# Patient Record
Sex: Female | Born: 1950 | Race: White | Hispanic: No | Marital: Married | Smoking: Former smoker
Health system: Southern US, Community
[De-identification: ages and names within clinical notes are randomized; demographics above are authoritative.]

## PROBLEM LIST (undated history)

## (undated) DIAGNOSIS — F410 Panic disorder [episodic paroxysmal anxiety] without agoraphobia: Secondary | ICD-10-CM

## (undated) DIAGNOSIS — Z87442 Personal history of urinary calculi: Secondary | ICD-10-CM

## (undated) DIAGNOSIS — R609 Edema, unspecified: Secondary | ICD-10-CM

## (undated) DIAGNOSIS — F0390 Unspecified dementia without behavioral disturbance: Secondary | ICD-10-CM

## (undated) DIAGNOSIS — C50911 Malignant neoplasm of unspecified site of right female breast: Secondary | ICD-10-CM

## (undated) DIAGNOSIS — D649 Anemia, unspecified: Secondary | ICD-10-CM

## (undated) DIAGNOSIS — E785 Hyperlipidemia, unspecified: Secondary | ICD-10-CM

## (undated) DIAGNOSIS — F32A Depression, unspecified: Secondary | ICD-10-CM

## (undated) DIAGNOSIS — R413 Other amnesia: Secondary | ICD-10-CM

## (undated) DIAGNOSIS — B009 Herpesviral infection, unspecified: Secondary | ICD-10-CM

## (undated) DIAGNOSIS — E876 Hypokalemia: Secondary | ICD-10-CM

## (undated) DIAGNOSIS — M797 Fibromyalgia: Secondary | ICD-10-CM

## (undated) DIAGNOSIS — F419 Anxiety disorder, unspecified: Secondary | ICD-10-CM

## (undated) DIAGNOSIS — K439 Ventral hernia without obstruction or gangrene: Secondary | ICD-10-CM

## (undated) DIAGNOSIS — R739 Hyperglycemia, unspecified: Secondary | ICD-10-CM

## (undated) DIAGNOSIS — R0600 Dyspnea, unspecified: Secondary | ICD-10-CM

## (undated) DIAGNOSIS — F329 Major depressive disorder, single episode, unspecified: Secondary | ICD-10-CM

## (undated) DIAGNOSIS — F319 Bipolar disorder, unspecified: Secondary | ICD-10-CM

## (undated) DIAGNOSIS — M8580 Other specified disorders of bone density and structure, unspecified site: Secondary | ICD-10-CM

## (undated) DIAGNOSIS — K7689 Other specified diseases of liver: Secondary | ICD-10-CM

## (undated) DIAGNOSIS — F209 Schizophrenia, unspecified: Secondary | ICD-10-CM

## (undated) DIAGNOSIS — E039 Hypothyroidism, unspecified: Secondary | ICD-10-CM

## (undated) DIAGNOSIS — M1990 Unspecified osteoarthritis, unspecified site: Secondary | ICD-10-CM

## (undated) HISTORY — DX: Edema, unspecified: R60.9

## (undated) HISTORY — PX: ABDOMINAL HYSTERECTOMY: SHX81

## (undated) HISTORY — DX: Herpesviral infection, unspecified: B00.9

## (undated) HISTORY — DX: Hypothyroidism, unspecified: E03.9

## (undated) HISTORY — DX: Other specified disorders of bone density and structure, unspecified site: M85.80

## (undated) HISTORY — DX: Personal history of urinary calculi: Z87.442

## (undated) HISTORY — DX: Bipolar disorder, unspecified: F31.9

## (undated) HISTORY — DX: Anemia, unspecified: D64.9

## (undated) HISTORY — DX: Fibromyalgia: M79.7

## (undated) HISTORY — DX: Anxiety disorder, unspecified: F41.9

## (undated) HISTORY — DX: Ventral hernia without obstruction or gangrene: K43.9

## (undated) HISTORY — DX: Other specified diseases of liver: K76.89

## (undated) HISTORY — DX: Dyspnea, unspecified: R06.00

## (undated) HISTORY — DX: Major depressive disorder, single episode, unspecified: F32.9

## (undated) HISTORY — DX: Panic disorder (episodic paroxysmal anxiety): F41.0

## (undated) HISTORY — DX: Hyperlipidemia, unspecified: E78.5

## (undated) HISTORY — DX: Malignant neoplasm of unspecified site of right female breast: C50.911

## (undated) HISTORY — DX: Hyperglycemia, unspecified: R73.9

## (undated) HISTORY — PX: APPENDECTOMY: SHX54

## (undated) HISTORY — DX: Other amnesia: R41.3

## (undated) HISTORY — DX: Schizophrenia, unspecified: F20.9

## (undated) HISTORY — DX: Hypokalemia: E87.6

## (undated) HISTORY — PX: STOMACH SURGERY: SHX791

## (undated) HISTORY — DX: Depression, unspecified: F32.A

---

## 2006-07-01 ENCOUNTER — Emergency Department: Payer: Self-pay | Admitting: Emergency Medicine

## 2006-08-05 ENCOUNTER — Ambulatory Visit: Payer: Self-pay | Admitting: Gastroenterology

## 2007-05-16 ENCOUNTER — Other Ambulatory Visit: Payer: Self-pay

## 2007-05-17 ENCOUNTER — Inpatient Hospital Stay: Payer: Self-pay | Admitting: Internal Medicine

## 2007-07-30 ENCOUNTER — Emergency Department: Payer: Self-pay | Admitting: Emergency Medicine

## 2009-01-21 DIAGNOSIS — Z87442 Personal history of urinary calculi: Secondary | ICD-10-CM

## 2009-01-21 HISTORY — DX: Personal history of urinary calculi: Z87.442

## 2009-06-22 ENCOUNTER — Ambulatory Visit: Payer: Self-pay | Admitting: Specialist

## 2009-06-29 ENCOUNTER — Ambulatory Visit: Payer: Self-pay | Admitting: Surgery

## 2009-07-03 ENCOUNTER — Ambulatory Visit: Payer: Self-pay | Admitting: Surgery

## 2009-07-10 ENCOUNTER — Ambulatory Visit: Payer: Self-pay | Admitting: Surgery

## 2009-11-13 ENCOUNTER — Emergency Department: Payer: Self-pay | Admitting: Emergency Medicine

## 2011-10-29 ENCOUNTER — Emergency Department: Payer: Self-pay | Admitting: Emergency Medicine

## 2011-10-29 LAB — CBC
HGB: 13.9 g/dL (ref 12.0–16.0)
MCH: 31.5 pg (ref 26.0–34.0)
Platelet: 392 10*3/uL (ref 150–440)
RBC: 4.4 10*6/uL (ref 3.80–5.20)
WBC: 8.6 10*3/uL (ref 3.6–11.0)

## 2011-10-29 LAB — COMPREHENSIVE METABOLIC PANEL
Alkaline Phosphatase: 211 U/L — ABNORMAL HIGH (ref 50–136)
Anion Gap: 13 (ref 7–16)
BUN: 15 mg/dL (ref 7–18)
Bilirubin,Total: 0.5 mg/dL (ref 0.2–1.0)
Calcium, Total: 9.2 mg/dL (ref 8.5–10.1)
Chloride: 108 mmol/L — ABNORMAL HIGH (ref 98–107)
Co2: 20 mmol/L — ABNORMAL LOW (ref 21–32)
EGFR (African American): >60
EGFR (Non-African Amer.): 53 — ABNORMAL LOW
Osmolality: 282 (ref 275–301)
Potassium: 3.2 mmol/L — ABNORMAL LOW (ref 3.5–5.1)
SGOT(AST): 22 U/L (ref 15–37)
SGPT (ALT): 25 U/L (ref 12–78)
Total Protein: 8.5 g/dL — ABNORMAL HIGH (ref 6.4–8.2)

## 2011-10-29 LAB — PROTIME-INR
INR: 1
Prothrombin Time: 13.5 secs (ref 11.5–14.7)

## 2011-10-29 LAB — CK TOTAL AND CKMB (NOT AT ARMC): CK-MB: <0.5 ng/mL — ABNORMAL LOW (ref 0.5–3.6)

## 2012-03-14 ENCOUNTER — Emergency Department: Payer: Self-pay | Admitting: Internal Medicine

## 2013-01-21 DIAGNOSIS — K838 Other specified diseases of biliary tract: Secondary | ICD-10-CM

## 2013-01-21 HISTORY — DX: Other specified diseases of biliary tract: K83.8

## 2013-04-19 ENCOUNTER — Ambulatory Visit: Payer: Self-pay | Admitting: Internal Medicine

## 2013-04-19 LAB — CBC CANCER CENTER
Basophil #: 0.1 x10 3/mm (ref 0.0–0.1)
Basophil %: 1.1 %
EOS PCT: 2.9 %
Eosinophil #: 0.2 x10 3/mm (ref 0.0–0.7)
HCT: 37.4 % (ref 35.0–47.0)
HGB: 12.3 g/dL (ref 12.0–16.0)
LYMPHS ABS: 2.3 x10 3/mm (ref 1.0–3.6)
LYMPHS PCT: 29.7 %
MCH: 29.7 pg (ref 26.0–34.0)
MCHC: 32.9 g/dL (ref 32.0–36.0)
MCV: 90 fL (ref 80–100)
Monocyte #: 0.4 x10 3/mm (ref 0.2–0.9)
Monocyte %: 5.5 %
Neutrophil #: 4.7 x10 3/mm (ref 1.4–6.5)
Neutrophil %: 60.8 %
Platelet: 305 x10 3/mm (ref 150–440)
RBC: 4.15 10*6/uL (ref 3.80–5.20)
RDW: 13.1 % (ref 11.5–14.5)
WBC: 7.7 x10 3/mm (ref 3.6–11.0)

## 2013-04-19 LAB — IRON AND TIBC
IRON SATURATION: 21 %
Iron Bind.Cap.(Total): 201 ug/dL — ABNORMAL LOW (ref 250–450)
Iron: 42 ug/dL — ABNORMAL LOW (ref 50–170)
UNBOUND IRON-BIND. CAP.: 159 ug/dL

## 2013-04-19 LAB — HEPATIC FUNCTION PANEL A (ARMC)
ALBUMIN: 3.3 g/dL — AB (ref 3.4–5.0)
Alkaline Phosphatase: 140 U/L — ABNORMAL HIGH
BILIRUBIN DIRECT: 0.1 mg/dL (ref 0.00–0.20)
Bilirubin,Total: 0.3 mg/dL (ref 0.2–1.0)
SGOT(AST): 26 U/L (ref 15–37)
SGPT (ALT): 46 U/L (ref 12–78)
TOTAL PROTEIN: 7.4 g/dL (ref 6.4–8.2)

## 2013-04-19 LAB — FERRITIN: Ferritin (ARMC): 216 ng/mL (ref 8–388)

## 2013-04-21 ENCOUNTER — Ambulatory Visit: Payer: Self-pay | Admitting: Internal Medicine

## 2013-06-02 ENCOUNTER — Ambulatory Visit: Payer: Self-pay | Admitting: Internal Medicine

## 2013-06-02 LAB — HEPATIC FUNCTION PANEL A (ARMC)
ALBUMIN: 3.4 g/dL (ref 3.4–5.0)
ALK PHOS: 157 U/L — AB
Bilirubin, Direct: 0.1 mg/dL (ref 0.00–0.20)
Bilirubin,Total: 0.3 mg/dL (ref 0.2–1.0)
SGOT(AST): 18 U/L (ref 15–37)
SGPT (ALT): 23 U/L (ref 12–78)
Total Protein: 7.7 g/dL (ref 6.4–8.2)

## 2013-06-02 LAB — CBC CANCER CENTER
BASOS PCT: 1.3 %
Basophil #: 0.1 x10 3/mm (ref 0.0–0.1)
Eosinophil #: 0.4 x10 3/mm (ref 0.0–0.7)
Eosinophil %: 4.6 %
HCT: 37.9 % (ref 35.0–47.0)
HGB: 12.8 g/dL (ref 12.0–16.0)
LYMPHS ABS: 1.6 x10 3/mm (ref 1.0–3.6)
Lymphocyte %: 21.1 %
MCH: 30.7 pg (ref 26.0–34.0)
MCHC: 33.9 g/dL (ref 32.0–36.0)
MCV: 91 fL (ref 80–100)
MONO ABS: 0.3 x10 3/mm (ref 0.2–0.9)
Monocyte %: 3.7 %
Neutrophil #: 5.3 x10 3/mm (ref 1.4–6.5)
Neutrophil %: 69.3 %
PLATELETS: 344 x10 3/mm (ref 150–440)
RBC: 4.18 10*6/uL (ref 3.80–5.20)
RDW: 13.2 % (ref 11.5–14.5)
WBC: 7.7 x10 3/mm (ref 3.6–11.0)

## 2013-06-21 ENCOUNTER — Ambulatory Visit: Payer: Self-pay | Admitting: Internal Medicine

## 2013-09-28 ENCOUNTER — Ambulatory Visit: Payer: Self-pay | Admitting: Internal Medicine

## 2013-11-03 ENCOUNTER — Encounter: Payer: Self-pay | Admitting: Pulmonary Disease

## 2013-11-03 ENCOUNTER — Ambulatory Visit (INDEPENDENT_AMBULATORY_CARE_PROVIDER_SITE_OTHER): Payer: Medicare Other | Admitting: Pulmonary Disease

## 2013-11-03 VITALS — BP 142/74 | HR 74 | Ht 62.0 in | Wt 143.0 lb

## 2013-11-03 DIAGNOSIS — R0602 Shortness of breath: Secondary | ICD-10-CM

## 2013-11-03 DIAGNOSIS — R05 Cough: Secondary | ICD-10-CM

## 2013-11-03 DIAGNOSIS — R059 Cough, unspecified: Secondary | ICD-10-CM

## 2013-11-03 DIAGNOSIS — R1084 Generalized abdominal pain: Secondary | ICD-10-CM

## 2013-11-03 DIAGNOSIS — R1901 Right upper quadrant abdominal swelling, mass and lump: Secondary | ICD-10-CM

## 2013-11-03 DIAGNOSIS — R1900 Intra-abdominal and pelvic swelling, mass and lump, unspecified site: Secondary | ICD-10-CM | POA: Insufficient documentation

## 2013-11-03 DIAGNOSIS — R0600 Dyspnea, unspecified: Secondary | ICD-10-CM | POA: Insufficient documentation

## 2013-11-03 DIAGNOSIS — J984 Other disorders of lung: Secondary | ICD-10-CM | POA: Insufficient documentation

## 2013-11-03 LAB — BASIC METABOLIC PANEL
BUN: 17 mg/dL (ref 6–23)
CALCIUM: 9.1 mg/dL (ref 8.4–10.5)
CO2: 21 mEq/L (ref 19–32)
Chloride: 107 mEq/L (ref 96–112)
Creatinine, Ser: 1 mg/dL (ref 0.4–1.2)
GFR: 61.67 mL/min (ref >60.00)
GLUCOSE: 81 mg/dL (ref 70–99)
Potassium: 4.5 mEq/L (ref 3.5–5.1)
SODIUM: 137 meq/L (ref 135–145)

## 2013-11-03 MED ORDER — ALBUTEROL SULFATE HFA 108 (90 BASE) MCG/ACT IN AERS: 2.0000 | INHALATION_SPRAY | Freq: Four times a day (QID) | RESPIRATORY_TRACT | Status: DC | PRN

## 2013-11-03 NOTE — Assessment & Plan Note (Signed)
She had some mild tenderness in the right upper quadrant and there appeared to be a palpable mass. I reviewed previous images of her abdomen and there has been no prior evidence of clear hepatobiliary disease with the exception of some biliary ductal dilatation on a 2011 CT scan. Because of this I would like to evaluate further with a CT scan of her abdomen.

## 2013-11-03 NOTE — Assessment & Plan Note (Signed)
She describes 4-5 months of shortness of breath with minimal activity and the inability to "get air out". Objectively, she has normal lungs on physical exam, her ambulatory oximetry is normal and recently a chest x-ray was normal. However, she does have restrictive lung disease as seen on recent pulmonary function testing. I explained to her today that the differential diagnosis of restrictive lung diseases lengthy and to further evaluate this she needs a CT scan of her chest first.  However, because of her severe anxiety and feeling that she "cannot get air out" I have a high index of suspicion of vocal cord dysfunction. Less likely is the possibility of asthma.  Plan: -CT chest to evaluate for interstitial lung disease -When necessary albuterol.

## 2013-11-03 NOTE — Assessment & Plan Note (Signed)
Presumably due to GERD. Would like to evaluate for restrictive lung disease/interstitial lung disease as detailed above.  Plan: -Continue Prilosec for now, may need a higher dose

## 2013-11-03 NOTE — Progress Notes (Signed)
Subjective:    Patient ID: Elizabeth [name], female    DOB: 1950-07-10, 63 y.o.   MRN: [id number]  HPI  Elizabeth [name] is here to see me for shortness of breath.  She says taht she can't "breathe out" when she is walking.  She has brathing tests at her PCP's office and her heart checked out but she is still having trouble.    She smoked briefly at age 74, but none afterwards.    She says that this problem has been going on for 5 months now. It came on gradually.  She states that she has trouble going walking and going up and down steps without dyspnea. She feels like she has too much air in her chest and she can't breathe out.  She says that going outside makes the dyspnea worse.  She thinks when her dog is around she feels more short of breath.  She only is around the dog rarely and she can't really tell a direct correlation to exposure to the dog with the dyspnea.  She had endoscopy about two years ago and afterwards she had throat and breathing trouble.  She isn't clear about the details.  She lives in a house with asbestos material which she says is "covered up".  She cleans it frequently but it has some dust.  It does not have a basement or any water damage or mold.    She has had a lot of cough during this time too.  She sometimes produces phlegm which is white in color.  She never has any color in it.    She has never been told that she had a lung problem in the past.  She had a normal childhood without difficulty.  She has no family history of lung disease.    She has acid reflux which is not well controlled by her prilosec daily.  Past Medical History  Diagnosis Date  . Hypertension   . Memory loss   . Dyspnea   . Hypothyroidism   . Edema   . Hyperglycemia   . Osteopenia   . Hyperlipidemia   . Hypokalemia   . Anemia      Family History  Problem Relation Age of Onset  . Asthma Daughter   . Heart disease Father   . Cancer Paternal Grandmother     breast  . Cancer  Paternal Aunt     breast     History   Social History  . Marital Status: Married    Spouse Name: N/A    Number of Children: N/A  . Years of Education: N/A   Occupational History  . Not on file.   Social History Main Topics  . Smoking status: Former Smoker -- 0.50 packs/day for 2 years    Types: Cigarettes    Quit date: 11/03/1973  . Smokeless tobacco: Never Used     Comment: Smoked briefly as a teen- lives in secondhand smoke.   Marland Kitchen Alcohol Use: No  . Drug Use: Not on file  . Sexual Activity: Not on file   Other Topics Concern  . Not on file   Social History Narrative  . No narrative on file     Allergies  Allergen Reactions  . Penicillins     Hives      No outpatient prescriptions prior to visit.   No facility-administered medications prior to visit.      Review of Systems  Constitutional: Negative for fever and unexpected weight change.  HENT: Negative for congestion, dental problem, ear pain, nosebleeds, postnasal drip, rhinorrhea, sinus pressure, sneezing, sore throat and trouble swallowing.   Eyes: Negative for redness and itching.  Respiratory: Positive for cough, chest tightness, shortness of breath and wheezing.   Cardiovascular: Negative for palpitations and leg swelling.  Gastrointestinal: Negative for nausea and vomiting.  Genitourinary: Negative for dysuria.  Musculoskeletal: Negative for joint swelling.  Skin: Negative for rash.  Neurological: Negative for headaches.  Hematological: Does not bruise/bleed easily.  Psychiatric/Behavioral: Negative for dysphoric mood. The patient is not nervous/anxious.        Objective:   Physical Exam Filed Vitals:   11/03/13 1354  BP: 142/74  Pulse: 74  Height: 5\' 2"  (1.575 m)  Weight: 143 lb (64.864 kg)  SpO2: 99%   RA  Gen: , no acute distress HEENT: NCAT, PERRL, EOMi, OP clear, neck supple without masses PULM: CTA B CV: RRR, no mgr, no JVD AB: BS+, soft, nontender, ? hepatomegally vs RUQ to  epigastric mass Ext: warm, no edema, no clubbing, no cyanosis Derm: no rash or skin breakdown Neuro: A&Ox4, CN II-XII intact, strength 5/5 in all 4 extremities Psyche anxious, emotionally labile  September 2015 pulmonary function testing> ratio 79% predicted, FEV1 1 L (49% predicted, FVC 1.27 L (45% predicted), total lung capacity 2.93 L (65% predicted), DLCO not performed      Assessment & Plan:   Dyspnea She describes 4-5 months of shortness of breath with minimal activity and the inability to "get air out". Objectively, she has normal lungs on physical exam, her ambulatory oximetry is normal and recently a chest x-ray was normal. However, she does have restrictive lung disease as seen on recent pulmonary function testing. I explained to her today that the differential diagnosis of restrictive lung diseases lengthy and to further evaluate this she needs a CT scan of her chest first.  However, because of her severe anxiety and feeling that she "cannot get air out" I have a high index of suspicion of vocal cord dysfunction. Less likely is the possibility of asthma.  Plan: -CT chest to evaluate for interstitial lung disease -When necessary albuterol.   Restrictive lung disease See above  Abdominal mass She had some mild tenderness in the right upper quadrant and there appeared to be a palpable mass. I reviewed previous images of her abdomen and there has been no prior evidence of clear hepatobiliary disease with the exception of some biliary ductal dilatation on a 2011 CT scan. Because of this I would like to evaluate further with a CT scan of her abdomen.  Cough Presumably due to GERD. Would like to evaluate for restrictive lung disease/interstitial lung disease as detailed above.  Plan: -Continue Prilosec for now, may need a higher dose   Updated Medication List Outpatient Encounter Prescriptions as of 11/03/2013  Medication Sig  . citalopram (CELEXA) 20 MG tablet Take 20 mg by  mouth daily.  . clonazePAM (KLONOPIN) 1 MG tablet Take 1 mg by mouth 2 (two) times daily.  . ergocalciferol (VITAMIN D2) 50000 UNITS capsule Take 50,000 Units by mouth once a week.  . ferrous fumarate (HEMOCYTE - 106 MG FE) 325 (106 FE) MG TABS tablet Take 1 tablet by mouth daily.  Marland Kitchen levothyroxine (SYNTHROID, LEVOTHROID) 88 MCG tablet Take 88 mcg by mouth daily before breakfast.  . albuterol (PROVENTIL HFA;VENTOLIN HFA) 108 (90 BASE) MCG/ACT inhaler Inhale 2 puffs into the lungs every 6 (six) hours as needed for wheezing or shortness of breath.

## 2013-11-03 NOTE — Patient Instructions (Signed)
We will order a CT scan to see if your lungs are OK and a CT scan of your abdomen to evaluate the abdominal pain Try albuterol 2 puffs every four hours as needed for shortness of breath We will see you back in 2-4 weeks to go over the results

## 2013-11-03 NOTE — Assessment & Plan Note (Signed)
See above

## 2013-11-04 NOTE — Progress Notes (Signed)
Quick Note:  Spoke with pt, she's aware of results. Nothing further needed at this time. ______

## 2013-11-10 ENCOUNTER — Ambulatory Visit: Payer: Self-pay | Admitting: Pulmonary Disease

## 2013-11-10 ENCOUNTER — Telehealth: Payer: Self-pay | Admitting: Pulmonary Disease

## 2013-11-10 NOTE — Telephone Encounter (Signed)
Spoke with pt, advised her that I did not have this morning's ct chest results but that I would be calling her with them as soon as I have them. Had CT done at [facility].  Dr. Lake Bells please advise. Thank you.

## 2013-11-11 NOTE — Telephone Encounter (Signed)
Patient is asking for CT results.  [phone]

## 2013-11-11 NOTE — Telephone Encounter (Signed)
Spoke with pt, advised again that I do not have the results of the CT yet but I would send another message to BQ requesting the results and call her with them as soon as I have them.  Dr. Lake Bells please advise on pt's CT chest results.  Thank you.

## 2013-11-11 NOTE — Telephone Encounter (Signed)
Pt is wanting CT results & can be reached at [phone].  Satira Anis

## 2013-11-12 ENCOUNTER — Encounter: Payer: Self-pay | Admitting: Pulmonary Disease

## 2013-11-12 NOTE — Telephone Encounter (Signed)
Please let her know that the CT of the chest was fine but the CT of her abdomen showed that her bile ducts are dilated and she needs to discuss this with her PCP.  From what I can tell, the bile duct problem is no different than a previous study which is good.  There was no mass

## 2013-11-12 NOTE — Telephone Encounter (Signed)
Patient calling back again asking for CT results.  [phone]

## 2013-11-12 NOTE — Telephone Encounter (Signed)
Spoke with the pt and notified of recs per Dr Lake Bells  She verbalized understanding  Nothing further needed

## 2013-11-12 NOTE — Telephone Encounter (Signed)
Spoke with pt, explained again that I do not have the results yet for her CT but would send another message to BQ requesting them and contact her as soon as I have them.  She was confused as to why I couldn't just tell her the results, and I explained that [facility] is on a different computer system than our office is and that I do not have computer access to [facility] files.  Pt understands.    Dr. Lake Bells please advise on pt's ct chest results.  Thank you.

## 2013-11-15 LAB — CBC WITH DIFFERENTIAL/PLATELET
Basophil #: 0.1 10*3/uL (ref 0.0–0.1)
Basophil %: 1.1 %
Eosinophil #: 0.3 10*3/uL (ref 0.0–0.7)
Eosinophil %: 5.3 %
HCT: 39.7 % (ref 35.0–47.0)
HGB: 13.1 g/dL (ref 12.0–16.0)
LYMPHS ABS: 1.5 10*3/uL (ref 1.0–3.6)
Lymphocyte %: 24.7 %
MCH: 31.1 pg (ref 26.0–34.0)
MCHC: 33 g/dL (ref 32.0–36.0)
MCV: 95 fL (ref 80–100)
MONO ABS: 0.2 x10 3/mm (ref 0.2–0.9)
MONOS PCT: 3.8 %
NEUTROS PCT: 65.1 %
Neutrophil #: 3.9 10*3/uL (ref 1.4–6.5)
Platelet: 332 10*3/uL (ref 150–440)
RBC: 4.2 10*6/uL (ref 3.80–5.20)
RDW: 12.9 % (ref 11.5–14.5)
WBC: 5.9 10*3/uL (ref 3.6–11.0)

## 2013-11-15 LAB — COMPREHENSIVE METABOLIC PANEL
Albumin: 3.6 g/dL (ref 3.4–5.0)
Alkaline Phosphatase: 136 U/L — ABNORMAL HIGH
Anion Gap: 10 (ref 7–16)
BILIRUBIN TOTAL: 0.3 mg/dL (ref 0.2–1.0)
BUN: 11 mg/dL (ref 7–18)
CALCIUM: 8.9 mg/dL (ref 8.5–10.1)
CO2: 23 mmol/L (ref 21–32)
Chloride: 109 mmol/L — ABNORMAL HIGH (ref 98–107)
Creatinine: 1.07 mg/dL (ref 0.60–1.30)
EGFR (Non-African Amer.): 55 — ABNORMAL LOW
Glucose: 92 mg/dL (ref 65–99)
OSMOLALITY: 282 (ref 275–301)
Potassium: 4 mmol/L (ref 3.5–5.1)
SGOT(AST): 17 U/L (ref 15–37)
SGPT (ALT): 19 U/L
Sodium: 142 mmol/L (ref 136–145)
Total Protein: 8.1 g/dL (ref 6.4–8.2)

## 2013-11-15 LAB — URINALYSIS, COMPLETE
BACTERIA: NONE SEEN
Bilirubin,UR: NEGATIVE
GLUCOSE, UR: NEGATIVE mg/dL (ref 0–75)
Ketone: NEGATIVE
Nitrite: NEGATIVE
Ph: 7 (ref 4.5–8.0)
Protein: NEGATIVE
RBC,UR: 1 /HPF (ref 0–5)
Specific Gravity: 1.004 (ref 1.003–1.030)
Squamous Epithelial: <1
WBC UR: 6 /HPF (ref 0–5)

## 2013-11-15 LAB — LIPASE, BLOOD: Lipase: 145 U/L (ref 73–393)

## 2013-11-16 LAB — CBC WITH DIFFERENTIAL/PLATELET
BASOS ABS: 0.1 10*3/uL (ref 0.0–0.1)
Basophil %: 1.3 %
Eosinophil #: 0.4 10*3/uL (ref 0.0–0.7)
Eosinophil %: 5.8 %
HCT: 34.4 % — AB (ref 35.0–47.0)
HGB: 11.2 g/dL — ABNORMAL LOW (ref 12.0–16.0)
Lymphocyte #: 1.6 10*3/uL (ref 1.0–3.6)
Lymphocyte %: 24.3 %
MCH: 31.2 pg (ref 26.0–34.0)
MCHC: 32.6 g/dL (ref 32.0–36.0)
MCV: 96 fL (ref 80–100)
Monocyte #: 0.4 x10 3/mm (ref 0.2–0.9)
Monocyte %: 6.2 %
NEUTROS ABS: 4.1 10*3/uL (ref 1.4–6.5)
Neutrophil %: 62.4 %
Platelet: 284 10*3/uL (ref 150–440)
RBC: 3.59 10*6/uL — ABNORMAL LOW (ref 3.80–5.20)
RDW: 12.7 % (ref 11.5–14.5)
WBC: 6.7 10*3/uL (ref 3.6–11.0)

## 2013-11-16 LAB — COMPREHENSIVE METABOLIC PANEL
ALBUMIN: 2.7 g/dL — AB (ref 3.4–5.0)
ALK PHOS: 100 U/L
ALT: 12 U/L — AB
Anion Gap: 6 — ABNORMAL LOW (ref 7–16)
BILIRUBIN TOTAL: 0.4 mg/dL (ref 0.2–1.0)
BUN: 9 mg/dL (ref 7–18)
CALCIUM: 7.6 mg/dL — AB (ref 8.5–10.1)
CO2: 29 mmol/L (ref 21–32)
Chloride: 109 mmol/L — ABNORMAL HIGH (ref 98–107)
Creatinine: 1.18 mg/dL (ref 0.60–1.30)
EGFR (African American): 60 — ABNORMAL LOW
GFR CALC NON AF AMER: 49 — AB
Glucose: 98 mg/dL (ref 65–99)
Osmolality: 285 (ref 275–301)
Potassium: 4.1 mmol/L (ref 3.5–5.1)
SGOT(AST): 12 U/L — ABNORMAL LOW (ref 15–37)
Sodium: 144 mmol/L (ref 136–145)
TOTAL PROTEIN: 6.1 g/dL — AB (ref 6.4–8.2)

## 2013-11-17 HISTORY — PX: CHOLECYSTECTOMY OPEN: SUR202

## 2013-11-18 ENCOUNTER — Inpatient Hospital Stay: Payer: Self-pay | Admitting: Surgery

## 2013-11-18 LAB — COMPREHENSIVE METABOLIC PANEL
ALT: 54 U/L
ANION GAP: 10 (ref 7–16)
Albumin: 2.8 g/dL — ABNORMAL LOW (ref 3.4–5.0)
Alkaline Phosphatase: 96 U/L
BILIRUBIN TOTAL: 0.4 mg/dL (ref 0.2–1.0)
BUN: 10 mg/dL (ref 7–18)
CO2: 24 mmol/L (ref 21–32)
CREATININE: 1.05 mg/dL (ref 0.60–1.30)
Calcium, Total: 8.1 mg/dL — ABNORMAL LOW (ref 8.5–10.1)
Chloride: 104 mmol/L (ref 98–107)
EGFR (African American): >60
EGFR (Non-African Amer.): 56 — ABNORMAL LOW
Glucose: 206 mg/dL — ABNORMAL HIGH (ref 65–99)
OSMOLALITY: 281 (ref 275–301)
POTASSIUM: 4.2 mmol/L (ref 3.5–5.1)
SGOT(AST): 51 U/L — ABNORMAL HIGH (ref 15–37)
Sodium: 138 mmol/L (ref 136–145)
Total Protein: 6.7 g/dL (ref 6.4–8.2)

## 2013-11-18 LAB — CBC WITH DIFFERENTIAL/PLATELET
BASOS PCT: 0.1 %
Basophil #: 0 10*3/uL (ref 0.0–0.1)
EOS ABS: 0 10*3/uL (ref 0.0–0.7)
Eosinophil %: 0 %
HCT: 36 % (ref 35.0–47.0)
HGB: 11.7 g/dL — ABNORMAL LOW (ref 12.0–16.0)
Lymphocyte #: 0.6 10*3/uL — ABNORMAL LOW (ref 1.0–3.6)
Lymphocyte %: 3.5 %
MCH: 31 pg (ref 26.0–34.0)
MCHC: 32.6 g/dL (ref 32.0–36.0)
MCV: 95 fL (ref 80–100)
MONOS PCT: 4.4 %
Monocyte #: 0.8 x10 3/mm (ref 0.2–0.9)
NEUTROS ABS: 15.7 10*3/uL — AB (ref 1.4–6.5)
Neutrophil %: 92 %
PLATELETS: 297 10*3/uL (ref 150–440)
RBC: 3.78 10*6/uL — ABNORMAL LOW (ref 3.80–5.20)
RDW: 12.4 % (ref 11.5–14.5)
WBC: 17.1 10*3/uL — ABNORMAL HIGH (ref 3.6–11.0)

## 2013-11-20 LAB — CBC WITH DIFFERENTIAL/PLATELET
Basophil #: 0 10*3/uL (ref 0.0–0.1)
Basophil %: 0.2 %
EOS ABS: 0 10*3/uL (ref 0.0–0.7)
EOS PCT: 0.3 %
HCT: 29.4 % — AB (ref 35.0–47.0)
HGB: 9.8 g/dL — ABNORMAL LOW (ref 12.0–16.0)
LYMPHS PCT: 7.8 %
Lymphocyte #: 0.7 10*3/uL — ABNORMAL LOW (ref 1.0–3.6)
MCH: 31.3 pg (ref 26.0–34.0)
MCHC: 33.5 g/dL (ref 32.0–36.0)
MCV: 93 fL (ref 80–100)
Monocyte #: 0.7 x10 3/mm (ref 0.2–0.9)
Monocyte %: 7 %
NEUTROS ABS: 8.1 10*3/uL — AB (ref 1.4–6.5)
Neutrophil %: 84.7 %
PLATELETS: 299 10*3/uL (ref 150–440)
RBC: 3.14 10*6/uL — ABNORMAL LOW (ref 3.80–5.20)
RDW: 12.7 % (ref 11.5–14.5)
WBC: 9.6 10*3/uL (ref 3.6–11.0)

## 2013-11-20 LAB — BASIC METABOLIC PANEL
ANION GAP: 7 (ref 7–16)
BUN: 8 mg/dL (ref 7–18)
CALCIUM: 7.8 mg/dL — AB (ref 8.5–10.1)
CHLORIDE: 110 mmol/L — AB (ref 98–107)
Co2: 27 mmol/L (ref 21–32)
Creatinine: 0.91 mg/dL (ref 0.60–1.30)
EGFR (African American): >60
Glucose: 130 mg/dL — ABNORMAL HIGH (ref 65–99)
OSMOLALITY: 287 (ref 275–301)
Potassium: 4 mmol/L (ref 3.5–5.1)
SODIUM: 144 mmol/L (ref 136–145)

## 2013-11-26 ENCOUNTER — Encounter: Payer: Self-pay | Admitting: Pulmonary Disease

## 2013-12-21 DIAGNOSIS — K838 Other specified diseases of biliary tract: Secondary | ICD-10-CM | POA: Insufficient documentation

## 2014-01-21 DIAGNOSIS — D649 Anemia, unspecified: Secondary | ICD-10-CM

## 2014-01-21 DIAGNOSIS — K810 Acute cholecystitis: Secondary | ICD-10-CM

## 2014-01-21 HISTORY — DX: Anemia, unspecified: D64.9

## 2014-01-21 HISTORY — DX: Acute cholecystitis: K81.0

## 2014-04-20 ENCOUNTER — Telehealth: Payer: Self-pay | Admitting: Pulmonary Disease

## 2014-04-20 NOTE — Telephone Encounter (Signed)
Pt on recall list & note states pt to see BQ after CT. Is it ok to schedule appt now?

## 2014-04-20 NOTE — Telephone Encounter (Signed)
Patient had CT done 11/10/2013 but never called back to reschedule appointment. Ok to schedule. Thanks.

## 2014-04-22 NOTE — Telephone Encounter (Signed)
lmtcb x1 for pt. 

## 2014-04-22 NOTE — Telephone Encounter (Signed)
Pt called back.  Pt refused to schedule an appointment. Pt states this is not medically necessary and she is out of town at the moment.

## 2014-05-14 NOTE — Op Note (Signed)
PATIENT NAME:  Elizabeth [name], SKAFF MR#:  [id number] DATE OF BIRTH:  28-Sep-1950  DATE OF PROCEDURE:  11/17/2013   PREOPERATIVE DIAGNOSIS:  Acute calculus cholecystitis.   POSTOPERATIVE DIAGNOSIS:  Acute calculus cholecystitis.   PROCEDURE PERFORMED:  Open cholecystectomy with repair of bile duct.   SURGEON:  Travaughn Vue A. Marina Gravel, MD  ASSISTANT:  Lew Dawes. Oaks, MD   TYPE OF ANESTHESIA:  General endotracheal.   INDICATIONS:  This is a 64 year old white female who has had multiple previous abdominal operations. 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  She has a long right sided paramedian incision. Records are unavailable to me at this time. Workup has demonstrated chronically dilated ducts and a large duodenal diverticulum around the ampulla. HIDA scan performed demonstrates nonvisualization of the gallbladder. The patient has had continued pain and prior history of biliary colic. I discussed with her and her family extensively cholecystectomy with an attempted laparoscopic cholecystectomy. All of their questions were answered. They understand the risk of needing an open operation, the high likelihood of such, and the risk of bile duct injury and repair, bile duct leak, bleeding, and infection.   FINDINGS:  The gallbladder was markedly distended and filled with sludge and inspissated bile. The common bile duct was in a very aberrant location anterior to the duodenum. A small, 1 mm rent was discovered, likely secondary to a traction injury, which was closed with #5-0 PDS suture.   SPECIMENS:  Gallbladder with contents.   Drain:  19 mm blake in morrison's pouch.  DESCRIPTION OF PROCEDURE:  With informed consent, supine position, and general endotracheal anesthesia, the patient's abdomen was widely prepped utilizing ChloraPrep solution. Timeout was observed. Open technique was used to place a transversely-oriented skin incision through the  umbilicus with stay sutures being passed through the fascia. Pneumoperitoneum was established. There were dense adhesions of the omentum and stomach to the anterior abdominal wall. Two 5 mm trocars were placed in the left upper quadrant to take these adhesions down, and this was accomplished with sharp technique and point cautery on punctate bleeding. The gastric mid body was densely adherent to the anterior abdominal wall just underneath this right paramedian incision, which was taken down with sharp dissection and no evidence of gastrotomy. Further dissection demonstrated the gallbladder to be present in the right upper quadrant; however, dissection approximately 45 minutes into the operation failed to demonstrate any clear evidence of Hartmann pouch. Given her history of unclear anatomy and dilated ducts I felt further attempts at laparoscopic dissection unwise.  At this point an open operation was performed, and as such, a right upper quadrant Kocher incision was fashioned with a scalpel and electrocautery through musculofascial layers. A self-retaining abdominal retractor was placed. Two laparotomy tapes were then placed behind the right lobe of the liver. The gallbladder was taken down in a dome down fashion. The cystic artery was doubly ligated with silk suture. A cholangiogram was attempted, however, could only fill the duodenal diverticulum  cystic duct common bile duct junction was identified. There was filling into the duodenum. The cystic duct and common bile duct junction was identified. During the process of this, I discovered a small amount of leak on the anterior surface of the duodenum of clear bile, and this was inspected more closely. In fact, a large 1 cm bile duct was anterior to the duodenum in a very aberrant location, and a small, 1 mm defect was closed  with three 5-0 PDS sutures. The repair appeared to be intact. The cystic duct was transected utilizing a single fire of the 35 mm blue load  stapler and submitted as specimen.   The right upper quadrant was irrigated thoroughly, and hemostasis was ensured on the operative field, especially around the cystic duct remnant with a 3-0 silk suture in figure-of-eight orientation. Surgiflo 10 mL with thrombin application was inserted into the gallbladder fossa and cystic duct remnant. A small amount of fat from the stomach was placed over the bile duct repair and secured to the duodenum.   A 19 mm Blake drain was directed into Morison pouch and exited the right lower quadrant.   The infraumbilical fascial defect was reapproximated utilizing the existing stay sutures. Running #1 PDS looped was used in a single layer to reapproximate the fascial edges. Drain suture was secured with nylon suture. Skin edges were reapproximated utilizing a skin stapler. The remaining local anesthesia totaling 30 mL was infiltrated along all skin and fascial incisions prior to closure. The patient was subsequently extubated and taken to the recovery room in stable and satisfactory condition by anesthesia services.    ____________________________ Jeannette How Marina Gravel, MD mab:nb D: 11/18/2013 16:58:00 ET T: 11/19/2013 03:29:22 ET JOB#: [id number]  cc: Elta Guadeloupe A. Marina Gravel, MD, <Dictator> Hortencia Conradi MD ELECTRONICALLY SIGNED 11/21/2013 18:05

## 2014-05-14 NOTE — H&P (Signed)
Subjective/Chief Complaint abdominal pain, nausea   History of Present Illness 64 y/o female with bipolar disorder presents with 5 days of persistent RUQ pain, recent 30-40 lb weight loss which she reports as intentional.  CT scan 5 days ago showed chronically dilated intra- and extrahepatic bile ducts.  [place] today shows sludge and dilated bile ducts.  SO claims she had two small gallstones several years ago.   Past History bipolar d/o chronic constipation hysterectomy c section surgery for intestinal blockage at age 13 days, ? 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   Primary Physician Rosario Jacks.   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:   C-Section:   Hysterectomy - Total:   ALLERGIES:  Penicillin: Rash   Other Allergies none   HOME MEDICATIONS: Medication Instructions Status  atorvastatin 20 mg oral tablet 1 tab(s) orally once a day (at bedtime) Active  clonazepam 1 mg oral tablet 1 tab(s) orally 2 times a day Active  citalopram 20 mg oral tablet 1 tab(s) orally once a day Active  ferrous sulfate 325 mg oral delayed release tablet 1 tab(s) orally once a day Active  levothyroxine 88 mcg (0.088 mg) oral capsule 1 cap(s) orally once a day Active  Vitamin D3 1000 intl units oral tablet, chewable 1 tab(s) orally once a day Active   Family and Social History:  Family History Non-Contributory   Social History negative tobacco, negative ETOH, negative Illicit drugs   Place of Living Home   Review of Systems:  Subjective/Chief Complaint see above.   Abdominal Pain Yes   Nausea/Vomiting Yes   Dysuria No   Tolerating Diet No  Nauseated   Medications/Allergies Reviewed Medications/Allergies reviewed   Physical Exam:  GEN no acute distress, thin, disheveled   HEENT pale conjunctivae, PERRL, hearing intact to voice   NECK supple   RESP normal resp effort   clear BS   CARD regular rate   ABD denies tenderness  no liver/spleen enlargement  soft  multiple scars, no obvious hernia   LYMPH negative neck   EXTR negative cyanosis/clubbing   SKIN normal to palpation   NEURO cranial nerves intact   PSYCH good insight, anxious, agitated   Lab Results: Hepatic:  26-Oct-15 10:46   Bilirubin, Total 0.3  Alkaline Phosphatase  136 (46-116 NOTE: New Reference Range 08/10/13)  SGPT (ALT) 19 (14-63 NOTE: New Reference Range 08/10/13)  SGOT (AST) 17  Total Protein, Serum 8.1  Albumin, Serum 3.6  Routine Chem:  26-Oct-15 10:46   Glucose, Serum 92  BUN 11  Creatinine (comp) 1.07  Sodium, Serum 142  Potassium, Serum 4.0  Chloride, Serum  109  CO2, Serum 23  Calcium (Total), Serum 8.9  Osmolality (calc) 282  eGFR (African American) >60  eGFR (Non-African American)  55 (eGFR values <99m/min/1.73 m2 may be an indication of chronic kidney disease (CKD). Calculated eGFR, using the MRDR Study equation, is useful in  patients with stable renal function. The eGFR calculation will not be reliable in acutely ill patients when serum creatinine is changing rapidly. It is not useful in patients on dialysis. The eGFR calculation may not be applicable to patients at the low and high extremes of body sizes, pregnant women, and vetetarians.)  Anion Gap 10  Lipase 145 (Result(s) reported on 15 Nov 2013 at 11:18AM.)  Routine UA:  26-Oct-15 10:46   Color (UA) Straw  Clarity (UA) Clear  Glucose (UA) Negative  Bilirubin (UA) Negative  Ketones (UA) Negative  Specific Gravity (UA) 1.004  Blood (UA) 1+  pH (UA) 7.0  Protein (UA) Negative  Nitrite (UA) Negative  Leukocyte Esterase (UA) 1+ (Result(s) reported on 15 Nov 2013 at 11:27AM.)  RBC (UA) 1 /HPF  WBC (UA) 6 /HPF  Bacteria (UA) NONE SEEN  Epithelial Cells (UA) <1 /HPF (Result(s) reported on 15 Nov 2013 at 11:27AM.)  Routine Hem:  26-Oct-15 10:46   WBC (CBC) 5.9  RBC (CBC) 4.20   Hemoglobin (CBC) 13.1  Hematocrit (CBC) 39.7  Platelet Count (CBC) 332  MCV 95  MCH 31.1  MCHC 33.0  RDW 12.9  Neutrophil % 65.1  Lymphocyte % 24.7  Monocyte % 3.8  Eosinophil % 5.3  Basophil % 1.1  Neutrophil # 3.9  Lymphocyte # 1.5  Monocyte # 0.2  Eosinophil # 0.3  Basophil # 0.1 (Result(s) reported on 15 Nov 2013 at 11:30AM.)   Radiology Results: [place]:    26-Oct-15 13:41, US Abdomen Limited Survey  US Abdomen Limited Survey  REASON FOR EXAM:    ruq pain  COMMENTS:   Body Site: Gallbladder, Liver, Common Bile Duct    PROCEDURE: [place]  - US ABDOMEN LIMITED SURVEY  - Nov 15 2013  1:41PM     CLINICAL DATA:  Initial evaluation for excruciating right upper  quadrant pain for 5 days, 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 There is a positive  sonographic Murphy's sign. There is abundant non shadowing material  within the gallbladder.    Common bile duct:    Diameter: Common bile duct is between 11 and 16 mm. There are  dilated intrahepatic and extrahepatic bile ducts.    Liver:    There is intrahepatic biliary dilatation. There is a 3.2 cm simple  cyst in the right lobe of the liver.   IMPRESSION:  Positive sonographic Murphy sign with mild gallbladder wall  thickening and abundant sludge within the gallbladder lumen.  Findingsare concerning for cholecystitis.    Chronic dilatation of the extrahepatic and intrahepatic bile ducts  as increased when compared to prior studies. Obstructing lesion in  the region of the pancreatic head or distal common bile duct not  excluded. As suggested in the reported recent CT scan, MRCP may be  helpful.      Electronically Signed    By: Skipper Cliche M.D.    On: 11/15/2013 14:03     Verified By: Rachael Fee, M.D.,   LabUnknown:  PACS Image  CT:    21-Oct-15 10:59, CT Abdomen With Contrast  CT Abdomen With Contrast  REASON FOR EXAM:    SOB Dr requested CT chest WO Gen abd pain  COMMENTS:       PROCEDURE: CT  - CT ABDOMEN STANDARD W  - Nov 10 2013 10:59AM     CLINICAL DATA:  Shortness of breath, right upper quadrant abdominal  pain, and constipation x5 months. History of benign gastric tumor  resected years ago.    EXAM:  CT CHEST WITHOUT CONTRAST    CT ABDOMEN WITH CONTRAST    TECHNIQUE:  Multidetector CT imaging of the chest was performed following the  standard protocol without intravenous contrast. Multidetector CT  imaging of the abdomen was performed following the standard protocol  during bolus administration of intravenous contrast.    CONTRAST:  85 mL Isovue 300 IV    COMPARISON:  None.    FINDINGS:  CT CHEST FINDINGS    Lungs are clear. No suspicious pulmonary nodules. No pleural  effusion or pneumothorax.  Visualized thyroid is unremarkable.    Heart is normal in size. No pericardial effusion. Mild coronary  atherosclerosis in the LAD (series 2/image 31) and right coronary  artery (series 2/image 37).    No suspicious mediastinal or axillary lymphadenopathy.    Mild degenerative changes of the thoracic spine.    CT ABDOMEN FINDINGS    Hepatobiliary: Liver is notable for a 3.0 x 2.5 cm cyst inferiorly  in the posterior segment right hepatic lobe (series 2/ image 27).  Gallbladder is unremarkable. Mild intrahepatic ductal dilatation.  Dilated common duct, measuring 10 mm, which abruptly tapers at the  ampulla/distal CBD.    Pancreas: Within normal limits. No associated main pancreaticductal  dilatation.    Spleen: Within normal limits.    Adrenals/Urinary Tract: 13 x 12 mm right adrenal adenoma (series 2/  image 22). Left adrenal gland is within normal limits.    Bilateral kidneys are unremarkable.  No hydronephrosis.    Stomach/Bowel: Stomach is  unremarkable.  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    Visualized bowel is otherwise unremarkable.    Vascular/Lymphatic: No evidence of abdominal aortic aneurysm.    No suspicious abdominal lymphadenopathy.    Other: No abdominal ascites.    Musculoskeletal: Mild degenerative changes of the visualized lumbar  spine.     IMPRESSION:  Mild intrahepatic and extrahepatic ductal dilatation. Common duct  measures 10 mm and abruptly tapers at the ampulla. No obstructing  mass is visualized.    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    Correlate with LFTs; if abnormal, consider ERCP or MRCP to assess  for nonvisualized obstructing mass or distal CBD stricture or stone.  If normal, this appearance may be chronic or related to extrinsic  compression by the dominant duodenum diverticulum.    13 mm right adrenal adenoma, benign.    Age advanced coronary atherosclerosis.  Electronically Signed    By: Julian Hy M.D.    On: 11/10/2013 14:19         Verified By: Julian Hy, M.D.,    21-Oct-15 10:59, CT Chest Without Contrast  CT Chest Without Contrast  REASON FOR EXAM:    SOB Dr requested CT chest WO Gen abd pain  COMMENTS:       PROCEDURE: CT  - CT CHEST WITHOUT CONTRAST  - Nov 10 2013 10:59AM     CLINICAL DATA:  Shortness of breath, right upper quadrant abdominal  pain, and constipation x5 months. History of benign gastric tumor  resected years ago.    EXAM:  CT CHEST WITHOUT CONTRAST    CT ABDOMEN WITH CONTRAST    TECHNIQUE:  Multidetector CT imaging of the chest was performed following the  standard protocol without intravenous contrast. Multidetector CT  imaging of the abdomen was performed following the standard protocol  during bolus administration of intravenous contrast.    CONTRAST:  85 mL Isovue 300 IV    COMPARISON:  None.    FINDINGS:  CT CHEST FINDINGS    Lungs are clear.  No suspicious pulmonary nodules. No pleural  effusion or pneumothorax.  Visualized thyroid is unremarkable.    Heart is normal in size. No pericardial effusion. Mild coronary  atherosclerosis in the LAD (series 2/image 31) and right coronary  artery (series 2/image 37).    No suspicious mediastinal or axillary lymphadenopathy.    Mild degenerative changes of the thoracic spine.    CT ABDOMEN FINDINGS    Hepatobiliary: Liver is notable for a 3.0 x 2.5 cm cyst inferiorly  in the posterior segment right hepatic lobe (series 2/ image 27).  Gallbladder is unremarkable. Mild intrahepatic ductal dilatation.  Dilated common duct, measuring 10 mm, which abruptly tapers at the  ampulla/distal CBD.    Pancreas: Within normal limits. No associated main pancreatic ductal  dilatation.    Spleen: Within normal limits.    Adrenals/Urinary Tract: 13 x 12 mm right adrenal adenoma (series 2/  image 22). Left adrenal gland is within normal limits.    Bilateral kidneys are unremarkable.  No hydronephrosis.    Stomach/Bowel: Stomach is unremarkable.  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    Visualized bowel is otherwise unremarkable.    Vascular/Lymphatic: No evidence of abdominal aortic aneurysm.    No suspicious abdominallymphadenopathy.    Other: No abdominal ascites.    Musculoskeletal: Mild degenerative changes of the visualized lumbar  spine.     IMPRESSION:  Mild intrahepatic and extrahepatic ductal dilatation. Common duct  measures 10 mm and abruptly tapers at the ampulla. No obstructing  mass is visualized.    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    Correlate with LFTs; if abnormal, consider ERCP or MRCP to assess  for nonvisualized obstructing mass or distal CBD stricture or stone.  If normal, this appearance may be chronic or related to extrinsic  compression by the dominant duodenum diverticulum.    13 mm  right adrenal adenoma, benign.    Age advanced coronary atherosclerosis.  Electronically Signed    By: Julian Hy M.D.    On: 11/10/2013 14:19         Verified By: Julian Hy, M.D.,    Assessment/Admission Diagnosis 64 y/o female with RUQ pain, nausea and vomiting for 5 days. normal wbc and lft's chronically dilated bile ducts, likely benign etiology given time course. significant component of anxiety she says she does not want to have surgery unless "somebody knows what they are doing"   Plan Admit, hydrate IV abx HIDA scan and MRCP,  GI consult.   Electronic Signatures: Sherri Rad (MD)  (Signed 26-Oct-15 16:36)  Authored: CHIEF COMPLAINT and HISTORY, PAST MEDICAL/SURGIAL HISTORY, ALLERGIES, Other Allergies, HOME MEDICATIONS, FAMILY AND SOCIAL HISTORY, REVIEW OF SYSTEMS, PHYSICAL EXAM, LABS, Radiology, ASSESSMENT AND PLAN   Last Updated: 26-Oct-15 16:36 by Sherri Rad (MD)

## 2014-05-14 NOTE — Consult Note (Signed)
PATIENT NAME:  BRENDAN, GRUWELL MR#:  [id number] DATE OF BIRTH:  29-May-1950  DATE OF CONSULTATION:  11/16/2013  REFERRING PHYSICIAN:  Sherri Rad, MD CONSULTING PHYSICIAN:  Arther Dames, MD  REASON FOR CONSULTATION: Dilated common bile duct, right upper quadrant pain.  HISTORY OF PRESENT ILLNESS: Ms. Helbig is a 64 year old female with a past medical history notable for anxiety, fibromyalgia and diabetes who presented to the Emergency Room for evaluation of right upper quadrant and epigastric pain. Ms. [place] reports that over the past months to years she has had trouble with intermittent episodes of right upper quadrant pain. These episodes usually occur after eating greasy foods and tend to last for about 30 minutes.   However, this episode that brought her to the Emergency Room was much more severe and persistent than her prior episodes. The pain was not associated with nausea, vomiting, fevers, or chills and did not radiate to her back.   She has not had issues with the pain since she has been in the hospital. Her work-up so far included a CT scan that showed gallbladder wall thickening and also a dilated intra and extrahepatic bile duct with a diverticulum in the small bowel near the ampulla. She also had an ultrasound that also again shows intra and extrahepatic biliary dilation. Today she had a HIDA scan which shows nonvisualization of the gallbladder.   PAST MEDICAL HISTORY: 1.  Bipolar.  2.  Chronic constipation.  3.  Hysterectomy.  4.  Anxiety. 5.  Fibromyalgia. 6.  Diabetes.   PAST SURGICAL HISTORY: Includes: 1.  Hysterectomy.  2.  C-section.  3.  Surgery for intestinal blockage at age 32 days.     ALLERGIES: PENICILLIN.   HOME MEDICATIONS: 1.  Lipitor 20 daily.  2.  Clonazepam 1 mg twice a day.  3.  Citalopram 20 mg daily.  4.  Iron 325 daily.  5.  Levothyroxine 88 mcg daily.  6.  Vitamin D3.  FAMILY HISTORY: No family history of GI malignancy that she is aware of.    SOCIAL HISTORY: She denies tobacco or alcohol to me.   REVIEW OF SYSTEMS: A 10 system review was conducted. It is negative except as stated in the HPI.  PHYSICAL EXAMINATION: VITAL SIGNS: Her temperature is 98.3, pulse is 91, respirations are 17, blood pressure is 108/63 and pulse ox is 95% on room air.  GENERAL: Alert and oriented times 4.  No acute distress. Appears stated age. HEENT: Normocephalic/atraumatic. Extraocular movements are intact. Anicteric. NECK: Soft, supple. JVP appears normal. No adenopathy. CHEST: Clear to auscultation. No wheeze or crackle. Respirations unlabored. HEART: Regular. No murmur, rub, or gallop.  Normal S1 and S2. ABDOMEN: Soft. Significant right upper quadrant tenderness to palpation. Nondistended.  Normal active bowel sounds in all 4 quadrants.  No organomegaly. No masses EXTREMITIES: No swelling, well perfused. SKIN: No rash or lesion. Skin color, texture, turgor normal. NEUROLOGICAL: Grossly intact. PSYCHIATRIC: Normal tone and affect. MUSCULOSKELETAL: No joint swelling or erythema.   DIAGNOSTIC DATA: Sodium 144, potassium 4.1, creatinine 1.18, BUN 9. Lipase normal at 145. Liver enzymes are normal. Albumin is 2.7. Yesterday her alk phos was slightly elevated at 136. CBC: White count 6.7, hemoglobin 11.2, hematocrit 34, platelets  284,000.   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  She also had an ultrasound of the abdomen that shows chronic dilation of the extrahepatic and intrahepatic bile ducts.  She had a HIDA scan today which showed nonvisualization of the gallbladder with prompt excretion of bile into the small bowel.   ASSESSMENT AND PLAN: Right upper quadrant pain, dilated bile ducts: Based on today's HIDA scan, it seems that she may well  have cholecystitis. She also does have dilated intra and extrahepatic dilation with abrupt tapering at the ampulla along with a large diverticulum. I suspect the biliary dilation may be related to some sphincter of Odie dysfunction related to the diverticulum. It does appear that the flow of bile was normal into the bile duct on HIDA scan. I would recommended a MRCP to further investigate and rule out a potential cause of the bile duct dilation and the tapering at the ampulla.  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 Therefore, depending on the MRCP, we may consider ERCP with manometry.   RECOMMENDATIONS: 1.  Management of possible cholecystitis per surgical team.  2.  MRCP to evaluate dilated common bile duct with tapering at the ampulla.  3.  Further recommendations pending the findings on the MRCP and response to management of possible cholecystitis.   Thank you for this consult.   ____________________________ Arther Dames, MD mr:sb D: 11/16/2013 11:51:48 ET T: 11/16/2013 12:23:30 ET JOB#: [id number]  cc: Arther Dames, MD, <Dictator> Mellody Life MD ELECTRONICALLY SIGNED 12/14/2013 15:31

## 2014-05-14 NOTE — Discharge Summary (Signed)
PATIENT NAME:  Elizabeth [name], TIESZEN MR#:  [id number] DATE OF BIRTH:  [birth year]  DATE OF ADMISSION:  11/15/2013 DATE OF DISCHARGE:  11/21/2013  FINAL DIAGNOSIS: Acute cholecystitis.   PRINCIPAL PROCEDURES:  1.  Open cholecystectomy.  2.  HIDA scan.  3.  CT scan of the abdomen.  4.  GI medicine consultation.  5.  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  6.  Duodenal diverticulum. 7.  MRCP.   HOSPITAL COURSE SUMMARY: The patient was admitted with abdominal pain and an unclear picture of acute cholecystitis. 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 She has had multiple previous operations. She was found on CT scan 5 days prior to her admission to have dilated intrahepatic and extrahepatic biliary ducts and an ultrasound on the day of her admission demonstrated sludge and dilated ducts as well. She been told that she has had gallstones in the past. She has a history of chronic constipation and chronic intermittent abdominal pain. The patient was admitted to the hospital. GI medicine did become involved due to her dilated ducts. Recommendation was for outpatient follow-up and MRCP. An MRCP was performed which did demonstrate tapering of the distal duct, but no obvious choledocholithiasis. There was found to be a large duodenal diverticulum, which may account for the abrupt cut off of the ducts seen distally. Similar findings were noted from a 2011 MRCP, which were essentially identical. The patient underwent a HIDA scan. HIDA scan demonstrated nonvisualization of the gallbladder. She was taken to the operating room on the 28th of October with an intent for cholecystectomy and intraoperative cholangiography. Due to multiple adhesions in the right upper quadrant and altered hepatoduodenal anatomy, the patient had an open procedure. Postoperatively, she did well  with adequate pain control. Her drain was left in place and had no bile. The patient was tolerating a regular diet and was transitioned over to oral narcotics. Wound was healing nicely. No bile was seen in the drainage. She was subsequently discharged home on the 1st of November with outpatient follow-up in my office approximately 1 week. Medication reconciliation form was performed. I will see the patient back in the office. Call with any questions or concerns.   ____________________________ Jeannette How Marina Gravel, MD mab:sb D: 12/06/2013 10:35:00 ET T: 12/06/2013 11:40:02 ET JOB#: [id number]  cc: Elta Guadeloupe A. Marina Gravel, MD, <Dictator> Hortencia Conradi MD ELECTRONICALLY SIGNED 12/06/2013 19:36

## 2014-05-16 LAB — SURGICAL PATHOLOGY

## 2014-08-04 ENCOUNTER — Telehealth: Payer: Self-pay | Admitting: Gastroenterology

## 2014-08-04 NOTE — Telephone Encounter (Signed)
Colonoscopy triage referral in media

## 2014-08-09 NOTE — Telephone Encounter (Signed)
LVM for pt to return my call.

## 2014-08-18 ENCOUNTER — Encounter: Payer: Self-pay | Admitting: Urology

## 2014-08-18 ENCOUNTER — Ambulatory Visit (INDEPENDENT_AMBULATORY_CARE_PROVIDER_SITE_OTHER): Payer: Medicare Other | Admitting: Urology

## 2014-08-18 VITALS — BP 110/74 | HR 90 | Ht 62.0 in | Wt 144.3 lb

## 2014-08-18 DIAGNOSIS — D3501 Benign neoplasm of right adrenal gland: Secondary | ICD-10-CM | POA: Insufficient documentation

## 2014-08-18 DIAGNOSIS — R312 Other microscopic hematuria: Secondary | ICD-10-CM

## 2014-08-18 DIAGNOSIS — R3129 Other microscopic hematuria: Secondary | ICD-10-CM | POA: Insufficient documentation

## 2014-08-18 LAB — URINALYSIS, COMPLETE
Bilirubin, UA: NEGATIVE
GLUCOSE, UA: NEGATIVE
KETONES UA: NEGATIVE
LEUKOCYTES UA: NEGATIVE
Nitrite, UA: NEGATIVE
PROTEIN UA: NEGATIVE
Urobilinogen, Ur: 0.2 mg/dL (ref 0.2–1.0)
pH, UA: 5.5 (ref 5.0–7.5)

## 2014-08-18 LAB — MICROSCOPIC EXAMINATION
Bacteria, UA: NONE SEEN
WBC, UA: NONE SEEN /hpf (ref 0–?)

## 2014-08-18 NOTE — Progress Notes (Signed)
08/18/2014 4:38 PM   [place] 06/09/1950 [id number]  Referring provider: Casilda Carls, MD [address]   [place], [place] [address]  Chief Complaint  Patient presents with  . Hematuria    microscopic hematuria, left side intermittent abdominal pain     HPI: 64 year old female sent by Dr. Casilda Carls for evaluation and management of hematuria. She was recently found to have dipstick positive hematuria on routine urinalysis. The patient had been having some left side pain, mainly with moving around. She has no pain when still. It is not colicky in nature. She denies any gross hematuria. She did have a kidney stone about 5 years ago by report she had a CT scan in October 2015 prior to a cholecystectomy which revealed no renal abnormalities.  She denies frequent urinary tract infections. She has no significant lower urinary tract symptomatology. She has a good stream and feels like she empties well. She has not sought urologic consultation in the past.  PMH: Past Medical History  Diagnosis Date  . Hypertension   . Memory loss   . Dyspnea   . Hypothyroidism   . Edema   . Hyperglycemia   . Osteopenia   . Hyperlipidemia   . Hypokalemia   . Anemia   . History of kidney stones 2011    Surgical History: Past Surgical History  Procedure Laterality Date  . Cesarean section    . Stomach surgery    . Abdominal hysterectomy      Home Medications:    Medication List       This list is accurate as of: 08/18/14  4:38 PM.  Always use your most recent med list.               albuterol 108 (90 BASE) MCG/ACT inhaler  Commonly known as:  PROVENTIL HFA;VENTOLIN HFA  Inhale 2 puffs into the lungs every 6 (six) hours as needed for wheezing or shortness of breath.     PROAIR HFA 108 (90 BASE) MCG/ACT inhaler  Generic drug:  albuterol  Inhale into the lungs.     calcium carbonate 1250 (500 CA) MG tablet  Commonly known as:  OS-CAL - dosed in mg of elemental calcium   Take by mouth.     citalopram 20 MG tablet  Commonly known as:  CELEXA  Take by mouth.     clonazePAM 1 MG tablet  Commonly known as:  KLONOPIN  Take 1 mg by mouth 2 (two) times daily.     dicyclomine 10 MG capsule  Commonly known as:  BENTYL  Take by mouth.     Ferrous Fumarate 324 (106 FE) MG Tabs  Take by mouth.     levothyroxine 88 MCG tablet  Commonly known as:  SYNTHROID, LEVOTHROID  Take by mouth.     lubiprostone 24 MCG capsule  Commonly known as:  AMITIZA  Take by mouth.     omeprazole 40 MG capsule  Commonly known as:  PRILOSEC  Take by mouth.     polyethylene glycol packet  Commonly known as:  MIRALAX / GLYCOLAX  Take by mouth.     PROBIOTIC ADVANCED PO  Take by mouth.     Vitamin D (Ergocalciferol) 50000 UNITS Caps capsule  Commonly known as:  DRISDOL  Take by mouth.        Allergies:  Allergies  Allergen Reactions  . Oxycodone-Acetaminophen Hives  . Penicillins Hives    Hives Hives     Family History: Family  History  Problem Relation Age of Onset  . Asthma Daughter   . Heart disease Father   . Cancer Paternal Grandmother     breast  . Cancer Paternal Aunt     breast    Social History:  reports that she quit smoking about 40 years ago. Her smoking use included Cigarettes. She has a 1 pack-year smoking history. She has never used smokeless tobacco. She reports that she does not drink alcohol or use illicit drugs.  ROS: UROLOGY Frequent Urination?: No Hard to postpone urination?: No Burning/pain with urination?: No Get up at night to urinate?: Yes Leakage of urine?: No Urine stream starts and stops?: No Trouble starting stream?: No Do you have to strain to urinate?: Yes Blood in urine?: No Urinary tract infection?: No Sexually transmitted disease?: No Injury to kidneys or bladder?: No Painful intercourse?: No Weak stream?: No Currently pregnant?: No Vaginal bleeding?: No Last menstrual period?:  n  Gastrointestinal Nausea?: No Vomiting?: No Indigestion/heartburn?: Yes Diarrhea?: Yes Constipation?: No  Constitutional Fever: No Night sweats?: No Weight loss?: No Fatigue?: Yes  Skin Skin rash/lesions?: No Itching?: No  Eyes Blurred vision?: No Double vision?: No  Ears/Nose/Throat Sore throat?: No Sinus problems?: No  Hematologic/Lymphatic Swollen glands?: Yes Easy bruising?: Yes  Cardiovascular Leg swelling?: Yes  Respiratory Cough?: Yes Shortness of breath?: Yes  Endocrine Excessive thirst?: Yes  Musculoskeletal Back pain?: No Joint pain?: No  Neurological Headaches?: Yes Dizziness?: No  Psychologic Depression?: Yes Anxiety?: Yes  Physical Exam: BP 110/74 mmHg  Pulse 90  Ht 5\' 2"  (1.575 m)  Wt 144 lb 4.8 oz (65.454 kg)  BMI 26.39 kg/m2  Constitutional:  Alert and oriented, No acute distress. HEENT: [place] AT, moist mucus membranes.  Trachea midline, no masses. Cardiovascular: No clubbing, cyanosis, or edema. Respiratory: Normal respiratory effort, no increased work of breathing. GI: Abdomen is soft, nontender, nondistended, no abdominal masses GU: mild left flank tenderness-no significant CVA tenderness. There is paraspinous muscle tenderness noted. Skin: No rashes, bruises or suspicious lesions. Lymph: No cervical or inguinal adenopathy. Neurologic: Grossly intact, no focal deficits, moving all 4 extremities. Psychiatric: Normal mood and affect.  Laboratory Data: Lab Results  Component Value Date   WBC 9.6 11/20/2013   HGB 9.8* 11/20/2013   HCT 29.4* 11/20/2013   MCV 93 11/20/2013   PLT 299 11/20/2013    Lab Results  Component Value Date   CREATININE 0.91 11/20/2013    No results found for: PSA  No results found for: TESTOSTERONE  No results found for: HGBA1C  Urinalysis    Component Value Date/Time   COLORURINE Straw 11/15/2013 1046   APPEARANCEUR Clear 11/15/2013 1046   LABSPEC 1.004 11/15/2013 1046   PHURINE 7.0  11/15/2013 1046   GLUCOSEU Negative 11/15/2013 1046   HGBUR 1+ 11/15/2013 1046   BILIRUBINUR Negative 11/15/2013 1046   KETONESUR Negative 11/15/2013 1046   PROTEINUR Negative 11/15/2013 1046   NITRITE Negative 11/15/2013 1046   LEUKOCYTESUR 1+ 11/15/2013 1046   Flexible cystoscopy was performed. Urethra was normal. Bladder was inspected circumferentially. Ureteral orifices were normal in configuration and location. Mild squamous metaplasia of the inferior trigonal region was noted, normal variant. No tumors, foreign bodies or trabeculations were noted. The patient tolerated the procedure well. Pertinent Imaging: I reviewed the patient's CT scan transcription/results  Assessment   1.Microscopic hematuria-patient had a normal CT abdomen and pelvis with regards to her upper tracts in October, 2016. At this point, I do not think she needs repeat scanning.  Cystoscopy was performed today.this was normal. 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 I do not think this needs follow-up, noted to be benign in appearance  Plan:  1.the patient was reassured about her CT scan and cystoscopy  2. I will have her come back on a when necessary basis     No Follow-up on file.  Jorja Loa, [facility] [address], [place], [place] [address] [phone]

## 2014-08-31 ENCOUNTER — Encounter: Payer: Self-pay | Admitting: Surgery

## 2014-08-31 ENCOUNTER — Ambulatory Visit (INDEPENDENT_AMBULATORY_CARE_PROVIDER_SITE_OTHER): Payer: Medicare Other | Admitting: Surgery

## 2014-08-31 VITALS — BP 147/84 | HR 76 | Temp 98.4°F | Ht 62.5 in | Wt 146.4 lb

## 2014-08-31 DIAGNOSIS — K432 Incisional hernia without obstruction or gangrene: Secondary | ICD-10-CM

## 2014-08-31 NOTE — Progress Notes (Signed)
Patient ID: Elizabeth [name], female   DOB: 1950-05-01, 64 y.o.   MRN: [id number]  Chief Complaint  Patient presents with  . Ventral Hernia    x 2 weeks    HPI  Elizabeth [name] is a 64 y.o. female.  Who I operated on in October of last year with an open cholecystectomy. 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 Over the last several months she's noted swelling around her midline abdomen. She says that she knows she has a hernia there. She denies any nausea vomiting or pain. She is accompanied by significant other.  Past Medical History  Diagnosis Date  . Memory loss   . Dyspnea   . Hypothyroidism   . Edema   . Hyperglycemia   . Osteopenia   . Hyperlipidemia   . Hypokalemia   . Anemia   . History of kidney stones 2011    Past Surgical History  Procedure Laterality Date  . Cesarean section    . Stomach surgery    . Abdominal hysterectomy    . Cholecystectomy open  11/17/2013    with repair of bile duct-Dr. Marina Gravel    Family History  Problem Relation Age of Onset  . Asthma Daughter   . Heart disease Father   . Cancer Paternal Grandmother     breast  . Cancer Paternal Aunt     breast    Social History Social History  Substance Use Topics  . Smoking status: Former Smoker -- 0.50 packs/day for 2 years    Types: Cigarettes    Quit date: 11/03/1973  . Smokeless tobacco: Never Used     Comment: Smoked briefly as a teen- lives in secondhand smoke.   Marland Kitchen Alcohol Use: No    Allergies  Allergen Reactions  . Oxycodone-Acetaminophen Hives  . Penicillins Hives    Hives Hives     Current Outpatient Prescriptions  Medication Sig Dispense Refill  . albuterol (PROVENTIL HFA;VENTOLIN HFA) 108 (90 BASE) MCG/ACT inhaler Inhale 2 puffs into the lungs every 6 (six) hours as needed for wheezing or shortness of breath. 1 Inhaler 6  . calcium carbonate (OS-CAL - DOSED IN MG OF ELEMENTAL CALCIUM) 1250 (500 CA) MG tablet Take by mouth.      . citalopram (CELEXA) 20 MG tablet Take by mouth.    . clonazePAM (KLONOPIN) 1 MG tablet Take 1 mg by mouth 2 (two) times daily.    Marland Kitchen dicyclomine (BENTYL) 10 MG capsule Take by mouth.    . levothyroxine (SYNTHROID, LEVOTHROID) 88 MCG tablet Take by mouth.    Marland Kitchen omeprazole (PRILOSEC) 40 MG capsule Take by mouth.    . polyethylene glycol (MIRALAX / GLYCOLAX) packet Take by mouth.    . Probiotic Product (PROBIOTIC ADVANCED PO) Take by mouth.    . Vitamin D, Ergocalciferol, (DRISDOL) 50000 UNITS CAPS capsule Take by mouth.     No current facility-administered medications for this visit.     Blood pressure 147/84, pulse 76, temperature 98.4 F (36.9 C), temperature source Oral, height 5' 2.5" (1.588 m), weight 66.407 kg (146 lb 6.4 oz).  Review of Systems  Constitutional: Negative for fever, chills, weight loss and diaphoresis.  HENT: Negative.   Respiratory: Negative for cough, hemoptysis and sputum production.   Cardiovascular: Negative.   Gastrointestinal: Negative for heartburn, nausea, vomiting, abdominal pain and blood in stool.  Genitourinary: Negative for dysuria.  Neurological: Negative.   Endo/Heme/Allergies: Negative.   All other systems reviewed and are negative.  Physical Exam  Constitutional: She is oriented to person, place, and time and well-developed, well-nourished, and in no distress. No distress.  HENT:  Head: Normocephalic and atraumatic.  Eyes: Conjunctivae are normal. Pupils are equal, round, and reactive to light.  Cardiovascular: Normal rate and regular rhythm.   Pulmonary/Chest: No respiratory distress.  Abdominal: Soft. She exhibits mass. There is no tenderness. There is no rebound and no guarding.    Fascial defect is 3 cm. It is at the confluence to scars. It hernia defect is palpable and hernia contents are easily reducible.  There are multiple scars on the abdomen.  Neurological: She is oriented to person, place, and time.  Skin: Skin is warm and  dry. She is not diaphoretic.  Psychiatric: Mood, memory, affect and judgment normal.   Physical Exam CONSTITUTIONAL:  Pleasant, well-developed, well-nourished, and in no acute distress. EYES: Pupils equal and reactive to light, Sclera non-icteric EARS, NOSE, MOUTH AND THROAT:  The oropharynx was clear.  Dentition is good repair.  Oral mucosa pink and moist. LYMPH NODES:  Lymph nodes in the neck and axillae were normal RESPIRATORY:  Lungs were clear.  Normal respiratory effort without pathologic use of accessory muscles of respiration CARDIOVASCULAR: Heart was regular without murmurs.  There were no carotid bruits. GI: The abdomen was soft, nontender, and nondistended. There were no palpable masses. There was no hepatosplenomegaly. There were normal bowel sounds in all quadrants. GU:  Rectal deferred.   MUSCULOSKELETAL:  Normal muscle strength and tone.  No clubbing or cyanosis.   SKIN:  There were no pathologic skin lesions.  There were no nodules on palpation. NEUROLOGIC:  Sensation is normal.  Cranial nerves are grossly intact. PSYCH:  Oriented to person, place and time.  Mood and affect are normal.   Assessment    64 year old female with incisional ventral hernia. She is asymptomatic. She is not interested in repair.    Plan    I will see her back at 6 months and as needed. I discussed with her and her significant other the indications for repair. All of her questions were answered.         Sherri Rad MD, FACS 08/31/2014, 4:09 PM

## 2014-08-31 NOTE — Patient Instructions (Signed)
Follow-up in 6 months. Call our office to schedule your appointment.  If you develop more symptoms, call our office at any time with questions or concerns.

## 2014-09-01 ENCOUNTER — Telehealth: Payer: Self-pay | Admitting: Surgery

## 2014-09-01 ENCOUNTER — Telehealth: Payer: Self-pay

## 2014-09-01 NOTE — Telephone Encounter (Signed)
Returned phone call to patient at this time. Explained that after speaking with Dr. Marina Gravel that we will need to see patient in office during next clinic to go over risks, benefits, and scheduling.   Appointment made for 09/15/14 at 1:15pm. Directions given to [facility] office and she readback all appointment instructions and directions.

## 2014-09-01 NOTE — Telephone Encounter (Signed)
Patient would like to speak with Amber. She has decided to go through with the hernia surgery and would like to speak with Amber about same. Thanks.

## 2014-09-01 NOTE — Telephone Encounter (Signed)
Patient called back in at this time and cancelled appointment to discuss Hernia Repair.  She does not wish to reschedule but will call back if she wishes to have Hernia repaired in future.  Removed from schedule at this time.

## 2014-09-15 ENCOUNTER — Ambulatory Visit: Payer: Self-pay | Admitting: Surgery

## 2014-11-01 ENCOUNTER — Telehealth: Payer: Self-pay | Admitting: Surgery

## 2014-11-01 NOTE — Telephone Encounter (Signed)
Pt has called stating that she is ready to schedule surgery with Dr Marina Gravel. I have read through the notes and seen that the patient will need an appointment with Dr Marina Gravel to discuss risks and benefits before scheduling sx. Appt has been made.

## 2014-11-04 ENCOUNTER — Encounter: Payer: Self-pay | Admitting: Surgery

## 2014-11-04 DIAGNOSIS — F410 Panic disorder [episodic paroxysmal anxiety] without agoraphobia: Secondary | ICD-10-CM | POA: Insufficient documentation

## 2014-11-04 DIAGNOSIS — K810 Acute cholecystitis: Secondary | ICD-10-CM | POA: Insufficient documentation

## 2014-11-04 DIAGNOSIS — D649 Anemia, unspecified: Secondary | ICD-10-CM | POA: Insufficient documentation

## 2014-11-04 DIAGNOSIS — M8580 Other specified disorders of bone density and structure, unspecified site: Secondary | ICD-10-CM | POA: Insufficient documentation

## 2014-11-04 DIAGNOSIS — E876 Hypokalemia: Secondary | ICD-10-CM | POA: Insufficient documentation

## 2014-11-04 DIAGNOSIS — E785 Hyperlipidemia, unspecified: Secondary | ICD-10-CM | POA: Insufficient documentation

## 2014-11-04 DIAGNOSIS — Z8679 Personal history of other diseases of the circulatory system: Secondary | ICD-10-CM | POA: Insufficient documentation

## 2014-11-04 DIAGNOSIS — M797 Fibromyalgia: Secondary | ICD-10-CM | POA: Insufficient documentation

## 2014-11-04 DIAGNOSIS — E039 Hypothyroidism, unspecified: Secondary | ICD-10-CM | POA: Insufficient documentation

## 2014-11-04 DIAGNOSIS — R739 Hyperglycemia, unspecified: Secondary | ICD-10-CM | POA: Insufficient documentation

## 2014-11-04 DIAGNOSIS — I10 Essential (primary) hypertension: Secondary | ICD-10-CM | POA: Insufficient documentation

## 2014-11-04 DIAGNOSIS — E119 Type 2 diabetes mellitus without complications: Secondary | ICD-10-CM | POA: Insufficient documentation

## 2014-11-04 DIAGNOSIS — F419 Anxiety disorder, unspecified: Secondary | ICD-10-CM | POA: Insufficient documentation

## 2014-11-04 DIAGNOSIS — Z8639 Personal history of other endocrine, nutritional and metabolic disease: Secondary | ICD-10-CM | POA: Insufficient documentation

## 2014-11-04 DIAGNOSIS — R609 Edema, unspecified: Secondary | ICD-10-CM | POA: Insufficient documentation

## 2014-11-07 ENCOUNTER — Telehealth: Payer: Self-pay | Admitting: Surgery

## 2014-11-07 NOTE — Telephone Encounter (Signed)
Patient called and said she has appointment Wednesday for hernia but is having bowel seepage in her urine track. A little bit of pain.

## 2014-11-07 NOTE — Telephone Encounter (Signed)
Patient called back. I told her the nurse was still at lunch and I would have him call ASAP. If needed, she should go to the ED. She was concerned about the bill from the ED.

## 2014-11-07 NOTE — Telephone Encounter (Signed)
Returned patient call. Patient reports seeing stool in her urine starting yesterday and again this AM. Patient also reports pain 6 of 10 without pain medication. I directed the patient to go to the ED immediately for treatment. Patient confirmed understanding of direction to go immediately to the ED.

## 2014-11-09 ENCOUNTER — Ambulatory Visit (INDEPENDENT_AMBULATORY_CARE_PROVIDER_SITE_OTHER): Payer: Medicare Other | Admitting: Surgery

## 2014-11-09 ENCOUNTER — Encounter: Payer: Self-pay | Admitting: Surgery

## 2014-11-09 VITALS — BP 125/85 | HR 86 | Temp 98.4°F | Ht 62.0 in | Wt 150.0 lb

## 2014-11-09 DIAGNOSIS — K432 Incisional hernia without obstruction or gangrene: Secondary | ICD-10-CM

## 2014-11-09 NOTE — Progress Notes (Signed)
Patient ID: Elizabeth [name], female   DOB: 11/26/1950, 64 y.o.   MRN: [id number]  Chief Complaint  Patient presents with  . Hernia    ventral hernia surgery consult    HPI  Elizabeth [name] is a 64 y.o. female.  Who underwent an open cholecystectomy for acute cholecystitis. 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 I saw her several months ago with an incisional hernia which was not bothering her now is causing her some pain. She like to have it corrected. She is very concerned about possibly dying from the operation. She had some "stool in her urine" this was evaluated [facility]. It was determined likely secondary to renal stones. She has been seen in the past by urology. There is been no history of incarceration of the hernia.  Past Medical History  Diagnosis Date  . Memory loss   . Dyspnea   . Hypothyroidism   . Edema   . Hyperglycemia   . Osteopenia   . Hyperlipidemia   . Hypokalemia   . Anemia   . History of kidney stones 2011    Past Surgical History  Procedure Laterality Date  . Cesarean section    . Stomach surgery    . Abdominal hysterectomy    . Cholecystectomy open  11/17/2013    with repair of bile duct-Dr. Marina Gravel    Family History  Problem Relation Age of Onset  . Asthma Daughter   . Heart disease Father   . Cancer Paternal Grandmother     breast  . Cancer Paternal Aunt     breast    Social History Social History  Substance Use Topics  . Smoking status: Former Smoker -- 0.50 packs/day for 2 years    Types: Cigarettes    Quit date: 11/03/1973  . Smokeless tobacco: Never Used     Comment: Smoked briefly as a teen- lives in secondhand smoke.   Marland Kitchen Alcohol Use: No    Allergies  Allergen Reactions  . Oxycodone-Acetaminophen Hives  . Penicillins Hives    Hives Hives     Current Outpatient Prescriptions  Medication Sig Dispense Refill  . albuterol (PROVENTIL HFA;VENTOLIN HFA) 108 (90 BASE) MCG/ACT inhaler  Inhale 2 puffs into the lungs every 6 (six) hours as needed for wheezing or shortness of breath. 1 Inhaler 6  . calcium carbonate (OS-CAL - DOSED IN MG OF ELEMENTAL CALCIUM) 1250 (500 CA) MG tablet Take by mouth.    . citalopram (CELEXA) 20 MG tablet Take by mouth.    . clonazePAM (KLONOPIN) 1 MG tablet Take 1 mg by mouth 2 (two) times daily.    Marland Kitchen levothyroxine (SYNTHROID, LEVOTHROID) 88 MCG tablet Take by mouth.    Marland Kitchen omeprazole (PRILOSEC) 40 MG capsule Take by mouth.    . polyethylene glycol (MIRALAX / GLYCOLAX) packet Take by mouth.    . Probiotic Product (PROBIOTIC ADVANCED PO) Take by mouth.    . Vitamin D, Ergocalciferol, (DRISDOL) 50000 UNITS CAPS capsule Take by mouth.     No current facility-administered medications for this visit.    Blood pressure 125/85, pulse 86, temperature 98.4 F (36.9 C), temperature source Oral, height 5\' 2"  (1.575 m), weight 150 lb (68.04 kg).  No results found for this or any previous visit (from the past 48 hour(s)). No results found.  Review of Systems  Constitutional: Negative for fever, chills, weight loss and malaise/fatigue.  Cardiovascular: Negative for chest pain.  Gastrointestinal: Positive for abdominal pain.  Negative for heartburn, diarrhea, constipation and blood in stool.  Genitourinary: Negative for dysuria, urgency and frequency.       See HPI.  Neurological: Negative.   Endo/Heme/Allergies: Negative.     Physical Exam  Constitutional: She is oriented to person, place, and time and well-developed, well-nourished, and in no distress. No distress.  HENT:  Head: Normocephalic and atraumatic.  Eyes: Pupils are equal, round, and reactive to light.  Cardiovascular: Normal rate.   Pulmonary/Chest: Effort normal.  Abdominal: Soft. She exhibits no distension. A hernia is present.    There is a 3 cm fascial defect around midliine at intersection of two prior RUQ incisions/scars.  Neurological: She is oriented to person, place, and time.   Skin: Skin is warm and dry. She is not diaphoretic.  Psychiatric: Mood, memory, affect and judgment normal.    DATA REVIEWED:   I have personally reviewed the patient's imaging, laboratory findings and medical records.    Assessment    64 year old female with complex past surgical history now with an incisional ventral hernia. I believe she is best served to tertiary care [facility].    Plan    I will make her a referral to [facility] surgery for evaluation and further treatment. I'll see her back in the office as needed.         Sherri Rad MD, FACS 11/09/2014, 2:55 PM

## 2014-11-09 NOTE — Patient Instructions (Signed)
We will send your referral to [facility] and they will be contacting you with an appointment. If you don't receive a call from them within a week, please give [place] a call.

## 2014-11-14 ENCOUNTER — Telehealth: Payer: Self-pay | Admitting: Surgery

## 2014-11-14 NOTE — Telephone Encounter (Signed)
-----   Message from [place], [place] sent at 11/09/2014  3:34 PM EDT ----- Regarding: [place]: [phone] Can you please send a referral to [facility] for patient to be evaluated and treated for a Ventral Hernia.  Please look into Dr. Algernon Huxley notes in case you need additional information.   Thank You!

## 2014-11-14 NOTE — Telephone Encounter (Signed)
I have spoken to the patient and informed her of the referral to [facility] for ventral hernia repair.   Appointment:  November 3rd at 9:40 am.  Dr. Dagoberto [facility] [address] [place] [address]

## 2014-12-14 ENCOUNTER — Encounter (HOSPITAL_BASED_OUTPATIENT_CLINIC_OR_DEPARTMENT_OTHER): Payer: Self-pay | Admitting: *Deleted

## 2014-12-19 NOTE — H&P (Signed)
PREOPERATIVE H&P  Chief Complaint: Unspecified fracture of the lower end of unspecified radius, initial encounter for closed fracture S52.509A  HPI: Elizabeth [name] is a 64 y.o. female who presents for preoperative history and physical with a diagnosis of Unspecified fracture of the lower end of unspecified radius, initial encounter for closed fracture S52.509A. Symptoms are rated as moderate to severe, and have been worsening.  This is significantly impairing activities of daily living.  She has elected for surgical management.   Past Medical History  Diagnosis Date  . Memory loss   . Dyspnea   . Hypothyroidism   . Edema   . Hyperglycemia   . Osteopenia   . Hyperlipidemia   . Hypokalemia   . Anemia   . History of kidney stones 2011  . Anxiety     panic attacks   Past Surgical History  Procedure Laterality Date  . Cesarean section    . Stomach surgery    . Abdominal hysterectomy    . Cholecystectomy open  11/17/2013    with repair of bile duct-Dr. Marina Gravel   Social History   Social History  . Marital Status: Married    Spouse Name: N/A  . Number of Children: N/A  . Years of Education: N/A   Social History Main Topics  . Smoking status: Former Smoker -- 0.00 packs/day for 0 years    Quit date: 11/03/1973  . Smokeless tobacco: Never Used     Comment: Smoked briefly as a teen- lives in secondhand smoke.   Marland Kitchen Alcohol Use: No  . Drug Use: No  . Sexual Activity: Not Asked   Other Topics Concern  . None   Social History Narrative   Family History  Problem Relation Age of Onset  . Asthma Daughter   . Heart disease Father   . Cancer Paternal Grandmother     breast  . Cancer Paternal Aunt     breast   Allergies  Allergen Reactions  . Oxycodone-Acetaminophen Hives  . Penicillins Hives    Hives Hives    Prior to Admission medications   Medication Sig Start Date End Date Taking? Authorizing Provider  calcium carbonate (OS-CAL - DOSED IN MG OF ELEMENTAL  CALCIUM) 1250 (500 CA) MG tablet Take by mouth.   Yes Historical Provider, MD  citalopram (CELEXA) 20 MG tablet Take by mouth.   Yes Historical Provider, MD  clonazePAM (KLONOPIN) 1 MG tablet Take 1 mg by mouth 2 (two) times daily.   Yes Historical Provider, MD  levothyroxine (SYNTHROID, LEVOTHROID) 88 MCG tablet Take by mouth.   Yes Historical Provider, MD  omeprazole (PRILOSEC) 40 MG capsule Take by mouth. 03/31/14  Yes Historical Provider, MD  polyethylene glycol (MIRALAX / GLYCOLAX) packet Take by mouth.   Yes Historical Provider, MD  Vitamin D, Ergocalciferol, (DRISDOL) 50000 UNITS CAPS capsule Take by mouth.   Yes Historical Provider, MD  albuterol (PROVENTIL HFA;VENTOLIN HFA) 108 (90 BASE) MCG/ACT inhaler Inhale 2 puffs into the lungs every 6 (six) hours as needed for wheezing or shortness of breath. 11/03/13   Juanito Doom, MD     Positive ROS: All other systems have been reviewed and were otherwise negative with the exception of those mentioned in the HPI and as above.  Physical Exam: General: Alert, no acute distress Cardiovascular: No pedal edema Respiratory: No cyanosis, no use of accessory musculature GI: No organomegaly, abdomen is soft and non-tender Skin: No lesions in the area of chief complaint Neurologic: Sensation intact distally  Psychiatric: Patient is competent for consent with normal mood and affect Lymphatic: No axillary or cervical lymphadenopathy  MUSCULOSKELETAL:  R wrist has moderate swelling and ecchymosis.  Skin intact.  No signs of infection.  Sensation intact with 2+ distal pulses.   Assessment: Unspecified fracture of the lower end of unspecified radius, initial encounter for closed fracture S52.509A  Plan: Plan for Procedure(s): OPEN REDUCTION INTERNAL FIXATION (ORIF) RIGHT DISTAL RADIAL FRACTURE  The risks benefits and alternatives were discussed with the patient including but not limited to the risks of nonoperative treatment, versus surgical  intervention including infection, bleeding, nerve injury,  blood clots, cardiopulmonary complications, morbidity, mortality, among others, and they were willing to proceed.   Gae Dry, PA-C  12/19/2014 10:56 AM

## 2014-12-20 ENCOUNTER — Ambulatory Visit (HOSPITAL_BASED_OUTPATIENT_CLINIC_OR_DEPARTMENT_OTHER): Payer: Medicare Other | Admitting: Anesthesiology

## 2014-12-20 ENCOUNTER — Ambulatory Visit (HOSPITAL_BASED_OUTPATIENT_CLINIC_OR_DEPARTMENT_OTHER)
Admission: RE | Admit: 2014-12-20 | Discharge: 2014-12-20 | Disposition: A | Discharge status: Home / Self Care | Payer: Medicare Other | Source: Ambulatory Visit | Attending: Orthopedic Surgery | Admitting: Orthopedic Surgery

## 2014-12-20 ENCOUNTER — Encounter (HOSPITAL_BASED_OUTPATIENT_CLINIC_OR_DEPARTMENT_OTHER): Payer: Self-pay | Admitting: *Deleted

## 2014-12-20 ENCOUNTER — Encounter (HOSPITAL_BASED_OUTPATIENT_CLINIC_OR_DEPARTMENT_OTHER)
Admission: RE | Disposition: A | Discharge status: Home / Self Care | Payer: Self-pay | Source: Ambulatory Visit | Attending: Orthopedic Surgery

## 2014-12-20 DIAGNOSIS — Z9049 Acquired absence of other specified parts of digestive tract: Secondary | ICD-10-CM | POA: Diagnosis not present

## 2014-12-20 DIAGNOSIS — Z87891 Personal history of nicotine dependence: Secondary | ICD-10-CM | POA: Insufficient documentation

## 2014-12-20 DIAGNOSIS — S52501A Unspecified fracture of the lower end of right radius, initial encounter for closed fracture: Secondary | ICD-10-CM | POA: Diagnosis present

## 2014-12-20 DIAGNOSIS — Z79899 Other long term (current) drug therapy: Secondary | ICD-10-CM | POA: Diagnosis not present

## 2014-12-20 DIAGNOSIS — E785 Hyperlipidemia, unspecified: Secondary | ICD-10-CM | POA: Insufficient documentation

## 2014-12-20 DIAGNOSIS — Z885 Allergy status to narcotic agent status: Secondary | ICD-10-CM | POA: Insufficient documentation

## 2014-12-20 DIAGNOSIS — M8580 Other specified disorders of bone density and structure, unspecified site: Secondary | ICD-10-CM | POA: Diagnosis not present

## 2014-12-20 DIAGNOSIS — E039 Hypothyroidism, unspecified: Secondary | ICD-10-CM | POA: Insufficient documentation

## 2014-12-20 DIAGNOSIS — F410 Panic disorder [episodic paroxysmal anxiety] without agoraphobia: Secondary | ICD-10-CM | POA: Insufficient documentation

## 2014-12-20 DIAGNOSIS — Z880 Allergy status to penicillin: Secondary | ICD-10-CM | POA: Insufficient documentation

## 2014-12-20 DIAGNOSIS — Z90710 Acquired absence of both cervix and uterus: Secondary | ICD-10-CM | POA: Insufficient documentation

## 2014-12-20 DIAGNOSIS — I10 Essential (primary) hypertension: Secondary | ICD-10-CM | POA: Diagnosis not present

## 2014-12-20 HISTORY — DX: Anxiety disorder, unspecified: F41.9

## 2014-12-20 HISTORY — PX: OPEN REDUCTION INTERNAL FIXATION (ORIF) DISTAL RADIAL FRACTURE: SHX5989

## 2014-12-20 SURGERY — OPEN REDUCTION INTERNAL FIXATION (ORIF) DISTAL RADIUS FRACTURE
Anesthesia: Regional | Site: Arm Lower | Laterality: Right

## 2014-12-20 MED ORDER — LIDOCAINE HCL (CARDIAC) 20 MG/ML IV SOLN
INTRAVENOUS | Status: DC | PRN
  Administered 2014-12-20: 50 mg via INTRAVENOUS

## 2014-12-20 MED ORDER — SODIUM CHLORIDE 0.9 % IJ SOLN
INTRAMUSCULAR | Status: AC
  Filled 2014-12-20: qty 10

## 2014-12-20 MED ORDER — OXYCODONE HCL 5 MG/5ML PO SOLN: 5.0000 mg | Freq: Once | ORAL | Status: DC | PRN

## 2014-12-20 MED ORDER — ONDANSETRON HCL 4 MG PO TABS: 4.0000 mg | ORAL_TABLET | Freq: Three times a day (TID) | ORAL | Status: DC | PRN

## 2014-12-20 MED ORDER — PHENYLEPHRINE HCL 10 MG/ML IJ SOLN
INTRAMUSCULAR | Status: DC | PRN
  Administered 2014-12-20 (×3): 40 ug via INTRAVENOUS

## 2014-12-20 MED ORDER — BUPIVACAINE HCL (PF) 0.5 % IJ SOLN
INTRAMUSCULAR | Status: AC
  Filled 2014-12-20: qty 30

## 2014-12-20 MED ORDER — DOCUSATE SODIUM 100 MG PO CAPS: 100.0000 mg | ORAL_CAPSULE | Freq: Two times a day (BID) | ORAL | Status: DC

## 2014-12-20 MED ORDER — HYDROMORPHONE HCL 1 MG/ML IJ SOLN: 0.2500 mg | INTRAMUSCULAR | Status: DC | PRN

## 2014-12-20 MED ORDER — FENTANYL CITRATE (PF) 100 MCG/2ML IJ SOLN
50.0000 ug | INTRAMUSCULAR | Status: DC | PRN
  Administered 2014-12-20: 100 ug via INTRAVENOUS

## 2014-12-20 MED ORDER — FENTANYL CITRATE (PF) 100 MCG/2ML IJ SOLN
INTRAMUSCULAR | Status: AC
  Filled 2014-12-20: qty 2

## 2014-12-20 MED ORDER — DEXAMETHASONE SODIUM PHOSPHATE 10 MG/ML IJ SOLN
INTRAMUSCULAR | Status: DC | PRN
  Administered 2014-12-20: 10 mg via INTRAVENOUS

## 2014-12-20 MED ORDER — HYDROCODONE-ACETAMINOPHEN 5-325 MG PO TABS: 1.0000 | ORAL_TABLET | Freq: Four times a day (QID) | ORAL | Status: DC | PRN

## 2014-12-20 MED ORDER — PHENYLEPHRINE HCL 10 MG/ML IJ SOLN
INTRAMUSCULAR | Status: AC
  Filled 2014-12-20: qty 1

## 2014-12-20 MED ORDER — SCOPOLAMINE 1 MG/3DAYS TD PT72: 1.0000 | MEDICATED_PATCH | Freq: Once | TRANSDERMAL | Status: DC | PRN

## 2014-12-20 MED ORDER — CEFAZOLIN SODIUM 1 G IJ SOLR
INTRAMUSCULAR | Status: AC
  Filled 2014-12-20: qty 10

## 2014-12-20 MED ORDER — MIDAZOLAM HCL 2 MG/2ML IJ SOLN
INTRAMUSCULAR | Status: AC
  Filled 2014-12-20: qty 2

## 2014-12-20 MED ORDER — ACETAMINOPHEN 500 MG PO TABS
1000.0000 mg | ORAL_TABLET | Freq: Once | ORAL | Status: AC
  Administered 2014-12-20: 1000 mg via ORAL

## 2014-12-20 MED ORDER — POTASSIUM CHLORIDE IN NACL 20-0.45 MEQ/L-% IV SOLN: INTRAVENOUS | Status: DC

## 2014-12-20 MED ORDER — OXYCODONE HCL 5 MG PO TABS: 5.0000 mg | ORAL_TABLET | Freq: Once | ORAL | Status: DC | PRN

## 2014-12-20 MED ORDER — GLYCOPYRROLATE 0.2 MG/ML IJ SOLN: 0.2000 mg | Freq: Once | INTRAMUSCULAR | Status: DC | PRN

## 2014-12-20 MED ORDER — BUPIVACAINE-EPINEPHRINE (PF) 0.5% -1:200000 IJ SOLN
INTRAMUSCULAR | Status: DC | PRN
  Administered 2014-12-20: 22 mL via PERINEURAL

## 2014-12-20 MED ORDER — MEPERIDINE HCL 25 MG/ML IJ SOLN: 6.2500 mg | INTRAMUSCULAR | Status: DC | PRN

## 2014-12-20 MED ORDER — CEFAZOLIN SODIUM-DEXTROSE 2-3 GM-% IV SOLR
INTRAVENOUS | Status: DC | PRN
  Administered 2014-12-20: 2 g via INTRAVENOUS

## 2014-12-20 MED ORDER — CHLORHEXIDINE GLUCONATE 4 % EX LIQD: 60.0000 mL | Freq: Once | CUTANEOUS | Status: DC

## 2014-12-20 MED ORDER — CLINDAMYCIN PHOSPHATE 900 MG/50ML IV SOLN
INTRAVENOUS | Status: AC
  Filled 2014-12-20: qty 50

## 2014-12-20 MED ORDER — MIDAZOLAM HCL 2 MG/2ML IJ SOLN
1.0000 mg | INTRAMUSCULAR | Status: DC | PRN
  Administered 2014-12-20: 1 mg via INTRAVENOUS

## 2014-12-20 MED ORDER — PROPOFOL 10 MG/ML IV BOLUS
INTRAVENOUS | Status: DC | PRN
  Administered 2014-12-20: 50 mg via INTRAVENOUS
  Administered 2014-12-20: 200 mg via INTRAVENOUS
  Administered 2014-12-20 (×6): 50 mg via INTRAVENOUS

## 2014-12-20 MED ORDER — LACTATED RINGERS IV SOLN
INTRAVENOUS | Status: DC
  Administered 2014-12-20 (×2): via INTRAVENOUS

## 2014-12-20 MED ORDER — FENTANYL CITRATE (PF) 100 MCG/2ML IJ SOLN
INTRAMUSCULAR | Status: DC | PRN
  Administered 2014-12-20: 50 ug via INTRAVENOUS
  Administered 2014-12-20: 100 ug via INTRAVENOUS
  Administered 2014-12-20 (×2): 50 ug via INTRAVENOUS
  Administered 2014-12-20 (×2): 25 ug via INTRAVENOUS

## 2014-12-20 MED ORDER — ONDANSETRON HCL 4 MG/2ML IJ SOLN
INTRAMUSCULAR | Status: DC | PRN
  Administered 2014-12-20: 4 mg via INTRAVENOUS

## 2014-12-20 MED ORDER — CLINDAMYCIN PHOSPHATE 900 MG/50ML IV SOLN
900.0000 mg | INTRAVENOUS | Status: AC
  Administered 2014-12-20: 900 mg via INTRAVENOUS

## 2014-12-20 MED ORDER — ACETAMINOPHEN 500 MG PO TABS
ORAL_TABLET | ORAL | Status: AC
  Filled 2014-12-20: qty 2

## 2014-12-20 MED ORDER — MIDAZOLAM HCL 2 MG/2ML IJ SOLN
INTRAMUSCULAR | Status: AC
Start: 2014-12-20 — End: 2014-12-20
  Filled 2014-12-20: qty 2

## 2014-12-20 SURGICAL SUPPLY — 78 items
BANDAGE ELASTIC 4 VELCRO ST LF (GAUZE/BANDAGES/DRESSINGS) ×3 IMPLANT
BIT DRILL 2.2 SS TIBIAL (BIT) ×3 IMPLANT
BLADE SURG 15 STRL LF DISP TIS (BLADE) ×2 IMPLANT
BLADE SURG 15 STRL SS (BLADE) ×4
BNDG ESMARK 4X9 LF (GAUZE/BANDAGES/DRESSINGS) ×3 IMPLANT
CHLORAPREP W/TINT 26ML (MISCELLANEOUS) ×3 IMPLANT
CLOSURE STERI-STRIP 1/2X4 (GAUZE/BANDAGES/DRESSINGS) ×1
CLSR STERI-STRIP ANTIMIC 1/2X4 (GAUZE/BANDAGES/DRESSINGS) ×2 IMPLANT
CORDS BIPOLAR (ELECTRODE) ×3 IMPLANT
COVER BACK TABLE 60X90IN (DRAPES) ×3 IMPLANT
CUFF TOURNIQUET SINGLE 18IN (TOURNIQUET CUFF) ×3 IMPLANT
CUFF TOURNIQUET SINGLE 24IN (TOURNIQUET CUFF) IMPLANT
DECANTER SPIKE VIAL GLASS SM (MISCELLANEOUS) IMPLANT
DRAPE EXTREMITY T 121X128X90 (DRAPE) ×3 IMPLANT
DRAPE OEC MINIVIEW 54X84 (DRAPES) ×3 IMPLANT
DRAPE SURG 17X23 STRL (DRAPES) IMPLANT
DRAPE U 20/CS (DRAPES) ×3 IMPLANT
DRSG EMULSION OIL 3X3 NADH (GAUZE/BANDAGES/DRESSINGS) ×3 IMPLANT
GAUZE SPONGE 4X4 12PLY STRL (GAUZE/BANDAGES/DRESSINGS) ×3 IMPLANT
GAUZE XEROFORM 1X8 LF (GAUZE/BANDAGES/DRESSINGS) IMPLANT
GLOVE BIO SURGEON STRL SZ 6.5 (GLOVE) ×2 IMPLANT
GLOVE BIO SURGEON STRL SZ7 (GLOVE) ×3 IMPLANT
GLOVE BIO SURGEON STRL SZ7.5 (GLOVE) ×3 IMPLANT
GLOVE BIO SURGEON STRL SZ8 (GLOVE) ×3 IMPLANT
GLOVE BIO SURGEONS STRL SZ 6.5 (GLOVE) ×1
GLOVE BIOGEL PI IND STRL 7.0 (GLOVE) ×2 IMPLANT
GLOVE BIOGEL PI IND STRL 8 (GLOVE) ×2 IMPLANT
GLOVE BIOGEL PI INDICATOR 7.0 (GLOVE) ×4
GLOVE BIOGEL PI INDICATOR 8 (GLOVE) ×4
GOWN STRL REUS W/ TWL LRG LVL3 (GOWN DISPOSABLE) ×2 IMPLANT
GOWN STRL REUS W/ TWL XL LVL3 (GOWN DISPOSABLE) ×1 IMPLANT
GOWN STRL REUS W/TWL LRG LVL3 (GOWN DISPOSABLE) ×4
GOWN STRL REUS W/TWL XL LVL3 (GOWN DISPOSABLE) ×2
K-WIRE 1.6 (WIRE) ×6
K-WIRE FX5X1.6XNS BN SS (WIRE) ×3
KWIRE FX5X1.6XNS BN SS (WIRE) ×3 IMPLANT
NEEDLE HYPO 25X1 1.5 SAFETY (NEEDLE) ×3 IMPLANT
NS IRRIG 1000ML POUR BTL (IV SOLUTION) ×3 IMPLANT
PACK BASIN DAY SURGERY FS (CUSTOM PROCEDURE TRAY) ×3 IMPLANT
PAD CAST 4YDX4 CTTN HI CHSV (CAST SUPPLIES) ×1 IMPLANT
PADDING CAST ABS 4INX4YD NS (CAST SUPPLIES) ×2
PADDING CAST ABS COTTON 4X4 ST (CAST SUPPLIES) ×1 IMPLANT
PADDING CAST COTTON 4X4 STRL (CAST SUPPLIES) ×2
PEG LOCKING SMOOTH 2.2X14 (Peg) ×3 IMPLANT
PEG LOCKING SMOOTH 2.2X16 (Screw) ×6 IMPLANT
PEG LOCKING SMOOTH 2.2X18 (Screw) ×12 IMPLANT
PEG LOCKING SMOOTH 2.2X20 (Screw) ×3 IMPLANT
PLATE DVR CROSSLOCK STD RT (Plate) ×3 IMPLANT
SCREW  LP NL 2.7X10MM (Screw) ×2 IMPLANT
SCREW  LP NL 2.7X13MM (Screw) ×2 IMPLANT
SCREW 2.7X14MM (Screw) ×2 IMPLANT
SCREW BN 14X2.7XNONLOCK 3 LD (Screw) ×1 IMPLANT
SCREW LOCKING 2.7X13MM (Screw) ×3 IMPLANT
SCREW LP NL 2.7X10MM (Screw) ×1 IMPLANT
SCREW LP NL 2.7X13MM (Screw) ×1 IMPLANT
SLEEVE SCD COMPRESS KNEE MED (MISCELLANEOUS) ×3 IMPLANT
SPLINT FAST PLASTER 5X30 (CAST SUPPLIES)
SPLINT PLASTER CAST FAST 5X30 (CAST SUPPLIES) IMPLANT
SPLINT PLASTER CAST XFAST 3X15 (CAST SUPPLIES) ×10 IMPLANT
SPLINT PLASTER CAST XFAST 4X15 (CAST SUPPLIES) IMPLANT
SPLINT PLASTER XTRA FAST SET 4 (CAST SUPPLIES)
SPLINT PLASTER XTRA FASTSET 3X (CAST SUPPLIES) ×20
SUCTION FRAZIER TIP 10 FR DISP (SUCTIONS) IMPLANT
SUT ETHILON 3 0 PS 1 (SUTURE) ×3 IMPLANT
SUT MON AB 2-0 CT1 36 (SUTURE) ×3 IMPLANT
SUT MON AB 4-0 PC3 18 (SUTURE) IMPLANT
SUT PROLENE 3 0 PS 2 (SUTURE) IMPLANT
SUT VIC AB 0 SH 27 (SUTURE) ×3 IMPLANT
SUT VIC AB 2-0 SH 27 (SUTURE)
SUT VIC AB 2-0 SH 27XBRD (SUTURE) IMPLANT
SUT VIC AB 3-0 FS2 27 (SUTURE) IMPLANT
SYR BULB 3OZ (MISCELLANEOUS) ×3 IMPLANT
SYR CONTROL 10ML LL (SYRINGE) ×3 IMPLANT
TOWEL OR 17X24 6PK STRL BLUE (TOWEL DISPOSABLE) ×3 IMPLANT
TOWEL OR NON WOVEN STRL DISP B (DISPOSABLE) ×3 IMPLANT
TUBE CONNECTING 20'X1/4 (TUBING)
TUBE CONNECTING 20X1/4 (TUBING) IMPLANT
UNDERPAD 30X30 (UNDERPADS AND DIAPERS) ×3 IMPLANT

## 2014-12-20 NOTE — Anesthesia Preprocedure Evaluation (Signed)
Anesthesia Evaluation  Patient identified by MRN, date of birth, ID band Patient awake    Reviewed: Allergy & Precautions, NPO status , Patient's Chart, lab work & pertinent test results  Airway Mallampati: II  TM Distance: >3 FB Neck ROM: Full    Dental  (+) Teeth Intact, Dental Advisory Given   Pulmonary former smoker,    breath sounds clear to auscultation       Cardiovascular hypertension, Pt. on medications  Rhythm:Regular Rate:Normal     Neuro/Psych    GI/Hepatic   Endo/Other    Renal/GU      Musculoskeletal   Abdominal   Peds  Hematology   Anesthesia Other Findings   Reproductive/Obstetrics                             Anesthesia Physical Anesthesia Plan  ASA: II  Anesthesia Plan: General and Regional   Post-op Pain Management:    Induction: Intravenous  Airway Management Planned: LMA  Additional Equipment:   Intra-op Plan:   Post-operative Plan: Extubation in OR  Informed Consent: I have reviewed the patients History and Physical, chart, labs and discussed the procedure including the risks, benefits and alternatives for the proposed anesthesia with the patient or authorized representative who has indicated his/her understanding and acceptance.   Dental advisory given  Plan Discussed with: CRNA, Anesthesiologist and Surgeon  Anesthesia Plan Comments:         Anesthesia Quick Evaluation

## 2014-12-20 NOTE — Op Note (Signed)
12/20/2014  1:43 PM  PATIENT:  Elizabeth [name]    PRE-OPERATIVE DIAGNOSIS:  Unspecified fracture of the lower end of unspecified radius, initial encounter for closed fracture S52.509A  POST-OPERATIVE DIAGNOSIS:  Same  PROCEDURE:  OPEN REDUCTION INTERNAL FIXATION (ORIF) RIGHT DISTAL RADIAL FRACTURE  SURGEON:  Marvyn Torrez, Ernesta Amble, MD  ASSISTANT: Lovett Calender, PA-C, She was present and scrubbed throughout the case, critical for completion in a timely fashion, and for retraction, instrumentation, and closure.   ANESTHESIA:   gen  PREOPERATIVE INDICATIONS:  [name] is a  65 y.o. female with a diagnosis of Unspecified fracture of the lower end of unspecified radius, initial encounter for closed fracture S52.509A who failed conservative measures and elected for surgical management.    The risks benefits and alternatives were discussed with the patient preoperatively including but not limited to the risks of infection, bleeding, nerve injury, cardiopulmonary complications, the need for revision surgery, among others, and the patient was willing to proceed.  OPERATIVE IMPLANTS: DVR plate  BLOOD LOSS: min  COMPLICATIONS: none  TOURNIQUET TIME: 74min  OPERATIVE PROCEDURE:  Patient was identified in the preoperative holding area and site was marked by me She was transported to the operating theater and placed on the table in supine position taking care to pad all bony prominences. After a preincinduction time out anesthesia was induced. The right upper extremity was prepped and draped in normal sterile fashion and a pre-incision timeout was performed. She received clinda and ancef for preoperative antibiotics.   I made a 5 cm incision centered over her FCR tendon and dissected down carefully to the level of the flexor tendon sheath and incise this longitudinally and retracted the FCR radially and incised the dorsal aspect of the sheath.   I bluntly dissected the FPL muscle  belly away from the brachioradialis and then sharply incised the pronator tendon from the distal radius and from the wrist capsule. I Elevated this off the bone the fractures visible.   I released the brachioradialis from its insertion. I then debrided the fracture and performed a manual reduction.   I selected a plate and I placed it on the bone. I pinned it into place and was happy on multiple radiographic views with it's placement. I then fixed the plate distally with the locking pegs. I confirmed no articular penetration with the pegs and that none were prominent dorsally.   I then reduced the plate to the shaft improving the volar and radial tilt of her distal radius.  I was happy with the final fluoro xrays    I thoroughly irrigated the wound and closed the pronator over top of the plate and then closed the skin in layers with absorbable stitch. Sterile dressing was applied using the PACU in stable condition.  POST OPERATIVE PLAN: NWB, Splint full time. Ambulate for DVT px.    This note was generated using a template and dragon dictation system. In light of that, I have reviewed the note and all aspects of it are applicable to this case. Any dictation errors are due to the computerized dictation system.

## 2014-12-20 NOTE — Anesthesia Procedure Notes (Addendum)
Anesthesia Regional Block:  Infraclavicular brachial plexus block  Pre-Anesthetic Checklist: ,, timeout performed, Correct Patient, Correct Site, Correct Laterality, Correct Procedure, Correct Position, site marked, Risks and benefits discussed,  Surgical consent,  Pre-op evaluation,  At surgeon's request and post-op pain management  Laterality: Right and Upper  Prep: chloraprep       Needles:  Injection technique: Single-shot  Needle Type: Echogenic Stimulator Needle     Needle Length: 5cm 5 cm Needle Gauge: 21 and 21 G    Additional Needles:  Procedures: ultrasound guided (picture in chart) Infraclavicular brachial plexus block Narrative:  Start time: 12/20/2014 12:19 PM End time: 12/20/2014 12:23 PM Injection made incrementally with aspirations every 5 mL.  Performed by: Personally  Anesthesiologist: CREWS, DAVID   Procedure Name: LMA Insertion Date/Time: 12/20/2014 12:39 PM Performed by: Marrianne Mood Pre-anesthesia Checklist: Patient identified, Emergency Drugs available, Suction available, Patient being monitored and Timeout performed Patient Re-evaluated:Patient Re-evaluated prior to inductionOxygen Delivery Method: Circle System Utilized Preoxygenation: Pre-oxygenation with 100% oxygen Intubation Type: IV induction Ventilation: Mask ventilation without difficulty LMA: LMA inserted LMA Size: 4.0 Number of attempts: 1 Airway Equipment and Method: Bite block Placement Confirmation: positive ETCO2 Tube secured with: Tape Dental Injury: Teeth and Oropharynx as per pre-operative assessment

## 2014-12-20 NOTE — Transfer of Care (Signed)
Immediate Anesthesia Transfer of Care Note  Patient: Elizabeth [name]  Procedure(s) Performed: Procedure(s): OPEN REDUCTION INTERNAL FIXATION (ORIF) RIGHT DISTAL RADIAL FRACTURE (Right)  Patient Location: PACU  Anesthesia Type:GA combined with regional for post-op pain  Level of Consciousness: awake and patient cooperative  Airway & Oxygen Therapy: Patient Spontanous Breathing and Patient connected to face mask oxygen  Post-op Assessment: Report given to RN and Post -op Vital signs reviewed and stable  Post vital signs: Reviewed and stable  Last Vitals:  Filed Vitals:   12/20/14 1220 12/20/14 1407  BP: 131/62   Pulse: 85 86  Temp:    Resp: 26 14    Complications: No apparent anesthesia complications

## 2014-12-20 NOTE — Discharge Instructions (Signed)
Keep splint clean and dry till follow up  Non-weight bearing in the R arm, sling for comfort  Regional Anesthesia Blocks  1. Numbness or the inability to move the "blocked" extremity may last from 3-48 hours after placement. The length of time depends on the medication injected and your individual response to the medication. If the numbness is not going away after 48 hours, call your surgeon.  2. The extremity that is blocked will need to be protected until the numbness is gone and the  Strength has returned. Because you cannot feel it, you will need to take extra care to avoid injury. Because it may be weak, you may have difficulty moving it or using it. You may not know what position it is in without looking at it while the block is in effect.  3. For blocks in the legs and feet, returning to weight bearing and walking needs to be done carefully. You will need to wait until the numbness is entirely gone and the strength has returned. You should be able to move your leg and foot normally before you try and bear weight or walk. You will need someone to be with you when you first try to ensure you do not fall and possibly risk injury.  4. Bruising and tenderness at the needle site are common side effects and will resolve in a few days.  5. Persistent numbness or new problems with movement should be communicated to the surgeon or the [place] [phone] [place] [phone]).  Post Anesthesia Home Care Instructions  Activity: Get plenty of rest for the remainder of the day. A responsible adult should stay with you for 24 hours following the procedure.  For the next 24 hours, DO NOT: -Drive a car -Paediatric nurse -Drink alcoholic beverages -Take any medication unless instructed by your physician -Make any legal decisions or sign important papers.  Meals: Start with liquid foods such as gelatin or soup. Progress to regular foods as tolerated. Avoid  greasy, spicy, heavy foods. If nausea and/or vomiting occur, drink only clear liquids until the nausea and/or vomiting subsides. Call your physician if vomiting continues.  Special Instructions/Symptoms: Your throat may feel dry or sore from the anesthesia or the breathing tube placed in your throat during surgery. If this causes discomfort, gargle with warm salt water. The discomfort should disappear within 24 hours.  If you had a scopolamine patch placed behind your ear for the management of post- operative nausea and/or vomiting:  1. The medication in the patch is effective for 72 hours, after which it should be removed.  Wrap patch in a tissue and discard in the trash. Wash hands thoroughly with soap and water. 2. You may remove the patch earlier than 72 hours if you experience unpleasant side effects which may include dry mouth, dizziness or visual disturbances. 3. Avoid touching the patch. Wash your hands with soap and water after contact with the patch.

## 2014-12-20 NOTE — Progress Notes (Signed)
Assisted Dr. Crews with right, ultrasound guided, supraclavicular block. Side rails up, monitors on throughout procedure. See vital signs in flow sheet. Tolerated Procedure well. 

## 2014-12-20 NOTE — Interval H&P Note (Signed)
History and Physical Interval Note:  12/20/2014 7:28 AM  Elizabeth [name]  has presented today for surgery, with the diagnosis of Unspecified fracture of the lower end of unspecified radius, initial encounter for closed fracture S52.509A  The various methods of treatment have been discussed with the patient and family. After consideration of risks, benefits and other options for treatment, the patient has consented to  Procedure(s): OPEN REDUCTION INTERNAL FIXATION (ORIF) RIGHT DISTAL RADIAL FRACTURE (Right) as a surgical intervention .  The patient's history has been reviewed, patient examined, no change in status, stable for surgery.  I have reviewed the patient's chart and labs.  Questions were answered to the patient's satisfaction.     Cheria Sadiq D

## 2014-12-20 NOTE — Anesthesia Postprocedure Evaluation (Signed)
Anesthesia Post Note  Patient: Elizabeth [name]  Procedure(s) Performed: Procedure(s) (LRB): OPEN REDUCTION INTERNAL FIXATION (ORIF) RIGHT DISTAL RADIAL FRACTURE (Right)  Patient location during evaluation: PACU Anesthesia Type: General and Regional Level of consciousness: awake and alert Pain management: pain level controlled Vital Signs Assessment: post-procedure vital signs reviewed and stable Respiratory status: spontaneous breathing, nonlabored ventilation and respiratory function stable Cardiovascular status: blood pressure returned to baseline and stable Postop Assessment: no signs of nausea or vomiting Anesthetic complications: no    Last Vitals:  Filed Vitals:   12/20/14 1430 12/20/14 1438  BP: 129/74   Pulse: 73 73  Temp:    Resp: 21 16    Last Pain:  Filed Vitals:   12/20/14 1440  PainSc: 0-No pain                 Skylen Spiering A

## 2014-12-21 ENCOUNTER — Encounter (HOSPITAL_BASED_OUTPATIENT_CLINIC_OR_DEPARTMENT_OTHER): Payer: Self-pay | Admitting: Orthopedic Surgery

## 2015-01-11 ENCOUNTER — Encounter: Payer: Self-pay | Admitting: Internal Medicine

## 2015-01-22 DIAGNOSIS — K56609 Unspecified intestinal obstruction, unspecified as to partial versus complete obstruction: Secondary | ICD-10-CM

## 2015-01-22 DIAGNOSIS — K922 Gastrointestinal hemorrhage, unspecified: Secondary | ICD-10-CM

## 2015-01-22 HISTORY — DX: Gastrointestinal hemorrhage, unspecified: K92.2

## 2015-01-22 HISTORY — DX: Unspecified intestinal obstruction, unspecified as to partial versus complete obstruction: K56.609

## 2015-02-20 ENCOUNTER — Ambulatory Visit: Payer: Medicare Other | Admitting: Urology

## 2015-04-13 ENCOUNTER — Encounter: Payer: Self-pay | Admitting: Urology

## 2015-04-13 ENCOUNTER — Ambulatory Visit (INDEPENDENT_AMBULATORY_CARE_PROVIDER_SITE_OTHER): Payer: Medicare Other | Admitting: Urology

## 2015-04-13 VITALS — BP 120/76 | HR 85 | Ht 62.0 in | Wt 155.2 lb

## 2015-04-13 DIAGNOSIS — R3129 Other microscopic hematuria: Secondary | ICD-10-CM

## 2015-04-13 LAB — MICROSCOPIC EXAMINATION
BACTERIA UA: NONE SEEN
Epithelial Cells (non renal): NONE SEEN /hpf (ref 0–10)
WBC UA: NONE SEEN /HPF (ref 0–?)

## 2015-04-13 LAB — URINALYSIS, COMPLETE
Bilirubin, UA: NEGATIVE
Glucose, UA: NEGATIVE
Ketones, UA: NEGATIVE
LEUKOCYTES UA: NEGATIVE
Nitrite, UA: NEGATIVE
PH UA: 5.5 (ref 5.0–7.5)
PROTEIN UA: NEGATIVE
Specific Gravity, UA: 1.015 (ref 1.005–1.030)
Urobilinogen, Ur: 0.2 mg/dL (ref 0.2–1.0)

## 2015-04-13 NOTE — Progress Notes (Signed)
3:21 PM   Elizabeth [name] 04-19-1950 [id number]  Referring provider: Casilda Carls, MD [address]   [place], [place] [address]  Chief Complaint  Patient presents with  . Hematuria    6 month follow up    HPI: Patient is a 65 year old Caucasian female who presents today for 6 month follow-up for hematuria.  Background history 65 year old female sent by Dr. Casilda Carls for evaluation and management of hematuria. She was recently found to have dipstick positive hematuria on routine urinalysis. The patient had been having some left side pain, mainly with moving around. She has no pain when still. It is not colicky in nature. She denies any gross hematuria. She did have a kidney stone about 5 years ago by report she had a CT scan in October 2015 prior to a cholecystectomy which revealed no renal abnormalities.  Cystoscopy was performed on 08/18/2014 and was normal.  More than likely, she has glomerular leakage of the blood, and at this point I would say that this is a benign issue  Right adrenal adenoma on CT scan-1.5 cm when measured in October 2015. I do not think this needs follow-up, noted to be benign in appearance  Today, she does not endorse episodes of gross hematuria since her last visit with [place].   She denies any dysuria or suprapubic pain.   She does experience nocturia, but is not bothersome to her at this time.   Her UA today didn't demonstrate 3-10 rbc's per high-power field.  She has not had any recent fevers, chills, nausea or vomiting.  PMH: Past Medical History  Diagnosis Date  . Memory loss   . Dyspnea   . Hypothyroidism   . Edema   . Hyperglycemia   . Osteopenia   . Hyperlipidemia   . Hypokalemia   . Anemia   . History of kidney stones 2011  . Anxiety     panic attacks  . Ventral hernia   . Liver cyst   . Anxiety and depression     Surgical History: Past Surgical History  Procedure Laterality Date  . Cesarean section    . Stomach surgery    .  Abdominal hysterectomy    . Cholecystectomy open  11/17/2013    with repair of bile duct-Dr. Marina Gravel  . Open reduction internal fixation (orif) distal radial fracture Right 12/20/2014    Procedure: OPEN REDUCTION INTERNAL FIXATION (ORIF) RIGHT DISTAL RADIAL FRACTURE;  Surgeon: Renette Butters, MD;  Location: [facility];  Service: Orthopedics;  Laterality: Right;    Home Medications:    Medication List       This list is accurate as of: 04/13/15  3:21 PM.  Always use your most recent med list.               albuterol 108 (90 Base) MCG/ACT inhaler  Commonly known as:  PROVENTIL HFA;VENTOLIN HFA  Inhale 2 puffs into the lungs every 6 (six) hours as needed for wheezing or shortness of breath.     calcium carbonate 1250 (500 Ca) MG tablet  Commonly known as:  OS-CAL - dosed in mg of elemental calcium  Take by mouth.     citalopram 20 MG tablet  Commonly known as:  CELEXA  Take by mouth.     clonazePAM 1 MG tablet  Commonly known as:  KLONOPIN  Take 1 mg by mouth 2 (two) times daily.     docusate sodium 100 MG capsule  Commonly known  as:  COLACE  Take 1 capsule (100 mg total) by mouth 2 (two) times daily.     Ferrous Fumarate 324 (106 Fe) MG Tabs tablet  Commonly known as:  HEMOCYTE - 106 mg FE  Take by mouth. Reported on 04/13/2015     HYDROcodone-acetaminophen 5-325 MG tablet  Commonly known as:  NORCO  Take 1-2 tablets by mouth every 6 (six) hours as needed for moderate pain.     levothyroxine 88 MCG tablet  Commonly known as:  SYNTHROID, LEVOTHROID  Take by mouth.     omeprazole 40 MG capsule  Commonly known as:  PRILOSEC  Take by mouth.     ondansetron 4 MG tablet  Commonly known as:  ZOFRAN  Take 1 tablet (4 mg total) by mouth every 8 (eight) hours as needed for nausea or vomiting.     polyethylene glycol packet  Commonly known as:  MIRALAX / GLYCOLAX  Take by mouth.     PROBIOTIC & ACIDOPHILUS EX ST PO  Take by mouth.     Vitamin D  (Ergocalciferol) 50000 units Caps capsule  Commonly known as:  DRISDOL  Take by mouth.        Allergies:  Allergies  Allergen Reactions  . Oxycodone-Acetaminophen Hives    "has been taking without any problem"  . Penicillins Hives    Hives Hives     Family History: Family History  Problem Relation Age of Onset  . Asthma Daughter   . Heart disease Father   . Cancer Paternal Grandmother     breast  . Cancer Paternal Aunt     breast  . Kidney disease Paternal Aunt   . Bladder Cancer Neg Hx     Social History:  reports that she quit smoking about 41 years ago. She has never used smokeless tobacco. She reports that she does not drink alcohol or use illicit drugs.  ROS: UROLOGY Frequent Urination?: No Hard to postpone urination?: No Burning/pain with urination?: Yes Get up at night to urinate?: Yes Leakage of urine?: No Urine stream starts and stops?: No Trouble starting stream?: No Do you have to strain to urinate?: No Blood in urine?: Yes Urinary tract infection?: Yes Sexually transmitted disease?: No Injury to kidneys or bladder?: No Painful intercourse?: No Weak stream?: No Currently pregnant?: No Vaginal bleeding?: No Last menstrual period?: n  Gastrointestinal Nausea?: No Vomiting?: No Indigestion/heartburn?: No Diarrhea?: Yes Constipation?: Yes  Constitutional Fever: No Night sweats?: No Weight loss?: No Fatigue?: Yes  Skin Skin rash/lesions?: No Itching?: No  Eyes Blurred vision?: No Double vision?: No  Ears/Nose/Throat Sore throat?: No Sinus problems?: No  Hematologic/Lymphatic Swollen glands?: No Easy bruising?: No  Cardiovascular Leg swelling?: Yes Chest pain?: No  Respiratory Cough?: No Shortness of breath?: No  Endocrine Excessive thirst?: No  Musculoskeletal Back pain?: No Joint pain?: No  Neurological Headaches?: No Dizziness?: No  Psychologic Depression?: Yes Anxiety?: Yes  Physical Exam: BP 120/76  mmHg  Pulse 85  Ht 5\' 2"  (1.575 m)  Wt 155 lb 3.2 oz (70.398 kg)  BMI 28.38 kg/m2  Constitutional:  Alert and oriented, No acute distress. HEENT:  AT, moist mucus membranes.  Trachea midline, no masses. Cardiovascular: No clubbing, cyanosis, or edema. Respiratory: Normal respiratory effort, no increased work of breathing. GI: Abdomen is soft, nontender, nondistended, no abdominal masses GU: mild left flank tenderness-no significant CVA tenderness. There is paraspinous muscle tenderness noted. Skin: No rashes, bruises or suspicious lesions. Lymph: No cervical or inguinal adenopathy. Neurologic:  Grossly intact, no focal deficits, moving all 4 extremities. Psychiatric: Normal mood and affect.  Laboratory Data: Lab Results  Component Value Date   WBC 9.6 11/20/2013   HGB 9.8* 11/20/2013   HCT 29.4* 11/20/2013   MCV 93 11/20/2013   PLT 299 11/20/2013    Lab Results  Component Value Date   CREATININE 0.91 11/20/2013     Urinalysis Results for orders placed or performed in visit on 04/13/15  Microscopic Examination  Result Value Ref Range   WBC, UA None seen 0 -  5 /hpf   RBC, UA 3-10 (A) 0 -  2 /hpf   Epithelial Cells (non renal) None seen 0 - 10 /hpf   Bacteria, UA None seen None seen/Few  Urinalysis, Complete  Result Value Ref Range   Specific Gravity, UA 1.015 1.005 - 1.030   pH, UA 5.5 5.0 - 7.5   Color, UA Yellow Yellow   Appearance Ur Clear Clear   Leukocytes, UA Negative Negative   Protein, UA Negative Negative/Trace   Glucose, UA Negative Negative   Ketones, UA Negative Negative   RBC, UA 2+ (A) Negative   Bilirubin, UA Negative Negative   Urobilinogen, Ur 0.2 0.2 - 1.0 mg/dL   Nitrite, UA Negative Negative   Microscopic Examination See below:     Assessment   1.Microscopic hematuria-patient had a normal CT abdomen and pelvis with regards to her upper tracts in October, 2016. At this point, I do not think she needs repeat scanning. Cystoscopy was performed  on 08/18/2014 this was normal. 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 I do not think this needs follow-up, noted to be benign in appearance  Plan:  1. Patient was offered her referral to nephrology for further evaluation of the persistent microscopic hematuria.  She declines at this time. She return in 6 months for a repeat  UA and office visit.    Return in about 6 months (around 10/14/2015) for UA and office visit.  Zara Council, [facility] [address] [place], [place] [address] [phone]

## 2015-10-19 ENCOUNTER — Ambulatory Visit: Payer: Medicare Other | Admitting: Urology

## 2015-12-18 ENCOUNTER — Emergency Department
Admission: EM | Admit: 2015-12-18 | Discharge: 2015-12-18 | Payer: Medicare Other | Attending: Emergency Medicine | Admitting: Emergency Medicine

## 2015-12-18 ENCOUNTER — Emergency Department: Payer: Medicare Other

## 2015-12-18 ENCOUNTER — Encounter: Payer: Self-pay | Admitting: Emergency Medicine

## 2015-12-18 DIAGNOSIS — R0602 Shortness of breath: Secondary | ICD-10-CM | POA: Diagnosis present

## 2015-12-18 DIAGNOSIS — E039 Hypothyroidism, unspecified: Secondary | ICD-10-CM | POA: Diagnosis not present

## 2015-12-18 DIAGNOSIS — E872 Acidosis, unspecified: Secondary | ICD-10-CM

## 2015-12-18 DIAGNOSIS — K651 Peritoneal abscess: Secondary | ICD-10-CM | POA: Insufficient documentation

## 2015-12-18 DIAGNOSIS — K631 Perforation of intestine (nontraumatic): Secondary | ICD-10-CM | POA: Diagnosis not present

## 2015-12-18 DIAGNOSIS — Z79899 Other long term (current) drug therapy: Secondary | ICD-10-CM | POA: Diagnosis not present

## 2015-12-18 DIAGNOSIS — A419 Sepsis, unspecified organism: Secondary | ICD-10-CM | POA: Insufficient documentation

## 2015-12-18 DIAGNOSIS — Z87891 Personal history of nicotine dependence: Secondary | ICD-10-CM | POA: Insufficient documentation

## 2015-12-18 DIAGNOSIS — R1084 Generalized abdominal pain: Secondary | ICD-10-CM

## 2015-12-18 DIAGNOSIS — IMO0002 Reserved for concepts with insufficient information to code with codable children: Secondary | ICD-10-CM

## 2015-12-18 DIAGNOSIS — N289 Disorder of kidney and ureter, unspecified: Secondary | ICD-10-CM

## 2015-12-18 LAB — COMPREHENSIVE METABOLIC PANEL
ALT: 68 U/L — ABNORMAL HIGH (ref 14–54)
ANION GAP: 17 — AB (ref 5–15)
AST: 35 U/L (ref 15–41)
Albumin: 2.9 g/dL — ABNORMAL LOW (ref 3.5–5.0)
Alkaline Phosphatase: 188 U/L — ABNORMAL HIGH (ref 38–126)
BUN: 82 mg/dL — ABNORMAL HIGH (ref 6–20)
CHLORIDE: 89 mmol/L — AB (ref 101–111)
CO2: 26 mmol/L (ref 22–32)
Calcium: 8.4 mg/dL — ABNORMAL LOW (ref 8.9–10.3)
Creatinine, Ser: 2.03 mg/dL — ABNORMAL HIGH (ref 0.44–1.00)
GFR, EST AFRICAN AMERICAN: 28 mL/min — AB (ref >60)
GFR, EST NON AFRICAN AMERICAN: 25 mL/min — AB (ref >60)
Glucose, Bld: 204 mg/dL — ABNORMAL HIGH (ref 65–99)
POTASSIUM: 3.1 mmol/L — AB (ref 3.5–5.1)
Sodium: 132 mmol/L — ABNORMAL LOW (ref 135–145)
Total Bilirubin: 1.4 mg/dL — ABNORMAL HIGH (ref 0.3–1.2)
Total Protein: 7.9 g/dL (ref 6.5–8.1)

## 2015-12-18 LAB — URINALYSIS COMPLETE WITH MICROSCOPIC (ARMC ONLY)
BILIRUBIN URINE: NEGATIVE
Glucose, UA: NEGATIVE mg/dL
Nitrite: NEGATIVE
PH: 5 (ref 5.0–8.0)
Protein, ur: NEGATIVE mg/dL
Specific Gravity, Urine: 1.014 (ref 1.005–1.030)

## 2015-12-18 LAB — CBC
HEMATOCRIT: 43.3 % (ref 35.0–47.0)
HEMOGLOBIN: 14.9 g/dL (ref 12.0–16.0)
MCH: 30.3 pg (ref 26.0–34.0)
MCHC: 34.5 g/dL (ref 32.0–36.0)
MCV: 87.8 fL (ref 80.0–100.0)
PLATELETS: 506 10*3/uL — AB (ref 150–440)
RBC: 4.94 MIL/uL (ref 3.80–5.20)
RDW: 13.3 % (ref 11.5–14.5)
WBC: 13.2 10*3/uL — AB (ref 3.6–11.0)

## 2015-12-18 LAB — LIPASE, BLOOD: LIPASE: 82 U/L — AB (ref 11–51)

## 2015-12-18 LAB — LACTIC ACID, PLASMA: LACTIC ACID, VENOUS: 1.7 mmol/L (ref 0.5–1.9)

## 2015-12-18 MED ORDER — CEFTRIAXONE SODIUM-DEXTROSE 2-2.22 GM-% IV SOLR
2.0000 g | Freq: Once | INTRAVENOUS | Status: AC
  Administered 2015-12-18: 2 g via INTRAVENOUS
  Filled 2015-12-18: qty 50

## 2015-12-18 MED ORDER — SODIUM CHLORIDE 0.9 % IV BOLUS (SEPSIS): 1000.0000 mL | Freq: Once | INTRAVENOUS | Status: DC

## 2015-12-18 MED ORDER — DEXTROSE 5 % IV SOLN: 2.0000 g | INTRAVENOUS | Status: DC

## 2015-12-18 MED ORDER — SODIUM CHLORIDE 0.9 % IV BOLUS (SEPSIS)
1000.0000 mL | Freq: Once | INTRAVENOUS | Status: AC
  Administered 2015-12-18: 1000 mL via INTRAVENOUS

## 2015-12-18 MED ORDER — METRONIDAZOLE IN NACL 5-0.79 MG/ML-% IV SOLN
500.0000 mg | INTRAVENOUS | Status: AC
  Administered 2015-12-18: 500 mg via INTRAVENOUS
  Filled 2015-12-18 (×2): qty 100

## 2015-12-18 MED ORDER — ONDANSETRON HCL 4 MG/2ML IJ SOLN
4.0000 mg | Freq: Once | INTRAMUSCULAR | Status: AC
  Administered 2015-12-18: 4 mg via INTRAVENOUS
  Filled 2015-12-18: qty 2

## 2015-12-18 MED ORDER — SODIUM CHLORIDE 0.9 % IV BOLUS (SEPSIS)
500.0000 mL | Freq: Once | INTRAVENOUS | Status: AC
  Administered 2015-12-18: 500 mL via INTRAVENOUS

## 2015-12-18 MED ORDER — IOPAMIDOL (ISOVUE-300) INJECTION 61%
15.0000 mL | INTRAVENOUS | Status: AC
  Administered 2015-12-18 (×2): 15 mL via ORAL

## 2015-12-18 NOTE — ED Notes (Signed)
Report given to [facility] in the ED at [facility]

## 2015-12-18 NOTE — ED Triage Notes (Signed)
Pt presents with n/v and shortness of breath for five days. Pt scheduled to have hernia surgery today.

## 2015-12-18 NOTE — Consult Note (Signed)
Date of Consultation:  12/18/2015  Requesting Physician:  Dr. Carrie Mew  Reason for Consultation:  Abdominal pain  History of Present Illness: Elizabeth [name] is a 65 y.o. female who presents with a 1-week history of abdominal pain.  Patient started having pain around 11/20 over the epigastric region.  She initially thought she had a bad flu and missed her PAT appointment at [facility] as she was scheduled for hernia repair today 11/27.  Her pain progressively worsened and was also associated with nausea/vomiting.  She has had several episodes of emesis and has not been able to tolerate much po over the past week.  Over the last couple of days, it seemed that she was somewhat getting better as she was starting to tolerated some liquids, but her pain has been worse again and she started having sweats/chills at home.  Reports she has been having flatus and bowel movements and has had some diarrhea over the past two days.  Denies any chest pain but endorses some shortness of breath due to the pain.  Denies diffuse abdominal pain and it's mostly in the upper abdomen.  Denies dysuria or hematuria but reports dark urine.  In the Emergency Room, her laboratory workup revealed a Cr of 2.03 from baseline ~1, total bilirubin of 1.4, lactic acid of 1.7 after boluses, and a WBC of 13.2.  CT scan revealed an abscess in the gallbladder fossa, possibly indicating bowel perforation.  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   Past Medical History: Past Medical History:  Diagnosis Date  . Anemia   . Anxiety    panic attacks  . Anxiety and depression   . Dyspnea   . Edema   . History of kidney stones 2011  . Hyperglycemia   . Hyperlipidemia   . Hypokalemia   . Hypothyroidism   . Liver cyst   . Memory loss   . Osteopenia   . Ventral hernia       Past Surgical History: Past Surgical History:  Procedure Laterality Date  . ABDOMINAL HYSTERECTOMY    . CESAREAN SECTION    . CHOLECYSTECTOMY OPEN  11/17/2013   with repair of bile duct-Dr. Marina Gravel  . OPEN REDUCTION INTERNAL FIXATION (ORIF) DISTAL RADIAL FRACTURE Right 12/20/2014   Procedure: OPEN REDUCTION INTERNAL FIXATION (ORIF) RIGHT DISTAL RADIAL FRACTURE;  Surgeon: Renette Butters, MD;  Location: [facility];  Service: Orthopedics;  Laterality: Right;  . STOMACH SURGERY      Home Medications: Prior to Admission medications   Medication Sig Start Date End Date Taking? Authorizing Provider  albuterol (PROVENTIL HFA;VENTOLIN HFA) 108 (90 BASE) MCG/ACT inhaler Inhale 2 puffs into the lungs every 6 (six) hours as needed for wheezing or shortness of breath. 11/03/13  Yes Juanito Doom, MD  atorvastatin (LIPITOR) 20 MG tablet Take 1 tablet by mouth daily.   Yes Historical Provider, MD  calcium carbonate (OS-CAL - DOSED IN MG OF ELEMENTAL CALCIUM) 1250 (500 CA) MG tablet Take by mouth.   Yes Historical Provider, MD  citalopram (CELEXA) 20 MG tablet Take by mouth.   Yes Historical Provider, MD  clonazePAM (KLONOPIN) 1 MG tablet Take 1 mg by mouth 2 (two) times daily.   Yes Historical Provider, MD  docusate sodium (COLACE) 100 MG capsule Take 1 capsule (100 mg total) by mouth 2 (two) times daily. 12/20/14  Yes Lovett Calender, PA-C  Ferrous Fumarate (HEMOCYTE - 106 MG FE) 324 (106 Fe) MG TABS tablet Take by mouth. Reported on 04/13/2015   Yes Historical Provider, MD  levothyroxine (SYNTHROID, LEVOTHROID) 88 MCG tablet Take by mouth.   Yes Historical Provider, MD  polyethylene glycol (MIRALAX / GLYCOLAX) packet Take by mouth.   Yes Historical Provider, MD  Probiotic Product (PROBIOTIC & ACIDOPHILUS EX ST PO) Take by mouth.   Yes Historical Provider, MD  Vitamin D, Ergocalciferol, (DRISDOL) 50000 UNITS CAPS capsule Take by mouth.   Yes Historical Provider, MD     Allergies: Allergies  Allergen Reactions  . Oxycodone-Acetaminophen Hives    "has been taking without any problem"  . Penicillins Hives    Hives Hives     Social History:  reports that she quit smoking about 42 years ago. She smoked 0.00 packs per day for 0.00 years. She has never used smokeless tobacco. She reports that she does not drink alcohol or use drugs.   Family History: Family History  Problem Relation Age of Onset  . Asthma Daughter   . Heart disease Father   . Cancer Paternal Grandmother     breast  . Cancer Paternal Aunt     breast  . Kidney disease Paternal Aunt   . Bladder Cancer Neg Hx     Review of Systems: Review of Systems  Constitutional: Positive for chills. Negative for fever.  HENT: Negative for hearing loss.   Eyes: Negative for blurred vision.  Respiratory: Negative for cough and shortness of breath.   Cardiovascular: Negative for chest pain and leg swelling.  Gastrointestinal: Positive for abdominal pain, diarrhea and vomiting. Negative for blood in stool, constipation and heartburn.  Genitourinary: Negative for dysuria.  Musculoskeletal: Negative for myalgias.  Skin: Negative for rash.  Neurological: Negative for dizziness.  Psychiatric/Behavioral: Negative for depression.  All other systems reviewed and are negative.   Physical Exam BP 99/72   Pulse (!) 105   Temp 97.9 F (36.6 C) (Oral)   Resp 20   Ht 5\' 1"  (1.549 m)   Wt 67.1 kg (148 lb)   SpO2 97%   BMI 27.96 kg/m  CONSTITUTIONAL: No acute distress HEENT:  Normocephalic, atraumatic, extraocular motion intact. NECK: Trachea is midline, and there is no jugular venous distension.  RESPIRATORY:  Lungs are clear, and breath sounds are equal bilaterally. Normal respiratory effort without pathologic use of accessory muscles. CARDIOVASCULAR: Heart is regular without murmurs, gallops, or rubs, though was initially tachycardic on admission to the ED. GI: The abdomen is soft, mild  distended, with tenderness to palpation in the epigastric and RUQ areas.  No diffuse tenderness or peritoneal signs.  MUSCULOSKELETAL:  Normal muscle strength and tone in all four extremities.  No peripheral edema or cyanosis. SKIN: Skin turgor is normal. There are no pathologic skin lesions.  NEUROLOGIC:  Motor and sensation is grossly normal.  Cranial nerves are grossly intact. PSYCH:  Alert and oriented to person, place and time. Affect is normal.  Laboratory Analysis: Results for orders placed or performed during the hospital encounter of 12/18/15 (from the past 24 hour(s))  Lipase, blood     Status: Abnormal   Collection Time: 12/18/15  7:52 AM  Result Value Ref Range   Lipase 82 (H) 11 - 51 U/L  Comprehensive metabolic panel     Status: Abnormal   Collection Time: 12/18/15  7:52 AM  Result Value Ref Range   Sodium 132 (L) 135 - 145 mmol/L  Potassium 3.1 (L) 3.5 - 5.1 mmol/L   Chloride 89 (L) 101 - 111 mmol/L   CO2 26 22 - 32 mmol/L   Glucose, Bld 204 (H) 65 - 99 mg/dL   BUN 82 (H) 6 - 20 mg/dL   Creatinine, Ser 2.03 (H) 0.44 - 1.00 mg/dL   Calcium 8.4 (L) 8.9 - 10.3 mg/dL   Total Protein 7.9 6.5 - 8.1 g/dL   Albumin 2.9 (L) 3.5 - 5.0 g/dL   AST 35 15 - 41 U/L   ALT 68 (H) 14 - 54 U/L   Alkaline Phosphatase 188 (H) 38 - 126 U/L   Total Bilirubin 1.4 (H) 0.3 - 1.2 mg/dL   GFR calc non Af Amer 25 (L) >60 mL/min   GFR calc Af Amer 28 (L) >60 mL/min   Anion gap 17 (H) 5 - 15  CBC     Status: Abnormal   Collection Time: 12/18/15  7:52 AM  Result Value Ref Range   WBC 13.2 (H) 3.6 - 11.0 K/uL   RBC 4.94 3.80 - 5.20 MIL/uL   Hemoglobin 14.9 12.0 - 16.0 g/dL   HCT 43.3 35.0 - 47.0 %   MCV 87.8 80.0 - 100.0 fL   MCH 30.3 26.0 - 34.0 pg   MCHC 34.5 32.0 - 36.0 g/dL   RDW 13.3 11.5 - 14.5 %   Platelets 506 (H) 150 - 440 K/uL  Urinalysis complete, with microscopic     Status: Abnormal   Collection Time: 12/18/15 12:12 PM  Result Value Ref Range   Color, Urine YELLOW (A)  YELLOW   APPearance CLOUDY (A) CLEAR   Glucose, UA NEGATIVE NEGATIVE mg/dL   Bilirubin Urine NEGATIVE NEGATIVE   Ketones, ur TRACE (A) NEGATIVE mg/dL   Specific Gravity, Urine 1.014 1.005 - 1.030   Hgb urine dipstick 2+ (A) NEGATIVE   pH 5.0 5.0 - 8.0   Protein, ur NEGATIVE NEGATIVE mg/dL   Nitrite NEGATIVE NEGATIVE   Leukocytes, UA 2+ (A) NEGATIVE   RBC / HPF 0-5 0 - 5 RBC/hpf   WBC, UA 0-5 0 - 5 WBC/hpf   Bacteria, UA MANY (A) NONE SEEN   Squamous Epithelial / LPF 0-5 (A) NONE SEEN   Mucous PRESENT   Lactic acid, plasma     Status: None   Collection Time: 12/18/15 12:12 PM  Result Value Ref Range   Lactic Acid, Venous 1.7 0.5 - 1.9 mmol/L    Imaging: Ct Abdomen Pelvis Wo Contrast  Result Date: 12/18/2015 CLINICAL DATA:  multiple previous abdominal operations. 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 Pt presents with n/v and shortness of breath for five days. Pt scheduled to have hernia surgery today. Oral contrast due to elevated creat. Abnormal abdominal x-ray. Elevated creatinine. EXAM: CT CHEST, ABDOMEN AND PELVIS WITHOUT CONTRAST TECHNIQUE: Multidetector CT imaging of the chest, abdomen and pelvis was performed following the standard protocol without IV contrast. COMPARISON:  11/10/2013 FINDINGS: CT CHEST FINDINGS Cardiovascular: Heart size normal. Scattered coronary calcifications. No thoracic aortic aneurysm. Dilated central pulmonary arteries suggesting pulmonary hypertension. Mediastinum/Nodes: No mediastinal hematoma. No adenopathy. Mild circumferential diffuse esophageal wall thickening. No pericardial effusion. Lungs/Pleura: Trace right pleural effusion. No pneumothorax. Patchy subsegmental atelectasis/consolidation inferiorly in the right middle lobe and in basilar segments of the bilateral lower lobes. There is some debris in central right middle lobe airways. Musculoskeletal: Anterior bridging osteophytes at multiple  levels in the mid and lower thoracic spine. Negative  for fracture or other worrisome lesion. CT ABDOMEN PELVIS FINDINGS Hepatobiliary: Previous cholecystectomy. Poorly marginated gas and fluid collection in the gallbladder fossa extending anterior to the right lobe of the liver. Stable 3.3 cm subcapsular cyst in hepatic segment 5. No new liver lesion is evident. Pancreas: Mild diffuse atrophy without discrete mass or ductal dilatation. Spleen: Normal in size without focal abnormality. Adrenals/Urinary Tract: Adrenal glands are unremarkable. Kidneys are normal, without renal calculi, focal lesion, or hydronephrosis. Bladder is unremarkable. Stomach/Bowel: Marked gastric distention by ingested material. There is dilatation of the duodenal bulb and proximal duodenum, this was present previously but has progressed since scan of 11/10/2013. Distal duodenum is decompressed. There is a prominent duodenal diverticulum as before. There multiple dilated proximal small bowel loops with poor distal progression of oral contrast material. No discrete transition point. The distal most a small bowel loops however are not decompressed. The colon is nondistended. Appendix apparently surgically absent. Vascular/Lymphatic: Adenopathy in the gastrohepatic ligament 2.4 cm. Low-attenuation in the porta hepatis may be related tortuous dilated common duct although I can't confidently exclude adenopathy here is well. No retroperitoneal or pelvic adenopathy. Reproductive: Status post hysterectomy. No adnexal masses. Other: No ascites.  No free air. Musculoskeletal: Ventral hernia containing only mesenteric fat. IMPRESSION: 1. Probable small abscess in the gallbladder fossa with bubbles of gas and poorly marginated fluid. Regional adenopathy suggests either chronic/indolent process versus neoplasm, possibly perforated. Given the patient's aberrant upper abdominal anatomy, MR abdomen with contrast may be useful for further pathologic and  anatomic characterization. The findings were reviewed with Dr. Kris Hartmann, who concurs. 2. Distended proximal small bowel loops. In the absence of a discrete transition point, favor ileus over obstruction. 3. Subsegmental atelectasis/consolidation at both lung bases, right worse than left. 4. Diffuse esophageal wall thickening suggesting esophagitis. 5. Ventral hernia containing only mesenteric fat. Electronically Signed   By: Lucrezia Europe M.D.   On: 12/18/2015 11:32   Dg Chest 2 View  Result Date: 12/18/2015 CLINICAL DATA:  Cough, dyspnea and tachycardia. EXAM: CHEST  2 VIEW COMPARISON:  10/29/2011 FINDINGS: There is lucency near the right hemidiaphragm which raises concern for free intraperitoneal air. In addition, there is an air-fluid level in the left upper abdomen which may be related to the stomach. There is marked elevation of the right hemidiaphragm that appears to be chronic. There is no significant airspace disease or pulmonary edema. No large pleural effusions. Cardiac silhouette is upper limits of normal. Multilevel degenerative changes in thoracic spine. IMPRESSION: Concern for free intraperitoneal air. Recommend further evaluation with CT or decubitus abdominal image. Elevated right hemidiaphragm which is chronic. No focal airspace disease or pulmonary edema. These results were called by telephone at the time of interpretation on 12/18/2015 at 9:11 am to Dr. Carrie Mew , who verbally acknowledged these results. Electronically Signed   By: Markus Daft M.D.   On: 12/18/2015 09:11   Ct Chest Wo Contrast  Result Date: 12/18/2015 CLINICAL DATA:  multiple previous abdominal operations. 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 Pt presents with n/v and shortness of breath for five days. Pt scheduled to have hernia surgery today. Oral contrast due to elevated creat. Abnormal abdominal x-ray. Elevated creatinine. EXAM: CT CHEST, ABDOMEN AND PELVIS  WITHOUT CONTRAST TECHNIQUE: Multidetector CT imaging of the chest, abdomen and pelvis was performed following the standard protocol without IV contrast. COMPARISON:  11/10/2013 FINDINGS: CT CHEST FINDINGS Cardiovascular: Heart size normal. Scattered coronary calcifications.  No thoracic aortic aneurysm. Dilated central pulmonary arteries suggesting pulmonary hypertension. Mediastinum/Nodes: No mediastinal hematoma. No adenopathy. Mild circumferential diffuse esophageal wall thickening. No pericardial effusion. Lungs/Pleura: Trace right pleural effusion. No pneumothorax. Patchy subsegmental atelectasis/consolidation inferiorly in the right middle lobe and in basilar segments of the bilateral lower lobes. There is some debris in central right middle lobe airways. Musculoskeletal: Anterior bridging osteophytes at multiple levels in the mid and lower thoracic spine. Negative for fracture or other worrisome lesion. CT ABDOMEN PELVIS FINDINGS Hepatobiliary: Previous cholecystectomy. Poorly marginated gas and fluid collection in the gallbladder fossa extending anterior to the right lobe of the liver. Stable 3.3 cm subcapsular cyst in hepatic segment 5. No new liver lesion is evident. Pancreas: Mild diffuse atrophy without discrete mass or ductal dilatation. Spleen: Normal in size without focal abnormality. Adrenals/Urinary Tract: Adrenal glands are unremarkable. Kidneys are normal, without renal calculi, focal lesion, or hydronephrosis. Bladder is unremarkable. Stomach/Bowel: Marked gastric distention by ingested material. There is dilatation of the duodenal bulb and proximal duodenum, this was present previously but has progressed since scan of 11/10/2013. Distal duodenum is decompressed. There is a prominent duodenal diverticulum as before. There multiple dilated proximal small bowel loops with poor distal progression of oral contrast material. No discrete transition point. The distal most a small bowel loops however are  not decompressed. The colon is nondistended. Appendix apparently surgically absent. Vascular/Lymphatic: Adenopathy in the gastrohepatic ligament 2.4 cm. Low-attenuation in the porta hepatis may be related tortuous dilated common duct although I can't confidently exclude adenopathy here is well. No retroperitoneal or pelvic adenopathy. Reproductive: Status post hysterectomy. No adnexal masses. Other: No ascites.  No free air. Musculoskeletal: Ventral hernia containing only mesenteric fat. IMPRESSION: 1. Probable small abscess in the gallbladder fossa with bubbles of gas and poorly marginated fluid. Regional adenopathy suggests either chronic/indolent process versus neoplasm, possibly perforated. Given the patient's aberrant upper abdominal anatomy, MR abdomen with contrast may be useful for further pathologic and anatomic characterization. The findings were reviewed with Dr. Kris Hartmann, who concurs. 2. Distended proximal small bowel loops. In the absence of a discrete transition point, favor ileus over obstruction. 3. Subsegmental atelectasis/consolidation at both lung bases, right worse than left. 4. Diffuse esophageal wall thickening suggesting esophagitis. 5. Ventral hernia containing only mesenteric fat. Electronically Signed   By: Lucrezia Europe M.D.   On: 12/18/2015 11:32    Assessment and Plan: This is a 65 y.o. female who presents with abdominal pain for 1 week, with acute renal failure due to dehydration, and CT scan concerning for possible duodenal perforation with abscess.  I have personally reviewed the patient's laboratory and imaging studies and have discussed them with the patient.  This is a very surgically complex patient with history of multiple abdominal surgeries and abnormal anatomy.  She also has a known duodenal diverticulum which is larger on this CT scan.  She has had an open cholecystectomy with CBD repair due to injury intraop.  Based on the CT scan, it is unclear if her perforation may be at  the diverticulum or the duodenum itself.  Given her complexity, the patient should be transferred to a tertiary care center.  The patient has had her surgical care at [facility] and she would like to be transferred there.   Melvyn Neth, [place]

## 2015-12-18 NOTE — ED Notes (Signed)
Pt presents with n/v x 5 days. She also c/o sob x several days. Pt on 2L O2 upon being roomed. MD Joni Fears at bedside, trialed pt on RA, O2 sat dropped to 94%, but pt stated she felt worse when it was turned off so Dr. Joni Fears turned O2 back to 2L. Pt's husband states that she has kept nothing but water down since Wednesday and that this morning is the longest stretch she has been without vomiting.

## 2015-12-18 NOTE — ED Notes (Signed)
Pt transferred to [facility] via [facility]

## 2015-12-18 NOTE — ED Provider Notes (Signed)
[facility] Emergency Department Provider Note  ____________________________________________  Time seen: Approximately 2:10 PM  I have reviewed the triage vital signs and the nursing notes.   HISTORY  Chief Complaint Shortness of Breath; Nausea; and Emesis    HPI Elizabeth [name] is a 65 y.o. female comes to the ED today complaining of shortness of breath for the past several days. Also complains of worsening abdominal pain and nausea and oral intolerance for the past week. Reports that she was exposed to have an elective hernia repair at [facility] today with Dr.  Algie Coffer because she was feeling sick she canceled this and came to the ED for evaluation. She does not normally wear oxygen at home.  Has a history of open cholecystectomy performed here by Dr. Felton Clinton 2 years ago. 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  Description of anatomy From Dr. Algernon Huxley operative note from October 2015: [place]:  With informed consent, supine position, and general endotracheal anesthesia, the patient's abdomen was widely prepped utilizing ChloraPrep solution. Timeout was observed. Open technique was used to place a transversely-oriented skin incision through the umbilicus with stay sutures being passed through the fascia. Pneumoperitoneum was established. There were dense adhesions of the omentum and stomach to the anterior abdominal wall. Two 5 mm trocars were placed in the left upper quadrant to take these adhesions down, and this was accomplished with sharp technique and point cautery on punctate bleeding. The gastric mid body was densely adherent to the anterior abdominal wall just underneath this right paramedian incision, which was taken down with sharp dissection and no evidence of gastrotomy. Further dissection demonstrated the gallbladder to be present in the right upper quadrant; however, dissection approximately 45 minutes into  the operation failed to demonstrate any clear evidence of Hartmann pouch. Given her history of unclear anatomy and dilated ducts I felt further attempts at laparoscopic dissection unwise.  At this point an open operation was performed, and as such, a right upper quadrant Kocher incision was fashioned with a scalpel and electrocautery through musculofascial layers. A self-retaining abdominal retractor was placed. Two laparotomy tapes were then placed behind the right lobe of the liver. The gallbladder was taken down in a dome down fashion. The cystic artery was doubly ligated with silk suture. A cholangiogram was attempted, however, could only fill the duodenal diverticulum  cystic duct common bile duct junction was identified. There was filling into the duodenum. The cystic duct and common bile duct junction was identified. During the process of this, I discovered a small amount of leak on the anterior surface of the duodenum of clear bile, and this was inspected more closely. In fact, a large 1 cm bile duct was anterior to the duodenum in a very aberrant location, and a small, 1 mm defect was closed with three 5-0 PDS sutures. The repair appeared to be intact. The cystic duct was transected utilizing a single fire of the 35 mm blue load stapler and submitted as specimen.   The right upper quadrant was irrigated thoroughly, and hemostasis was ensured on the operative field, especially around the cystic duct remnant with a 3-0 silk suture in figure-of-eight orientation. Surgiflo 10 mL with thrombin application was inserted into the gallbladder fossa and cystic duct remnant. A small amount of fat from the stomach was placed over the bile duct repair and secured to the duodenum.   A 19 mm Blake drain was directed into [employer] and  exited the right lower quadrant.   The infraumbilical fascial defect was reapproximated utilizing the existing stay sutures. Running #1 PDS looped was used in a single layer  to reapproximate the fascial edges. Drain suture was secured with nylon suture. Skin edges were reapproximated utilizing a skin stapler. The remaining local anesthesia totaling 30 mL was infiltrated along all skin and fascial incisions prior to closure. The patient was subsequently extubated and taken to the recovery room in stable and satisfactory condition by anesthesia services.       Past Medical History:  Diagnosis Date  . Anemia   . Anxiety    panic attacks  . Anxiety and depression   . Dyspnea   . Edema   . History of kidney stones 2011  . Hyperglycemia   . Hyperlipidemia   . Hypokalemia   . Hypothyroidism   . Liver cyst   . Memory loss   . Osteopenia   . Ventral hernia      Patient Active Problem List   Diagnosis Date Noted  . Acute cholecystitis 11/04/2014  . Absolute anemia 11/04/2014  . Anxiety 11/04/2014  . Diabetes ([place]) 11/04/2014  . Accumulation of fluid in tissues 11/04/2014  . Fibromyalgia 11/04/2014  . BP (high blood pressure) 11/04/2014  . Blood glucose elevated 11/04/2014  . HLD (hyperlipidemia) 11/04/2014  . Decreased potassium in the blood 11/04/2014  . Adult hypothyroidism 11/04/2014  . Osteopenia 11/04/2014  . Panic attack 11/04/2014  . Incisional hernia, without obstruction or gangrene 08/31/2014  . Microscopic hematuria 08/18/2014  . 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 Dyspnea 11/03/2013  . Restrictive lung disease 11/03/2013  . Abdominal mass 11/03/2013  . Cough 11/03/2013     Past Surgical History:  Procedure Laterality Date  . ABDOMINAL HYSTERECTOMY    . CESAREAN SECTION    . CHOLECYSTECTOMY OPEN  11/17/2013   with repair of bile duct-Dr. Marina Gravel  . OPEN REDUCTION INTERNAL FIXATION (ORIF) DISTAL RADIAL FRACTURE Right 12/20/2014   Procedure: OPEN REDUCTION INTERNAL FIXATION (ORIF) RIGHT DISTAL RADIAL FRACTURE;  Surgeon: Renette Butters, MD;  Location: [facility];  Service:  Orthopedics;  Laterality: Right;  . STOMACH SURGERY       Prior to Admission medications   Medication Sig Start Date End Date Taking? Authorizing Provider  albuterol (PROVENTIL HFA;VENTOLIN HFA) 108 (90 BASE) MCG/ACT inhaler Inhale 2 puffs into the lungs every 6 (six) hours as needed for wheezing or shortness of breath. 11/03/13  Yes Juanito Doom, MD  atorvastatin (LIPITOR) 20 MG tablet Take 1 tablet by mouth daily.   Yes Historical Provider, MD  calcium carbonate (OS-CAL - DOSED IN MG OF ELEMENTAL CALCIUM) 1250 (500 CA) MG tablet Take by mouth.   Yes Historical Provider, MD  citalopram (CELEXA) 20 MG tablet Take by mouth.   Yes Historical Provider, MD  clonazePAM (KLONOPIN) 1 MG tablet Take 1 mg by mouth 2 (two) times daily.   Yes Historical Provider, MD  docusate sodium (COLACE) 100 MG capsule Take 1 capsule (100 mg total) by mouth 2 (two) times daily. 12/20/14  Yes Brittney Claiborne Billings, PA-C  Ferrous Fumarate (HEMOCYTE - 106 MG FE) 324 (106 Fe) MG TABS tablet Take by mouth. Reported on 04/13/2015   Yes Historical Provider, MD  levothyroxine (SYNTHROID, LEVOTHROID) 88 MCG tablet Take by mouth.   Yes Historical Provider, MD  polyethylene glycol (MIRALAX / GLYCOLAX) packet Take by mouth.   Yes Historical Provider, MD  Probiotic Product (  PROBIOTIC & ACIDOPHILUS EX ST PO) Take by mouth.   Yes Historical Provider, MD  Vitamin D, Ergocalciferol, (DRISDOL) 50000 UNITS CAPS capsule Take by mouth.   Yes Historical Provider, MD     Allergies Oxycodone-acetaminophen and Penicillins   Family History  Problem Relation Age of Onset  . Asthma Daughter   . Heart disease Father   . Cancer Paternal Grandmother     breast  . Cancer Paternal Aunt     breast  . Kidney disease Paternal Aunt   . Bladder Cancer Neg Hx     Social History Social History  Substance Use Topics  . Smoking status: Former Smoker    Packs/day: 0.00    Years: 0.00    Quit date: 11/03/1973  . Smokeless tobacco: Never Used      Comment: Smoked briefly as a teen- lives in secondhand smoke.   Marland Kitchen Alcohol use No    Review of Systems  Constitutional:   No fever. Positive chills.  ENT:   No sore throat. No rhinorrhea. Cardiovascular:   No chest pain. Respiratory:   Positive shortness of breath without cough. Gastrointestinal:   Positive upper abdominal pain..  Genitourinary:   Negative for dysuria or difficulty urinating. Musculoskeletal:   Negative for focal pain or swelling Neurological:   Negative for headaches 10-point ROS otherwise negative.  ____________________________________________   PHYSICAL EXAM:  VITAL SIGNS: ED Triage Vitals  Enc Vitals Group     BP 12/18/15 0750 105/72     Pulse Rate 12/18/15 0750 (!) 123     Resp 12/18/15 0750 16     Temp 12/18/15 0750 97.9 F (36.6 C)     Temp Source 12/18/15 0750 Oral     SpO2 12/18/15 0750 93 %     Weight 12/18/15 0748 148 lb (67.1 kg)     Height 12/18/15 0748 5\' 1"  (1.549 m)     Head Circumference --      Peak Flow --      Pain Score --      Pain Loc --      Pain Edu? --      Excl. in [place]? --     Vital signs reviewed, nursing assessments reviewed.   Constitutional:   Alert and oriented. Ill-appearing. Eyes:   No scleral icterus. No conjunctival pallor. PERRL. EOMI.  No nystagmus. ENT   Head:   Normocephalic and atraumatic.   Nose:   No congestion/rhinnorhea. No septal hematoma   Mouth/Throat:   Dry mucous membranes, no pharyngeal erythema. No peritonsillar mass.    Neck:   No stridor. No SubQ emphysema. No meningismus. Hematological/Lymphatic/Immunilogical:   No cervical lymphadenopathy. Cardiovascular:   Tachycardia heart rate 120. Symmetric bilateral radial and DP pulses.  No murmurs.  Respiratory:   Normal respiratory effort without tachypnea nor retractions. Breath sounds are clear and equal bilaterally. No wheezes/rales/rhonchi. Gastrointestinal:   Soft with diffuse upper abdominal tenderness.. Non distended. There is  no CVA tenderness.  No rebound, rigidity, or guarding. Genitourinary:   deferred Musculoskeletal:   Nontender with normal range of motion in all extremities. No joint effusions.  No lower extremity tenderness.  No edema. Neurologic:   Normal speech and language.  CN 2-10 normal. Motor grossly intact. No gross focal neurologic deficits are appreciated.  Skin:    Skin is warm, dry and intact. No rash noted.  No petechiae, purpura, or bullae.  ____________________________________________    LABS (pertinent positives/negatives) (all labs ordered are listed, but only abnormal results  are displayed) Labs Reviewed  LIPASE, BLOOD - Abnormal; Notable for the following:       Result Value   Lipase 82 (*)    All other components within normal limits  COMPREHENSIVE METABOLIC PANEL - Abnormal; Notable for the following:    Sodium 132 (*)    Potassium 3.1 (*)    Chloride 89 (*)    Glucose, Bld 204 (*)    BUN 82 (*)    Creatinine, Ser 2.03 (*)    Calcium 8.4 (*)    Albumin 2.9 (*)    ALT 68 (*)    Alkaline Phosphatase 188 (*)    Total Bilirubin 1.4 (*)    GFR calc non Af Amer 25 (*)    GFR calc Af Amer 28 (*)    Anion gap 17 (*)    All other components within normal limits  CBC - Abnormal; Notable for the following:    WBC 13.2 (*)    Platelets 506 (*)    All other components within normal limits  URINALYSIS COMPLETEWITH MICROSCOPIC ([facility] ONLY) - Abnormal; Notable for the following:    Color, Urine YELLOW (*)    APPearance CLOUDY (*)    Ketones, ur TRACE (*)    Hgb urine dipstick 2+ (*)    Leukocytes, UA 2+ (*)    Bacteria, UA MANY (*)    Squamous Epithelial / LPF 0-5 (*)    All other components within normal limits  CULTURE, BLOOD (ROUTINE X 2)  CULTURE, BLOOD (ROUTINE X 2)  LACTIC ACID, PLASMA  LACTIC ACID, PLASMA   ____________________________________________   EKG  Interpreted by me Sinus tachycardia rate 122, normal axis and intervals. Incomplete right bundle branch  block. Normal ST segments and T waves.  ____________________________________________    RADIOLOGY  Chest x-ray shows unremarkable lungs, but concerning for free air under the right hemidiaphragm. Discussed with radiology. CT chest abdomen pelvis shows possible small bowel perforation in the area of the duodenum in the right upper quadrant with resulting intra-abdominal abscess.  ____________________________________________   PROCEDURES Procedures CRITICAL CARE Performed by: Joni Fears, Anselma Herbel   Total critical care time: 35 minutes  Critical care time was exclusive of separately billable procedures and treating other patients.  Critical care was necessary to treat or prevent imminent or life-threatening deterioration.  Critical care was time spent personally by me on the following activities: development of treatment plan with patient and/or surrogate as well as nursing, discussions with consultants, evaluation of patient's response to treatment, examination of patient, obtaining history from patient or surrogate, ordering and performing treatments and interventions, ordering and review of laboratory studies, ordering and review of radiographic studies, pulse oximetry and re-evaluation of patient's condition.  ____________________________________________   INITIAL IMPRESSION / ASSESSMENT AND PLAN / ED COURSE  Pertinent labs & imaging results that were available during my care of the patient were reviewed by me and considered in my medical decision making (see chart for details).  Patient presents with abdominal pain, tachycardia, clinically dehydrated. Her shortness of breath appears to be secondary to metabolic acidosis, low suspicion for ACS PE or dissection. No evidence of pneumothorax. After chest x-ray finding, CT scan ordered.   Clinical Course as of Dec 17 1408  Mon Dec 18, 2015  1149 CT suggests possible perforation with abscess formation. Will consult surgery. Pcn  allergic. Ceftriaxone and flagyl.  CT Abdomen Pelvis Wo Contrast [PS]    Clinical Course User Index [PS] Carrie Mew, MD   ______  -----------------------------------------  2:18 PM on 12/18/2015 -----------------------------------------  CT results reviewed, surgery consult obtained, discussed with Dr. Roderick Pee later after his evaluation in the ED. 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 Patient is already established with [facility]. We'll pursue transfer to [facility]. Vital signs remained stable, tachycardia improved with 30 mL per kilo bolus given.  ----------------------------------------- 2:43 PM on 12/18/2015 -----------------------------------------  Discussed with [facility] surgery Dr. Flavia Shipper, accepts for transfer to [facility] ED for further evaluation. Patient is medically stabilized for transport.   ______________________________________   FINAL CLINICAL IMPRESSION(S) / ED DIAGNOSES  Final diagnoses:  Generalized abdominal pain  Acute renal insufficiency  Small bowel perforation (HCC)  Abdominal abscess (HCC)  Metabolic acidosis  Sepsis, due to unspecified organism [facility])       Portions of this note were generated with dragon dictation software. Dictation errors may occur despite best attempts at proofreading.    Carrie Mew, MD 12/18/15 1444

## 2015-12-18 NOTE — ED Notes (Signed)
Pt returned from ct

## 2015-12-18 NOTE — ED Notes (Signed)
Called EMS for transport [phone]

## 2015-12-18 NOTE — ED Notes (Signed)
02 2L APPLIED TO SUB WAIT.

## 2015-12-23 LAB — CULTURE, BLOOD (ROUTINE X 2)
CULTURE: NO GROWTH
Culture: NO GROWTH

## 2016-01-10 DIAGNOSIS — K750 Abscess of liver: Secondary | ICD-10-CM | POA: Insufficient documentation

## 2016-01-10 DIAGNOSIS — K56609 Unspecified intestinal obstruction, unspecified as to partial versus complete obstruction: Secondary | ICD-10-CM | POA: Insufficient documentation

## 2016-01-10 DIAGNOSIS — K922 Gastrointestinal hemorrhage, unspecified: Secondary | ICD-10-CM | POA: Insufficient documentation

## 2016-06-13 ENCOUNTER — Other Ambulatory Visit: Payer: Self-pay | Admitting: Medical Oncology

## 2016-06-13 DIAGNOSIS — M25561 Pain in right knee: Principal | ICD-10-CM

## 2016-06-13 DIAGNOSIS — G8929 Other chronic pain: Secondary | ICD-10-CM

## 2016-06-19 ENCOUNTER — Ambulatory Visit
Admission: RE | Admit: 2016-06-19 | Discharge: 2016-06-19 | Disposition: A | Discharge status: Home / Self Care | Payer: Medicare Other | Source: Ambulatory Visit | Attending: Medical Oncology | Admitting: Medical Oncology

## 2016-06-19 DIAGNOSIS — M25561 Pain in right knee: Secondary | ICD-10-CM | POA: Insufficient documentation

## 2016-06-19 DIAGNOSIS — G8929 Other chronic pain: Secondary | ICD-10-CM | POA: Insufficient documentation

## 2016-12-03 DIAGNOSIS — K219 Gastro-esophageal reflux disease without esophagitis: Secondary | ICD-10-CM | POA: Insufficient documentation

## 2017-11-05 ENCOUNTER — Encounter: Payer: Self-pay | Admitting: Emergency Medicine

## 2017-11-05 ENCOUNTER — Other Ambulatory Visit: Payer: Self-pay

## 2017-11-05 ENCOUNTER — Emergency Department
Admission: EM | Admit: 2017-11-05 | Discharge: 2017-11-05 | Disposition: A | Discharge status: Home / Self Care | Payer: Medicare Other | Attending: Emergency Medicine | Admitting: Emergency Medicine

## 2017-11-05 ENCOUNTER — Emergency Department: Payer: Medicare Other

## 2017-11-05 DIAGNOSIS — S92531A Displaced fracture of distal phalanx of right lesser toe(s), initial encounter for closed fracture: Secondary | ICD-10-CM | POA: Diagnosis not present

## 2017-11-05 DIAGNOSIS — E119 Type 2 diabetes mellitus without complications: Secondary | ICD-10-CM | POA: Insufficient documentation

## 2017-11-05 DIAGNOSIS — Y93E5 Activity, floor mopping and cleaning: Secondary | ICD-10-CM | POA: Diagnosis not present

## 2017-11-05 DIAGNOSIS — Y999 Unspecified external cause status: Secondary | ICD-10-CM | POA: Diagnosis not present

## 2017-11-05 DIAGNOSIS — W228XXA Striking against or struck by other objects, initial encounter: Secondary | ICD-10-CM | POA: Diagnosis not present

## 2017-11-05 DIAGNOSIS — S99921A Unspecified injury of right foot, initial encounter: Secondary | ICD-10-CM | POA: Diagnosis present

## 2017-11-05 DIAGNOSIS — S92501A Displaced unspecified fracture of right lesser toe(s), initial encounter for closed fracture: Secondary | ICD-10-CM

## 2017-11-05 DIAGNOSIS — Z79899 Other long term (current) drug therapy: Secondary | ICD-10-CM | POA: Diagnosis not present

## 2017-11-05 DIAGNOSIS — E039 Hypothyroidism, unspecified: Secondary | ICD-10-CM | POA: Diagnosis not present

## 2017-11-05 DIAGNOSIS — Y92009 Unspecified place in unspecified non-institutional (private) residence as the place of occurrence of the external cause: Secondary | ICD-10-CM | POA: Insufficient documentation

## 2017-11-05 DIAGNOSIS — Z87891 Personal history of nicotine dependence: Secondary | ICD-10-CM | POA: Insufficient documentation

## 2017-11-05 MED ORDER — LIDOCAINE HCL (PF) 1 % IJ SOLN
5.0000 mL | Freq: Once | INTRAMUSCULAR | Status: AC
  Administered 2017-11-05: 5 mL
  Filled 2017-11-05: qty 5

## 2017-11-05 MED ORDER — HYDROCODONE-ACETAMINOPHEN 5-325 MG PO TABS: 1.0000 | ORAL_TABLET | Freq: Four times a day (QID) | ORAL | 0 refills | Status: DC | PRN

## 2017-11-05 NOTE — ED Provider Notes (Signed)
[facility] Emergency Department Provider Note  ____________________________________________   First MD Initiated Contact with Patient 11/05/17 1404     (approximate)  I have reviewed the triage vital signs and the nursing notes.   HISTORY  Chief Complaint Toe Injury   HPI Elizabeth [name] is a 67 y.o. female presents to the ED with complaint of right toe pain.  Patient states that she was cleaning in the home and hit her toe on a dresser.  She initially went to [facility] who sent her to the ED.  Patient denies any previous injury to her toe.  Past Medical History:  Diagnosis Date  . Anemia   . Anxiety    panic attacks  . Anxiety and depression   . Dyspnea   . Edema   . History of kidney stones 2011  . Hyperglycemia   . Hyperlipidemia   . Hypokalemia   . Hypothyroidism   . Liver cyst   . Memory loss   . Osteopenia   . Ventral hernia     Patient Active Problem List   Diagnosis Date Noted  . Acute cholecystitis 11/04/2014  . Absolute anemia 11/04/2014  . Anxiety 11/04/2014  . Diabetes ([place]) 11/04/2014  . Accumulation of fluid in tissues 11/04/2014  . Fibromyalgia 11/04/2014  . BP (high blood pressure) 11/04/2014  . Blood glucose elevated 11/04/2014  . HLD (hyperlipidemia) 11/04/2014  . Decreased potassium in the blood 11/04/2014  . Adult hypothyroidism 11/04/2014  . Osteopenia 11/04/2014  . Panic attack 11/04/2014  . Incisional hernia, without obstruction or gangrene 08/31/2014  . Microscopic hematuria 08/18/2014  . 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 Dyspnea 11/03/2013  . Restrictive lung disease 11/03/2013  . Abdominal mass 11/03/2013  . Cough 11/03/2013    Past Surgical History:  Procedure Laterality Date  . ABDOMINAL HYSTERECTOMY    . CESAREAN SECTION    . CHOLECYSTECTOMY OPEN  11/17/2013   with repair of bile duct-Dr. Marina Gravel  . OPEN REDUCTION INTERNAL FIXATION (ORIF) DISTAL  RADIAL FRACTURE Right 12/20/2014   Procedure: OPEN REDUCTION INTERNAL FIXATION (ORIF) RIGHT DISTAL RADIAL FRACTURE;  Surgeon: Renette Butters, MD;  Location: [place];  Service: Orthopedics;  Laterality: Right;  . STOMACH SURGERY      Prior to Admission medications   Medication Sig Start Date End Date Taking? Authorizing Provider  atorvastatin (LIPITOR) 20 MG tablet Take 1 tablet by mouth daily.   Yes [provider]  calcium carbonate (OS-CAL - DOSED IN MG OF ELEMENTAL CALCIUM) 1250 (500 CA) MG tablet Take by mouth.   Yes [provider]  clonazePAM (KLONOPIN) 1 MG tablet Take 1 mg by mouth 2 (two) times daily.   Yes [provider]  Ferrous Fumarate (HEMOCYTE - 106 MG FE) 324 (106 Fe) MG TABS tablet Take by mouth. Reported on 04/13/2015   Yes [provider]  levothyroxine (SYNTHROID, LEVOTHROID) 88 MCG tablet Take by mouth.   Yes [provider]  polyethylene glycol (MIRALAX / GLYCOLAX) packet Take by mouth.   Yes [provider]  Vitamin D, Ergocalciferol, (DRISDOL) 50000 UNITS CAPS capsule Take by mouth.   Yes [provider]  albuterol (PROVENTIL HFA;VENTOLIN HFA) 108 (90 BASE) MCG/ACT inhaler Inhale 2 puffs into the lungs every 6 (six) hours as needed for wheezing or shortness of breath. 11/03/13   Juanito Doom, MD  citalopram (CELEXA) 20 MG tablet Take by mouth.    [provider]  docusate sodium (COLACE) 100 MG capsule Take 1 capsule (100 mg total) by mouth 2 (two) times daily. 12/20/14   Lovett Calender, PA-C  HYDROcodone-acetaminophen (NORCO/VICODIN) 5-325 MG tablet Take 1 tablet by mouth every 6 (six) hours as needed for moderate pain. 11/05/17   Johnn Hai, PA-C  Probiotic Product (PROBIOTIC & ACIDOPHILUS EX ST PO) Take by mouth.    [provider]    Allergies Oxycodone-acetaminophen and Penicillins  Family History  Problem Relation Age of Onset  . Asthma Daughter     . Heart disease Father   . Cancer Paternal Grandmother        breast  . Cancer Paternal Aunt        breast  . Kidney disease Paternal Aunt   . Bladder Cancer Neg Hx     Social History Social History   Tobacco Use  . Smoking status: Former Smoker    Packs/day: 0.00    Years: 0.00    Pack years: 0.00    Last attempt to quit: 11/03/1973    Years since quitting: 44.0  . Smokeless tobacco: Never Used  . Tobacco comment: Smoked briefly as a teen- lives in secondhand smoke.   Substance Use Topics  . Alcohol use: No    Alcohol/week: 0.0 standard drinks  . Drug use: No    Review of Systems Constitutional: No fever/chills Cardiovascular: Denies chest pain. Respiratory: Denies shortness of breath. Musculoskeletal: Positive for right fifth toe pain. Skin: Negative for rash. Neurological: Negative for focal weakness or numbness.  ____________________________________________   PHYSICAL EXAM:  VITAL SIGNS: ED Triage Vitals  Enc Vitals Group     BP 11/05/17 1259 116/64     Pulse Rate 11/05/17 1259 79     Resp 11/05/17 1259 16     Temp 11/05/17 1259 98.4 F (36.9 C)     Temp Source 11/05/17 1259 Oral     SpO2 11/05/17 1259 99 %     Weight 11/05/17 1300 146 lb 3.2 oz (66.3 kg)     Height 11/05/17 1300 5\' 1"  (1.549 m)     Head Circumference --      Peak Flow --      Pain Score 11/05/17 1259 0     Pain Loc --      Pain Edu? --      Excl. in [place]? --    Constitutional: Alert and oriented. Well appearing and in no acute distress. Eyes: Conjunctivae are normal.  Head: Atraumatic. Neck: No stridor.   Cardiovascular: Normal rate, regular rhythm. Grossly normal heart sounds.  Good peripheral circulation. Respiratory: Normal respiratory effort.  No retractions. Lungs CTAB. Musculoskeletal: Examination of the right foot shows a injury to the right fifth toe with soft tissue ecchymosis and fifth toe is angulated laterally.  Skin is intact.  Capillary refill is less than 3 seconds.   Motor sensory function intact. Neurologic:  Normal speech and language. No gross focal neurologic deficits are appreciated. No gait instability. Skin:  Skin is warm, dry and intact.  Ecchymosis is noted above. Psychiatric: Mood and affect are normal. Speech and behavior are normal.  ____________________________________________   LABS (all labs ordered are listed, but only abnormal results are displayed)  Labs Reviewed - No data to display  RADIOLOGY  ED MD interpretation:   Right foot x-ray fifth digit with angulated displaced spiral fracture.  Official radiology report(s): Dg Toe 5th Right  Result Date: 11/05/2017 CLINICAL DATA:  Blunt trauma to the  right toe. EXAM: RIGHT FIFTH TOE COMPARISON:  None. FINDINGS: There is a slightly angulated spiral fracture of the distal shaft of the proximal phalanx of the little toe with slight displacement. No other abnormality. IMPRESSION: Slightly angulated slightly displaced spiral fracture of the distal shaft of proximal phalanx of the little toe. Electronically Signed   By: Lorriane Shire M.D.   On: 11/05/2017 14:16    ____________________________________________   PROCEDURES  Procedure(s) performed:   .Ortho Injury Treatment Date/Time: 11/05/2017 3:32 PM Performed by: Johnn Hai, PA-C Authorized by: Johnn Hai, PA-C   Consent:    Consent obtained:  Verbal   Consent given by:  Patient   Risks discussed:  FractureInjury location: toe Location details: right fifth toe Injury type: fracture Fracture type: proximal phalanx Pre-procedure neurovascular assessment: neurovascularly intact Pre-procedure distal perfusion: normal Pre-procedure neurological function: normal Pre-procedure range of motion: reduced Anesthesia: digital block  Anesthesia: Local anesthesia used: yes Local Anesthetic: lidocaine 1% without epinephrine Anesthetic total: 4 mL  Patient sedated: NoManipulation performed: yes Skeletal traction  used: yes Reduction successful: yes Immobilization: tape Patient tolerance: Patient tolerated the procedure well with no immediate complications     Critical Care performed: No  ____________________________________________   INITIAL IMPRESSION / ASSESSMENT AND PLAN / ED COURSE  As part of my medical decision making, I reviewed the following data within the electronic MEDICAL RECORD NUMBER Notes from prior ED visits and [place] Controlled Substance Database  Patient presents with injury to her right fifth toe.  Toe is angulated laterally.  X-ray reveals that she does have a spiral fracture with displacement laterally.  Digital block was performed and digit was manipulated for better alignment.  There was clinically an improvement and postreduction x-rays were not performed.  Toe was taped to the fourth toe and patient was placed in a postop shoe.  She is to follow-up with Dr. Vickki Muff if any continued problems with her toe.  She is also to ice and elevate.  She is given a prescription for Norco if needed for pain.  ____________________________________________   FINAL CLINICAL IMPRESSION(S) / ED DIAGNOSES  Final diagnoses:  Closed fracture of phalanx of right fifth toe, initial encounter     ED Discharge Orders         Ordered    HYDROcodone-acetaminophen (NORCO/VICODIN) 5-325 MG tablet  Every 6 hours PRN     11/05/17 1524           Note:  This document was prepared using Dragon voice recognition software and may include unintentional dictation errors.    Johnn Hai, PA-C 11/05/17 1542    Schuyler Amor, MD 11/06/17 [phone]

## 2017-11-05 NOTE — ED Notes (Signed)
Says stubbed rright 5th toe today and it has deformity.  Bruising noted above the injury as well.

## 2017-11-05 NOTE — ED Triage Notes (Signed)
Pt reports that she stubbed her right 5th digit on a dresser. She went to [facility] and they told her they could not set her toe. She states that her toe is at a 45 degree angle.

## 2017-11-05 NOTE — Discharge Instructions (Addendum)
Call make an appoint with Dr. Vickki Muff if any continued problems with your toe.  Ice and elevation.  Buddy tape the toe for the next 4 weeks for support and protection.  Wooden shoe as needed for protection and also to prevent your toe from bending.  Take hydrocodone as needed for pain.

## 2019-03-05 ENCOUNTER — Ambulatory Visit: Attending: Internal Medicine

## 2019-03-05 ENCOUNTER — Other Ambulatory Visit: Payer: Self-pay

## 2019-03-05 DIAGNOSIS — Z23 Encounter for immunization: Secondary | ICD-10-CM | POA: Insufficient documentation

## 2019-03-05 NOTE — Progress Notes (Signed)
   Covid-19 Vaccination Clinic  Name:  Elizabeth [name]    MRN: [id number] DOB: Nov 07, 1950  03/05/2019  Elizabeth [name] was observed post Covid-19 immunization for 15 minutes without incidence. She was provided with Vaccine Information Sheet and instruction to access the V-Safe system.   Elizabeth [name] was instructed to call 911 with any severe reactions post vaccine: Marland Kitchen Difficulty breathing  . Swelling of your face and throat  . A fast heartbeat  . A bad rash all over your body  . Dizziness and weakness    Immunizations Administered    Name Date Dose VIS Date Route   Pfizer COVID-19 Vaccine 03/05/2019 12:15 PM 0.3 mL 01/01/2019 Intramuscular   Manufacturer: [place]   Lot: [id number]   [place]: [id number]

## 2019-03-31 ENCOUNTER — Ambulatory Visit: Attending: Internal Medicine

## 2019-03-31 DIAGNOSIS — Z23 Encounter for immunization: Secondary | ICD-10-CM | POA: Insufficient documentation

## 2019-03-31 NOTE — Progress Notes (Signed)
   Covid-19 Vaccination Clinic  Name:  Elizabeth [name]    MRN: [id number] DOB: 03/10/1950  03/31/2019  [name] was observed post Covid-19 immunization for 15 minutes without incident. She was provided with Vaccine Information Sheet and instruction to access the V-Safe system.   [name] was instructed to call 911 with any severe reactions post vaccine: Marland Kitchen Difficulty breathing  . Swelling of face and throat  . A fast heartbeat  . A bad rash all over body  . Dizziness and weakness   Immunizations Administered    Name Date Dose VIS Date Route   Pfizer COVID-19 Vaccine 03/31/2019 11:12 AM 0.3 mL 01/01/2019 Intramuscular   Manufacturer: [place]   Lot: [id number]   [place]: [id number]

## 2020-03-22 ENCOUNTER — Ambulatory Visit: Admit: 2020-03-22 | Admitting: Ophthalmology

## 2020-03-22 SURGERY — PHACOEMULSIFICATION, CATARACT, WITH IOL INSERTION
Anesthesia: Topical | Laterality: Right

## 2020-04-05 ENCOUNTER — Ambulatory Visit: Admit: 2020-04-05 | Admitting: Ophthalmology

## 2020-04-05 SURGERY — PHACOEMULSIFICATION, CATARACT, WITH IOL INSERTION
Anesthesia: Topical | Laterality: Left

## 2020-05-15 ENCOUNTER — Other Ambulatory Visit: Payer: Self-pay | Admitting: Family Medicine

## 2020-05-15 DIAGNOSIS — Z780 Asymptomatic menopausal state: Secondary | ICD-10-CM

## 2020-08-17 ENCOUNTER — Other Ambulatory Visit: Payer: Self-pay | Admitting: Family Medicine

## 2020-08-17 DIAGNOSIS — N63 Unspecified lump in unspecified breast: Secondary | ICD-10-CM

## 2020-08-17 DIAGNOSIS — Z91199 Patient's noncompliance with other medical treatment and regimen due to unspecified reason: Secondary | ICD-10-CM | POA: Insufficient documentation

## 2020-08-23 ENCOUNTER — Telehealth: Payer: Self-pay | Admitting: Family Medicine

## 2020-08-23 NOTE — Telephone Encounter (Signed)
Patient would like to speak with someone about recent Hep C diagnosis

## 2020-08-24 ENCOUNTER — Ambulatory Visit: Payer: Self-pay

## 2020-09-03 ENCOUNTER — Other Ambulatory Visit: Payer: Self-pay

## 2020-09-03 ENCOUNTER — Emergency Department (HOSPITAL_COMMUNITY): Payer: Medicare HMO

## 2020-09-03 ENCOUNTER — Emergency Department (HOSPITAL_COMMUNITY)
Admission: EM | Admit: 2020-09-03 | Discharge: 2020-09-04 | Disposition: A | Discharge status: Home / Self Care | Payer: Medicare HMO | Attending: Emergency Medicine | Admitting: Emergency Medicine

## 2020-09-03 ENCOUNTER — Encounter (HOSPITAL_COMMUNITY): Payer: Self-pay

## 2020-09-03 DIAGNOSIS — Z87891 Personal history of nicotine dependence: Secondary | ICD-10-CM | POA: Diagnosis not present

## 2020-09-03 DIAGNOSIS — Y9222 Religious institution as the place of occurrence of the external cause: Secondary | ICD-10-CM | POA: Insufficient documentation

## 2020-09-03 DIAGNOSIS — R55 Syncope and collapse: Secondary | ICD-10-CM | POA: Diagnosis not present

## 2020-09-03 DIAGNOSIS — M545 Low back pain, unspecified: Secondary | ICD-10-CM | POA: Insufficient documentation

## 2020-09-03 DIAGNOSIS — E119 Type 2 diabetes mellitus without complications: Secondary | ICD-10-CM | POA: Diagnosis not present

## 2020-09-03 DIAGNOSIS — F22 Delusional disorders: Secondary | ICD-10-CM | POA: Diagnosis not present

## 2020-09-03 DIAGNOSIS — F600 Paranoid personality disorder: Secondary | ICD-10-CM | POA: Diagnosis not present

## 2020-09-03 DIAGNOSIS — F29 Unspecified psychosis not due to a substance or known physiological condition: Secondary | ICD-10-CM | POA: Insufficient documentation

## 2020-09-03 DIAGNOSIS — F419 Anxiety disorder, unspecified: Secondary | ICD-10-CM | POA: Diagnosis not present

## 2020-09-03 DIAGNOSIS — R079 Chest pain, unspecified: Secondary | ICD-10-CM | POA: Insufficient documentation

## 2020-09-03 DIAGNOSIS — E039 Hypothyroidism, unspecified: Secondary | ICD-10-CM | POA: Insufficient documentation

## 2020-09-03 DIAGNOSIS — S0990XA Unspecified injury of head, initial encounter: Secondary | ICD-10-CM | POA: Diagnosis present

## 2020-09-03 DIAGNOSIS — S0083XA Contusion of other part of head, initial encounter: Secondary | ICD-10-CM | POA: Insufficient documentation

## 2020-09-03 DIAGNOSIS — F4325 Adjustment disorder with mixed disturbance of emotions and conduct: Secondary | ICD-10-CM | POA: Diagnosis not present

## 2020-09-03 DIAGNOSIS — Z20822 Contact with and (suspected) exposure to covid-19: Secondary | ICD-10-CM | POA: Diagnosis not present

## 2020-09-03 DIAGNOSIS — Z79899 Other long term (current) drug therapy: Secondary | ICD-10-CM | POA: Diagnosis not present

## 2020-09-03 DIAGNOSIS — W01198A Fall on same level from slipping, tripping and stumbling with subsequent striking against other object, initial encounter: Secondary | ICD-10-CM | POA: Diagnosis not present

## 2020-09-03 DIAGNOSIS — M542 Cervicalgia: Secondary | ICD-10-CM | POA: Diagnosis not present

## 2020-09-03 DIAGNOSIS — F333 Major depressive disorder, recurrent, severe with psychotic symptoms: Secondary | ICD-10-CM | POA: Insufficient documentation

## 2020-09-03 LAB — CBC
HCT: 36.4 % (ref 36.0–46.0)
Hemoglobin: 11.6 g/dL — ABNORMAL LOW (ref 12.0–15.0)
MCH: 29.5 pg (ref 26.0–34.0)
MCHC: 31.9 g/dL (ref 30.0–36.0)
MCV: 92.6 fL (ref 80.0–100.0)
Platelets: 348 10*3/uL (ref 150–400)
RBC: 3.93 MIL/uL (ref 3.87–5.11)
RDW: 12.7 % (ref 11.5–15.5)
WBC: 8.5 10*3/uL (ref 4.0–10.5)
nRBC: 0 % (ref 0.0–0.2)

## 2020-09-03 LAB — TROPONIN I (HIGH SENSITIVITY)
Troponin I (High Sensitivity): 11 ng/L (ref <18)
Troponin I (High Sensitivity): 12 ng/L (ref <18)

## 2020-09-03 LAB — BASIC METABOLIC PANEL
Anion gap: 7 (ref 5–15)
BUN: 10 mg/dL (ref 8–23)
CO2: 27 mmol/L (ref 22–32)
Calcium: 9.1 mg/dL (ref 8.9–10.3)
Chloride: 106 mmol/L (ref 98–111)
Creatinine, Ser: 0.9 mg/dL (ref 0.44–1.00)
GFR, Estimated: >60 mL/min (ref >60)
Glucose, Bld: 96 mg/dL (ref 70–99)
Potassium: 4 mmol/L (ref 3.5–5.1)
Sodium: 140 mmol/L (ref 135–145)

## 2020-09-03 LAB — RAPID URINE DRUG SCREEN, HOSP PERFORMED
Amphetamines: NOT DETECTED
Barbiturates: NOT DETECTED
Benzodiazepines: NOT DETECTED
Cocaine: NOT DETECTED
Opiates: NOT DETECTED
Tetrahydrocannabinol: NOT DETECTED

## 2020-09-03 LAB — RESP PANEL BY RT-PCR (FLU A&B, COVID) ARPGX2
Influenza A by PCR: NEGATIVE
Influenza B by PCR: NEGATIVE
SARS Coronavirus 2 by RT PCR: NEGATIVE

## 2020-09-03 LAB — CK: Total CK: 81 U/L (ref 38–234)

## 2020-09-03 LAB — CBG MONITORING, ED: Glucose-Capillary: 93 mg/dL (ref 70–99)

## 2020-09-03 MED ORDER — ALBUTEROL SULFATE HFA 108 (90 BASE) MCG/ACT IN AERS: 2.0000 | INHALATION_SPRAY | Freq: Four times a day (QID) | RESPIRATORY_TRACT | Status: DC | PRN

## 2020-09-03 MED ORDER — LEVOTHYROXINE SODIUM 88 MCG PO TABS
88.0000 ug | ORAL_TABLET | Freq: Every day | ORAL | Status: DC
  Administered 2020-09-04: 88 ug via ORAL
  Filled 2020-09-03 (×2): qty 1

## 2020-09-03 MED ORDER — CLONAZEPAM 0.5 MG PO TABS
1.0000 mg | ORAL_TABLET | Freq: Two times a day (BID) | ORAL | Status: DC
  Administered 2020-09-03 – 2020-09-04 (×2): 1 mg via ORAL
  Filled 2020-09-03 (×2): qty 2

## 2020-09-03 MED ORDER — ATORVASTATIN CALCIUM 10 MG PO TABS
20.0000 mg | ORAL_TABLET | Freq: Every day | ORAL | Status: DC
  Administered 2020-09-03 – 2020-09-04 (×2): 20 mg via ORAL
  Filled 2020-09-03 (×2): qty 2

## 2020-09-03 NOTE — ED Notes (Signed)
Pt given saltine crackers. 

## 2020-09-03 NOTE — ED Triage Notes (Signed)
Patient's caregiver went to church at 1000, patient was at her baseline. When caregiver returned at noon, patient was unresponsive on the floor under a desk. Patient woke, was a GCS of 13 for EMS. Has a small contusion above the right eyebrow.
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
## 2020-09-03 NOTE — Discharge Instructions (Addendum)
For your behavioral health needs you are advised to follow up with [facility].  Call them at your earliest opportunity to schedule an intake appointment:       [facility]      [address]      [place], [place] [address]      [phone]

## 2020-09-03 NOTE — ED Notes (Addendum)
Patient moved to bed 49

## 2020-09-03 NOTE — ED Notes (Signed)
Patient upset at being sent to the emergency room, states "I told those stupid EMS not to bring me here." awaiting her daughter
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
## 2020-09-03 NOTE — ED Notes (Signed)
Patient's belongings placed in locker 14, patient in purple scrubs. Awaiting security to wand patient. Patient calm and cooperative.

## 2020-09-03 NOTE — BH Assessment (Addendum)
Comprehensive Clinical Assessment (CCA) Note  09/03/2020 TYLI BARBANO [id number]  Disposition: Earleen Newport, NP recommends patient be held overnight in the ED for observation and stabilization with a re-assessment by psychiatry on 8/15. Tele-sitter recommended for suicide safety precautions. RN notified of disposition via secure chat.  The patient demonstrates the following risk factors for suicide: Chronic risk factors for suicide include: psychiatric disorder of bipolar, schizophrenia and previous suicide attempts 3 weeks ago by overdose . Acute risk factors for suicide include: social withdrawal/isolation. Protective factors for this patient include: positive social support. Considering these factors, the overall suicide risk at this point appears to be moderate. Patient is not appropriate for outpatient follow up.   [place] ED from 09/03/2020 in [place] [facility]        Chief Complaint:  Chief Complaint  Patient presents with   Fall   Head Injury   Visit Diagnosis:  F33.3 MDD, recurrent with psychotic features  Patient is a 70 year old female presenting voluntarily to [facility] ED via EMS after sustaining a head injury. Patient's family report concerns as she has a history of bipolar disorder and schizophrenia and she is non-compliant with medications. No history of such is noted in chart but does have a documented history of depression and anxiety. On this counselor's exam patient is calm and cooperative. She states she fell and hit her head and is in a great deal of pain. She reports a history of Bipolar and Schizophrenia, but stopped taking her medications 3 weeks ago after she attempted an overdose in a suicide attempt. Patient states she was prescribed Paxil and Klonopin. She stopped the Paxil but continues to take the Klonopin. She denies current SI/HI/AVH. She does endorse paranoia and states she believes some of  her neighbors are a prime suspect in an investigation. Patient states she was hospitalized 15 years ago for suicidal ideation following her husband's death. 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 She states she would be interested in a part time job or volunteer work. She denies any alcohol or substance use. Patient reports she would like to have her medications evaluated. She reports only sleeping 3 hours per night and poor appetite.   Collateral information obtained from patient's stepdaughter/ healthcare POA, Ellis Parents, at bedside: Collateral reports today patient was laying on the floor with arms crossed over her chest and her caregiver came home and found her that way. Caregiver thought she was deceased and went to pull her out from under the table and in that process the leaf of the table fell and hit patient in the head. She states patient has a history of schizophrenia and bipolar disorder and has been noncompliant with her psychiatric medications. She states patient was prescribed Paxil by her psychiatrist, Dr. Madelin Headings, but only started taking that 1 month ago. She only took for a couple of days prior to discontinuing. She states despite patient's diagnoses of schizophrenia and bipolar disorder she has never been prescribed a mood stabilizer or antipsychotic. She states patient has also developed some concerning behaviors secondary to her paranoia. She states she roams the neighborhood and attempts to break into cars and people's homes.   CCA Screening, Triage and Referral (STR)  Patient Reported Information How did you hear about [place]? Other (Comment) (EMS)  What Is the  Reason for Your Visit/Call Today? head injury, medication non-compliance  How Long Has This Been Causing You Problems? 1 wk - 1 month  What Do You Feel Would Help You the Most Today? Medication(s)   Have You  Recently Had Any Thoughts About Hurting Yourself? Yes  Are You Planning to Commit Suicide/Harm Yourself At This time? No   Have you Recently Had Thoughts About [place]? No  Are You Planning to Harm Someone at This Time? No  Explanation: No data recorded  Have You Used Any Alcohol or Drugs in the Past 24 Hours? No  How Long Ago Did You Use Drugs or Alcohol? No data recorded What Did You Use and How Much? No data recorded  Do You Currently Have a Therapist/Psychiatrist? Yes  Name of Therapist/Psychiatrist: Dr. Brooks Sailors   Have You Been Recently Discharged From Any Office Practice or Programs? No  Explanation of Discharge From Practice/Program: No data recorded    CCA Screening Triage Referral Assessment Type of Contact: Tele-Assessment  Telemedicine Service Delivery: Telemedicine service delivery: This service was provided via telemedicine using a 2-way, interactive audio and video technology  Is this Initial or Reassessment? Initial Assessment  Date Telepsych consult ordered in [facility]:  09/03/20  Time Telepsych consult ordered in [facility]:  [place]  [facility]  Provider Location: [facility] Assessment Services   Collateral Involvement: healthcare POA- Gladys   Does Patient Have a [place]? No data recorded Name and Contact of Legal Guardian: No data recorded If Minor and Not Living with Parent(s), Who has Custody? No data recorded Is CPS involved or ever been involved? Never  Is APS involved or ever been involved? Never   Patient Determined To Be At Risk for Harm To Self or Others Based on Review of Patient Reported Information or Presenting Complaint? No  Method: No data recorded Availability of Means: No data recorded Intent: No data recorded Notification Required: No data recorded Additional Information for Danger to Others Potential: No data recorded Additional Comments for Danger to Others Potential: No data  recorded Are There Guns or Other Weapons in Your Home? No data recorded Types of Guns/Weapons: No data recorded Are These Weapons Safely Secured?                            No data recorded Who Could Verify You Are Able To Have These Secured: No data recorded Do You Have any Outstanding Charges, Pending Court Dates, Parole/Probation? No data recorded Contacted To Inform of Risk of Harm To Self or Others: No data recorded   Does Patient Present under Involuntary Commitment? No  IVC Papers Initial File Date: No data recorded  [place] of Residence: [place]   Patient Currently Receiving the Following Services: Medication Management   Determination of Need: Urgent (48 hours)   Options For Referral: Medication Management; Outpatient Therapy     CCA Biopsychosocial Patient Reported Schizophrenia/Schizoaffective Diagnosis in Past: Yes   Strengths: supportive family   Mental Health Symptoms Depression:   Difficulty Concentrating; Hopelessness; Increase/decrease in appetite; Weight gain/loss; Tearfulness   Duration of Depressive symptoms:  Duration of Depressive Symptoms: Greater than two weeks   Mania:   Increased Energy; Irritability; Recklessness   Anxiety:    Difficulty concentrating; Irritability; Restlessness; Tension; Worrying   Psychosis:   Delusions   Duration of Psychotic symptoms:  Duration of Psychotic Symptoms: Greater than six months   Trauma:  None   Obsessions:   None   Compulsions:   None   Inattention:   None   Hyperactivity/Impulsivity:   None   Oppositional/Defiant Behaviors:   None   Emotional Irregularity:   Recurrent suicidal behaviors/gestures/threats   Other Mood/Personality Symptoms:  No data recorded   Mental Status Exam Appearance and self-care  Stature:   Average   Weight:   Average weight   Clothing:   Neat/clean   Grooming:   Normal   Cosmetic use:   None   Posture/gait:   Rigid   Motor activity:    Not Remarkable   Sensorium  Attention:   Normal   Concentration:   Normal   Orientation:   X5   Recall/memory:   Normal   Affect and Mood  Affect:   Depressed   Mood:   Depressed   Relating  Eye contact:   None   Facial expression:   Responsive   Attitude toward examiner:   Cooperative   Thought and Language  Speech flow:  Clear and Coherent   Thought content:   Appropriate to Mood and Circumstances   Preoccupation:   None   Hallucinations:   None   Organization:  No data recorded  [employer] of Knowledge:   Good   Intelligence:   Average   Abstraction:   Normal   Judgement:   Impaired   Reality Testing:   Distorted   Insight:   Lacking   Decision Making:   Confused; Impulsive   Social Functioning  Social Maturity:   Impulsive   Social Judgement:   Heedless   Stress  Stressors:   Family conflict; Illness   Coping Ability:   Deficient supports   Skill Deficits:   Decision making   Supports:   Family; Friends/Service system     Religion: Religion/Spirituality Are You A Religious Person?: Yes What is Your Religious Affiliation?: International aid/development worker: Leisure / Recreation Do You Have Hobbies?: No  Exercise/Diet: Exercise/Diet Do You Exercise?: No Have You Gained or Lost A Significant Amount of Weight in the Past Six Months?: Yes-Lost Number of Pounds Lost?:  (UTA) Do You Follow a Special Diet?: No Do You Have Any Trouble Sleeping?: Yes Explanation of Sleeping Difficulties: reports 3 hours of sleep per night   CCA Employment/Education Employment/Work Situation: Employment / Work Situation Employment Situation: Retired Social research officer, government has Been Impacted by Current Illness: No Has Patient ever Been in Passenger transport manager?: No  Education: Education Is Patient Currently Attending School?: No Last Grade Completed: 12 Did You Nutritional therapist?: No Did You Have An Individualized Education  Program (IIEP): No Did You Have Any Difficulty At [employer]?: No Patient's Education Has Been Impacted by Current Illness: No   CCA Family/Childhood History Family and Relationship History: Family history Marital status: Widowed Widowed, when?: 15 years ago Does patient have children?: Yes How many children?: 2 How is patient's relationship with their children?: good relationship  Childhood History:  Childhood History By whom was/is the patient raised?: Both parents Did patient suffer any verbal/emotional/physical/sexual abuse as a child?: No Did patient suffer from severe childhood neglect?: No Has patient ever been sexually abused/assaulted/raped as an adolescent or adult?: No Was the patient ever a victim of a crime or a disaster?: No Witnessed domestic violence?: No Has patient been affected by domestic violence as an adult?: No  Child/Adolescent Assessment:     CCA Substance Use Alcohol/Drug Use: Alcohol / Drug Use Pain Medications: see MAR Prescriptions: see  MAR Over the Counter: see MAR History of alcohol / drug use?: No history of alcohol / drug abuse                         ASAM's:  Six Dimensions of Multidimensional Assessment  Dimension 1:  Acute Intoxication and/or Withdrawal Potential:      Dimension 2:  Biomedical Conditions and Complications:      Dimension 3:  Emotional, Behavioral, or Cognitive Conditions and Complications:     Dimension 4:  Readiness to Change:     Dimension 5:  Relapse, Continued use, or Continued Problem Potential:     Dimension 6:  Recovery/Living Environment:     ASAM Severity Score:    ASAM Recommended Level of Treatment:     Substance use Disorder (SUD)    Recommendations for Services/Supports/Treatments:    Discharge Disposition:    DSM5 Diagnoses: Patient Active Problem List   Diagnosis Date Noted   Acute cholecystitis 11/04/2014   Absolute anemia 11/04/2014   Anxiety 11/04/2014   Diabetes ([place])  11/04/2014   Accumulation of fluid in tissues 11/04/2014   Fibromyalgia 11/04/2014   BP (high blood pressure) 11/04/2014   Blood glucose elevated 11/04/2014   HLD (hyperlipidemia) 11/04/2014   Decreased potassium in the blood 11/04/2014   Adult hypothyroidism 11/04/2014   Osteopenia 11/04/2014   Panic attack 11/04/2014   Incisional hernia, without obstruction or gangrene 08/31/2014   Microscopic hematuria 08/18/2014   Adenoma of right adrenal gland 08/18/2014   Cholangiectasis 12/21/2013   Dyspnea 11/03/2013   Restrictive lung disease 11/03/2013   Abdominal mass 11/03/2013   Cough 11/03/2013     Referrals to Alternative Service(s): Referred to Alternative Service(s):   Place:   Date:   Time:    Referred to Alternative Service(s):   Place:   Date:   Time:    Referred to Alternative Service(s):   Place:   Date:   Time:    Referred to Alternative Service(s):   Place:   Date:   Time:     Orvis Brill, LCSW

## 2020-09-04 ENCOUNTER — Encounter (HOSPITAL_COMMUNITY): Payer: Self-pay | Admitting: Registered Nurse

## 2020-09-04 DIAGNOSIS — F4325 Adjustment disorder with mixed disturbance of emotions and conduct: Secondary | ICD-10-CM

## 2020-09-04 DIAGNOSIS — F22 Delusional disorders: Secondary | ICD-10-CM

## 2020-09-04 DIAGNOSIS — F29 Unspecified psychosis not due to a substance or known physiological condition: Secondary | ICD-10-CM

## 2020-09-04 DIAGNOSIS — S0083XA Contusion of other part of head, initial encounter: Secondary | ICD-10-CM | POA: Diagnosis not present

## 2020-09-04 DIAGNOSIS — F419 Anxiety disorder, unspecified: Secondary | ICD-10-CM

## 2020-09-04 NOTE — ED Notes (Signed)
Pt received dinner tray, ate 100% of the food and made statement "Well I guess if I'm still here tomorrow atleast I'll be able to eat."

## 2020-09-04 NOTE — BH Assessment (Signed)
[place] Assessment Progress Note   Per Shuvon Rankin, NP, this pt does not require psychiatric hospitalization at this time.  Pt is psychiatrically cleared.  Discharge instructions include referral information for [place].  Pt's nurse, Marta Antu, has been notified.  Jalene Mullet, [place] Triage Specialist [phone]

## 2020-09-04 NOTE — ED Notes (Signed)
Patient made her 1st phone call at 1030 am.

## 2020-09-04 NOTE — ED Notes (Signed)
pts rings removed and placed with belongings  2 rings

## 2020-09-04 NOTE — ED Provider Notes (Signed)
Emergency Medicine Observation Re-evaluation Note  Elizabeth [name] is a 70 y.o. female, seen on rounds today.  Pt initially presented to the ED for complaints of Fall and Head Injury Currently, the patient is resting.  Physical Exam  BP 133/72 (BP Location: Right Arm)   Pulse 85   Temp 98.7 F (37.1 C) (Oral)   Resp 16   Ht '5\' 1"'$  (1.549 m) Comment: Simultaneous filing. User may not have seen previous data.  Wt 64 kg   SpO2 100%   BMI 26.66 kg/m  Physical Exam General: resting comfortably, NAD Lungs: normal WOB Psych: currently calm and resting  ED Course / MDM  EKG:EKG Interpretation  Date/Time:  Sunday September 03 2020 14:29:10 EDT Ventricular Rate:  66 PR Interval:  167 QRS Duration: 98 QT Interval:  424 QTC Calculation: 445 R Axis:   69 Text Interpretation: Sinus rhythm T wave inversion aVL new from prior No STEMI Confirmed by Wynona Dove ([id number]) on 09/03/2020 2:53:19 PM  I have reviewed the labs performed to date as well as medications administered while in observation.  Recent changes in the last 24 hours include none.  Plan  Current plan is for psych reevaluation today.  [name] is not under involuntary commitment.     Lorelle Gibbs, DO 09/04/20 1402

## 2020-09-04 NOTE — Social Work (Signed)
CSW met with Pt at bedside. Pt is poor historian. Pt did give verbal permission for CSW to contact sona dn daughter.  CSW called son [name] @ [phone].  Son states that Pt is supplied with food but chooses not to eat.  Son states that Pt makes too much money to qualify for food assistance.  CSW will send resources for in-home care to son's email.

## 2020-09-04 NOTE — ED Notes (Signed)
Report given to Terese Door, RN

## 2020-09-04 NOTE — ED Notes (Signed)
Attempted to call pt's stepdaughter/HPOA, Regino Schultze, who is planning to pick up pt because pt was requesting to know when she is able to leave, rang once and went to voicemail.

## 2020-09-04 NOTE — Consult Note (Addendum)
Telepsych Consultation   Reason for Consult:  History of schizophrenia, bipolar disorder and non compliant with medications Referring Physician:  Jeanell Sparrow, DO Location of Patient:  [facility] ED Location of Provider: Other: [facility]  Patient Identification: Elizabeth [name] MRN:  [id number] Principal Diagnosis: Adjustment disorder with mixed disturbance of emotions and conduct Diagnosis:  Principal Problem:   Adjustment disorder with mixed disturbance of emotions and conduct Active Problems:   Anxiety   Total Time spent with patient: 30 minutes  Subjective:   Elizabeth [name] is a 70 y.o. female patient admitted to [facility] ED after presenting to the ED after a fall and hitting her head.  Also complaints that patient has a history or bipolar disorder and schizophrenia and has been non compliant with medications  HPI:  Elizabeth [name], 70 y.o., female patient seen via tele health by this provider, consulted with Dr. Hampton Abbot; and chart reviewed on 09/04/20.  On evaluation Elizabeth [name] reports she was brought to the hospital after hitting her head.  "I was going under table to hook up the air conditioner; when my care giver came home he found me under the table.  I didn't fall I crawled under the table; but my care giver said he had to break the table to get me out.Patient states that she did hit her head while under the table.  Patient reports that it is just her and the care giver in the room.  States that she has had multiple medical issues within the last several years all dealing with her stomach and intestine.  States she doesn't eat that much because most food go straight through.  States she takes her medications but some days she doesn't because she doesn't want to take on a empty stomach.  Reports she isn't able to get to the grocery store and buy the proper foods needed to cook a meal.  "I don't want to take them on a empty stomach."  Patient states about her stepdaughter  "My step daughter just wants to get me out of her hair to tell the damn truth about it.  She wanted me to go to a psych ward for 3-4 days; she just being overbearing."  Patient states that herr son is her representative and helps to handle her accounts.  I" pay the taxes and he pays the bills."  Patient states she is sleeping without difficulty at home.  Denies auditory and visual hallucinations.  Patient also denies suicidal/homicidal ideation.  States that she does get paranoid sometimes but then started talking about needing help managing her medications, help with meals, and that she would rather go to a group home instead of an assisted living facility.  Patient asked to speak to a social work to help with things she thought she needed in home.  Informed would order social work consult and could give her resources or information on how to go about it.      During evaluation Elizabeth [name] is elevated up in bed in no acute distress.  She is alert, oriented x 4.  Patient is able to give correct answers to questions asked: DOB, Age, Current month, season, current place, current year, city, and state.  Patient did get mixed up on presidents stating that Obama was current president, and Trump was previous president then stated "Elizabeth [name] if I know who the president is right now."  Patient also stated that when she is at home she is "rude, I'm  a real bitch."  Patient was calm and cooperative throughout assessment.  Her mood is anxious but euthymic with congruent affect.  She does not appear to be responding to internal/external stimuli or delusional thoughts.  Patient denies suicidal/self-harm/homicidal ideation, psychosis, and paranoia.  Patient answered question appropriately.  Patient states that her step daughter doesn't like current provider and wants patient to see some one else.  Informed that a list of provider would be given to her.   Past Psychiatric History: Patient reporting bipolar disorder.    Risk  to Self:  Denies Risk to Others:  Denies Prior Inpatient Therapy:   Reports one prior psychiatric hospitalization years ago Prior Outpatient Therapy:  Yes.  Reports that her primary care doctor is handling her psychotropic now  Past Medical History:  Past Medical History:  Diagnosis Date   Anemia    Anxiety    panic attacks   Anxiety and depression    Dyspnea    Edema    History of kidney stones 2011   Hyperglycemia    Hyperlipidemia    Hypokalemia    Hypothyroidism    Liver cyst    Memory loss    Osteopenia    Ventral hernia     Past Surgical History:  Procedure Laterality Date   ABDOMINAL HYSTERECTOMY     CESAREAN SECTION     CHOLECYSTECTOMY OPEN  11/17/2013   with repair of bile duct-Dr. Marina Gravel   OPEN REDUCTION INTERNAL FIXATION (ORIF) DISTAL RADIAL FRACTURE Right 12/20/2014   Procedure: OPEN REDUCTION INTERNAL FIXATION (ORIF) RIGHT DISTAL RADIAL FRACTURE;  Surgeon: Renette Butters, MD;  Location: [facility];  Service: Orthopedics;  Laterality: Right;   STOMACH SURGERY     Family History:  Family History  Problem Relation Age of Onset   Asthma Daughter    Heart disease Father    Cancer Paternal Grandmother        breast   Cancer Paternal Aunt        breast   Kidney disease Paternal Aunt    Bladder Cancer Neg Hx    Family Psychiatric  History: None reported Social History:  Social History   Substance and Sexual Activity  Alcohol Use No   Alcohol/week: 0.0 standard drinks     Social History   Substance and Sexual Activity  Drug Use No    Social History   Socioeconomic History   Marital status: Married    Spouse name: Not on file   Number of children: Not on file   Years of education: Not on file   Highest education level: Not on file  Occupational History   Not on file  Tobacco Use   Smoking status: Former    Packs/day: 0.00    Years: 0.00    Pack years: 0.00    Types: Cigarettes    Quit date: 11/03/1973    Years since  quitting: 46.8   Smokeless tobacco: Never   Tobacco comments:    Smoked briefly as a teen- lives in secondhand smoke.   Substance and Sexual Activity   Alcohol use: No    Alcohol/week: 0.0 standard drinks   Drug use: No   Sexual activity: Not on file  Other Topics Concern   Not on file  Social History Narrative   Not on file   Social Determinants of Health   Financial Resource Strain: Not on file  Food Insecurity: Not on file  Transportation Needs: Not on file  Physical Activity: Not on  file  Stress: Not on file  Social Connections: Not on file   Additional Social History:    Allergies:   Allergies  Allergen Reactions   Oxycodone-Acetaminophen Hives    "has been taking without any problem"   Penicillins Hives    Hives Hives     Labs:  Results for orders placed or performed during the hospital encounter of 09/03/20 (from the past 48 hour(s))  CBG monitoring, ED     Status: None   Collection Time: 09/03/20  2:31 PM  Result Value Ref Range   Glucose-Capillary 93 70 - 99 mg/dL    Comment: Glucose reference range applies only to samples taken after fasting for at least 8 hours.  Basic metabolic panel     Status: None   Collection Time: 09/03/20  2:42 PM  Result Value Ref Range   Sodium 140 135 - 145 mmol/L   Potassium 4.0 3.5 - 5.1 mmol/L   Chloride 106 98 - 111 mmol/L   CO2 27 22 - 32 mmol/L   Glucose, Bld 96 70 - 99 mg/dL    Comment: Glucose reference range applies only to samples taken after fasting for at least 8 hours.   BUN 10 8 - 23 mg/dL   Creatinine, Ser 0.90 0.44 - 1.00 mg/dL   Calcium 9.1 8.9 - 10.3 mg/dL   GFR, Estimated >60 >60 mL/min    Comment: (NOTE) Calculated using the CKD-EPI Creatinine Equation (2021)    Anion gap 7 5 - 15    Comment: Performed at [facility] [address] [place], [place] [address]  CBC     Status: Abnormal   Collection Time: 09/03/20  2:42 PM  Result Value Ref Range   WBC 8.5 4.0 - 10.5 K/uL   RBC 3.93 3.87  - 5.11 MIL/uL   Hemoglobin 11.6 (L) 12.0 - 15.0 g/dL   HCT 36.4 36.0 - 46.0 %   MCV 92.6 80.0 - 100.0 fL   MCH 29.5 26.0 - 34.0 pg   MCHC 31.9 30.0 - 36.0 g/dL   RDW 12.7 11.5 - 15.5 %   Platelets 348 150 - 400 K/uL   nRBC 0.0 0.0 - 0.2 %    Comment: Performed at [facility], [place] [address] [place], [place] [address]  Troponin I (High Sensitivity)     Status: None   Collection Time: 09/03/20  2:42 PM  Result Value Ref Range   Troponin I (High Sensitivity) 11 <18 ng/L    Comment: (NOTE) Elevated high sensitivity troponin I (hsTnI) values and significant  changes across serial measurements may suggest ACS but many other  chronic and acute conditions are known to elevate hsTnI results.  Refer to the "Links" section for chest pain algorithms and additional  guidance. Performed at [facility], [place] [address] [place], [place] [address]   CK     Status: None   Collection Time: 09/03/20  2:42 PM  Result Value Ref Range   Total CK 81 38 - 234 U/L    Comment: Performed at [facility], [place] [address] [place], [place] [address]  Troponin I (High Sensitivity)     Status: None   Collection Time: 09/03/20  4:42 PM  Result Value Ref Range   Troponin I (High Sensitivity) 12 <18 ng/L    Comment: (NOTE) Elevated high sensitivity troponin I (hsTnI) values and significant  changes across serial measurements may suggest ACS but many other  chronic and  acute conditions are known to elevate hsTnI results.  Refer to the "Links" section for chest pain algorithms and additional  guidance. Performed at [facility], [place] [address] [place], [place] [address]   Resp Panel by RT-PCR (Flu A&B, Covid) Nasopharyngeal Swab     Status: None   Collection Time: 09/03/20  8:11 PM   Specimen: Nasopharyngeal Swab; Nasopharyngeal(NP) swabs in vial transport medium  Result Value Ref Range   SARS Coronavirus 2 by RT PCR NEGATIVE NEGATIVE    Comment: (NOTE) SARS-CoV-2 target nucleic  acids are NOT DETECTED.  The SARS-CoV-2 RNA is generally detectable in upper respiratory specimens during the acute phase of infection. The lowest concentration of SARS-CoV-2 viral copies this assay can detect is 138 copies/mL. A negative result does not preclude SARS-Cov-2 infection and should not be used as the sole basis for treatment or other patient management decisions. A negative result may occur with  improper specimen collection/handling, submission of specimen other than nasopharyngeal swab, presence of viral mutation(s) within the areas targeted by this assay, and inadequate number of viral copies(<138 copies/mL). A negative result must be combined with clinical observations, patient history, and epidemiological information. The expected result is Negative.  Fact Sheet for Patients:  EntrepreneurPulse.com.au  Fact Sheet for Healthcare Providers:  IncredibleEmployment.be  This test is no t yet approved or cleared by the [place] FDA and  has been authorized for detection and/or diagnosis of SARS-CoV-2 by FDA under an Emergency Use Authorization (EUA). This EUA will remain  in effect (meaning this test can be used) for the duration of the COVID-19 declaration under Section 564(b)(1) of the Act, 21 U.S.C.section 360bbb-3(b)(1), unless the authorization is terminated  or revoked sooner.       Influenza A by PCR NEGATIVE NEGATIVE   Influenza B by PCR NEGATIVE NEGATIVE    Comment: (NOTE) The Xpert Xpress SARS-CoV-2/FLU/RSV plus assay is intended as an aid in the diagnosis of influenza from Nasopharyngeal swab specimens and should not be used as a sole basis for treatment. Nasal washings and aspirates are unacceptable for Xpert Xpress SARS-CoV-2/FLU/RSV testing.  Fact Sheet for Patients: EntrepreneurPulse.com.au  Fact Sheet for Healthcare Providers: IncredibleEmployment.be  This test is not  yet approved or cleared by the [place] FDA and has been authorized for detection and/or diagnosis of SARS-CoV-2 by FDA under an Emergency Use Authorization (EUA). This EUA will remain in effect (meaning this test can be used) for the duration of the COVID-19 declaration under Section 564(b)(1) of the Act, 21 U.S.C. section 360bbb-3(b)(1), unless the authorization is terminated or revoked.  Performed at [facility], [place] [address], [place], [place] [address]   Urine rapid drug screen (hosp performed)     Status: None   Collection Time: 09/03/20 10:37 PM  Result Value Ref Range   Opiates NONE DETECTED NONE DETECTED   Cocaine NONE DETECTED NONE DETECTED   Benzodiazepines NONE DETECTED NONE DETECTED   Amphetamines NONE DETECTED NONE DETECTED   Tetrahydrocannabinol NONE DETECTED NONE DETECTED   Barbiturates NONE DETECTED NONE DETECTED    Comment: (NOTE) DRUG SCREEN FOR MEDICAL PURPOSES ONLY.  IF CONFIRMATION IS NEEDED FOR ANY PURPOSE, NOTIFY LAB WITHIN 5 DAYS.  LOWEST DETECTABLE LIMITS FOR URINE DRUG SCREEN Drug Class                     Cutoff (ng/mL) Amphetamine and metabolites    1000 Barbiturate and metabolites    200 Benzodiazepine  [id number] Tricyclics and metabolites     300 Opiates and metabolites        300 Cocaine and metabolites        300 THC                            50 Performed at [facility], [place] [address] [place], [place] [address]     Medications:  Current Facility-Administered Medications  Medication Dose Route Frequency Provider Last Rate Last Admin   albuterol (VENTOLIN HFA) 108 (90 Base) MCG/ACT inhaler 2 puff  2 puff Inhalation Q6H PRN Wynona Dove A, DO       atorvastatin (LIPITOR) tablet 20 mg  20 mg Oral Daily Wynona Dove A, DO   20 mg at 09/04/20 0932   clonazePAM (KLONOPIN) tablet 1 mg  1 mg Oral BID Wynona Dove A, DO   1 mg at 09/04/20 0932   levothyroxine (SYNTHROID) tablet 88 mcg  88 mcg Oral Q0600 Jeanell Sparrow, DO   88 mcg at 09/04/20 [id number]   Current Outpatient Medications  Medication Sig Dispense Refill   albuterol (PROVENTIL HFA;VENTOLIN HFA) 108 (90 BASE) MCG/ACT inhaler Inhale 2 puffs into the lungs every 6 (six) hours as needed for wheezing or shortness of breath. 1 Inhaler 6   atorvastatin (LIPITOR) 20 MG tablet Take 1 tablet by mouth daily.     calcium carbonate (OS-CAL - DOSED IN MG OF ELEMENTAL CALCIUM) 1250 (500 CA) MG tablet Take by mouth.     citalopram (CELEXA) 20 MG tablet Take by mouth.     clonazePAM (KLONOPIN) 1 MG tablet Take 1 mg by mouth 2 (two) times daily.     docusate sodium (COLACE) 100 MG capsule Take 1 capsule (100 mg total) by mouth 2 (two) times daily. 10 capsule 0   Ferrous Fumarate (HEMOCYTE - 106 MG FE) 324 (106 Fe) MG TABS tablet Take by mouth. Reported on 04/13/2015     HYDROcodone-acetaminophen (NORCO/VICODIN) 5-325 MG tablet Take 1 tablet by mouth every 6 (six) hours as needed for moderate pain. 15 tablet 0   levothyroxine (SYNTHROID, LEVOTHROID) 88 MCG tablet Take by mouth.     polyethylene glycol (MIRALAX / GLYCOLAX) packet Take by mouth.     Probiotic Product (PROBIOTIC & ACIDOPHILUS EX ST PO) Take by mouth.     Vitamin D, Ergocalciferol, (DRISDOL) 50000 UNITS CAPS capsule Take by mouth.      Musculoskeletal: Strength & Muscle Tone:  Unable to assess via tele health [place]:  Did not see patient ambulate Patient leans: N/A   Psychiatric Specialty Exam:  Presentation  General Appearance: Appropriate for Environment  Eye Contact:Good  Speech:Clear and Coherent; Normal Rate  Speech Volume:Normal  Handedness: No data recorded  Mood and Affect  Mood:Euthymic  Affect:Appropriate; Congruent   Thought Process  Thought Processes:Coherent; Goal Directed  Descriptions of Associations:Intact  Orientation:Full (Time, Place and Person)  Thought Content:WDL; Logical  History of Schizophrenia/Schizoaffective disorder:No  Duration of  Psychotic Symptoms:Greater than six months  Hallucinations:Hallucinations: None  Ideas of Reference:None  Suicidal Thoughts:Suicidal Thoughts: No  Homicidal Thoughts:Homicidal Thoughts: No   Sensorium  Memory:Immediate Good; Recent Good  Judgment:Intact  Insight:Present   Executive Functions  Concentration:Good  Attention Span:Good  Maries of Knowledge:Good  Language:Good   Psychomotor Activity  Psychomotor Activity:Psychomotor Activity: Normal   Assets  Assets:Communication Skills; Desire for Improvement; Financial Resources/Insurance; Housing; Leisure Time; Resilience; Social Support  Sleep  Sleep:Sleep: Good    Physical Exam: Physical Exam Vitals and nursing note reviewed. Exam conducted with a chaperone present.  Constitutional:      General: She is not in acute distress.    Appearance: Normal appearance. She is not ill-appearing.  Cardiovascular:     Rate and Rhythm: Normal rate.  Pulmonary:     Effort: Pulmonary effort is normal.  Neurological:     Mental Status: She is alert and oriented to person, place, and time.  Psychiatric:        Attention and Perception: Attention and perception normal. She does not perceive auditory or visual hallucinations.        Mood and Affect: Mood and affect normal.        Speech: Speech normal.        Behavior: Behavior normal. Behavior is cooperative.        Thought Content: Thought content normal. Thought content is not paranoid or delusional. Thought content does not include homicidal or suicidal ideation.        Cognition and Memory: Cognition and memory normal.        Judgment: Judgment normal.   Review of Systems  Constitutional: Negative.   HENT: Negative.    Eyes: Negative.   Respiratory: Negative.    Cardiovascular: Negative.   Gastrointestinal: Negative.   Genitourinary: Negative.   Musculoskeletal: Negative.   Skin: Negative.   Neurological: Negative.   Endo/Heme/Allergies:  Negative.   Psychiatric/Behavioral:  Negative for hallucinations (Denies). Depression: Stable. Substance abuse: Denies. Suicidal ideas: Denies.Nervous/anxious: Stable. Insomnia: Denies.        Patient stating that she needs something to do during the day.  Also states that some nights she doesn't sleep well but dozes off/on during the day.    Blood pressure 133/72, pulse 85, temperature 98.7 F (37.1 C), temperature source Oral, resp. rate 16, height '5\' 1"'$  (1.549 m), weight 64 kg, SpO2 100 %. Body mass index is 26.66 kg/m.  Treatment Plan Summary: Plan Psychiatrically clear.  Patient to follow up with resources for outpatient psychiatric services (medication management).  Will order social work consult to assist patient and answer question related to home health, nutrition referral and other needs patient had questions about.    Social work/TOC consult ordered:  Patient wanted to speak to someone related to questions on how to get nursing in home, how to get help with medication management, and referral to nutritionist related to problems with her stomach because of medical issues.  Patient also wants resources for day programs to have activities during the day.  Patient will also need outpatient psychiatric services for medication management and therapy  Disposition:  Psychiatrically clear No evidence of imminent risk to self or others at present.   Patient does not meet criteria for psychiatric inpatient admission. Supportive therapy provided about ongoing stressors. Refer to IOP. Discussed crisis plan, support from social network, calling 911, coming to the Emergency Department, and calling Suicide Hotline.  This service was provided via telemedicine using a 2-way, interactive audio and video technology.  Names of all persons participating in this telemedicine service and their role in this encounter. Name: Elizabeth [name] Role: NP  Name: Dr. Hampton Abbot Role: Psychiatrist  Name: Elizabeth [name] Role: Patient  Name: Alison Murray Role: Patient's nurse sent a secure message informing:  Psychiatric consult complete.  Patient psychiatrically cleared.  Social work consult ordered to assist patient with information and referrals that patient had concerns about:  how to  get nursing in home, day programs, help with medication management, and referral to nutritionist related to problems with her stomach because of medical issues.  Patient will also need outpatient psychiatric services for medication management and therapy    Elizabeth Battiste, NP 09/04/2020 2:35 PM

## 2020-12-15 DIAGNOSIS — R768 Other specified abnormal immunological findings in serum: Secondary | ICD-10-CM | POA: Insufficient documentation

## 2021-01-09 ENCOUNTER — Inpatient Hospital Stay
Admission: RE | Admit: 2021-01-09 | Discharge: 2021-01-09 | Disposition: A | Discharge status: Home / Self Care | Payer: Self-pay | Source: Ambulatory Visit | Attending: *Deleted | Admitting: *Deleted

## 2021-01-09 ENCOUNTER — Other Ambulatory Visit: Payer: Self-pay

## 2021-01-09 ENCOUNTER — Other Ambulatory Visit: Payer: Self-pay | Admitting: *Deleted

## 2021-01-09 ENCOUNTER — Ambulatory Visit
Admission: RE | Admit: 2021-01-09 | Discharge: 2021-01-09 | Disposition: A | Discharge status: Home / Self Care | Payer: Medicare HMO | Source: Ambulatory Visit | Attending: Family Medicine | Admitting: Family Medicine

## 2021-01-09 DIAGNOSIS — N63 Unspecified lump in unspecified breast: Secondary | ICD-10-CM

## 2021-01-09 DIAGNOSIS — Z1231 Encounter for screening mammogram for malignant neoplasm of breast: Secondary | ICD-10-CM

## 2021-01-09 DIAGNOSIS — N6331 Unspecified lump in axillary tail of the right breast: Secondary | ICD-10-CM | POA: Diagnosis not present

## 2021-01-09 DIAGNOSIS — N6314 Unspecified lump in the right breast, lower inner quadrant: Secondary | ICD-10-CM | POA: Insufficient documentation

## 2021-01-11 ENCOUNTER — Other Ambulatory Visit: Payer: Self-pay | Admitting: Family Medicine

## 2021-01-11 DIAGNOSIS — N6310 Unspecified lump in the right breast, unspecified quadrant: Secondary | ICD-10-CM

## 2021-01-11 DIAGNOSIS — R928 Other abnormal and inconclusive findings on diagnostic imaging of breast: Secondary | ICD-10-CM

## 2021-01-19 ENCOUNTER — Ambulatory Visit
Admission: RE | Admit: 2021-01-19 | Discharge: 2021-01-19 | Disposition: A | Discharge status: Home / Self Care | Payer: Medicare HMO | Source: Ambulatory Visit | Attending: Family Medicine | Admitting: Family Medicine

## 2021-01-19 ENCOUNTER — Other Ambulatory Visit: Payer: Self-pay

## 2021-01-19 DIAGNOSIS — C50811 Malignant neoplasm of overlapping sites of right female breast: Secondary | ICD-10-CM | POA: Diagnosis not present

## 2021-01-19 DIAGNOSIS — N6310 Unspecified lump in the right breast, unspecified quadrant: Secondary | ICD-10-CM

## 2021-01-19 DIAGNOSIS — R928 Other abnormal and inconclusive findings on diagnostic imaging of breast: Secondary | ICD-10-CM

## 2021-01-19 DIAGNOSIS — C773 Secondary and unspecified malignant neoplasm of axilla and upper limb lymph nodes: Secondary | ICD-10-CM | POA: Diagnosis not present

## 2021-01-19 HISTORY — PX: BREAST BIOPSY: SHX20

## 2021-01-23 ENCOUNTER — Ambulatory Visit: Admission: RE | Admit: 2021-01-23 | Payer: Medicare HMO | Source: Ambulatory Visit

## 2021-01-23 ENCOUNTER — Inpatient Hospital Stay: Admission: RE | Admit: 2021-01-23 | Payer: Medicare HMO | Source: Ambulatory Visit

## 2021-01-24 ENCOUNTER — Encounter: Payer: Self-pay | Admitting: *Deleted

## 2021-01-24 DIAGNOSIS — C50911 Malignant neoplasm of unspecified site of right female breast: Secondary | ICD-10-CM

## 2021-01-24 NOTE — Progress Notes (Signed)
Received message from Electa Sniff, RN that patient had been notified of her new diagnosis of invasive breast cancer and was ready for navigation.  Called patient.  She was upset and very unclear on what to do.  Asked me to call her son and daughter.  I spoke with both of her children.  Daughter states she has paranoid schizophrenia and she is bipolar.  I have scheduled patient to see Dr. Lysle Pearl  and Dr. Grayland Ormond on 01/31/21 so the family can be available to attend both consultations.  Will give educational material at her consultation.

## 2021-01-25 ENCOUNTER — Ambulatory Visit: Payer: Medicare HMO

## 2021-01-26 ENCOUNTER — Other Ambulatory Visit: Payer: Self-pay | Admitting: *Deleted

## 2021-01-26 ENCOUNTER — Encounter: Payer: Self-pay | Admitting: *Deleted

## 2021-01-28 DIAGNOSIS — C50911 Malignant neoplasm of unspecified site of right female breast: Secondary | ICD-10-CM | POA: Insufficient documentation

## 2021-01-28 NOTE — Progress Notes (Signed)
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  INTERVAL HISTORY: Patient is a 71 year old female who recently underwent mammogram, ultrasound, biopsy revealing the above-stated malignancy.  Patient reports that she has had breast lumps for several years now and has not had a mammogram since 2014.  She is highly anxious and accompanied by her son.  She has no neurologic complaints.  She denies any recent fevers or illnesses.  She has a good appetite and denies weight loss.  She has no chest pain, shortness of breath, cough, or hemoptysis.  She denies any nausea, vomiting, constipation, or diarrhea.  She has no urinary complaints.  Patient otherwise feels well and offers no further specific complaints today.  REVIEW OF SYSTEMS:   Review of Systems  Constitutional: Negative.  Negative for fever, malaise/fatigue and weight loss.  Respiratory: Negative.  Negative for cough, hemoptysis and shortness of breath.   Cardiovascular: Negative.  Negative for chest pain and leg swelling.  Gastrointestinal: Negative.  Negative for abdominal pain.  Genitourinary: Negative.  Negative for dysuria.  Musculoskeletal: Negative.  Negative for back pain.  Skin: Negative.  Negative for rash.  Neurological: Negative.  Negative for dizziness, focal weakness, weakness and headaches.  Psychiatric/Behavioral:  The patient is nervous/anxious.    As per HPI. Otherwise, a complete review of systems is negative.  PAST MEDICAL HISTORY: Past Medical History:  Diagnosis Date   Acute cholecystitis 2016   Anemia    Anemia 2016    Anxiety    panic attacks   Anxiety and depression    Bipolar 1 disorder (HCC)    Bowel obstruction ([place]) 2017   Common bile duct dilatation 2015   Dyspnea    Edema    Fibromyalgia    GI bleed 2017   History of kidney stones 01/21/2009   HSV-1 infection    Hyperglycemia    Hyperlipidemia    Hypokalemia    Hypothyroidism    Liver cyst    Malignant neoplasm of right female breast, unspecified estrogen receptor status, unspecified site of breast ([place])    Memory loss    Osteopenia    Panic disorder    Schizophrenia ([place])    Ventral hernia     PAST SURGICAL HISTORY: Past Surgical History:  Procedure Laterality Date   ABDOMINAL HYSTERECTOMY     BREAST BIOPSY Right 01/19/2021   [place] Bx, ribbon, path pending   BREAST BIOPSY Right 01/19/2021   [place] Bx, Venus, path pending   BREAST BIOPSY Right 01/19/2021   [place] Bx, heart, path pending   BREAST BIOPSY Right 01/19/2021   [place] BX Axilla, Coil, path pending   CESAREAN SECTION     CHOLECYSTECTOMY OPEN  11/17/2013   with repair of bile duct-Dr. Marina Gravel   OPEN REDUCTION INTERNAL FIXATION (ORIF) DISTAL RADIAL FRACTURE Right 12/20/2014   Procedure: OPEN REDUCTION INTERNAL FIXATION (ORIF) RIGHT DISTAL RADIAL FRACTURE;  Surgeon: Renette Butters, MD;  Location: [place];  Service: Orthopedics;  Laterality: Right;   STOMACH SURGERY      FAMILY HISTORY: Family History  Problem Relation Age of Onset   Heart disease Father    Breast cancer Paternal Grandmother  Asthma Daughter    Breast cancer Paternal Aunt    Kidney disease Paternal Aunt    Bladder Cancer Neg Hx     ADVANCED DIRECTIVES (Y/N):  N  HEALTH MAINTENANCE: Social History   Tobacco Use   Smoking status: Former    Packs/day: 0.00    Years: 0.00    Pack years: 0.00    Types: Cigarettes    Quit date: 11/03/1973    Years since quitting: 47.2    Passive exposure: Past   Smokeless tobacco: Never   Tobacco comments:    Smoked briefly as a teen- lives in  secondhand smoke.   Vaping Use   Vaping Use: Never used  Substance Use Topics   Alcohol use: No    Alcohol/week: 0.0 standard drinks   Drug use: No     Colonoscopy:  PAP:  Bone density:  Lipid panel:  Allergies  Allergen Reactions   Oxycodone-Acetaminophen Hives    "has been taking without any problem"   Penicillins Hives    Hives Hives     Current Outpatient Medications  Medication Sig Dispense Refill   atorvastatin (LIPITOR) 20 MG tablet Take 1 tablet by mouth daily.     levothyroxine (SYNTHROID, LEVOTHROID) 88 MCG tablet Take by mouth.     albuterol (PROVENTIL HFA;VENTOLIN HFA) 108 (90 BASE) MCG/ACT inhaler Inhale 2 puffs into the lungs every 6 (six) hours as needed for wheezing or shortness of breath. (Patient not taking: Reported on 09/04/2020) 1 Inhaler 6   alendronate (FOSAMAX) 70 MG tablet Take 1 tablet by mouth once a week. (Patient not taking: Reported on 01/31/2021)     citalopram (CELEXA) 20 MG tablet Take by mouth. (Patient not taking: Reported on 09/04/2020)     clonazePAM (KLONOPIN) 1 MG tablet Take 1 mg by mouth 2 (two) times daily. (Patient not taking: Reported on 01/31/2021)     docusate sodium (COLACE) 100 MG capsule Take 1 capsule (100 mg total) by mouth 2 (two) times daily. (Patient not taking: Reported on 09/04/2020) 10 capsule 0   ferrous sulfate 325 (65 FE) MG tablet Take 1 tablet by mouth daily with breakfast. (Patient not taking: Reported on 01/31/2021)     mirtazapine (REMERON) 15 MG tablet TAKE 1/2 (ONE-HALF) TABLET BY MOUTH ONCE DAILY FOR 2 WEEKS INCREASE TO 1 TABLET AT BEDTIME AS DIRECTED (Patient not taking: Reported on 01/31/2021)     PARoxetine (PAXIL) 10 MG tablet Take 1 tablet by mouth daily. (Patient not taking: Reported on 01/31/2021)     No current facility-administered medications for this visit.    OBJECTIVE: Vitals:   01/31/21 1018  BP: 131/71  Pulse: 83  Resp: 16  Temp: (!) 97 F (36.1 C)  SpO2: 97%     Body mass index is 21.73 kg/m.     ECOG FS:0 - Asymptomatic  General: Well-developed, well-nourished, no acute distress. Eyes: Pink conjunctiva, anicteric sclera. HEENT: Normocephalic, moist mucous membranes. Breast: Patient declined exam today. Lungs: No audible wheezing or coughing. Heart: Regular rate and rhythm. Abdomen: Soft, nontender, no obvious distention. Musculoskeletal: No edema, cyanosis, or clubbing. Neuro: Alert, answering all questions appropriately. Cranial nerves grossly intact. Skin: No rashes or petechiae noted. Psych: Normal affect. Lymphatics: No cervical, calvicular, axillary or inguinal LAD.   LAB RESULTS:  Lab Results  Component Value Date   NA 140 09/03/2020   K 4.0 09/03/2020   CL 106 09/03/2020   CO2 27 09/03/2020   GLUCOSE 96 09/03/2020   BUN 10 09/03/2020  CREATININE 0.90 09/03/2020   CALCIUM 9.1 09/03/2020   PROT 7.9 12/18/2015   ALBUMIN 2.9 (L) 12/18/2015   AST 35 12/18/2015   ALT 68 (H) 12/18/2015   ALKPHOS 188 (H) 12/18/2015   BILITOT 1.4 (H) 12/18/2015   GFRNONAA >60 09/03/2020   GFRAA 28 (L) 12/18/2015    Lab Results  Component Value Date   WBC 8.5 09/03/2020   NEUTROABS 8.1 (H) 11/20/2013   HGB 11.6 (L) 09/03/2020   HCT 36.4 09/03/2020   MCV 92.6 09/03/2020   PLT 348 09/03/2020     STUDIES: US BREAST LTD UNI RIGHT INC AXILLA  Result Date: 01/09/2021 CLINICAL DATA:  Patient presents for a bilateral diagnostic exam due to a palpable abnormality at the skin over the inner right breast as well as second palpable abnormality over the medial right breast. Patient has been lost to follow-up since 2014 as patient had not returned for follow-up of right breast microcalcifications and asymmetry. EXAM: DIGITAL DIAGNOSTIC BILATERAL MAMMOGRAM WITH TOMOSYNTHESIS AND CAD; ULTRASOUND RIGHT BREAST LIMITED TECHNIQUE: Bilateral digital diagnostic mammography and breast tomosynthesis was performed. The images were evaluated with computer-aided detection.; Targeted ultrasound  examination of the right breast was performed COMPARISON:  Previous exam(s). ACR Breast Density Category c: The breast tissue is heterogeneously dense, which may obscure small masses. FINDINGS: Examination demonstrates an irregular mass which appears be abutting the skin over the middle third of the inner midportion of the right breast accounting for 1 of patient's palpable abnormalities. There is also an irregular mass over the inner lower left breast accounting for patient's second palpable abnormality. There is a spiculated mass in the right retroareolar region just medial to the midline. Possible small additional irregular mass over the middle to anterior third of the medial right breast. Segmental distribution of coarse microcalcifications spanning 5 cm are not significantly changed from 2014 over the outer lower right breast. Left breast is unchanged. On physical exam, there is a 1 cm violaceous colored raised skin lesion over 1 of patient's palpable abnormalities at approximately the 4 o'clock position of the right breast. Targeted ultrasound is performed, showing over the right breast: -irregular hypoechoic mass extending to and involving the skin at the 4 o'clock position 6 cm from the nipple measuring 1 x 1.1 x 1.4 cm. -irregular hypoechoic mass at the 5:30 position 7 cm from the nipple measuring 0.8 x 0.9 x 1.1 cm. -irregular hypoechoic mass extending to the skin at the 3 o'clock position 1 cm from the nipple measuring 1.3 x 1.4 x 2 cm. -circumscribed lobulated cyst cluster at the 4 o'clock position 4 cm from the nipple measuring 4 x 4 x 5 mm. Ultrasound of the right axilla demonstrates approximately 4-5 abnormal lymph nodes with cortical thickening. The largest lymph node with cortical thickening demonstrates suggestion of calcifications within the cortex. IMPRESSION: Three suspicious masses as described over the inner lower quadrant of the right breast as described in addition to abnormal right axillary  lymph nodes. RECOMMENDATION: Recommend ultrasound-guided core needle biopsy of the 3 suspicious masses described at the 4 o'clock position, 5:30 position and 3 o'clock position as well as biopsy of 1 of the abnormal right axillary lymph nodes. I have discussed the findings and recommendations with the patient and her son who was present. If applicable, a reminder letter will be sent to the patient regarding the next appointment. BI-RADS CATEGORY  5: Highly suggestive of malignancy. Biopsy was scheduled here at the [facility]. Electronically Signed   By:  Marin Olp M.D.   On: 01/09/2021 12:06  MM DIAG BREAST TOMO BILATERAL  Result Date: 01/09/2021 CLINICAL DATA:  Patient presents for a bilateral diagnostic exam due to a palpable abnormality at the skin over the inner right breast as well as second palpable abnormality over the medial right breast. Patient has been lost to follow-up since 2014 as patient had not returned for follow-up of right breast microcalcifications and asymmetry. EXAM: DIGITAL DIAGNOSTIC BILATERAL MAMMOGRAM WITH TOMOSYNTHESIS AND CAD; ULTRASOUND RIGHT BREAST LIMITED TECHNIQUE: Bilateral digital diagnostic mammography and breast tomosynthesis was performed. The images were evaluated with computer-aided detection.; Targeted ultrasound examination of the right breast was performed COMPARISON:  Previous exam(s). ACR Breast Density Category c: The breast tissue is heterogeneously dense, which may obscure small masses. FINDINGS: Examination demonstrates an irregular mass which appears be abutting the skin over the middle third of the inner midportion of the right breast accounting for 1 of patient's palpable abnormalities. There is also an irregular mass over the inner lower left breast accounting for patient's second palpable abnormality. There is a spiculated mass in the right retroareolar region just medial to the midline. Possible small additional irregular mass over the middle  to anterior third of the medial right breast. Segmental distribution of coarse microcalcifications spanning 5 cm are not significantly changed from 2014 over the outer lower right breast. Left breast is unchanged. On physical exam, there is a 1 cm violaceous colored raised skin lesion over 1 of patient's palpable abnormalities at approximately the 4 o'clock position of the right breast. Targeted ultrasound is performed, showing over the right breast: -irregular hypoechoic mass extending to and involving the skin at the 4 o'clock position 6 cm from the nipple measuring 1 x 1.1 x 1.4 cm. -irregular hypoechoic mass at the 5:30 position 7 cm from the nipple measuring 0.8 x 0.9 x 1.1 cm. -irregular hypoechoic mass extending to the skin at the 3 o'clock position 1 cm from the nipple measuring 1.3 x 1.4 x 2 cm. -circumscribed lobulated cyst cluster at the 4 o'clock position 4 cm from the nipple measuring 4 x 4 x 5 mm. Ultrasound of the right axilla demonstrates approximately 4-5 abnormal lymph nodes with cortical thickening. The largest lymph node with cortical thickening demonstrates suggestion of calcifications within the cortex. IMPRESSION: Three suspicious masses as described over the inner lower quadrant of the right breast as described in addition to abnormal right axillary lymph nodes. RECOMMENDATION: Recommend ultrasound-guided core needle biopsy of the 3 suspicious masses described at the 4 o'clock position, 5:30 position and 3 o'clock position as well as biopsy of 1 of the abnormal right axillary lymph nodes. I have discussed the findings and recommendations with the patient and her son who was present. If applicable, a reminder letter will be sent to the patient regarding the next appointment. BI-RADS CATEGORY  5: Highly suggestive of malignancy. Biopsy was scheduled here at the [facility]. Electronically Signed   By: Marin Olp M.D.   On: 01/09/2021 12:06  [place] AXILLARY NODE CORE BIOPSY  RIGHT  Addendum Date: 01/25/2021   ADDENDUM REPORT: 01/25/2021 08:50 ADDENDUM: PATHOLOGY revealed: Site A. BREAST, RIGHT, 3 O'CLOCK (RIBBON CLIP); ULTRASOUND-GUIDED CORE BIOPSY: - INVASIVE MAMMARY CARCINOMA, NO SPECIAL TYPE. Size of invasive carcinoma: 11 mm in this sample. Grade 2. Ductal carcinoma in situ: Not identified. Lymphovascular invasion: Not identified. Pathology results are CONCORDANT with imaging findings, per Dr. Lovey Newcomer. PATHOLOGY revealed: Site B. BREAST, RIGHT, 4 O'CLOCK (VENUS CLIP); ULTRASOUND-GUIDED CORE  BIOPSY: - INVASIVE MAMMARY CARCINOMA, NO SPECIAL TYPE. Size of invasive carcinoma: 10 mm in this sample. Grade 2. Ductal carcinoma in situ: Not identified. Lymphovascular invasion: Not identified. Pathology results are CONCORDANT with imaging findings, per Dr. Lovey Newcomer. PATHOLOGY revealed: Site C. BREAST, RIGHT, 5:30 O'CLOCK (HEART CLIP); ULTRASOUND-GUIDED CORE BIOPSY: - INVASIVE MAMMARY CARCINOMA, NO SPECIAL TYPE. Size of invasive carcinoma: 9 mm in this sample. Grade 1. Ductal carcinoma in situ: Not identified. Lymphovascular invasion: Not identified. Pathology results are CONCORDANT with imaging findings, per Dr. Lovey Newcomer. PATHOLOGY revealed: Site D. LYMPH NODE, RIGHT AXILLA (HYDROMARK CLIP); ULTRASOUND-GUIDED CORE BIOPSY: - METASTATIC MAMMARY CARCINOMA, 6 MM IN SIZE IN THIS SAMPLE. - EXTRANODAL EXTENSION IS PRESENT, LESS THAN 2 MM IN THIS SAMPLE. Pathology results are CONCORDANT with imaging findings, per Dr. Lovey Newcomer. Comment: The three sampled right breast carcinomas are of no special type (ductal). The two largest tumors clinically (A and B) are similar in histologic appearance and are grade 2. The axillary node metastasis is similar to the tumors in A and B. The smallest tumor (C) is grade 1, histologically lower grade. The College of American Pathologists breast biomarker checklist (06/2020) recommends that the largest invasive focus be tested for ER, PR, and HER2 when multiple  invasive foci are present. Testing of a smaller carcinoma is recommended if it is a different type or higher grade, which is not applicable in this case. ER, PR, and HER2 testing is performed at this time on the largest carcinoma (A, 3 o'clock). Per patient request, pathology results and recommendations below were discussed with patient, and son Elizabeth [name]) by telephone on 01/24/2021. Patient reported biopsy site within normal limits with slight tenderness at the site. Post biopsy care instructions were reviewed, questions were answered and my direct phone number was provided to patient. Patient was instructed to call [facility] if any concerns or questions arise related to the biopsy. RECOMMENDATIONS: Surgical and oncological consultation. Request for surgical and oncological consultation relayed to Al Pimple RN and Tanya Nones RN at [facility] by Electa Sniff RN on 01/24/2021. Pathology results reported by Electa Sniff RN on 01/24/2021. Electronically Signed   By: Lovey Newcomer M.D.   On: 01/25/2021 08:50   Result Date: 01/25/2021 CLINICAL DATA:  Patient with 3 suspicious right breast masses and enlarged right axillary lymph nodes, for sampling of all 4 lesions. EXAM: ULTRASOUND GUIDED RIGHT BREAST CORE NEEDLE BIOPSY COMPARISON:  Previous exam(s). PROCEDURE: I met with the patient and we discussed the procedure of ultrasound-guided biopsy, including benefits and alternatives. We discussed the high likelihood of a successful procedure. We discussed the risks of the procedure, including infection, bleeding, tissue injury, clip migration, and inadequate sampling. Informed written consent was given. The usual time-out protocol was performed immediately prior to the procedure. Site 1: Right breast mass 3 o'clock position: Lesion quadrant: Upper inner quadrant Using sterile technique and 1% Lidocaine as local anesthetic, under direct ultrasound visualization, a 14 gauge spring-loaded  device was used to perform biopsy of right breast mass 3 o'clock position using a medial approach. At the conclusion of the procedure ribbon shaped tissue marker clip was deployed into the biopsy cavity. Follow up 2 view mammogram was performed and dictated separately. Site 2: Right breast mass 4 o'clock position Lesion quadrant: Lower inner quadrant Using sterile technique and 1% Lidocaine as local anesthetic, under direct ultrasound visualization, a 14 gauge spring-loaded device was used to perform biopsy of right breast mass 4  o'clock position using a medial approach. At the conclusion of the procedure venous tissue marker clip was deployed into the biopsy cavity. Follow up 2 view mammogram was performed and dictated separately. Site 3: Right breast mass 5:30 o'clock position Lesion quadrant: Lower inner quadrant Using sterile technique and 1% Lidocaine as local anesthetic, under direct ultrasound visualization, a 14 gauge spring-loaded device was used to perform biopsy of right breast mass 5:30 o'clock using a medial approach. At the conclusion of the procedure heart tissue marker clip was deployed into the biopsy cavity. Follow up 2 view mammogram was performed and dictated separately. Site 4: Right axillary node Lesion quadrant: Upper outer quadrant Using sterile technique and 1% Lidocaine as local anesthetic, under direct ultrasound visualization, a 14 gauge spring-loaded device was used to perform biopsy of right axillary node using a lateral approach. At the conclusion of the procedure [facility] tissue marker clip was deployed into the biopsy cavity. Follow up 2 view mammogram was performed and dictated separately. IMPRESSION: Ultrasound guided biopsy of 3 right breast masses and 1 right axillary lymph node. No apparent complications. Electronically Signed: By: Lovey Newcomer M.D. On: 01/19/2021 09:27   MM CLIP PLACEMENT RIGHT  Result Date: 01/19/2021 CLINICAL DATA:  Patient status post ultrasound-guided  core needle biopsy 3 right breast masses and 1 right axillary lymph node. EXAM: 3D DIAGNOSTIC RIGHT MAMMOGRAM POST ULTRASOUND BIOPSY COMPARISON:  Previous exam(s). FINDINGS: 3D Mammographic images were obtained ultrasound-guided core needle biopsy of right breast masses and right axillary lymph node. Site 1: Right breast mass 3 o'clock position: Ribbon shaped clip: In appropriate position. Site 2: Right breast mass 4 o'clock position: Venous shaped clip: In appropriate position. Site 3: Right breast mass 5:30 o'clock: Heart shaped clip: In appropriate position. Site 4: Right axilla: HydroMARK clip: In appropriate position. IMPRESSION: Appropriate positioning of the biopsy marking clips as above. Final Assessment: Post Procedure Mammograms for Marker Placement Electronically Signed   By: Lovey Newcomer M.D.   On: 01/19/2021 09:31  [place] RT BREAST BX W LOC DEV 1ST LESION IMG BX SPEC US GUIDE  Addendum Date: 01/25/2021   ADDENDUM REPORT: 01/25/2021 08:50 ADDENDUM: PATHOLOGY revealed: Site A. BREAST, RIGHT, 3 O'CLOCK (RIBBON CLIP); ULTRASOUND-GUIDED CORE BIOPSY: - INVASIVE MAMMARY CARCINOMA, NO SPECIAL TYPE. Size of invasive carcinoma: 11 mm in this sample. Grade 2. Ductal carcinoma in situ: Not identified. Lymphovascular invasion: Not identified. Pathology results are CONCORDANT with imaging findings, per Dr. Lovey Newcomer. PATHOLOGY revealed: Site B. BREAST, RIGHT, 4 O'CLOCK (VENUS CLIP); ULTRASOUND-GUIDED CORE BIOPSY: - INVASIVE MAMMARY CARCINOMA, NO SPECIAL TYPE. Size of invasive carcinoma: 10 mm in this sample. Grade 2. Ductal carcinoma in situ: Not identified. Lymphovascular invasion: Not identified. Pathology results are CONCORDANT with imaging findings, per Dr. Lovey Newcomer. PATHOLOGY revealed: Site C. BREAST, RIGHT, 5:30 O'CLOCK (HEART CLIP); ULTRASOUND-GUIDED CORE BIOPSY: - INVASIVE MAMMARY CARCINOMA, NO SPECIAL TYPE. Size of invasive carcinoma: 9 mm in this sample. Grade 1. Ductal carcinoma in situ: Not identified.  Lymphovascular invasion: Not identified. Pathology results are CONCORDANT with imaging findings, per Dr. Lovey Newcomer. PATHOLOGY revealed: Site D. LYMPH NODE, RIGHT AXILLA (HYDROMARK CLIP); ULTRASOUND-GUIDED CORE BIOPSY: - METASTATIC MAMMARY CARCINOMA, 6 MM IN SIZE IN THIS SAMPLE. - EXTRANODAL EXTENSION IS PRESENT, LESS THAN 2 MM IN THIS SAMPLE. Pathology results are CONCORDANT with imaging findings, per Dr. Lovey Newcomer. Comment: The three sampled right breast carcinomas are of no special type (ductal). The two largest tumors clinically (A and B) are similar in histologic appearance and  are grade 2. The axillary node metastasis is similar to the tumors in A and B. The smallest tumor (C) is grade 1, histologically lower grade. The College of American Pathologists breast biomarker checklist (06/2020) recommends that the largest invasive focus be tested for ER, PR, and HER2 when multiple invasive foci are present. Testing of a smaller carcinoma is recommended if it is a different type or higher grade, which is not applicable in this case. ER, PR, and HER2 testing is performed at this time on the largest carcinoma (A, 3 o'clock). Per patient request, pathology results and recommendations below were discussed with patient, and son Elizabeth [name]) by telephone on 01/24/2021. Patient reported biopsy site within normal limits with slight tenderness at the site. Post biopsy care instructions were reviewed, questions were answered and my direct phone number was provided to patient. Patient was instructed to call [facility] if any concerns or questions arise related to the biopsy. RECOMMENDATIONS: Surgical and oncological consultation. Request for surgical and oncological consultation relayed to Al Pimple RN and Tanya Nones RN at [facility] by Electa Sniff RN on 01/24/2021. Pathology results reported by Electa Sniff RN on 01/24/2021. Electronically Signed   By: Lovey Newcomer M.D.   On: 01/25/2021  08:50   Result Date: 01/25/2021 CLINICAL DATA:  Patient with 3 suspicious right breast masses and enlarged right axillary lymph nodes, for sampling of all 4 lesions. EXAM: ULTRASOUND GUIDED RIGHT BREAST CORE NEEDLE BIOPSY COMPARISON:  Previous exam(s). PROCEDURE: I met with the patient and we discussed the procedure of ultrasound-guided biopsy, including benefits and alternatives. We discussed the high likelihood of a successful procedure. We discussed the risks of the procedure, including infection, bleeding, tissue injury, clip migration, and inadequate sampling. Informed written consent was given. The usual time-out protocol was performed immediately prior to the procedure. Site 1: Right breast mass 3 o'clock position: Lesion quadrant: Upper inner quadrant Using sterile technique and 1% Lidocaine as local anesthetic, under direct ultrasound visualization, a 14 gauge spring-loaded device was used to perform biopsy of right breast mass 3 o'clock position using a medial approach. At the conclusion of the procedure ribbon shaped tissue marker clip was deployed into the biopsy cavity. Follow up 2 view mammogram was performed and dictated separately. Site 2: Right breast mass 4 o'clock position Lesion quadrant: Lower inner quadrant Using sterile technique and 1% Lidocaine as local anesthetic, under direct ultrasound visualization, a 14 gauge spring-loaded device was used to perform biopsy of right breast mass 4 o'clock position using a medial approach. At the conclusion of the procedure venous tissue marker clip was deployed into the biopsy cavity. Follow up 2 view mammogram was performed and dictated separately. Site 3: Right breast mass 5:30 o'clock position Lesion quadrant: Lower inner quadrant Using sterile technique and 1% Lidocaine as local anesthetic, under direct ultrasound visualization, a 14 gauge spring-loaded device was used to perform biopsy of right breast mass 5:30 o'clock using a medial approach. At  the conclusion of the procedure heart tissue marker clip was deployed into the biopsy cavity. Follow up 2 view mammogram was performed and dictated separately. Site 4: Right axillary node Lesion quadrant: Upper outer quadrant Using sterile technique and 1% Lidocaine as local anesthetic, under direct ultrasound visualization, a 14 gauge spring-loaded device was used to perform biopsy of right axillary node using a lateral approach. At the conclusion of the procedure [facility] tissue marker clip was deployed into the biopsy cavity. Follow up 2 view mammogram  was performed and dictated separately. IMPRESSION: Ultrasound guided biopsy of 3 right breast masses and 1 right axillary lymph node. No apparent complications. Electronically Signed: By: Lovey Newcomer M.D. On: 01/19/2021 09:27   [place] RT BREAST BX W LOC DEV EA ADD LESION IMG BX SPEC US GUIDE  Addendum Date: 01/25/2021   ADDENDUM REPORT: 01/25/2021 08:50 ADDENDUM: PATHOLOGY revealed: Site A. BREAST, RIGHT, 3 O'CLOCK (RIBBON CLIP); ULTRASOUND-GUIDED CORE BIOPSY: - INVASIVE MAMMARY CARCINOMA, NO SPECIAL TYPE. Size of invasive carcinoma: 11 mm in this sample. Grade 2. Ductal carcinoma in situ: Not identified. Lymphovascular invasion: Not identified. Pathology results are CONCORDANT with imaging findings, per Dr. Lovey Newcomer. PATHOLOGY revealed: Site B. BREAST, RIGHT, 4 O'CLOCK (VENUS CLIP); ULTRASOUND-GUIDED CORE BIOPSY: - INVASIVE MAMMARY CARCINOMA, NO SPECIAL TYPE. Size of invasive carcinoma: 10 mm in this sample. Grade 2. Ductal carcinoma in situ: Not identified. Lymphovascular invasion: Not identified. Pathology results are CONCORDANT with imaging findings, per Dr. Lovey Newcomer. PATHOLOGY revealed: Site C. BREAST, RIGHT, 5:30 O'CLOCK (HEART CLIP); ULTRASOUND-GUIDED CORE BIOPSY: - INVASIVE MAMMARY CARCINOMA, NO SPECIAL TYPE. Size of invasive carcinoma: 9 mm in this sample. Grade 1. Ductal carcinoma in situ: Not identified. Lymphovascular invasion: Not identified.  Pathology results are CONCORDANT with imaging findings, per Dr. Lovey Newcomer. PATHOLOGY revealed: Site D. LYMPH NODE, RIGHT AXILLA (HYDROMARK CLIP); ULTRASOUND-GUIDED CORE BIOPSY: - METASTATIC MAMMARY CARCINOMA, 6 MM IN SIZE IN THIS SAMPLE. - EXTRANODAL EXTENSION IS PRESENT, LESS THAN 2 MM IN THIS SAMPLE. Pathology results are CONCORDANT with imaging findings, per Dr. Lovey Newcomer. Comment: The three sampled right breast carcinomas are of no special type (ductal). The two largest tumors clinically (A and B) are similar in histologic appearance and are grade 2. The axillary node metastasis is similar to the tumors in A and B. The smallest tumor (C) is grade 1, histologically lower grade. The College of American Pathologists breast biomarker checklist (06/2020) recommends that the largest invasive focus be tested for ER, PR, and HER2 when multiple invasive foci are present. Testing of a smaller carcinoma is recommended if it is a different type or higher grade, which is not applicable in this case. ER, PR, and HER2 testing is performed at this time on the largest carcinoma (A, 3 o'clock). Per patient request, pathology results and recommendations below were discussed with patient, and son Mckell Riecke) by telephone on 01/24/2021. Patient reported biopsy site within normal limits with slight tenderness at the site. Post biopsy care instructions were reviewed, questions were answered and my direct phone number was provided to patient. Patient was instructed to call [facility] if any concerns or questions arise related to the biopsy. RECOMMENDATIONS: Surgical and oncological consultation. Request for surgical and oncological consultation relayed to Al Pimple RN and Tanya Nones RN at [facility] by Electa Sniff RN on 01/24/2021. Pathology results reported by Electa Sniff RN on 01/24/2021. Electronically Signed   By: Lovey Newcomer M.D.   On: 01/25/2021 08:50   Result Date: 01/25/2021 CLINICAL  DATA:  Patient with 3 suspicious right breast masses and enlarged right axillary lymph nodes, for sampling of all 4 lesions. EXAM: ULTRASOUND GUIDED RIGHT BREAST CORE NEEDLE BIOPSY COMPARISON:  Previous exam(s). PROCEDURE: I met with the patient and we discussed the procedure of ultrasound-guided biopsy, including benefits and alternatives. We discussed the high likelihood of a successful procedure. We discussed the risks of the procedure, including infection, bleeding, tissue injury, clip migration, and inadequate sampling. Informed written consent was given. The  usual time-out protocol was performed immediately prior to the procedure. Site 1: Right breast mass 3 o'clock position: Lesion quadrant: Upper inner quadrant Using sterile technique and 1% Lidocaine as local anesthetic, under direct ultrasound visualization, a 14 gauge spring-loaded device was used to perform biopsy of right breast mass 3 o'clock position using a medial approach. At the conclusion of the procedure ribbon shaped tissue marker clip was deployed into the biopsy cavity. Follow up 2 view mammogram was performed and dictated separately. Site 2: Right breast mass 4 o'clock position Lesion quadrant: Lower inner quadrant Using sterile technique and 1% Lidocaine as local anesthetic, under direct ultrasound visualization, a 14 gauge spring-loaded device was used to perform biopsy of right breast mass 4 o'clock position using a medial approach. At the conclusion of the procedure venous tissue marker clip was deployed into the biopsy cavity. Follow up 2 view mammogram was performed and dictated separately. Site 3: Right breast mass 5:30 o'clock position Lesion quadrant: Lower inner quadrant Using sterile technique and 1% Lidocaine as local anesthetic, under direct ultrasound visualization, a 14 gauge spring-loaded device was used to perform biopsy of right breast mass 5:30 o'clock using a medial approach. At the conclusion of the procedure heart tissue  marker clip was deployed into the biopsy cavity. Follow up 2 view mammogram was performed and dictated separately. Site 4: Right axillary node Lesion quadrant: Upper outer quadrant Using sterile technique and 1% Lidocaine as local anesthetic, under direct ultrasound visualization, a 14 gauge spring-loaded device was used to perform biopsy of right axillary node using a lateral approach. At the conclusion of the procedure [facility] tissue marker clip was deployed into the biopsy cavity. Follow up 2 view mammogram was performed and dictated separately. IMPRESSION: Ultrasound guided biopsy of 3 right breast masses and 1 right axillary lymph node. No apparent complications. Electronically Signed: By: Lovey Newcomer M.D. On: 01/19/2021 09:27   [place] RT BREAST BX W LOC DEV EA ADD LESION IMG BX SPEC US GUIDE  Addendum Date: 01/25/2021   ADDENDUM REPORT: 01/25/2021 08:50 ADDENDUM: PATHOLOGY revealed: Site A. BREAST, RIGHT, 3 O'CLOCK (RIBBON CLIP); ULTRASOUND-GUIDED CORE BIOPSY: - INVASIVE MAMMARY CARCINOMA, NO SPECIAL TYPE. Size of invasive carcinoma: 11 mm in this sample. Grade 2. Ductal carcinoma in situ: Not identified. Lymphovascular invasion: Not identified. Pathology results are CONCORDANT with imaging findings, per Dr. Lovey Newcomer. PATHOLOGY revealed: Site B. BREAST, RIGHT, 4 O'CLOCK (VENUS CLIP); ULTRASOUND-GUIDED CORE BIOPSY: - INVASIVE MAMMARY CARCINOMA, NO SPECIAL TYPE. Size of invasive carcinoma: 10 mm in this sample. Grade 2. Ductal carcinoma in situ: Not identified. Lymphovascular invasion: Not identified. Pathology results are CONCORDANT with imaging findings, per Dr. Lovey Newcomer. PATHOLOGY revealed: Site C. BREAST, RIGHT, 5:30 O'CLOCK (HEART CLIP); ULTRASOUND-GUIDED CORE BIOPSY: - INVASIVE MAMMARY CARCINOMA, NO SPECIAL TYPE. Size of invasive carcinoma: 9 mm in this sample. Grade 1. Ductal carcinoma in situ: Not identified. Lymphovascular invasion: Not identified. Pathology results are CONCORDANT with imaging  findings, per Dr. Lovey Newcomer. PATHOLOGY revealed: Site D. LYMPH NODE, RIGHT AXILLA (HYDROMARK CLIP); ULTRASOUND-GUIDED CORE BIOPSY: - METASTATIC MAMMARY CARCINOMA, 6 MM IN SIZE IN THIS SAMPLE. - EXTRANODAL EXTENSION IS PRESENT, LESS THAN 2 MM IN THIS SAMPLE. Pathology results are CONCORDANT with imaging findings, per Dr. Lovey Newcomer. Comment: The three sampled right breast carcinomas are of no special type (ductal). The two largest tumors clinically (A and B) are similar in histologic appearance and are grade 2. The axillary node metastasis is similar to the tumors in A and B.  The smallest tumor (C) is grade 1, histologically lower grade. The College of American Pathologists breast biomarker checklist (06/2020) recommends that the largest invasive focus be tested for ER, PR, and HER2 when multiple invasive foci are present. Testing of a smaller carcinoma is recommended if it is a different type or higher grade, which is not applicable in this case. ER, PR, and HER2 testing is performed at this time on the largest carcinoma (A, 3 o'clock). Per patient request, pathology results and recommendations below were discussed with patient, and son Elizabeth [name]) by telephone on 01/24/2021. Patient reported biopsy site within normal limits with slight tenderness at the site. Post biopsy care instructions were reviewed, questions were answered and my direct phone number was provided to patient. Patient was instructed to call [facility] if any concerns or questions arise related to the biopsy. RECOMMENDATIONS: Surgical and oncological consultation. Request for surgical and oncological consultation relayed to Al Pimple RN and Tanya Nones RN at [facility] by Electa Sniff RN on 01/24/2021. Pathology results reported by Electa Sniff RN on 01/24/2021. Electronically Signed   By: Lovey Newcomer M.D.   On: 01/25/2021 08:50   Result Date: 01/25/2021 CLINICAL DATA:  Patient with 3 suspicious right breast  masses and enlarged right axillary lymph nodes, for sampling of all 4 lesions. EXAM: ULTRASOUND GUIDED RIGHT BREAST CORE NEEDLE BIOPSY COMPARISON:  Previous exam(s). PROCEDURE: I met with the patient and we discussed the procedure of ultrasound-guided biopsy, including benefits and alternatives. We discussed the high likelihood of a successful procedure. We discussed the risks of the procedure, including infection, bleeding, tissue injury, clip migration, and inadequate sampling. Informed written consent was given. The usual time-out protocol was performed immediately prior to the procedure. Site 1: Right breast mass 3 o'clock position: Lesion quadrant: Upper inner quadrant Using sterile technique and 1% Lidocaine as local anesthetic, under direct ultrasound visualization, a 14 gauge spring-loaded device was used to perform biopsy of right breast mass 3 o'clock position using a medial approach. At the conclusion of the procedure ribbon shaped tissue marker clip was deployed into the biopsy cavity. Follow up 2 view mammogram was performed and dictated separately. Site 2: Right breast mass 4 o'clock position Lesion quadrant: Lower inner quadrant Using sterile technique and 1% Lidocaine as local anesthetic, under direct ultrasound visualization, a 14 gauge spring-loaded device was used to perform biopsy of right breast mass 4 o'clock position using a medial approach. At the conclusion of the procedure venous tissue marker clip was deployed into the biopsy cavity. Follow up 2 view mammogram was performed and dictated separately. Site 3: Right breast mass 5:30 o'clock position Lesion quadrant: Lower inner quadrant Using sterile technique and 1% Lidocaine as local anesthetic, under direct ultrasound visualization, a 14 gauge spring-loaded device was used to perform biopsy of right breast mass 5:30 o'clock using a medial approach. At the conclusion of the procedure heart tissue marker clip was deployed into the biopsy  cavity. Follow up 2 view mammogram was performed and dictated separately. Site 4: Right axillary node Lesion quadrant: Upper outer quadrant Using sterile technique and 1% Lidocaine as local anesthetic, under direct ultrasound visualization, a 14 gauge spring-loaded device was used to perform biopsy of right axillary node using a lateral approach. At the conclusion of the procedure [facility] tissue marker clip was deployed into the biopsy cavity. Follow up 2 view mammogram was performed and dictated separately. IMPRESSION: Ultrasound guided biopsy of 3 right breast masses and 1  right axillary lymph node. No apparent complications. Electronically Signed: By: Lovey Newcomer M.D. On: 01/19/2021 09:27   MM Outside Films Mammo  Result Date: 01/17/2021 This examination belongs to an outside facility and is stored here for comparison purposes only.  Contact the originating outside institution for any associated report or interpretation.  MM Outside Films Mammo  Result Date: 01/17/2021 This examination belongs to an outside facility and is stored here for comparison purposes only.  Contact the originating outside institution for any associated report or interpretation.  MM Outside Films Mammo  Result Date: 01/17/2021 This examination belongs to an outside facility and is stored here for comparison purposes only.  Contact the originating outside institution for any associated report or interpretation.  MM Outside Films Mammo  Result Date: 01/11/2021 This examination belongs to an outside facility and is stored here for comparison purposes only.  Contact the originating outside institution for any associated report or interpretation.   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  Given her multifocal disease as well as positive lymph node, patient may benefit from mastectomy.  Also, given her underlying schizophrenia and anxiety not having to undergo daily XRT may be of benefit.  Patient has already indicated she will likely decline chemotherapy, but will further discuss and possibly order Oncotype pending on final pathology results.  She will benefit from an aromatase inhibitor for 5 years at the conclusion of all her treatments.  Patient will return to clinic 1 to 2 weeks after her surgery to discuss her final pathology results and additional treatment planning.    I spent a total of 60 minutes reviewing chart data, face-to-face evaluation with the patient, counseling and coordination of care as detailed above.  Patient expressed understanding and was in agreement with this plan. She also understands that She can call clinic at any time with any questions, concerns, or complaints.    Cancer Staging  Invasive ductal carcinoma of right breast [facility]) Staging form: Breast, AJCC 8th Edition - Clinical stage from 02/01/2021: Stage IIA (cT3, cN1, cM0, G2, ER+, PR+, HER2-) - Signed by [name], MD on 02/01/2021 Histologic grading system: 3 grade system   [name], MD   02/01/2021 3:46 PM

## 2021-01-29 LAB — SURGICAL PATHOLOGY

## 2021-01-31 ENCOUNTER — Other Ambulatory Visit: Payer: Self-pay

## 2021-01-31 ENCOUNTER — Inpatient Hospital Stay: Payer: Medicare HMO | Attending: Oncology | Admitting: Oncology

## 2021-01-31 ENCOUNTER — Inpatient Hospital Stay: Payer: Medicare HMO

## 2021-01-31 ENCOUNTER — Encounter (INDEPENDENT_AMBULATORY_CARE_PROVIDER_SITE_OTHER): Payer: Self-pay

## 2021-01-31 ENCOUNTER — Encounter: Payer: Self-pay | Admitting: Oncology

## 2021-01-31 ENCOUNTER — Encounter: Payer: Self-pay | Admitting: *Deleted

## 2021-01-31 DIAGNOSIS — C50811 Malignant neoplasm of overlapping sites of right female breast: Secondary | ICD-10-CM | POA: Insufficient documentation

## 2021-01-31 DIAGNOSIS — Z87891 Personal history of nicotine dependence: Secondary | ICD-10-CM | POA: Diagnosis not present

## 2021-01-31 DIAGNOSIS — Z170 Estrogen receptor positive status [ER+]: Secondary | ICD-10-CM | POA: Insufficient documentation

## 2021-01-31 DIAGNOSIS — C50911 Malignant neoplasm of unspecified site of right female breast: Secondary | ICD-10-CM

## 2021-01-31 DIAGNOSIS — Z90710 Acquired absence of both cervix and uterus: Secondary | ICD-10-CM | POA: Insufficient documentation

## 2021-01-31 DIAGNOSIS — Z803 Family history of malignant neoplasm of breast: Secondary | ICD-10-CM | POA: Insufficient documentation

## 2021-01-31 NOTE — Progress Notes (Signed)
Pt and son in for new patient visit.  Daughter is outside clinic on phone.  Pt has history of mental illness and becomes agitated easily.  Son request himself or sister is called with any question or concerns and patient verbalized agreement.   Established Patient Office Visit  Subjective:  Patient ID: Elizabeth [name], female    DOB: [birth year]  Age: 71 y.o. MRN: [id number]  CC:  Chief Complaint  Patient presents with   New Patient (Initial Visit)    HPI Elizabeth [name] presents for breast cancer  Past Medical History:  Diagnosis Date   Acute cholecystitis 2016   Anemia    Anemia 2016   Anxiety    panic attacks   Anxiety and depression    Bipolar 1 disorder ([place])    Bowel obstruction ([place]) 2017   Common bile duct dilatation 2015   Dyspnea    Edema    Fibromyalgia    GI bleed 2017   History of kidney stones 01/21/2009   HSV-1 infection    Hyperglycemia    Hyperlipidemia    Hypokalemia    Hypothyroidism    Liver cyst    Malignant neoplasm of right female breast, unspecified estrogen receptor status, unspecified site of breast ([place])    Memory loss    Osteopenia    Panic disorder    Schizophrenia ([place])    Ventral hernia     Past Surgical History:  Procedure Laterality Date   ABDOMINAL HYSTERECTOMY     BREAST BIOPSY Right 01/19/2021   [place] Bx, ribbon, path pending   BREAST BIOPSY Right 01/19/2021   [place] Bx, Venus, path pending   BREAST BIOPSY Right 01/19/2021   [place] Bx, heart, path pending   BREAST BIOPSY Right 01/19/2021   [place] BX Axilla, Coil, path pending   CESAREAN SECTION     CHOLECYSTECTOMY OPEN  11/17/2013   with repair of bile duct-Dr. Marina Gravel   OPEN REDUCTION INTERNAL FIXATION (ORIF) DISTAL RADIAL FRACTURE Right 12/20/2014   Procedure: OPEN REDUCTION INTERNAL FIXATION (ORIF) RIGHT DISTAL RADIAL FRACTURE;  Surgeon: Renette Butters, MD;  Location: [place];  Service: Orthopedics;  Laterality: Right;   STOMACH SURGERY      Family History   Problem Relation Age of Onset   Heart disease Father    Breast cancer Paternal Grandmother    Asthma Daughter    Breast cancer Paternal Aunt    Kidney disease Paternal Aunt    Bladder Cancer Neg Hx     Social History   Socioeconomic History   Marital status: Married    Spouse name: Not on file   Number of children: Not on file   Years of education: Not on file   Highest education level: Not on file  Occupational History   Not on file  Tobacco Use   Smoking status: Former    Packs/day: 0.00    Years: 0.00    Pack years: 0.00    Types: Cigarettes    Quit date: 11/03/1973    Years since quitting: 47.2    Passive exposure: Past   Smokeless tobacco: Never   Tobacco comments:    Smoked briefly as a teen- lives in secondhand smoke.   Vaping Use   Vaping Use: Never used  Substance and Sexual Activity   Alcohol use: No    Alcohol/week: 0.0 standard drinks   Drug use: No   Sexual activity: Not on file  Other Topics Concern   Not on file  Social History Narrative   Not on file   Social Determinants of Health   Financial Resource Strain: Not on file  Food Insecurity: Not on file  Transportation Needs: Not on file  Physical Activity: Not on file  Stress: Not on file  Social Connections: Not on file  Intimate Partner Violence: Not on file    Outpatient Medications Prior to Visit  Medication Sig Dispense Refill   atorvastatin (LIPITOR) 20 MG tablet Take 1 tablet by mouth daily.     levothyroxine (SYNTHROID, LEVOTHROID) 88 MCG tablet Take by mouth.     albuterol (PROVENTIL HFA;VENTOLIN HFA) 108 (90 BASE) MCG/ACT inhaler Inhale 2 puffs into the lungs every 6 (six) hours as needed for wheezing or shortness of breath. (Patient not taking: Reported on 09/04/2020) 1 Inhaler 6   alendronate (FOSAMAX) 70 MG tablet Take 1 tablet by mouth once a week. (Patient not taking: Reported on 01/31/2021)     citalopram (CELEXA) 20 MG tablet Take by mouth. (Patient not taking: Reported on  09/04/2020)     clonazePAM (KLONOPIN) 1 MG tablet Take 1 mg by mouth 2 (two) times daily. (Patient not taking: Reported on 01/31/2021)     docusate sodium (COLACE) 100 MG capsule Take 1 capsule (100 mg total) by mouth 2 (two) times daily. (Patient not taking: Reported on 09/04/2020) 10 capsule 0   ferrous sulfate 325 (65 FE) MG tablet Take 1 tablet by mouth daily with breakfast. (Patient not taking: Reported on 01/31/2021)     mirtazapine (REMERON) 15 MG tablet TAKE 1/2 (ONE-HALF) TABLET BY MOUTH ONCE DAILY FOR 2 WEEKS INCREASE TO 1 TABLET AT BEDTIME AS DIRECTED (Patient not taking: Reported on 01/31/2021)     PARoxetine (PAXIL) 10 MG tablet Take 1 tablet by mouth daily. (Patient not taking: Reported on 01/31/2021)     HYDROcodone-acetaminophen (NORCO/VICODIN) 5-325 MG tablet Take 1 tablet by mouth every 6 (six) hours as needed for moderate pain. (Patient not taking: Reported on 09/04/2020) 15 tablet 0   No facility-administered medications prior to visit.    Allergies  Allergen Reactions   Oxycodone-Acetaminophen Hives    "has been taking without any problem"   Penicillins Hives    Hives Hives     ROS Review of Systems    Objective:    Physical Exam  BP 131/71 (BP Location: Left Arm, Patient Position: Sitting)    Pulse 83    Temp (!) 97 F (36.1 C) (Tympanic)    Resp 16    Wt 115 lb (52.2 kg)    SpO2 97%    BMI 21.73 kg/m  Wt Readings from Last 3 Encounters:  01/31/21 115 lb (52.2 kg)  09/03/20 141 lb 1.5 oz (64 kg)  11/05/17 146 lb 3.2 oz (66.3 kg)     Health Maintenance Due  Topic Date Due   HEMOGLOBIN A1C  Never done   FOOT EXAM  Never done   OPHTHALMOLOGY EXAM  Never done   URINE MICROALBUMIN  Never done   Hepatitis C Screening  Never done   Zoster Vaccines- Shingrix (1 of 2) Never done   COLONOSCOPY (Pts 45-30yrs Insurance coverage will need to be confirmed)  Never done   TETANUS/TDAP  03/08/2003   DEXA SCAN  Never done   Pneumonia Vaccine 68+ Years old (2 - PPSV23 if  available, else PCV20) 07/31/2017    There are no preventive care reminders to display for this patient.  No results found for: TSH Lab Results  Component Value Date  WBC 8.5 09/03/2020   HGB 11.6 (L) 09/03/2020   HCT 36.4 09/03/2020   MCV 92.6 09/03/2020   PLT 348 09/03/2020   Lab Results  Component Value Date   NA 140 09/03/2020   K 4.0 09/03/2020   CO2 27 09/03/2020   GLUCOSE 96 09/03/2020   BUN 10 09/03/2020   CREATININE 0.90 09/03/2020   BILITOT 1.4 (H) 12/18/2015   ALKPHOS 188 (H) 12/18/2015   AST 35 12/18/2015   ALT 68 (H) 12/18/2015   PROT 7.9 12/18/2015   ALBUMIN 2.9 (L) 12/18/2015   CALCIUM 9.1 09/03/2020   ANIONGAP 7 09/03/2020   GFR 61.67 11/03/2013   No results found for: CHOL No results found for: HDL No results found for: LDLCALC No results found for: TRIG No results found for: CHOLHDL No results found for: HGBA1C    Assessment & Plan:   Problem List Items Addressed This Visit       Oncology   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[place])    No orders of the defined types were placed in this encounter.   Follow-up: No follow-ups on file.    Robina Ade, RN

## 2021-01-31 NOTE — Progress Notes (Signed)
Met patient and her son [name] at her initial medical oncology consult with Dr. Grayland Ormond.  Patient's step daughter, Regino Schultze was on the phone during the conversation.  Patient does not make eye contact.  Gave patient breast cancer educational literature, "My Breast Cancer Treatment Handbook" by Josephine Igo, RN.   Dr. Grayland Ormond reviewed to have surgery first, then follow up with him for a final treatment plan.  Patient seems very hesitant.  They were encouraged to let me know the surgery date, and I will schedule her follow up with Dr. Grayland Ormond.

## 2021-01-31 NOTE — Progress Notes (Signed)
Pt and son in for new patient visit.

## 2021-02-02 ENCOUNTER — Other Ambulatory Visit: Payer: Self-pay | Admitting: Surgery

## 2021-02-02 ENCOUNTER — Encounter: Payer: Self-pay | Admitting: Licensed Clinical Social Worker

## 2021-02-02 DIAGNOSIS — C773 Secondary and unspecified malignant neoplasm of axilla and upper limb lymph nodes: Secondary | ICD-10-CM

## 2021-02-02 DIAGNOSIS — C50912 Malignant neoplasm of unspecified site of left female breast: Secondary | ICD-10-CM

## 2021-02-02 DIAGNOSIS — C50911 Malignant neoplasm of unspecified site of right female breast: Secondary | ICD-10-CM

## 2021-02-02 NOTE — Progress Notes (Signed)
[place] Work  Initial Assessment   Elizabeth [name] is a 71 y.o. year old female contacted by phone. Clinical Social Work was referred by A. [place] Carbo RN for assessment of psychosocial needs.   SDOH (Social Determinants of Health) assessments performed: No   Distress Screen completed: No No flowsheet data found.    Family/Social Information:  Housing Arrangement: patient lives alone, has family supports Family members/support persons in your life? Family Transportation concerns: yes, she is dependent on family assistance for transportation  Employment: Retired and Disabled. Income source: Banker concerns: No Type of concern:  N/A Food access concerns: no Religious or spiritual practice: no Medication Concerns: no  Services Currently in place:  N/A  Coping/ Adjustment to diagnosis: Patient understands treatment plan and what happens next? yes Concerns about diagnosis and/or treatment: Feelings of anger or sadness, Overwhelmed by information, and palliative care and end of life decisions Patient reported stressors: Transportation, Anxiety, Adjusting to my illness, and Facing my mortality Hopes and priorities: N/A Patient enjoys  N/A Current coping skills/ strengths: Ability for insight  and Supportive family/friends     SUMMARY: Current SDOH Barriers:  Transportation and Social Isolation  Clinical Social Work Clinical Goal(s):  patient will work with SW to address concerns related to end of life decisions, anxiety over diagnosis and familial interactions  Interventions: Discussed common feeling and emotions when being diagnosed with cancer, and the importance of support during treatment Informed patient of the support team roles and support services at [facility] Provided [place] contact information and encouraged patient to call with any questions or concerns Referred patient to Billey Chang NP, Palliative Care and counseling , financial  navigator for [employer] application   Follow Up Plan: Patient will contact CSW with any support or resource needs and CSW will follow-up with patient by phone  Patient verbalizes understanding of plan: Yes  CSW will research transportation services for patient and will follow-up with information.  Adelene Amas , LCSW

## 2021-02-02 NOTE — Progress Notes (Signed)
Patient called with concerns about insurance, and counseling needs related to new diagnosis and treatment.  Referral submitted to [facility].

## 2021-02-02 NOTE — Progress Notes (Signed)
[place] Work  Clinical Social Work was referred by Felicie Morn RN  for assessment of psychosocial needs.  Clinical Social Worker contacted patient by phone  to offer support and assess for needs.  CSW called and introduced myself and call dropped, twice.  Will try calling the patient again at a later time.      Elizabeth [name], [place]

## 2021-02-05 NOTE — Progress Notes (Signed)
[place] Work  Initial Assessment   Elizabeth [name] is a 71 y.o. year old female contacted by phone. Clinical Social Work was referred by A. Shaver for assessment of psychosocial needs.   SDOH (Social Determinants of Health) assessments performed: No   Distress Screen completed: No No flowsheet data found.    Family/Social Information:  Housing Arrangement: patient lives alone. Family members/support persons in your life? Family Transportation concerns: no  Employment: Retired. Income source: Pension/Retirement Financial concerns: No Type of concern: None Food access concerns: no Religious or spiritual practice: N/A Medication Concerns: no  Services Currently in place:  N/A  Coping/ Adjustment to diagnosis: Patient understands treatment plan and what happens next? yes Concerns about diagnosis and/or treatment:  Patient has a definite plan for stop treatment and start palliative or hospice care if the cancer has metastasized Patient reported stressors: Children and Adjusting to my illness Hopes and priorities:  Patient stated she hopes her children will respect her decisions if she decides on palliative care instead of active treatment Patient enjoys  Her space and independence Current coping skills/ strengths: Ability for insight , Average or above average intelligence , Capable of independent living , and Supportive family/friends     SUMMARY: Current SDOH Barriers:  Transportation, Mental Health Concerns , and lack of social interaction  Clinical Social Work Clinical Goal(s):  patient will work with SW to address concerns related to anxiety over possible metastasis diagnosis and her possible decision to forgo treatment and choose palliative care  patient will follow up with Billey Chang NP Palliative Care* as directed by SW Patient will discuss her concerns with her oncologist  Interventions: Discussed common feeling and emotions when being diagnosed with  cancer, and the importance of support during treatment Informed patient of the support team roles and support services at [facility] Provided CSW contact information and encouraged patient to call with any questions or concerns Referred patient to Billey Chang NP Palliative care   Follow Up Plan: Patient will contact CSW with any support or resource needs and CSW will follow-up with patient by phone  Patient verbalizes understanding of plan: Yes    Adelene Amas , LCSW

## 2021-02-08 ENCOUNTER — Ambulatory Visit
Admission: RE | Admit: 2021-02-08 | Discharge: 2021-02-08 | Disposition: A | Discharge status: Home / Self Care | Payer: Medicare HMO | Source: Ambulatory Visit | Attending: Surgery | Admitting: Surgery

## 2021-02-08 ENCOUNTER — Ambulatory Visit: Admission: RE | Admit: 2021-02-08 | Payer: Medicare HMO | Source: Ambulatory Visit

## 2021-02-08 ENCOUNTER — Other Ambulatory Visit: Payer: Self-pay

## 2021-02-08 DIAGNOSIS — C50912 Malignant neoplasm of unspecified site of left female breast: Secondary | ICD-10-CM | POA: Diagnosis present

## 2021-02-08 DIAGNOSIS — C773 Secondary and unspecified malignant neoplasm of axilla and upper limb lymph nodes: Secondary | ICD-10-CM | POA: Diagnosis present

## 2021-02-08 MED ORDER — IOHEXOL 300 MG/ML  SOLN
80.0000 mL | Freq: Once | INTRAMUSCULAR | Status: AC | PRN
  Administered 2021-02-08: 80 mL via INTRAVENOUS

## 2021-02-09 LAB — POCT I-STAT CREATININE: Creatinine, Ser: 0.9 mg/dL (ref 0.44–1.00)

## 2021-02-15 ENCOUNTER — Other Ambulatory Visit: Payer: Self-pay

## 2021-02-15 ENCOUNTER — Emergency Department (HOSPITAL_COMMUNITY): Payer: Medicare HMO

## 2021-02-15 ENCOUNTER — Emergency Department (HOSPITAL_COMMUNITY)
Admission: EM | Admit: 2021-02-15 | Discharge: 2021-02-15 | Disposition: A | Discharge status: Home / Self Care | Payer: Medicare HMO | Attending: Emergency Medicine | Admitting: Emergency Medicine

## 2021-02-15 ENCOUNTER — Encounter (HOSPITAL_COMMUNITY): Payer: Self-pay

## 2021-02-15 DIAGNOSIS — W010XXA Fall on same level from slipping, tripping and stumbling without subsequent striking against object, initial encounter: Secondary | ICD-10-CM | POA: Diagnosis not present

## 2021-02-15 DIAGNOSIS — M542 Cervicalgia: Secondary | ICD-10-CM | POA: Diagnosis not present

## 2021-02-15 DIAGNOSIS — Y9301 Activity, walking, marching and hiking: Secondary | ICD-10-CM | POA: Diagnosis not present

## 2021-02-15 DIAGNOSIS — S0083XA Contusion of other part of head, initial encounter: Secondary | ICD-10-CM

## 2021-02-15 DIAGNOSIS — Y92481 Parking lot as the place of occurrence of the external cause: Secondary | ICD-10-CM | POA: Insufficient documentation

## 2021-02-15 DIAGNOSIS — S01112A Laceration without foreign body of left eyelid and periocular area, initial encounter: Secondary | ICD-10-CM | POA: Diagnosis not present

## 2021-02-15 DIAGNOSIS — Z23 Encounter for immunization: Secondary | ICD-10-CM | POA: Diagnosis not present

## 2021-02-15 DIAGNOSIS — W19XXXA Unspecified fall, initial encounter: Secondary | ICD-10-CM

## 2021-02-15 DIAGNOSIS — E039 Hypothyroidism, unspecified: Secondary | ICD-10-CM | POA: Diagnosis not present

## 2021-02-15 DIAGNOSIS — Z79899 Other long term (current) drug therapy: Secondary | ICD-10-CM | POA: Insufficient documentation

## 2021-02-15 DIAGNOSIS — Z853 Personal history of malignant neoplasm of breast: Secondary | ICD-10-CM | POA: Diagnosis not present

## 2021-02-15 DIAGNOSIS — S0181XA Laceration without foreign body of other part of head, initial encounter: Secondary | ICD-10-CM

## 2021-02-15 DIAGNOSIS — S0990XA Unspecified injury of head, initial encounter: Secondary | ICD-10-CM | POA: Diagnosis present

## 2021-02-15 MED ORDER — LIDOCAINE-EPINEPHRINE (PF) 2 %-1:200000 IJ SOLN
10.0000 mL | Freq: Once | INTRAMUSCULAR | Status: AC
  Administered 2021-02-15: 10 mL
  Filled 2021-02-15: qty 20

## 2021-02-15 MED ORDER — TETANUS-DIPHTH-ACELL PERTUSSIS 5-2.5-18.5 LF-MCG/0.5 IM SUSY
0.5000 mL | PREFILLED_SYRINGE | Freq: Once | INTRAMUSCULAR | Status: AC
  Administered 2021-02-15: 0.5 mL via INTRAMUSCULAR
  Filled 2021-02-15: qty 0.5

## 2021-02-15 NOTE — ED Provider Notes (Signed)
[facility] EMERGENCY DEPARTMENT Provider Note   CSN: [id number] Arrival date & time: 02/15/21  1733     History  Chief Complaint  Patient presents with   Elizabeth [name]    Elizabeth [name] is a 71 y.o. female.  Pt is a 71 yo wf with a hx of hyperlipidemia, hypothyroidism, anxiety, breast cancer, fibromyalgia, schizophrenia, and bipolar d/o.  She was walking with a friend and tripped and fell onto her face.  She is not on blood thinners.  She denies loc.  She is awake and alert.  She did sustain a lac to her forehead.  She c/o neck pain and is in a c-collar.      Home Medications Prior to Admission medications   Medication Sig Start Date End Date Taking? Authorizing Provider  albuterol (PROVENTIL HFA;VENTOLIN HFA) 108 (90 BASE) MCG/ACT inhaler Inhale 2 puffs into the lungs every 6 (six) hours as needed for wheezing or shortness of breath. Patient not taking: Reported on 09/04/2020 11/03/13   Juanito Doom, MD  alendronate (FOSAMAX) 70 MG tablet Take 1 tablet by mouth once a week. Patient not taking: Reported on 01/31/2021 03/09/20 03/09/21  [provider]  atorvastatin (LIPITOR) 20 MG tablet Take 1 tablet by mouth daily.    [provider]  citalopram (CELEXA) 20 MG tablet Take by mouth. Patient not taking: Reported on 09/04/2020    [provider]  clonazePAM (KLONOPIN) 1 MG tablet Take 1 mg by mouth 2 (two) times daily. Patient not taking: Reported on 01/31/2021    [provider]  docusate sodium (COLACE) 100 MG capsule Take 1 capsule (100 mg total) by mouth 2 (two) times daily. Patient not taking: Reported on 09/04/2020 12/20/14   Lovett Calender, PA-C  ferrous sulfate 325 (65 FE) MG tablet Take 1 tablet by mouth daily with breakfast. Patient not taking: Reported on 01/31/2021 01/22/20   [provider]  levothyroxine (SYNTHROID, LEVOTHROID) 88 MCG tablet Take by mouth.    [provider]  mirtazapine (REMERON) 15  MG tablet TAKE 1/2 (ONE-HALF) TABLET BY MOUTH ONCE DAILY FOR 2 WEEKS INCREASE TO 1 TABLET AT BEDTIME AS DIRECTED Patient not taking: Reported on 01/31/2021 10/25/20   [provider]  PARoxetine (PAXIL) 10 MG tablet Take 1 tablet by mouth daily. Patient not taking: Reported on 01/31/2021 09/09/20   [provider]      Allergies    Oxycodone-acetaminophen and Penicillins    Review of Systems   Review of Systems  HENT:  Positive for facial swelling.   Musculoskeletal:  Positive for neck pain.  Skin:  Positive for wound.  All other systems reviewed and are negative.  Physical Exam Updated Vital Signs BP 120/78    Pulse 80    Resp 16    Ht 5\' 1"  (1.549 m)    Wt 52.2 kg    SpO2 100%    BMI 21.73 kg/m  Physical Exam Vitals and nursing note reviewed.  Constitutional:      Appearance: Normal appearance.  HENT:     Head:      Right Ear: External ear normal.     Left Ear: External ear normal.     Nose: Nose normal.     Mouth/Throat:     Mouth: Mucous membranes are moist.     Pharynx: Oropharynx is clear.  Eyes:     Extraocular Movements: Extraocular movements intact.     Pupils: Pupils are equal, round, and reactive to light.  Neck:     Comments: In c-collar Cardiovascular:     Rate and Rhythm: Normal rate and regular rhythm.     Pulses: Normal pulses.     Heart sounds: Normal heart sounds.  Pulmonary:     Effort: Pulmonary effort is normal.     Breath sounds: Normal breath sounds.  Abdominal:     General: Abdomen is flat. Bowel sounds are normal.     Palpations: Abdomen is soft.  Musculoskeletal:        General: Normal range of motion.  Skin:    General: Skin is warm.     Capillary Refill: Capillary refill takes less than 2 seconds.  Neurological:     General: No focal deficit present.     Mental Status: She is alert and oriented to person, place, and time.  Psychiatric:        Mood and Affect: Mood normal.        Behavior: Behavior normal.         Thought Content: Thought content normal.        Judgment: Judgment normal.    ED Results / Procedures / Treatments   Labs (all labs ordered are listed, but only abnormal results are displayed) Labs Reviewed - No data to display  EKG None  Radiology CT Head Wo Contrast  Result Date: 02/15/2021 CLINICAL DATA:  Trauma EXAM: CT HEAD WITHOUT CONTRAST TECHNIQUE: Contiguous axial images were obtained from the base of the skull through the vertex without intravenous contrast. RADIATION DOSE REDUCTION: This exam was performed according to the departmental dose-optimization program which includes automated exposure control, adjustment of the mA and/or kV according to patient size and/or use of iterative reconstruction technique. COMPARISON:  None. FINDINGS: Brain: No acute intracranial findings are seen. There are no signs of bleeding within the cranium. Ventricles are not dilated. Cortical sulci are prominent. Vascular: Unremarkable. Skull: No fracture is seen. There is subcutaneous contusion/hematoma in the left frontal scalp. Sinuses/Orbits: There is mucosal thickening in frontal, ethmoid, sphenoid and left maxillary sinuses. Other: None IMPRESSION: No acute intracranial findings are seen.  Atrophy. There is subcutaneous hematoma in the left frontal scalp. No fracture is seen in the calvarium. Chronic sinusitis. Electronically Signed   By: Elmer Picker M.D.   On: 02/15/2021 19:21   CT Cervical Spine Wo Contrast  Result Date: 02/15/2021 CLINICAL DATA:  Fall.  Neck trauma (Age >= 65y) EXAM: CT CERVICAL SPINE WITHOUT CONTRAST TECHNIQUE: Multidetector CT imaging of the cervical spine was performed without intravenous contrast. Multiplanar CT image reconstructions were also generated. RADIATION DOSE REDUCTION: This exam was performed according to the departmental dose-optimization program which includes automated exposure control, adjustment of the mA and/or kV according to patient size and/or use of  iterative reconstruction technique. COMPARISON:  None. FINDINGS: Alignment: Normal Skull base and vertebrae: No acute fracture. No primary bone lesion or focal pathologic process. Soft tissues and spinal canal: No prevertebral fluid or swelling. No visible canal hematoma. Disc levels: Diffuse degenerative disc disease with disc space narrowing and early spurring. Bilateral degenerative facet disease. Upper chest: No acute findings Other: None IMPRESSION: No acute bony abnormality. Electronically Signed   By: Rolm Baptise M.D.   On: 02/15/2021 20:14   CT Maxillofacial Wo Contrast  Result Date: 02/15/2021 CLINICAL DATA:  Fall.  Facial trauma, blunt EXAM: CT MAXILLOFACIAL WITHOUT CONTRAST TECHNIQUE: Multidetector CT imaging of the maxillofacial structures was performed. Multiplanar CT image reconstructions were also generated. RADIATION DOSE REDUCTION: This exam was performed  according to the departmental dose-optimization program which includes automated exposure control, adjustment of the mA and/or kV according to patient size and/or use of iterative reconstruction technique. COMPARISON:  None. FINDINGS: Osseous: No fracture or mandibular dislocation. No destructive process. Orbits: Negative. No traumatic or inflammatory finding. Sinuses: Mucosal thickening in the left frontal sinus, left ethmoid air cells and left maxillary sinus. Air-fluid levels in the sphenoid sinuses bilaterally and left maxillary sinus. Findings likely acute on chronic sinusitis. Mastoid air cells clear. Soft tissues: Soft tissue swelling over the forehead. Limited intracranial: See head CT report IMPRESSION: No facial or orbital fracture. Acute on chronic sinusitis. Electronically Signed   By: Rolm Baptise M.D.   On: 02/15/2021 20:13    Procedures .Marland KitchenLaceration Repair  Date/Time: 02/15/2021 6:26 PM Performed by: Isla Pence, MD Authorized by: Isla Pence, MD   Consent:    Consent obtained:  Verbal   Consent given by:   Patient   Risks, benefits, and alternatives were discussed: yes   Universal protocol:    Patient identity confirmed:  Verbally with patient Anesthesia:    Anesthesia method:  Local infiltration   Local anesthetic:  Lidocaine 2% WITH epi Laceration details:    Location:  Face   Face location:  L eyebrow   Length (cm):  1 Pre-procedure details:    Preparation:  Patient was prepped and draped in usual sterile fashion Exploration:    Hemostasis achieved with:  Epinephrine   Contaminated: no   Treatment:    Area cleansed with:  Saline   Amount of cleaning:  Standard   Irrigation solution:  Sterile saline Skin repair:    Repair method:  Sutures   Suture size:  5-0   Suture material:  Prolene   Suture technique:  Simple interrupted   Number of sutures:  3 Approximation:    Approximation:  Close Repair type:    Repair type:  Simple Post-procedure details:    Dressing:  Non-adherent dressing   Procedure completion:  Tolerated well, no immediate complications    Medications Ordered in ED Medications  lidocaine-EPINEPHrine (XYLOCAINE W/EPI) 2 %-1:200000 (PF) injection 10 mL (10 mLs Infiltration Given 02/15/21 1753)  Tdap (BOOSTRIX) injection 0.5 mL (0.5 mLs Intramuscular Given 02/15/21 1750)    ED Course/ Medical Decision Making/ A&P                           Medical Decision Making Amount and/or Complexity of Data Reviewed Radiology: ordered.  Risk Prescription drug management.   Pt had a mechanical fall.  Lac sutured.  Abrasions to nose dermabonded.  Tetanus updated.  Pt's CT scans were reviewed.  They do not show anything acute.  Pt refused her chest and pelvis xrays.  She does not hurt there and is able to ambulate without any problems.  I did speak with her son and reviewed ct scans with him.  Pt is stable for d/c.  Return if worse.  F/u with pcp.  Sutures to be removed in 5-7 days.        Final Clinical Impression(s) / ED Diagnoses Final diagnoses:   Fall, initial encounter  Contusion of face, initial encounter  Facial laceration, initial encounter    Rx / DC Orders ED Discharge Orders     None         Isla Pence, MD 02/15/21 2055

## 2021-02-15 NOTE — ED Triage Notes (Signed)
Pt BIB EMS after tripping over a parking curb. EMS states that pt landed face first and sustained 1/2 inch lac to her forehead. Pt complains of lower back pain and neck pain as well asa tender R arm. Pt is not on a blood thinner.   VSS stable w/ EMS.

## 2021-02-15 NOTE — ED Notes (Signed)
Discharge instructions reviewed with patient and friend. Patient and friend verbalized understanding of instructions. Follow-up care and medications were reviewed. Patient ambulatory with steady gait. VSS upon discharge.

## 2021-02-19 ENCOUNTER — Ambulatory Visit: Payer: Self-pay | Admitting: Surgery

## 2021-02-19 ENCOUNTER — Encounter: Payer: Self-pay | Admitting: Licensed Clinical Social Worker

## 2021-02-19 NOTE — Progress Notes (Signed)
[place] CSW Progress Note  Clinical Education officer, museum contacted patient by phone to follow-up on initial referral. Patient stated she fell last week and hit her head and face.  Patient stated she was still "very red" but otherwise felt okay.  Patient stated she was able to eat without any issues and her daughter and grand-daughter were staying with her until after her surgery next week. CSW spoke with patient's grand-daughter Alwyn Ren who stated patient was feeling okay and was able to do most ADLs independently but she and her mother would be there for a while to assist.  CSW encouraged Alwyn Ren and patient to contact CSW if they had any questions or concerns.    Adelene Amas , LCSW

## 2021-02-19 NOTE — H&P (View-Only) (Signed)
Subjective:   CC: Breast cancer metastasized to axillary lymph node, left (CMS-HCC) [C50.912, C77.3] HPI:  Elizabeth [name] is a 71 y.o. female who was referred by Glee Arvin* for evaluation of above. Change was noted on with palpable mass by patient.  She otherwise only has complaints of rib pain on the left side. Unsure how long it has been going on.  Denies headache or changes in BM, abdominal pain.     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   Past Surgical History:  has a past surgical history that includes Cesarean section; Abdominal surgery; Tumor removed; Cholecystectomy; endoscopic ultrasound (N/A, 01/28/2014); Hysterectomy (1987); egd (N/A, 12/26/2015); resection small bowel (N/A, 01/09/2016); and gastrojejunostomy (N/A, 01/09/2016).   Family History: family history includes Alzheimer's disease in her mother; Suicidality in her sister.   Social History:  reports that she has never smoked. She has never used smokeless tobacco. She reports that she does not drink alcohol and does not use drugs.   Current Medications: has a current medication list which includes the following prescription(s): levothyroxine, alendronate, atorvastatin, citalopram, clonazepam, ferrous sulfate, mirtazapine, and paroxetine.   Allergies:  Allergies as of 01/31/2021 - Reviewed 01/31/2021 Allergen Reaction Noted  Percocet [oxycodone-acetaminophen] Other (See Comments) 12/21/2013     ROS:  A 15 point review of systems was performed and was negative except as noted in HPI    Objective:   BP 114/69    Pulse 95    Ht 165.1 cm (_0 )    Wt 52.4 kg (115 lb 9.6 oz)    BMI 19.24 kg/m    Constitutional :  No distress, cooperative, alert Lymphatics/Throat:  Supple with no lymphadenopathy Respiratory:  Clear to auscultation bilaterally Cardiovascular:  Regular rate and rhythm Gastrointestinal: Soft, non-tender, non-distended, no organomegaly. Musculoskeletal: Steady gait and movement. TTP along left mid-postieror ribs Skin: Cool and moist, no surgical scars around right breast or axilla Psychiatric: Normal affect, non-agitated, not confused Breast: Normal appearance and no palpable abnormality in left breast, and axilla.  Right breast with recent biopsy changes and visible and palpable firm, large superficial node noted in right axilla.  no additional palpable nodes deeper on palpation. Chaperone present for exam.     LABS:  Reason for Addendum #1:  Breast Biomarker Results   Specimen Submitted:  A. Breast, right, 3:00, ribbon clip; biopsy  B. Breast, right, 4:00 Venus clip; biopsy  C. Breast, right, 5:30 heart clip; biopsy  D. Lymph node, right axilla, hydroMARK clip; biopsy   Clinical History: Three suspicious right breast masses. Two are palpable  and there is involvement of overlying skin. Abnormal right axillary  lymph nodes.  Site 1: 3 o'clock 1 cm from nipple, 2 cm, ribbon clip  Site 2: 4 o'clock 6 cm from nipple, 1.4 cm, Venus clip  Site 3: 5:30 o'clock 7 cm from nipple, 1.1 cm, heart clip  Site 4: axillary lymph node, HydroMARK clip  Post biopsy mammograms show all marker clips in appropriate position.      DIAGNOSIS:  A. BREAST, RIGHT, 3 O'CLOCK (RIBBON CLIP); ULTRASOUND-GUIDED CORE  BIOPSY:  - INVASIVE MAMMARY CARCINOMA, NO SPECIAL TYPE.  Size of invasive carcinoma: 11 mm in this  sample  Histologic grade of invasive carcinoma: Grade 2                       Glandular/tubular differentiation score: 3                       Nuclear  pleomorphism score: 3                       Mitotic rate score: 1                       Total score: 7  Ductal carcinoma in situ: Not identified  Lymphovascular invasion: Not identified  ER/PR/HER2: Immunohistochemistry will be performed on block A1, with  reflex to [place] for HER2 2+. The results will be reported in an addendum.  See comment below.   B. BREAST, RIGHT, 4 O'CLOCK (VENUS CLIP); ULTRASOUND-GUIDED CORE BIOPSY:  - INVASIVE MAMMARY CARCINOMA, NO SPECIAL TYPE.  Size of invasive carcinoma: 10 mm in this sample  Histologic grade of invasive carcinoma: Grade 2                       Glandular/tubular differentiation score: 3                       Nuclear pleomorphism score: 3                       Mitotic rate score: 1                       Total score: 7  Ductal carcinoma in situ: Not identified  Lymphovascular invasion: Not identified   C. BREAST, RIGHT, 5:30 O'CLOCK (HEART CLIP); ULTRASOUND-GUIDED CORE  BIOPSY:  - INVASIVE MAMMARY CARCINOMA, NO SPECIAL TYPE.  Size of invasive carcinoma: 9 mm in this sample  Histologic grade of invasive carcinoma: Grade 1                       Glandular/tubular differentiation score: 2                       Nuclear pleomorphism score: 2                       Mitotic rate score: 1                       Total score: 5  Ductal carcinoma in situ: Not identified  Lymphovascular invasion: Not identified   D. LYMPH NODE, RIGHT AXILLA (HYDROMARK CLIP); ULTRASOUND-GUIDED CORE  BIOPSY:  - METASTATIC MAMMARY CARCINOMA, 6 MM IN SIZE IN THIS SAMPLE.  - EXTRANODAL EXTENSION IS PRESENT, LESS THAN 2 MM IN THIS SAMPLE.   Comment:  The three sampled right breast carcinomas are of no special type  (ductal).  The two largest tumors clinically (A and B) are similar in histologic  appearance and are grade 2.  The axillary node metastasis is similar to the tumors in A and B.  The smallest tumor (C) is grade 1, histologically lower grade.   The College of  American Pathologists breast biomarker checklist (06/2020)  recommends that the largest invasive focus be tested for ER, PR, and  HER2 when multiple invasive foci are present.  Testing of a smaller carcinoma is  recommended if it is a different type  or higher grade, which is not applicable in this case.  ER, PR, and HER2 testing is performed at this time on the largest  carcinoma (A, 3 o'clock).   GROSS DESCRIPTION:  A. Labeled: Right breast 3:00 1 cm from nipple  Received: Formalin  Time/date in fixative: Collected and placed in formalin at 9:18 AM on  01/19/2021  Cold ischemic time: Less than 1 minute  Total fixation time: Approximately 11 hours  Core pieces: 3  Size: Range from 1.7-2 cm in length and 0.2 cm in diameter  Description: Received are cores of yellow and white fibrofatty tissue  with areas of hemorrhage.  Ink color: Blue  Entirely submitted in 1 cassette.   B. Labeled: Right 4:00 6 cm nipple  Received: Formalin  Time/date in fixative: Collected and placed in formalin at 9:18 AM on  01/19/2021  Cold ischemic time: Less than 1 minute  Total fixation time: Approximately 11 hours  Core pieces: 2  Size: Range from 1.3-1.7 cm in length and 0.2 cm in diameter  Description: Received are cores of yellow and white fibrofatty tissue.  Ink color: Green  Entirely submitted in 1 cassette.   C. Labeled: Right 5:30 7 cm nipple  Received: Formalin  Time/date in fixative: Collected and placed in formalin at 9:18 AM on  01/19/2021  Cold ischemic time: Less than 1 minute  Total fixation time: Approximately 11 hours  Core pieces: 3  Size: Range from 1.4-1.6 cm in length and 0.2 cm in diameter  Description: Received are cores of tan and white fibrofatty tissue.  Ink color: Black  Entirely submitted in 1 cassette.   D. Labeled: Right axilla  Received: Formalin  Time/date in fixative: Collected and placed in formalin at 9:18 AM on  01/19/2021  Cold ischemic time: Less than 1  minute  Total fixation time: Approximately 11 hours  Core pieces: 2  Size: Range from 1.1-1.9 cm in length and 0.2 cm in diameter  Description: Received are cores of tan and white fibrofatty tissue.  Ink color: Blue  Entirely submitted in 1 cassette.   RB 01/19/2021    Final Diagnosis performed by Bryan Lemma, MD.   Electronically signed  01/23/2021 1:18:51PM  The electronic signature indicates that the named Attending Pathologist  has evaluated the specimen  Technical component performed at [facility], [address], [place],  [place] [address] Lab: [phone] Dir: Rush Farmer, MD, MMM   Professional component performed at [facility], [facility], [place], [place], [place] [address] Lab: [phone]  Dir: Kathi Simpers, MD   ADDENDUM:  CASE SUMMARY: BREAST BIOMARKER TESTS  Estrogen Receptor (ER) Status: POSITIVE          Percentage of cells with nuclear positivity: Greater than 90%          Average intensity of staining: Strong   Progesterone Receptor (PgR) Status: POSITIVE          Percentage of cells with nuclear positivity: Greater than 90%          Average intensity of staining: Strong   HER2 (by immunohistochemistry): NEGATIVE (Score 1+)  Ki-67: Not performed   Cold Ischemia and Fixation Times: Meet requirements specified in latest  version of the ASCO/CAP guidelines  Testing Performed on Block Number(s): A1   METHODS  Fixative: Formalin  Estrogen Receptor:  FDA cleared ([place]) Primary Antibody:  SP1  Progesterone Receptor: FDA cleared ([place]) Primary Antibody: 1E2  HER2 (by  IHC): FDA approved ([place]) Primary Antibody: 4B5 ([facility])  Immunohistochemistry controls worked appropriately. Slides were prepared  by [facility], [place], [place], and interpreted by Dr. Dicie Beam.  (v1.5.0.0)     Addendum #1 performed by Bryan Lemma, MD.   Electronically signed  01/29/2021 3:55:50PM  The electronic signature indicates that the named  Attending Pathologist  has evaluated the specimen  Technical component performed at [facility], [address], [place],  [place] [address] Lab: [phone] Dir: Rush Farmer, MD, MMM   Professional component performed at [facility], [facility], [place], [place], [place] [address] Lab: [phone]  Dir: Kathi Simpers, MD    RADS: CLINICAL DATA:  Patient status post ultrasound-guided core needle  biopsy 3 right breast masses and 1 right axillary lymph node.   EXAM:  3D DIAGNOSTIC RIGHT MAMMOGRAM POST ULTRASOUND BIOPSY   COMPARISON:  Previous exam(s).   FINDINGS:  3D Mammographic images were obtained ultrasound-guided core needle  biopsy of right breast masses and right axillary lymph node.   Site 1: Right breast mass 3 o'clock position: Ribbon shaped clip: In  appropriate position.   Site 2: Right breast mass 4 o'clock position: Venous shaped clip: In  appropriate position.   Site 3: Right breast mass 5:30 o'clock: Heart shaped clip: In  appropriate position.   Site 4: Right axilla: HydroMARK clip: In appropriate position.   IMPRESSION:  Appropriate positioning of the biopsy marking clips as above.   Final Assessment: Post Procedure Mammograms for Marker Placement    CLINICAL DATA:  Patient presents for a bilateral diagnostic exam due  to a palpable abnormality at the skin over the inner right breast as  well as second palpable abnormality over the medial right breast.  Patient has been lost to follow-up since 2014 as patient had not  returned for follow-up of right breast microcalcifications and  asymmetry.   EXAM:  DIGITAL DIAGNOSTIC BILATERAL MAMMOGRAM WITH TOMOSYNTHESIS AND CAD;  ULTRASOUND RIGHT BREAST LIMITED   TECHNIQUE:  Bilateral digital diagnostic mammography and breast tomosynthesis  was performed. The images were evaluated with computer-aided  detection.; Targeted ultrasound examination of the right breast was  performed    COMPARISON:  Previous exam(s).   ACR Breast Density Category c: The breast tissue is heterogeneously  dense, which may obscure small masses.   FINDINGS:  Examination demonstrates an irregular mass which appears be abutting  the skin over the middle third of the inner midportion of the right  breast accounting for 1 of patient's palpable abnormalities. There  is also an irregular mass over the inner lower left breast  accounting for patient's second palpable abnormality. There is a  spiculated mass in the right retroareolar region just medial to the  midline. Possible small additional irregular mass over the middle to  anterior third of the medial right breast. Segmental distribution of  coarse microcalcifications spanning 5 cm are not significantly  changed from 2014 over the outer lower right breast. Left breast is  unchanged.   On physical exam, there is a 1 cm violaceous colored raised skin  lesion over 1 of patient's palpable abnormalities at approximately  the 4 o'clock position of the right breast.   Targeted ultrasound is performed, showing over the right breast:   -irregular hypoechoic mass extending to and involving the skin at  the 4 o'clock position 6 cm from the nipple measuring 1 x 1.1 x 1.4  cm.   -irregular hypoechoic mass at the 5:30 position 7 cm from  the nipple  measuring 0.8 x 0.9 x 1.1 cm.   -irregular hypoechoic mass extending to the skin at the 3 o'clock  position 1 cm from the nipple measuring 1.3 x 1.4 x 2 cm.   -circumscribed lobulated cyst cluster at the 4 o'clock position 4 cm  from the nipple measuring 4 x 4 x 5 mm.   Ultrasound of the right axilla demonstrates approximately 4-5  abnormal lymph nodes with cortical thickening. The largest lymph  node with cortical thickening demonstrates suggestion of  calcifications within the cortex.   IMPRESSION:  Three suspicious masses as described over the inner lower quadrant  of the right breast as  described in addition to abnormal right  axillary lymph nodes.   RECOMMENDATION:  Recommend ultrasound-guided core needle biopsy of the 3 suspicious  masses described at the 4 o'clock position, 5:30 position and 3  o'clock position as well as biopsy of 1 of the abnormal right  axillary lymph nodes.   I have discussed the findings and recommendations with the patient  and her son who was present. If applicable, a reminder letter will  be sent to the patient regarding the next appointment.   BI-RADS CATEGORY  5: Highly suggestive of malignancy.   Biopsy was scheduled here at the [facility].      Assessment:   Breast cancer metastasized to axillary lymph node, left (CMS-HCC) [C50.912, C77.3], Pt declining chemotherapy at this time.     Plan:   1. Breast cancer metastasized to axillary lymph node, left (CMS-HCC) [C50.912, C77.3]  Discussed the risk of surgery including recurrence, chronic pain, post-op infxn, poor/delayed wound healing, poor cosmesis, seroma, hematoma formation, and possible re-operation to address said risks. The risks of general anesthetic, if used, includes MI, CVA, sudden death or even reaction to anesthetic medications also discussed.  Typical post-op recovery time and possbility of activity restrictions were also discussed.  Alternatives include continued observation.  Benefits include possible symptom relief, pathologic evaluation, and/or curative excision.    The patient verbalized understanding and all questions were answered to the patient's satisfaction.   2. Patient has elected to proceed with surgical treatment. Procedure will be scheduled.

## 2021-02-19 NOTE — H&P (Signed)
Subjective:   CC: Breast cancer metastasized to axillary lymph node, left (CMS-HCC) [C50.912, C77.3] HPI:  Elizabeth [name] is a 71 y.o. female who was referred by Glee Arvin* for evaluation of above. Change was noted on with palpable mass by patient.  She otherwise only has complaints of rib pain on the left side. Unsure how long it has been going on.  Denies headache or changes in BM, abdominal pain.     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   Past Surgical History:  has a past surgical history that includes Cesarean section; Abdominal surgery; Tumor removed; Cholecystectomy; endoscopic ultrasound (N/A, 01/28/2014); Hysterectomy (1987); egd (N/A, 12/26/2015); resection small bowel (N/A, 01/09/2016); and gastrojejunostomy (N/A, 01/09/2016).   Family History: family history includes Alzheimer's disease in her mother; Suicidality in her sister.   Social History:  reports that she has never smoked. She has never used smokeless tobacco. She reports that she does not drink alcohol and does not use drugs.   Current Medications: has a current medication list which includes the following prescription(s): levothyroxine, alendronate, atorvastatin, citalopram, clonazepam, ferrous sulfate, mirtazapine, and paroxetine.   Allergies:  Allergies as of 01/31/2021 - Reviewed 01/31/2021 Allergen Reaction Noted  Percocet [oxycodone-acetaminophen] Other (See Comments) 12/21/2013     ROS:  A 15 point review of systems was performed and was negative except as noted in HPI    Objective:   BP 114/69    Pulse 95    Ht 165.1 cm (_0 )    Wt 52.4 kg (115 lb 9.6 oz)    BMI 19.24 kg/m    Constitutional :  No distress, cooperative, alert Lymphatics/Throat:  Supple with no lymphadenopathy Respiratory:  Clear to auscultation bilaterally Cardiovascular:  Regular rate and rhythm Gastrointestinal: Soft, non-tender, non-distended, no organomegaly. Musculoskeletal: Steady gait and movement. TTP along left mid-postieror ribs Skin: Cool and moist, no surgical scars around right breast or axilla Psychiatric: Normal affect, non-agitated, not confused Breast: Normal appearance and no palpable abnormality in left breast, and axilla.  Right breast with recent biopsy changes and visible and palpable firm, large superficial node noted in right axilla.  no additional palpable nodes deeper on palpation. Chaperone present for exam.     LABS:  Reason for Addendum #1:  Breast Biomarker Results   Specimen Submitted:  A. Breast, right, 3:00, ribbon clip; biopsy  B. Breast, right, 4:00 Venus clip; biopsy  C. Breast, right, 5:30 heart clip; biopsy  D. Lymph node, right axilla, hydroMARK clip; biopsy   Clinical History: Three suspicious right breast masses. Two are palpable  and there is involvement of overlying skin. Abnormal right axillary  lymph nodes.  Site 1: 3 o'clock 1 cm from nipple, 2 cm, ribbon clip  Site 2: 4 o'clock 6 cm from nipple, 1.4 cm, Venus clip  Site 3: 5:30 o'clock 7 cm from nipple, 1.1 cm, heart clip  Site 4: axillary lymph node, HydroMARK clip  Post biopsy mammograms show all marker clips in appropriate position.      DIAGNOSIS:  A. BREAST, RIGHT, 3 O'CLOCK (RIBBON CLIP); ULTRASOUND-GUIDED CORE  BIOPSY:  - INVASIVE MAMMARY CARCINOMA, NO SPECIAL TYPE.  Size of invasive carcinoma: 11 mm in this  sample  Histologic grade of invasive carcinoma: Grade 2                       Glandular/tubular differentiation score: 3                       Nuclear  pleomorphism score: 3                       Mitotic rate score: 1                       Total score: 7  Ductal carcinoma in situ: Not identified  Lymphovascular invasion: Not identified  ER/PR/HER2: Immunohistochemistry will be performed on block A1, with  reflex to [place] for HER2 2+. The results will be reported in an addendum.  See comment below.   B. BREAST, RIGHT, 4 O'CLOCK (VENUS CLIP); ULTRASOUND-GUIDED CORE BIOPSY:  - INVASIVE MAMMARY CARCINOMA, NO SPECIAL TYPE.  Size of invasive carcinoma: 10 mm in this sample  Histologic grade of invasive carcinoma: Grade 2                       Glandular/tubular differentiation score: 3                       Nuclear pleomorphism score: 3                       Mitotic rate score: 1                       Total score: 7  Ductal carcinoma in situ: Not identified  Lymphovascular invasion: Not identified   C. BREAST, RIGHT, 5:30 O'CLOCK (HEART CLIP); ULTRASOUND-GUIDED CORE  BIOPSY:  - INVASIVE MAMMARY CARCINOMA, NO SPECIAL TYPE.  Size of invasive carcinoma: 9 mm in this sample  Histologic grade of invasive carcinoma: Grade 1                       Glandular/tubular differentiation score: 2                       Nuclear pleomorphism score: 2                       Mitotic rate score: 1                       Total score: 5  Ductal carcinoma in situ: Not identified  Lymphovascular invasion: Not identified   D. LYMPH NODE, RIGHT AXILLA (HYDROMARK CLIP); ULTRASOUND-GUIDED CORE  BIOPSY:  - METASTATIC MAMMARY CARCINOMA, 6 MM IN SIZE IN THIS SAMPLE.  - EXTRANODAL EXTENSION IS PRESENT, LESS THAN 2 MM IN THIS SAMPLE.   Comment:  The three sampled right breast carcinomas are of no special type  (ductal).  The two largest tumors clinically (A and B) are similar in histologic  appearance and are grade 2.  The axillary node metastasis is similar to the tumors in A and B.  The smallest tumor (C) is grade 1, histologically lower grade.   The College of  American Pathologists breast biomarker checklist (06/2020)  recommends that the largest invasive focus be tested for ER, PR, and  HER2 when multiple invasive foci are present.  Testing of a smaller carcinoma is  recommended if it is a different type  or higher grade, which is not applicable in this case.  ER, PR, and HER2 testing is performed at this time on the largest  carcinoma (A, 3 o'clock).   GROSS DESCRIPTION:  A. Labeled: Right breast 3:00 1 cm from nipple  Received: Formalin  Time/date in fixative: Collected and placed in formalin at 9:18 AM on  01/19/2021  Cold ischemic time: Less than 1 minute  Total fixation time: Approximately 11 hours  Core pieces: 3  Size: Range from 1.7-2 cm in length and 0.2 cm in diameter  Description: Received are cores of yellow and white fibrofatty tissue  with areas of hemorrhage.  Ink color: Blue  Entirely submitted in 1 cassette.   B. Labeled: Right 4:00 6 cm nipple  Received: Formalin  Time/date in fixative: Collected and placed in formalin at 9:18 AM on  01/19/2021  Cold ischemic time: Less than 1 minute  Total fixation time: Approximately 11 hours  Core pieces: 2  Size: Range from 1.3-1.7 cm in length and 0.2 cm in diameter  Description: Received are cores of yellow and white fibrofatty tissue.  Ink color: Green  Entirely submitted in 1 cassette.   C. Labeled: Right 5:30 7 cm nipple  Received: Formalin  Time/date in fixative: Collected and placed in formalin at 9:18 AM on  01/19/2021  Cold ischemic time: Less than 1 minute  Total fixation time: Approximately 11 hours  Core pieces: 3  Size: Range from 1.4-1.6 cm in length and 0.2 cm in diameter  Description: Received are cores of tan and white fibrofatty tissue.  Ink color: Black  Entirely submitted in 1 cassette.   D. Labeled: Right axilla  Received: Formalin  Time/date in fixative: Collected and placed in formalin at 9:18 AM on  01/19/2021  Cold ischemic time: Less than 1  minute  Total fixation time: Approximately 11 hours  Core pieces: 2  Size: Range from 1.1-1.9 cm in length and 0.2 cm in diameter  Description: Received are cores of tan and white fibrofatty tissue.  Ink color: Blue  Entirely submitted in 1 cassette.   RB 01/19/2021    Final Diagnosis performed by Bryan Lemma, MD.   Electronically signed  01/23/2021 1:18:51PM  The electronic signature indicates that the named Attending Pathologist  has evaluated the specimen  Technical component performed at [facility], [address], [place],  [place] [address] Lab: [phone] Dir: Rush Farmer, MD, MMM   Professional component performed at [facility], [facility], [place], [place], [place] [address] Lab: [phone]  Dir: Kathi Simpers, MD   ADDENDUM:  CASE SUMMARY: BREAST BIOMARKER TESTS  Estrogen Receptor (ER) Status: POSITIVE          Percentage of cells with nuclear positivity: Greater than 90%          Average intensity of staining: Strong   Progesterone Receptor (PgR) Status: POSITIVE          Percentage of cells with nuclear positivity: Greater than 90%          Average intensity of staining: Strong   HER2 (by immunohistochemistry): NEGATIVE (Score 1+)  Ki-67: Not performed   Cold Ischemia and Fixation Times: Meet requirements specified in latest  version of the ASCO/CAP guidelines  Testing Performed on Block Number(s): A1   METHODS  Fixative: Formalin  Estrogen Receptor:  FDA cleared ([place]) Primary Antibody:  SP1  Progesterone Receptor: FDA cleared ([place]) Primary Antibody: 1E2  HER2 (by  IHC): FDA approved ([place]) Primary Antibody: 4B5 ([facility])  Immunohistochemistry controls worked appropriately. Slides were prepared  by [facility], [place], [place], and interpreted by Dr. Dicie Beam.  (v1.5.0.0)     Addendum #1 performed by Bryan Lemma, MD.   Electronically signed  01/29/2021 3:55:50PM  The electronic signature indicates that the named  Attending Pathologist  has evaluated the specimen  Technical component performed at [place], [address], [place],  [place] [address] Lab: [phone] Dir: Rush Farmer, MD, MMM   Professional component performed at [facility], [facility], [place], [place], [place] [address] Lab: [phone]  Dir: Kathi Simpers, MD    RADS: CLINICAL DATA:  Patient status post ultrasound-guided core needle  biopsy 3 right breast masses and 1 right axillary lymph node.   EXAM:  3D DIAGNOSTIC RIGHT MAMMOGRAM POST ULTRASOUND BIOPSY   COMPARISON:  Previous exam(s).   FINDINGS:  3D Mammographic images were obtained ultrasound-guided core needle  biopsy of right breast masses and right axillary lymph node.   Site 1: Right breast mass 3 o'clock position: Ribbon shaped clip: In  appropriate position.   Site 2: Right breast mass 4 o'clock position: Venous shaped clip: In  appropriate position.   Site 3: Right breast mass 5:30 o'clock: Heart shaped clip: In  appropriate position.   Site 4: Right axilla: HydroMARK clip: In appropriate position.   IMPRESSION:  Appropriate positioning of the biopsy marking clips as above.   Final Assessment: Post Procedure Mammograms for Marker Placement    CLINICAL DATA:  Patient presents for a bilateral diagnostic exam due  to a palpable abnormality at the skin over the inner right breast as  well as second palpable abnormality over the medial right breast.  Patient has been lost to follow-up since 2014 as patient had not  returned for follow-up of right breast microcalcifications and  asymmetry.   EXAM:  DIGITAL DIAGNOSTIC BILATERAL MAMMOGRAM WITH TOMOSYNTHESIS AND CAD;  ULTRASOUND RIGHT BREAST LIMITED   TECHNIQUE:  Bilateral digital diagnostic mammography and breast tomosynthesis  was performed. The images were evaluated with computer-aided  detection.; Targeted ultrasound examination of the right breast was  performed    COMPARISON:  Previous exam(s).   ACR Breast Density Category c: The breast tissue is heterogeneously  dense, which may obscure small masses.   FINDINGS:  Examination demonstrates an irregular mass which appears be abutting  the skin over the middle third of the inner midportion of the right  breast accounting for 1 of patient's palpable abnormalities. There  is also an irregular mass over the inner lower left breast  accounting for patient's second palpable abnormality. There is a  spiculated mass in the right retroareolar region just medial to the  midline. Possible small additional irregular mass over the middle to  anterior third of the medial right breast. Segmental distribution of  coarse microcalcifications spanning 5 cm are not significantly  changed from 2014 over the outer lower right breast. Left breast is  unchanged.   On physical exam, there is a 1 cm violaceous colored raised skin  lesion over 1 of patient's palpable abnormalities at approximately  the 4 o'clock position of the right breast.   Targeted ultrasound is performed, showing over the right breast:   -irregular hypoechoic mass extending to and involving the skin at  the 4 o'clock position 6 cm from the nipple measuring 1 x 1.1 x 1.4  cm.   -irregular hypoechoic mass at the 5:30 position 7 cm from  the nipple  measuring 0.8 x 0.9 x 1.1 cm.   -irregular hypoechoic mass extending to the skin at the 3 o'clock  position 1 cm from the nipple measuring 1.3 x 1.4 x 2 cm.   -circumscribed lobulated cyst cluster at the 4 o'clock position 4 cm  from the nipple measuring 4 x 4 x 5 mm.   Ultrasound of the right axilla demonstrates approximately 4-5  abnormal lymph nodes with cortical thickening. The largest lymph  node with cortical thickening demonstrates suggestion of  calcifications within the cortex.   IMPRESSION:  Three suspicious masses as described over the inner lower quadrant  of the right breast as  described in addition to abnormal right  axillary lymph nodes.   RECOMMENDATION:  Recommend ultrasound-guided core needle biopsy of the 3 suspicious  masses described at the 4 o'clock position, 5:30 position and 3  o'clock position as well as biopsy of 1 of the abnormal right  axillary lymph nodes.   I have discussed the findings and recommendations with the patient  and her son who was present. If applicable, a reminder letter will  be sent to the patient regarding the next appointment.   BI-RADS CATEGORY  5: Highly suggestive of malignancy.   Biopsy was scheduled here at the [facility].      Assessment:   Breast cancer metastasized to axillary lymph node, left (CMS-HCC) [C50.912, C77.3], Pt declining chemotherapy at this time.     Plan:   1. Breast cancer metastasized to axillary lymph node, left (CMS-HCC) [C50.912, C77.3]  Discussed the risk of surgery including recurrence, chronic pain, post-op infxn, poor/delayed wound healing, poor cosmesis, seroma, hematoma formation, and possible re-operation to address said risks. The risks of general anesthetic, if used, includes MI, CVA, sudden death or even reaction to anesthetic medications also discussed.  Typical post-op recovery time and possbility of activity restrictions were also discussed.  Alternatives include continued observation.  Benefits include possible symptom relief, pathologic evaluation, and/or curative excision.    The patient verbalized understanding and all questions were answered to the patient's satisfaction.   2. Patient has elected to proceed with surgical treatment. Procedure will be scheduled.

## 2021-02-20 ENCOUNTER — Other Ambulatory Visit: Payer: Medicare HMO

## 2021-02-21 ENCOUNTER — Other Ambulatory Visit: Payer: Self-pay

## 2021-02-21 ENCOUNTER — Inpatient Hospital Stay: Payer: Medicare HMO | Attending: Oncology | Admitting: Hospice and Palliative Medicine

## 2021-02-21 DIAGNOSIS — Z90710 Acquired absence of both cervix and uterus: Secondary | ICD-10-CM | POA: Insufficient documentation

## 2021-02-21 DIAGNOSIS — C50311 Malignant neoplasm of lower-inner quadrant of right female breast: Secondary | ICD-10-CM | POA: Insufficient documentation

## 2021-02-21 DIAGNOSIS — G8918 Other acute postprocedural pain: Secondary | ICD-10-CM | POA: Insufficient documentation

## 2021-02-21 DIAGNOSIS — Z803 Family history of malignant neoplasm of breast: Secondary | ICD-10-CM | POA: Insufficient documentation

## 2021-02-21 DIAGNOSIS — Z170 Estrogen receptor positive status [ER+]: Secondary | ICD-10-CM | POA: Insufficient documentation

## 2021-02-21 DIAGNOSIS — C50911 Malignant neoplasm of unspecified site of right female breast: Secondary | ICD-10-CM

## 2021-02-21 DIAGNOSIS — I890 Lymphedema, not elsewhere classified: Secondary | ICD-10-CM | POA: Insufficient documentation

## 2021-02-21 DIAGNOSIS — C773 Secondary and unspecified malignant neoplasm of axilla and upper limb lymph nodes: Secondary | ICD-10-CM | POA: Insufficient documentation

## 2021-02-21 NOTE — Progress Notes (Addendum)
Multidisciplinary Oncology Council Documentation  Elizabeth [name] was presented by our Elizabeth [name] on 02/21/2021, which included representatives from:  Palliative Care Dietitian  Physical/Occupational Therapist Nurse Navigator Genetics Speech Therapist Social work Survivorship RN Financial Navigator Research RN   Elizabeth [name] currently presents with history of breast cancer  We reviewed previous medical and familial history, history of present illness, and recent lab results along with all available histopathologic and imaging studies. [facility] considered available treatment options and made the following recommendations/referrals:  Rehab screening, LCSW, genetics   The MOC is a meeting of clinicians from various specialty areas who evaluate and discuss patients for whom a multidisciplinary approach is being considered. Final determinations in the plan of care are those of the provider(s).   Today's extended care, comprehensive team conference, Elizabeth [name] was not present for the discussion and was not examined.

## 2021-02-22 ENCOUNTER — Telehealth: Payer: Self-pay | Admitting: *Deleted

## 2021-02-22 NOTE — Telephone Encounter (Signed)
Left vm for patient to return my phone call.  Need to set up OT screening referral apt with [facility]

## 2021-02-22 NOTE — Addendum Note (Signed)
Addended by: Irean Hong on: 02/22/2021 02:32 PM   Modules accepted: Orders

## 2021-02-23 ENCOUNTER — Other Ambulatory Visit: Payer: Self-pay

## 2021-02-23 ENCOUNTER — Encounter
Admission: RE | Admit: 2021-02-23 | Discharge: 2021-02-23 | Disposition: A | Discharge status: Home / Self Care | Payer: Medicare HMO | Source: Ambulatory Visit | Attending: Surgery | Admitting: Surgery

## 2021-02-23 VITALS — Ht 61.0 in | Wt 117.0 lb

## 2021-02-23 DIAGNOSIS — R739 Hyperglycemia, unspecified: Secondary | ICD-10-CM

## 2021-02-23 DIAGNOSIS — R0600 Dyspnea, unspecified: Secondary | ICD-10-CM

## 2021-02-23 DIAGNOSIS — I10 Essential (primary) hypertension: Secondary | ICD-10-CM

## 2021-02-23 DIAGNOSIS — E785 Hyperlipidemia, unspecified: Secondary | ICD-10-CM

## 2021-02-23 DIAGNOSIS — E876 Hypokalemia: Secondary | ICD-10-CM

## 2021-02-23 NOTE — Patient Instructions (Signed)
Your procedure is scheduled on: 03/02/21 Report to [facility]. To find out your arrival time please call [phone] between 1PM - 3PM on 03/01/21.  Remember: Instructions that are not followed completely may result in serious medical risk, up to and including death, or upon the discretion of your surgeon and anesthesiologist your surgery may need to be rescheduled.     _X__ 1. Do not eat food after midnight the night before your procedure.                 No gum chewing or hard candies. You may drink clear liquids up to 2 hours                 before you are scheduled to arrive for your surgery- DO not drink clear                 liquids within 2 hours of the start of your surgery.                 Clear Liquids include:  water, apple juice without pulp, clear carbohydrate                 drink such as Clearfast or Gatorade, Black Coffee or Tea (Do not add                 anything to coffee or tea). Diabetics water only  __X__2.  On the morning of surgery brush your teeth with toothpaste and water, you                 may rinse your mouth with mouthwash if you wish.  Do not swallow any              toothpaste of mouthwash.     _X__ 3.  No Alcohol for 24 hours before or after surgery.   _X__ 4.  Do Not Smoke or use e-cigarettes For 24 Hours Prior to Your Surgery.                 Do not use any chewable tobacco products for at least 6 hours prior to                 surgery.  ____  5.  Bring all medications with you on the day of surgery if instructed.   __X__  6.  Notify your doctor if there is any change in your medical condition      (cold, fever, infections).     Do not wear jewelry, make-up, hairpins, clips or nail polish. Do not wear lotions, powders, or perfumes.  Do not shave 48 hours prior to surgery. Men may shave face and neck. Do not bring valuables to the hospital.    [facility] is not responsible for any belongings or  valuables.  Contacts, dentures/partials or body piercings may not be worn into surgery. Bring a case for your contacts, glasses or hearing aids, a denture cup will be supplied. Leave your suitcase in the car. After surgery it may be brought to your room. For patients admitted to the hospital, discharge time is determined by your treatment team.   Patients discharged the day of surgery will not be allowed to drive home.   Please read over the following fact sheets that you were given:   CHG soap  __X__ Take these medicines the morning of surgery with A SIP OF WATER:  1. atorvastatin (LIPITOR) 20 MG tablet  2. levothyroxine (SYNTHROID, LEVOTHROID) 88 MCG tablet  3. clonazePAM (KLONOPIN) 1 MG tablet if needed  4.  5.  6.  ____ Fleet Enema (as directed)   __X__ Use CHG Soap/SAGE wipes as directed  ____ Use inhalers on the day of surgery  ____ Stop metformin/Janumet/Farxiga 2 days prior to surgery    ____ Take 1/2 of usual insulin dose the night before surgery. No insulin the morning          of surgery.   ____ Stop Blood Thinners Coumadin/Plavix/Xarelto/Pleta/Pradaxa/Eliquis/Effient/Aspirin  on   Or contact your Surgeon, Cardiologist or Medical Doctor regarding  ability to stop your blood thinners  __X__ Stop Anti-inflammatories 7 days before surgery such as Advil, Ibuprofen, Motrin,  BC or Goodies Powder, Naprosyn, Naproxen, Aleve, Aspirin    __X__ Stop all herbal supplements, fish oil or vitamin E until after surgery.    ____ Bring C-Pap to the hospital.

## 2021-02-26 ENCOUNTER — Encounter: Payer: Self-pay | Admitting: Urgent Care

## 2021-02-26 ENCOUNTER — Encounter: Payer: Self-pay | Admitting: *Deleted

## 2021-02-26 ENCOUNTER — Other Ambulatory Visit: Payer: Self-pay

## 2021-02-26 ENCOUNTER — Other Ambulatory Visit
Admission: RE | Admit: 2021-02-26 | Discharge: 2021-02-26 | Disposition: A | Discharge status: Home / Self Care | Payer: Medicare HMO | Source: Ambulatory Visit | Attending: Surgery | Admitting: Surgery

## 2021-02-26 DIAGNOSIS — E785 Hyperlipidemia, unspecified: Secondary | ICD-10-CM | POA: Insufficient documentation

## 2021-02-26 DIAGNOSIS — Z01818 Encounter for other preprocedural examination: Secondary | ICD-10-CM | POA: Diagnosis not present

## 2021-02-26 DIAGNOSIS — E876 Hypokalemia: Secondary | ICD-10-CM | POA: Diagnosis not present

## 2021-02-26 DIAGNOSIS — R0600 Dyspnea, unspecified: Secondary | ICD-10-CM | POA: Diagnosis not present

## 2021-02-26 DIAGNOSIS — R739 Hyperglycemia, unspecified: Secondary | ICD-10-CM | POA: Insufficient documentation

## 2021-02-26 DIAGNOSIS — I10 Essential (primary) hypertension: Secondary | ICD-10-CM | POA: Diagnosis not present

## 2021-02-26 LAB — BASIC METABOLIC PANEL
Anion gap: 7 (ref 5–15)
BUN: 33 mg/dL — ABNORMAL HIGH (ref 8–23)
CO2: 25 mmol/L (ref 22–32)
Calcium: 8.5 mg/dL — ABNORMAL LOW (ref 8.9–10.3)
Chloride: 105 mmol/L (ref 98–111)
Creatinine, Ser: 0.84 mg/dL (ref 0.44–1.00)
GFR, Estimated: >60 mL/min (ref >60)
Glucose, Bld: 95 mg/dL (ref 70–99)
Potassium: 3.8 mmol/L (ref 3.5–5.1)
Sodium: 137 mmol/L (ref 135–145)

## 2021-02-26 LAB — CBC
HCT: 29.7 % — ABNORMAL LOW (ref 36.0–46.0)
Hemoglobin: 9.6 g/dL — ABNORMAL LOW (ref 12.0–15.0)
MCH: 28 pg (ref 26.0–34.0)
MCHC: 32.3 g/dL (ref 30.0–36.0)
MCV: 86.6 fL (ref 80.0–100.0)
Platelets: 380 10*3/uL (ref 150–400)
RBC: 3.43 MIL/uL — ABNORMAL LOW (ref 3.87–5.11)
RDW: 14.6 % (ref 11.5–15.5)
WBC: 7.6 10*3/uL (ref 4.0–10.5)
nRBC: 0 % (ref 0.0–0.2)

## 2021-02-26 NOTE — Progress Notes (Addendum)
Patient's son Elizabeth [name] called.  Patient is scheduled for mastectomy on 03/02/21.  She is scheduled for post up follow up with Dr. Lysle Pearl on 03/14/21 @ 9:00.  I asked Elizabeth [name] about follow up with medical oncology.  He is agreeable.  Will try and schedule follow up with Dr. Grayland Ormond the same day.  Message sent to his team.  Informed patient's son Elizabeth [name] that patient is scheduled for follow up with Dr. Grayland Ormond on 03/14/21 @ 11:00.

## 2021-03-02 ENCOUNTER — Ambulatory Visit: Payer: Medicare HMO

## 2021-03-02 ENCOUNTER — Encounter: Payer: Self-pay | Admitting: Surgery

## 2021-03-02 ENCOUNTER — Encounter
Admission: AD | Disposition: A | Discharge status: Home Health | Payer: Self-pay | Source: Home / Self Care | Attending: Surgery

## 2021-03-02 ENCOUNTER — Other Ambulatory Visit: Payer: Self-pay

## 2021-03-02 ENCOUNTER — Ambulatory Visit
Admission: RE | Admit: 2021-03-02 | Discharge: 2021-03-02 | Disposition: A | Discharge status: Home / Self Care | Payer: Medicare HMO | Source: Home / Self Care | Attending: Surgery | Admitting: Surgery

## 2021-03-02 ENCOUNTER — Inpatient Hospital Stay
Admission: AD | Admit: 2021-03-02 | Discharge: 2021-03-05 | DRG: 582 | Disposition: A | Discharge status: Home Health | Payer: Medicare HMO | Attending: Surgery | Admitting: Surgery

## 2021-03-02 DIAGNOSIS — F4323 Adjustment disorder with mixed anxiety and depressed mood: Secondary | ICD-10-CM | POA: Diagnosis present

## 2021-03-02 DIAGNOSIS — F3181 Bipolar II disorder: Secondary | ICD-10-CM | POA: Diagnosis present

## 2021-03-02 DIAGNOSIS — Z880 Allergy status to penicillin: Secondary | ICD-10-CM

## 2021-03-02 DIAGNOSIS — Z8249 Family history of ischemic heart disease and other diseases of the circulatory system: Secondary | ICD-10-CM

## 2021-03-02 DIAGNOSIS — L989 Disorder of the skin and subcutaneous tissue, unspecified: Secondary | ICD-10-CM | POA: Diagnosis present

## 2021-03-02 DIAGNOSIS — Z20822 Contact with and (suspected) exposure to covid-19: Secondary | ICD-10-CM | POA: Diagnosis present

## 2021-03-02 DIAGNOSIS — T8111XA Postprocedural  cardiogenic shock, initial encounter: Secondary | ICD-10-CM | POA: Diagnosis not present

## 2021-03-02 DIAGNOSIS — K219 Gastro-esophageal reflux disease without esophagitis: Secondary | ICD-10-CM | POA: Diagnosis present

## 2021-03-02 DIAGNOSIS — F410 Panic disorder [episodic paroxysmal anxiety] without agoraphobia: Secondary | ICD-10-CM | POA: Diagnosis present

## 2021-03-02 DIAGNOSIS — E039 Hypothyroidism, unspecified: Secondary | ICD-10-CM | POA: Diagnosis present

## 2021-03-02 DIAGNOSIS — M797 Fibromyalgia: Secondary | ICD-10-CM | POA: Diagnosis present

## 2021-03-02 DIAGNOSIS — Y92239 Unspecified place in hospital as the place of occurrence of the external cause: Secondary | ICD-10-CM | POA: Diagnosis not present

## 2021-03-02 DIAGNOSIS — E785 Hyperlipidemia, unspecified: Secondary | ICD-10-CM | POA: Diagnosis present

## 2021-03-02 DIAGNOSIS — E861 Hypovolemia: Secondary | ICD-10-CM | POA: Diagnosis present

## 2021-03-02 DIAGNOSIS — Y838 Other surgical procedures as the cause of abnormal reaction of the patient, or of later complication, without mention of misadventure at the time of the procedure: Secondary | ICD-10-CM | POA: Diagnosis not present

## 2021-03-02 DIAGNOSIS — Z79890 Hormone replacement therapy (postmenopausal): Secondary | ICD-10-CM

## 2021-03-02 DIAGNOSIS — Z79899 Other long term (current) drug therapy: Secondary | ICD-10-CM

## 2021-03-02 DIAGNOSIS — F209 Schizophrenia, unspecified: Secondary | ICD-10-CM | POA: Diagnosis present

## 2021-03-02 DIAGNOSIS — Z820 Family history of epilepsy and other diseases of the nervous system: Secondary | ICD-10-CM

## 2021-03-02 DIAGNOSIS — C50919 Malignant neoplasm of unspecified site of unspecified female breast: Secondary | ICD-10-CM | POA: Diagnosis present

## 2021-03-02 DIAGNOSIS — Z87891 Personal history of nicotine dependence: Secondary | ICD-10-CM | POA: Diagnosis not present

## 2021-03-02 DIAGNOSIS — I10 Essential (primary) hypertension: Secondary | ICD-10-CM | POA: Diagnosis present

## 2021-03-02 DIAGNOSIS — D649 Anemia, unspecified: Secondary | ICD-10-CM | POA: Diagnosis present

## 2021-03-02 DIAGNOSIS — Z803 Family history of malignant neoplasm of breast: Secondary | ICD-10-CM | POA: Diagnosis not present

## 2021-03-02 DIAGNOSIS — I9581 Postprocedural hypotension: Secondary | ICD-10-CM | POA: Diagnosis not present

## 2021-03-02 DIAGNOSIS — C773 Secondary and unspecified malignant neoplasm of axilla and upper limb lymph nodes: Secondary | ICD-10-CM | POA: Diagnosis present

## 2021-03-02 DIAGNOSIS — C50911 Malignant neoplasm of unspecified site of right female breast: Principal | ICD-10-CM | POA: Diagnosis present

## 2021-03-02 DIAGNOSIS — Z09 Encounter for follow-up examination after completed treatment for conditions other than malignant neoplasm: Secondary | ICD-10-CM

## 2021-03-02 DIAGNOSIS — Z825 Family history of asthma and other chronic lower respiratory diseases: Secondary | ICD-10-CM

## 2021-03-02 DIAGNOSIS — R579 Shock, unspecified: Secondary | ICD-10-CM

## 2021-03-02 DIAGNOSIS — D493 Neoplasm of unspecified behavior of breast: Secondary | ICD-10-CM

## 2021-03-02 DIAGNOSIS — E119 Type 2 diabetes mellitus without complications: Secondary | ICD-10-CM | POA: Diagnosis present

## 2021-03-02 HISTORY — PX: MASTECTOMY MODIFIED RADICAL: SHX5962

## 2021-03-02 LAB — CBC
HCT: 27.4 % — ABNORMAL LOW (ref 36.0–46.0)
Hemoglobin: 8.3 g/dL — ABNORMAL LOW (ref 12.0–15.0)
MCH: 28.3 pg (ref 26.0–34.0)
MCHC: 30.3 g/dL (ref 30.0–36.0)
MCV: 93.5 fL (ref 80.0–100.0)
Platelets: 305 10*3/uL (ref 150–400)
RBC: 2.93 MIL/uL — ABNORMAL LOW (ref 3.87–5.11)
RDW: 14.7 % (ref 11.5–15.5)
WBC: 14 10*3/uL — ABNORMAL HIGH (ref 4.0–10.5)
nRBC: 0 % (ref 0.0–0.2)

## 2021-03-02 LAB — GLUCOSE, CAPILLARY: Glucose-Capillary: 128 mg/dL — ABNORMAL HIGH (ref 70–99)

## 2021-03-02 LAB — SARS CORONAVIRUS 2 BY RT PCR (HOSPITAL ORDER, PERFORMED IN ~~LOC~~ HOSPITAL LAB): SARS Coronavirus 2: NEGATIVE

## 2021-03-02 LAB — MRSA NEXT GEN BY PCR, NASAL: MRSA by PCR Next Gen: NOT DETECTED

## 2021-03-02 LAB — CREATININE, SERUM
Creatinine, Ser: 0.8 mg/dL (ref 0.44–1.00)
GFR, Estimated: >60 mL/min (ref >60)

## 2021-03-02 SURGERY — MASTECTOMY, MODIFIED RADICAL
Anesthesia: General | Site: Axilla | Laterality: Right

## 2021-03-02 MED ORDER — LIDOCAINE HCL (PF) 2 % IJ SOLN
INTRAMUSCULAR | Status: AC
  Filled 2021-03-02: qty 5

## 2021-03-02 MED ORDER — CHLORHEXIDINE GLUCONATE 0.12 % MT SOLN
OROMUCOSAL | Status: AC
  Filled 2021-03-02: qty 15

## 2021-03-02 MED ORDER — SODIUM CHLORIDE 0.9 % IV BOLUS
500.0000 mL | Freq: Once | INTRAVENOUS | Status: AC
Start: 2021-03-02 — End: 2021-03-03
  Administered 2021-03-02: 500 mL via INTRAVENOUS

## 2021-03-02 MED ORDER — CHLORHEXIDINE GLUCONATE 0.12 % MT SOLN: 15.0000 mL | Freq: Once | OROMUCOSAL | Status: AC

## 2021-03-02 MED ORDER — CELECOXIB 200 MG PO CAPS
200.0000 mg | ORAL_CAPSULE | Freq: Two times a day (BID) | ORAL | Status: DC
  Administered 2021-03-02 – 2021-03-05 (×5): 200 mg via ORAL
  Filled 2021-03-02 (×8): qty 1

## 2021-03-02 MED ORDER — CHLORHEXIDINE GLUCONATE CLOTH 2 % EX PADS
6.0000 | MEDICATED_PAD | Freq: Once | CUTANEOUS | Status: AC
  Administered 2021-03-02: 6 via TOPICAL

## 2021-03-02 MED ORDER — ENOXAPARIN SODIUM 40 MG/0.4ML IJ SOSY
40.0000 mg | PREFILLED_SYRINGE | INTRAMUSCULAR | Status: DC
  Administered 2021-03-03 – 2021-03-04 (×2): 40 mg via SUBCUTANEOUS
  Filled 2021-03-02 (×2): qty 0.4

## 2021-03-02 MED ORDER — MIDAZOLAM HCL 2 MG/2ML IJ SOLN
INTRAMUSCULAR | Status: AC
  Filled 2021-03-02: qty 2

## 2021-03-02 MED ORDER — GLYCOPYRROLATE 0.2 MG/ML IJ SOLN
INTRAMUSCULAR | Status: AC
  Filled 2021-03-02: qty 1

## 2021-03-02 MED ORDER — LIDOCAINE HCL (CARDIAC) PF 100 MG/5ML IV SOSY
PREFILLED_SYRINGE | INTRAVENOUS | Status: DC | PRN
  Administered 2021-03-02: 50 mg via INTRAVENOUS

## 2021-03-02 MED ORDER — CEFAZOLIN SODIUM-DEXTROSE 2-4 GM/100ML-% IV SOLN
2.0000 g | INTRAVENOUS | Status: AC
  Administered 2021-03-02: 2 g via INTRAVENOUS

## 2021-03-02 MED ORDER — ACETAMINOPHEN 500 MG PO TABS
ORAL_TABLET | ORAL | Status: AC
  Administered 2021-03-02: 1000 mg via ORAL
  Filled 2021-03-02: qty 2

## 2021-03-02 MED ORDER — PHENYLEPHRINE HCL-NACL 20-0.9 MG/250ML-% IV SOLN
25.0000 ug/min | INTRAVENOUS | Status: DC
  Administered 2021-03-02: 20 ug/min via INTRAVENOUS
  Filled 2021-03-02: qty 250

## 2021-03-02 MED ORDER — STERILE WATER FOR IRRIGATION IR SOLN
Status: DC | PRN
  Administered 2021-03-02: 500 mL

## 2021-03-02 MED ORDER — GABAPENTIN 300 MG PO CAPS
ORAL_CAPSULE | ORAL | Status: AC
  Filled 2021-03-02: qty 1

## 2021-03-02 MED ORDER — BUPIVACAINE LIPOSOME 1.3 % IJ SUSP
INTRAMUSCULAR | Status: DC | PRN
Start: 2021-03-02 — End: 2021-03-02
  Administered 2021-03-02: 20 mL

## 2021-03-02 MED ORDER — PANTOPRAZOLE SODIUM 40 MG IV SOLR
40.0000 mg | Freq: Every day | INTRAVENOUS | Status: DC
  Administered 2021-03-02 – 2021-03-04 (×3): 40 mg via INTRAVENOUS
  Filled 2021-03-02 (×3): qty 10

## 2021-03-02 MED ORDER — ACETAMINOPHEN 500 MG PO TABS: 1000.0000 mg | ORAL_TABLET | ORAL | Status: AC

## 2021-03-02 MED ORDER — DEXAMETHASONE SODIUM PHOSPHATE 10 MG/ML IJ SOLN
INTRAMUSCULAR | Status: AC
  Filled 2021-03-02: qty 1

## 2021-03-02 MED ORDER — LEVOTHYROXINE SODIUM 88 MCG PO TABS
88.0000 ug | ORAL_TABLET | Freq: Every day | ORAL | Status: DC
  Administered 2021-03-03 – 2021-03-04 (×2): 88 ug via ORAL
  Filled 2021-03-02 (×3): qty 1

## 2021-03-02 MED ORDER — CLONAZEPAM 1 MG PO TABS
1.0000 mg | ORAL_TABLET | Freq: Two times a day (BID) | ORAL | Status: DC | PRN
  Administered 2021-03-02 – 2021-03-05 (×2): 1 mg via ORAL
  Filled 2021-03-02 (×4): qty 1

## 2021-03-02 MED ORDER — FENTANYL CITRATE (PF) 100 MCG/2ML IJ SOLN
INTRAMUSCULAR | Status: DC | PRN
  Administered 2021-03-02: 100 ug via INTRAVENOUS

## 2021-03-02 MED ORDER — SODIUM CHLORIDE (PF) 0.9 % IJ SOLN
INTRAMUSCULAR | Status: AC
  Filled 2021-03-02: qty 50

## 2021-03-02 MED ORDER — ALENDRONATE SODIUM 70 MG PO TABS: 70.0000 mg | ORAL_TABLET | ORAL | Status: DC

## 2021-03-02 MED ORDER — BSS IO SOLN
15.0000 mL | Freq: Once | INTRAOCULAR | Status: DC
  Filled 2021-03-02: qty 15

## 2021-03-02 MED ORDER — ACETAMINOPHEN 500 MG PO TABS
ORAL_TABLET | ORAL | Status: AC
  Filled 2021-03-02: qty 2

## 2021-03-02 MED ORDER — PROMETHAZINE HCL 25 MG/ML IJ SOLN: 6.2500 mg | INTRAMUSCULAR | Status: DC | PRN

## 2021-03-02 MED ORDER — PHENYLEPHRINE HCL-NACL 20-0.9 MG/250ML-% IV SOLN
INTRAVENOUS | Status: DC | PRN
Start: 2021-03-02 — End: 2021-03-02
  Administered 2021-03-02: 40 ug/min via INTRAVENOUS

## 2021-03-02 MED ORDER — BUPIVACAINE-EPINEPHRINE (PF) 0.5% -1:200000 IJ SOLN
INTRAMUSCULAR | Status: AC
  Filled 2021-03-02: qty 30

## 2021-03-02 MED ORDER — HYDROMORPHONE HCL 1 MG/ML IJ SOLN
INTRAMUSCULAR | Status: DC | PRN
Start: 2021-03-02 — End: 2021-03-02
  Administered 2021-03-02 (×2): .5 mg via INTRAVENOUS

## 2021-03-02 MED ORDER — ONDANSETRON 4 MG PO TBDP
4.0000 mg | ORAL_TABLET | Freq: Four times a day (QID) | ORAL | Status: DC | PRN
  Filled 2021-03-02: qty 1

## 2021-03-02 MED ORDER — CEFAZOLIN SODIUM-DEXTROSE 2-4 GM/100ML-% IV SOLN
INTRAVENOUS | Status: AC
  Filled 2021-03-02: qty 100

## 2021-03-02 MED ORDER — GABAPENTIN 300 MG PO CAPS: 300.0000 mg | ORAL_CAPSULE | ORAL | Status: AC

## 2021-03-02 MED ORDER — OXYCODONE HCL 5 MG PO TABS
ORAL_TABLET | ORAL | Status: AC
  Administered 2021-03-02: 5 mg via ORAL
  Filled 2021-03-02: qty 1

## 2021-03-02 MED ORDER — HYDROMORPHONE HCL 1 MG/ML IJ SOLN: 0.5000 mg | INTRAMUSCULAR | Status: DC | PRN

## 2021-03-02 MED ORDER — GLYCOPYRROLATE 0.2 MG/ML IJ SOLN
INTRAMUSCULAR | Status: DC | PRN
  Administered 2021-03-02 (×2): .1 mg via INTRAVENOUS

## 2021-03-02 MED ORDER — SODIUM CHLORIDE 0.9 % IV SOLN
25.0000 ug/min | INTRAVENOUS | Status: DC
  Filled 2021-03-02: qty 2

## 2021-03-02 MED ORDER — TRAMADOL HCL 50 MG PO TABS: 50.0000 mg | ORAL_TABLET | Freq: Four times a day (QID) | ORAL | Status: DC | PRN

## 2021-03-02 MED ORDER — GABAPENTIN 300 MG PO CAPS
ORAL_CAPSULE | ORAL | Status: AC
  Administered 2021-03-02: 300 mg via ORAL
  Filled 2021-03-02: qty 1

## 2021-03-02 MED ORDER — FAMOTIDINE 20 MG PO TABS
ORAL_TABLET | ORAL | Status: AC
  Filled 2021-03-02: qty 1

## 2021-03-02 MED ORDER — DEXAMETHASONE SODIUM PHOSPHATE 10 MG/ML IJ SOLN
INTRAMUSCULAR | Status: DC | PRN
  Administered 2021-03-02: 8 mg via INTRAVENOUS

## 2021-03-02 MED ORDER — ATORVASTATIN CALCIUM 20 MG PO TABS
20.0000 mg | ORAL_TABLET | Freq: Every day | ORAL | Status: DC
  Administered 2021-03-03 – 2021-03-05 (×3): 20 mg via ORAL
  Filled 2021-03-02 (×3): qty 1

## 2021-03-02 MED ORDER — SODIUM CHLORIDE (PF) 0.9 % IJ SOLN
INTRAMUSCULAR | Status: DC | PRN
  Administered 2021-03-02: 50 mL

## 2021-03-02 MED ORDER — CHLORHEXIDINE GLUCONATE CLOTH 2 % EX PADS
6.0000 | MEDICATED_PAD | Freq: Every day | CUTANEOUS | Status: DC
  Administered 2021-03-04 – 2021-03-05 (×2): 6 via TOPICAL

## 2021-03-02 MED ORDER — IBUPROFEN 400 MG PO TABS: 600.0000 mg | ORAL_TABLET | Freq: Four times a day (QID) | ORAL | Status: DC | PRN

## 2021-03-02 MED ORDER — PROPOFOL 500 MG/50ML IV EMUL
INTRAVENOUS | Status: AC
  Filled 2021-03-02: qty 50

## 2021-03-02 MED ORDER — PROPOFOL 10 MG/ML IV BOLUS
INTRAVENOUS | Status: AC
  Filled 2021-03-02: qty 20

## 2021-03-02 MED ORDER — FAMOTIDINE 20 MG PO TABS: 20.0000 mg | ORAL_TABLET | Freq: Once | ORAL | Status: DC

## 2021-03-02 MED ORDER — CELECOXIB 200 MG PO CAPS: 200.0000 mg | ORAL_CAPSULE | ORAL | Status: AC

## 2021-03-02 MED ORDER — FENTANYL CITRATE (PF) 100 MCG/2ML IJ SOLN
INTRAMUSCULAR | Status: AC
  Filled 2021-03-02: qty 2

## 2021-03-02 MED ORDER — DEXMEDETOMIDINE (PRECEDEX) IN NS 20 MCG/5ML (4 MCG/ML) IV SYRINGE
PREFILLED_SYRINGE | INTRAVENOUS | Status: DC | PRN
  Administered 2021-03-02: 6 ug via INTRAVENOUS
  Administered 2021-03-02: 4 ug via INTRAVENOUS
  Administered 2021-03-02: 6 ug via INTRAVENOUS
  Administered 2021-03-02: 4 ug via INTRAVENOUS

## 2021-03-02 MED ORDER — ONDANSETRON HCL 4 MG/2ML IJ SOLN
4.0000 mg | Freq: Four times a day (QID) | INTRAMUSCULAR | Status: DC | PRN
  Administered 2021-03-03: 4 mg via INTRAVENOUS
  Filled 2021-03-02: qty 2

## 2021-03-02 MED ORDER — SODIUM CHLORIDE 0.9 % IV SOLN: 250.0000 mL | INTRAVENOUS | Status: DC

## 2021-03-02 MED ORDER — BUPIVACAINE LIPOSOME 1.3 % IJ SUSP
INTRAMUSCULAR | Status: AC
  Filled 2021-03-02: qty 20

## 2021-03-02 MED ORDER — ACETAMINOPHEN 325 MG PO TABS
650.0000 mg | ORAL_TABLET | Freq: Four times a day (QID) | ORAL | Status: DC | PRN
  Administered 2021-03-05: 650 mg via ORAL
  Filled 2021-03-02: qty 2

## 2021-03-02 MED ORDER — SODIUM CHLORIDE 0.9 % IV BOLUS
500.0000 mL | Freq: Once | INTRAVENOUS | Status: AC
  Administered 2021-03-02: 500 mL via INTRAVENOUS

## 2021-03-02 MED ORDER — LACTATED RINGERS IV SOLN: INTRAVENOUS | Status: DC

## 2021-03-02 MED ORDER — ONDANSETRON HCL 4 MG/2ML IJ SOLN
INTRAMUSCULAR | Status: DC | PRN
Start: 2021-03-02 — End: 2021-03-02
  Administered 2021-03-02: 4 mg via INTRAVENOUS

## 2021-03-02 MED ORDER — PROPOFOL 10 MG/ML IV BOLUS
INTRAVENOUS | Status: DC | PRN
Start: 2021-03-02 — End: 2021-03-02
  Administered 2021-03-02: 120 mg via INTRAVENOUS

## 2021-03-02 MED ORDER — ONDANSETRON HCL 4 MG/2ML IJ SOLN
INTRAMUSCULAR | Status: AC
  Filled 2021-03-02: qty 2

## 2021-03-02 MED ORDER — MIDAZOLAM HCL 2 MG/2ML IJ SOLN
INTRAMUSCULAR | Status: DC | PRN
  Administered 2021-03-02 (×2): 1 mg via INTRAVENOUS

## 2021-03-02 MED ORDER — GABAPENTIN 300 MG PO CAPS
300.0000 mg | ORAL_CAPSULE | Freq: Two times a day (BID) | ORAL | Status: DC
  Administered 2021-03-05: 300 mg via ORAL
  Filled 2021-03-02 (×5): qty 1

## 2021-03-02 MED ORDER — FENTANYL CITRATE (PF) 100 MCG/2ML IJ SOLN: 25.0000 ug | INTRAMUSCULAR | Status: DC | PRN

## 2021-03-02 MED ORDER — CELECOXIB 200 MG PO CAPS
ORAL_CAPSULE | ORAL | Status: AC
  Administered 2021-03-02: 200 mg via ORAL
  Filled 2021-03-02: qty 1

## 2021-03-02 MED ORDER — CELECOXIB 200 MG PO CAPS
ORAL_CAPSULE | ORAL | Status: AC
  Filled 2021-03-02: qty 1

## 2021-03-02 MED ORDER — BUPIVACAINE-EPINEPHRINE (PF) 0.5% -1:200000 IJ SOLN
INTRAMUSCULAR | Status: DC | PRN
  Administered 2021-03-02: 30 mL

## 2021-03-02 MED ORDER — ORAL CARE MOUTH RINSE: 15.0000 mL | Freq: Once | OROMUCOSAL | Status: AC

## 2021-03-02 MED ORDER — CHLORHEXIDINE GLUCONATE 0.12 % MT SOLN
OROMUCOSAL | Status: AC
  Administered 2021-03-02: 15 mL via OROMUCOSAL
  Filled 2021-03-02: qty 15

## 2021-03-02 MED ORDER — LIDOCAINE HCL URETHRAL/MUCOSAL 2 % EX GEL
CUTANEOUS | Status: AC
  Filled 2021-03-02: qty 5

## 2021-03-02 MED ORDER — PHENYLEPHRINE HCL (PRESSORS) 10 MG/ML IV SOLN
INTRAVENOUS | Status: AC
  Filled 2021-03-02: qty 1

## 2021-03-02 MED ORDER — HYDROMORPHONE HCL 1 MG/ML IJ SOLN
INTRAMUSCULAR | Status: AC
  Filled 2021-03-02: qty 1

## 2021-03-02 MED ORDER — OXYCODONE HCL 5 MG PO TABS: 5.0000 mg | ORAL_TABLET | Freq: Once | ORAL | Status: AC | PRN

## 2021-03-02 MED ORDER — HYDROCODONE-ACETAMINOPHEN 5-325 MG PO TABS
1.0000 | ORAL_TABLET | Freq: Four times a day (QID) | ORAL | Status: DC | PRN
  Administered 2021-03-05: 1 via ORAL
  Filled 2021-03-02: qty 1

## 2021-03-02 MED ORDER — ACETAMINOPHEN 10 MG/ML IV SOLN: 1000.0000 mg | Freq: Once | INTRAVENOUS | Status: DC | PRN

## 2021-03-02 MED ORDER — OXYCODONE HCL 5 MG/5ML PO SOLN: 5.0000 mg | Freq: Once | ORAL | Status: AC | PRN

## 2021-03-02 MED ORDER — DEXMEDETOMIDINE (PRECEDEX) IN NS 20 MCG/5ML (4 MCG/ML) IV SYRINGE
PREFILLED_SYRINGE | INTRAVENOUS | Status: AC
  Filled 2021-03-02: qty 5

## 2021-03-02 MED ORDER — NOREPINEPHRINE 4 MG/250ML-% IV SOLN
2.0000 ug/min | INTRAVENOUS | Status: DC
  Administered 2021-03-02: 4 ug/min via INTRAVENOUS
  Administered 2021-03-03: 2 ug/min via INTRAVENOUS
  Filled 2021-03-02 (×2): qty 250

## 2021-03-02 SURGICAL SUPPLY — 58 items
APPLIER CLIP 11 MED OPEN (CLIP)
BINDER BREAST MEDIUM (GAUZE/BANDAGES/DRESSINGS) ×1 IMPLANT
BLADE SURG 15 STRL LF DISP TIS (BLADE) ×1 IMPLANT
BLADE SURG 15 STRL SS (BLADE) ×1
BULB RESERV EVAC DRAIN JP 100C (MISCELLANEOUS) ×1 IMPLANT
CHLORAPREP W/TINT 26 (MISCELLANEOUS) ×2 IMPLANT
CLIP APPLIE 11 MED OPEN (CLIP) IMPLANT
CNTNR SPEC 2.5X3XGRAD LEK (MISCELLANEOUS)
CONT SPEC 4OZ STER OR WHT (MISCELLANEOUS)
CONTAINER SPEC 2.5X3XGRAD LEK (MISCELLANEOUS) IMPLANT
DERMABOND ADVANCED (GAUZE/BANDAGES/DRESSINGS) ×1
DERMABOND ADVANCED .7 DNX12 (GAUZE/BANDAGES/DRESSINGS) ×1 IMPLANT
DEVICE DSSCT PLSMBLD 3.0S LGHT (MISCELLANEOUS) ×1 IMPLANT
DEVICE DUBIN SPECIMEN MAMMOGRA (MISCELLANEOUS) ×1 IMPLANT
DRAIN CHANNEL JP 15F RND 16 (MISCELLANEOUS) ×1 IMPLANT
DRAPE LAPAROTOMY TRNSV 106X77 (MISCELLANEOUS) ×2 IMPLANT
DRSG GAUZE FLUFF 36X18 (GAUZE/BANDAGES/DRESSINGS) ×2 IMPLANT
ELECT REM PT RETURN 9FT ADLT (ELECTROSURGICAL) ×2
ELECTRODE REM PT RTRN 9FT ADLT (ELECTROSURGICAL) ×1 IMPLANT
GAUZE 4X4 16PLY ~~LOC~~+RFID DBL (SPONGE) ×2 IMPLANT
GAUZE SPONGE 4X4 12PLY STRL (GAUZE/BANDAGES/DRESSINGS) IMPLANT
GLOVE SURG SYN 6.5 ES PF (GLOVE) ×2 IMPLANT
GLOVE SURG SYN 6.5 PF PI (GLOVE) ×1 IMPLANT
GLOVE SURG UNDER POLY LF SZ7 (GLOVE) ×2 IMPLANT
GOWN STRL REUS W/ TWL LRG LVL3 (GOWN DISPOSABLE) ×2 IMPLANT
GOWN STRL REUS W/TWL LRG LVL3 (GOWN DISPOSABLE) ×2
ILLUMINATOR WAVEGUIDE N/F (MISCELLANEOUS) ×1 IMPLANT
KIT MARKER MARGIN INK (KITS) IMPLANT
KIT TURNOVER KIT A (KITS) ×2 IMPLANT
LABEL OR SOLS (LABEL) ×2 IMPLANT
LIGHT WAVEGUIDE WIDE FLAT (MISCELLANEOUS) IMPLANT
MANIFOLD NEPTUNE II (INSTRUMENTS) ×2 IMPLANT
NEEDLE HYPO 22GX1.5 SAFETY (NEEDLE) ×4 IMPLANT
PACK BASIN MINOR ARMC (MISCELLANEOUS) ×2 IMPLANT
PLASMABLADE 3.0S W/LIGHT (MISCELLANEOUS) ×2
SLEVE PROBE SENORX GAMMA FIND (MISCELLANEOUS) ×1 IMPLANT
SPONGE T-LAP 18X18 ~~LOC~~+RFID (SPONGE) ×4 IMPLANT
STAPLER SKIN PROX 35W (STAPLE) ×1 IMPLANT
SUT ETHILON 3-0 FS-10 30 BLK (SUTURE) ×2
SUT MNCRL 4-0 (SUTURE) ×1
SUT MNCRL 4-0 27XMFL (SUTURE) ×1
SUT SILK 2 0 (SUTURE) ×1
SUT SILK 2 0 SH (SUTURE) ×2 IMPLANT
SUT SILK 2-0 30XBRD TIE 12 (SUTURE) ×1 IMPLANT
SUT SILK 3 0 12 30 (SUTURE) ×2 IMPLANT
SUT SILK 3-0 (SUTURE) ×2 IMPLANT
SUT VIC AB 2-0 SH 27 (SUTURE) ×1
SUT VIC AB 2-0 SH 27XBRD (SUTURE) ×1 IMPLANT
SUT VIC AB 3-0 SH 27 (SUTURE) ×5
SUT VIC AB 3-0 SH 27X BRD (SUTURE) ×4 IMPLANT
SUTURE EHLN 3-0 FS-10 30 BLK (SUTURE) IMPLANT
SUTURE MNCRL 4-0 27XMF (SUTURE) ×1 IMPLANT
SYR 10ML LL (SYRINGE) ×2 IMPLANT
SYR 20ML LL LF (SYRINGE) ×4 IMPLANT
SYR BULB IRRIG 60ML STRL (SYRINGE) ×2 IMPLANT
TUBING CONNECTING 10 (TUBING) ×2 IMPLANT
WATER STERILE IRR 1000ML POUR (IV SOLUTION) ×1 IMPLANT
WATER STERILE IRR 500ML POUR (IV SOLUTION) ×2 IMPLANT

## 2021-03-02 NOTE — Progress Notes (Signed)
Patient awake/alert x4. Phenylephrine gtt at 34ml/20mcq, heart rate low 38-47. Sbp 105. Per Dr. Bertell Maria , pause phenylephrine briefly and monitor heart rate and bp.

## 2021-03-02 NOTE — Anesthesia Postprocedure Evaluation (Signed)
Anesthesia Post Note  Patient: Elizabeth [name]  Procedure(s) Performed: MASTECTOMY MODIFIED RADICAL (Right: Axilla)  Patient location during evaluation: PACU Anesthesia Type: General Level of consciousness: awake and alert Pain management: pain level controlled Vital Signs Assessment: vitals unstable Respiratory status: spontaneous breathing, nonlabored ventilation and respiratory function stable Cardiovascular status: stable and unstable Postop Assessment: no apparent nausea or vomiting Anesthetic complications: no Comments: Pt was evaluted by Dr. Bertell Maria. Please refer to his note for details. This patient was admitted for management of a phenylephrine drip due to presumbed vasoplegia from anesthesia. The phenylephrine was weaned off by 21:00.    No notable events documented.   Last Vitals:  Vitals:   03/02/21 2030 03/02/21 2100  BP: (!) 94/51 (!) 99/49  Pulse: (!) 57 60  Resp: (!) 8 16  Temp:    SpO2: 100% 99%    Last Pain:  Vitals:   03/02/21 2000  TempSrc: Oral  PainSc: 0-No pain                 [place] Ouch

## 2021-03-02 NOTE — Progress Notes (Signed)
eLink Physician-Brief Progress Note Patient Name: Elizabeth [name] DOB: 1950-01-23 MRN: [id number]   Date of Service  03/02/2021  HPI/Events of Note  71 year old female s/p modified radical mastectomy on 03/02/20. Post-op she had persistent hypotension refractory to 2.5L fluid resuscitation requiring low-dose neo gtt. Suspected to be secondary to anesthesia.  Camera check: Patient awake and alert. Neo weaned down to 10 mcg/min.  eICU Interventions  Elink available as needed     Intervention Category Evaluation Type: New Patient Evaluation  [name] 03/02/2021, 8:02 PM

## 2021-03-02 NOTE — Transfer of Care (Signed)
Immediate Anesthesia Transfer of Care Note  Patient: Elizabeth [name]  Procedure(s) Performed: MASTECTOMY MODIFIED RADICAL (Right: Axilla)  Patient Location: PACU  Anesthesia Type:General  Level of Consciousness: drowsy  Airway & Oxygen Therapy: Patient Spontanous Breathing and Patient connected to face mask oxygen  Post-op Assessment: Report given to RN and Post -op Vital signs reviewed and stable  Post vital signs: Reviewed and stable  Last Vitals:  Vitals Value Taken Time  BP 92/58 03/02/21 1420  Temp    Pulse 87 03/02/21 1423  Resp 8 03/02/21 1423  SpO2 97 % 03/02/21 1423  Vitals shown include unvalidated device data.  Last Pain:  Vitals:   03/02/21 0904  TempSrc: Temporal  PainSc: 0-No pain         Complications: No notable events documented.

## 2021-03-02 NOTE — Progress Notes (Signed)
Dr. Bertell Maria at bedside:  anesthesia Restart phenylephrine at 66mcq, monitor heart rate, if heart rate lower then 40, may give 0.2mg  Robinul IV x1.

## 2021-03-02 NOTE — Consult Note (Signed)
NAME:  Elizabeth [name], MRN:  [id number], DOB:  02/17/1950, LOS: 0 ADMISSION DATE:  03/02/2021, CONSULTATION DATE:  03/02/21 REFERRING MD:  Dr. Lysle Pearl, CHIEF COMPLAINT:  Breast Cancer & Hypotension  History of Present Illness:  71 yo F presenting to [facility] on 03/02/21 for an elective modified RIGHT radical mastectomy in the setting of breast cancer with lymph node biopsy also positive for mets. Procedure completed without complication. Post operatively patient hypotensive requiring low dose peripheral vasopressor support.  Bedside patient denies any signs/symptoms of recent infection, but does admit to not eating/drinking that much over the past few days.  PCCM consulted for additional management and monitoring post-op.  Initial Vitals: 98.1, RR-18, HR- 86, BP-115/70 & SpO2 96% on room air Significant labs: (Labs/ Imaging personally reviewed) Cr: 0.80 Hematology: WBC: 14, Hgb: 8.3,  COVID-19 & Influenza A/B: negative  Pertinent  Medical History  Anemia Anxiety & depression Bipolar disorder  Significant Hospital Events: Including procedures, antibiotic start and stop dates in addition to other pertinent events   03/02/21: Patient admitted to ICU post-op on low dose peripheral vasopressor support s/p right radical masectomy  Interim History / Subjective:  Patient alert and oriented, no current complaints except some soreness around the area of her mastectomy.  Vital stable on low-dose peripheral vasopressor  Objective   Blood pressure (!) 117/54, pulse (!) 54, temperature 98 F (36.7 C), temperature source Oral, resp. rate 12, height 5\' 1"  (1.549 m), weight 53.1 kg, SpO2 100 %.        Intake/Output Summary (Last 24 hours) at 03/02/2021 2045 Last data filed at 03/02/2021 1925 Gross per 24 hour  Intake 1400 ml  Output 275 ml  Net 1125 ml   Filed Weights   03/02/21 0904  Weight: 53.1 kg    Examination: General: Adult female, acutely ill, lying in bed, NAD HEENT: MM  pink/moist, anicteric, atraumatic, neck supple, glasses in place Neuro: A&O x 4, able to follow commands, PERRL +3, MAE CV: s1s2 RRR, SBP in 50s on monitor, no r/m/g Pulm: Regular, non labored on room air, breath sounds clear-BUL & diminished-BLL GI: soft, rounded, non tender, bs x 4 Skin: Surgical site covered with dressing and binder- no rashes/lesions noted Extremities: warm/dry, pulses + 2 R/P, no edema noted  Resolved Hospital Problem list     Assessment & Plan:  Postoperative circulatory shock multifactorial in the setting of anesthesia and suspected hypovolemia from poor p.o. intake Patient received 500 mL LR & started on Neo-Synephrine drip. Hgb stable at 8.3 - Continue LR infusion - Additional LR bolus - Neo-Synephrine changed to norepinephrine, wean as tolerated to maintain MAP >65 -Continuous cardiac monitoring - daily BMP, replace electrolytes PRN - strict I&O's, alert provider if UOP < 0.5 mL/kg/hr  Post-Op Pain management - continue Ibuprofen, Norco/vicodin & tramadol PRN with Dilaudid PRN for severe breakthrough pain per primary service  Hypothyroidism - continue Synthroid   Anxiety/Bipolar disorder - continue Klonopin PRN, per primary service  Hyperlipidemia - continue atorvastatin per primary service  Best Practice (right click and "Reselect all SmartList Selections" daily)  Diet/type: clear liquids DVT prophylaxis: LMWH GI prophylaxis: PPI Lines: N/A Foley:  N/A Code Status:  full code Last date of multidisciplinary goals of care discussion [per primary service-03/02/21]  Labs   CBC: Recent Labs  Lab 02/26/21 1405  WBC 7.6  HGB 9.6*  HCT 29.7*  MCV 86.6  PLT 644    Basic Metabolic Panel: Recent Labs  Lab 02/26/21 1405  NA 137  K 3.8  CL 105  CO2 25  GLUCOSE 95  BUN 33*  CREATININE 0.84  CALCIUM 8.5*   GFR: Estimated Creatinine Clearance: 47 mL/min (by C-G formula based on SCr of 0.84 mg/dL). Recent Labs  Lab 02/26/21 1405  WBC  7.6    Liver Function Tests: No results for input(s): AST, ALT, ALKPHOS, BILITOT, PROT, ALBUMIN in the last 168 hours. No results for input(s): LIPASE, AMYLASE in the last 168 hours. No results for input(s): AMMONIA in the last 168 hours.  ABG No results found for: PHART, PCO2ART, PO2ART, HCO3, TCO2, ACIDBASEDEF, O2SAT   Coagulation Profile: No results for input(s): INR, PROTIME in the last 168 hours.  Cardiac Enzymes: No results for input(s): CKTOTAL, CKMB, CKMBINDEX, TROPONINI in the last 168 hours.  HbA1C: No results found for: HGBA1C  CBG: Recent Labs  Lab 03/02/21 1949  GLUCAP 128*    Review of Systems: Positives in BOLD  Gen: Denies fever, chills, weight change, fatigue, night sweats, Right chest soreness over surgical site HEENT: Denies blurred vision, double vision, hearing loss, tinnitus, sinus congestion, rhinorrhea, sore throat, neck stiffness, dysphagia PULM: Denies shortness of breath, cough, sputum production, hemoptysis, wheezing CV: Denies chest pain, edema, orthopnea, paroxysmal nocturnal dyspnea, palpitations GI: Denies abdominal pain, nausea, vomiting, diarrhea, hematochezia, melena, constipation, change in bowel habits GU: Denies dysuria, hematuria, polyuria, oliguria, urethral discharge Endocrine: Denies hot or cold intolerance, polyuria, polyphagia or appetite change Derm: Denies rash, dry skin, scaling or peeling skin change Heme: Denies easy bruising, bleeding, bleeding gums Neuro: Denies headache, numbness, weakness, slurred speech, loss of memory or consciousness  Past Medical History:  She,  has a past medical history of Acute cholecystitis (2016), Anemia, Anemia (2016), Anxiety, Anxiety and depression, Bipolar 1 disorder ([place]), Bowel obstruction ([place]) (2017), Common bile duct dilatation (2015), Dyspnea, Edema, Fibromyalgia, GI bleed (2017), History of kidney stones (01/21/2009), HSV-1 infection, Hyperglycemia, Hyperlipidemia, Hypokalemia,  Hypothyroidism, Liver cyst, Malignant neoplasm of right female breast, unspecified estrogen receptor status, unspecified site of breast ([place]), Memory loss, Osteopenia, Panic disorder, Schizophrenia ([place]), and Ventral hernia.   Surgical History:   Past Surgical History:  Procedure Laterality Date   ABDOMINAL HYSTERECTOMY     BREAST BIOPSY Right 01/19/2021   [place] Bx, ribbon, path pending   BREAST BIOPSY Right 01/19/2021   [place] Bx, Venus, path pending   BREAST BIOPSY Right 01/19/2021   [place] Bx, heart, path pending   BREAST BIOPSY Right 01/19/2021   [place] BX Axilla, Coil, path pending   CESAREAN SECTION     CHOLECYSTECTOMY OPEN  11/17/2013   with repair of bile duct-Dr. Marina Gravel   OPEN REDUCTION INTERNAL FIXATION (ORIF) DISTAL RADIAL FRACTURE Right 12/20/2014   Procedure: OPEN REDUCTION INTERNAL FIXATION (ORIF) RIGHT DISTAL RADIAL FRACTURE;  Surgeon: Renette Butters, MD;  Location: [facility];  Service: Orthopedics;  Laterality: Right;   STOMACH SURGERY       Social History:   reports that she quit smoking about 47 years ago. Her smoking use included cigarettes. She has been exposed to tobacco smoke. She has never used smokeless tobacco. She reports that she does not drink alcohol and does not use drugs.   Family History:  Her family history includes Asthma in her daughter; Breast cancer in her paternal aunt and paternal grandmother; Heart disease in her father; Kidney disease in her paternal aunt. There is no history of Bladder Cancer.   Allergies Allergies  Allergen Reactions   Penicillins Hives    TOLERATED CEFAZOLIN  Home Medications  Prior to Admission medications   Medication Sig Start Date End Date Taking? Authorizing Provider  alendronate (FOSAMAX) 70 MG tablet Take 1 tablet by mouth every Monday. 03/09/20  Yes [provider]  atorvastatin (LIPITOR) 20 MG tablet Take 20 mg by mouth daily.   Yes [provider]  clonazePAM (KLONOPIN) 1 MG tablet  Take 1 mg by mouth 2 (two) times daily as needed for anxiety.   Yes [provider]  levothyroxine (SYNTHROID, LEVOTHROID) 88 MCG tablet Take 88 mcg by mouth daily before breakfast.   Yes [provider]     Critical care time: 45 minutes       Venetia Night, AGACNP-BC Acute Care Nurse Practitioner [facility]   [phone] / [phone] Please see Amion for pager details.

## 2021-03-02 NOTE — Anesthesia Preprocedure Evaluation (Addendum)
Anesthesia Evaluation  Patient identified by MRN, date of birth, ID band Patient awake    Reviewed: Allergy & Precautions, NPO status , Patient's Chart, lab work & pertinent test results  Airway Mallampati: II  TM Distance: >3 FB Neck ROM: full    Dental no notable dental hx. (+) Poor Dentition   Pulmonary shortness of breath, former smoker,    Pulmonary exam normal        Cardiovascular Exercise Tolerance: Poor METS: 3 - Mets + DOE  Normal cardiovascular exam     Neuro/Psych PSYCHIATRIC DISORDERS Anxiety Depression Bipolar Disorder Schizophrenia negative neurological ROS     GI/Hepatic negative GI ROS, Neg liver ROS,   Endo/Other  Hypothyroidism   Renal/GU      Musculoskeletal  (+) Fibromyalgia -  Abdominal   Peds  Hematology negative hematology ROS (+)   Anesthesia Other Findings Breast cancer metastasized to axillary lymph node  Past Medical History: No date: Anemia 2016: Anemia No date: Anxiety     Comment:  panic attacks No date: Anxiety and depression No date: Bipolar 1 disorder ([place]) 2017: Bowel obstruction ([place]) 2015: Common bile duct dilatation No date: Dyspnea No date: Edema No date: Fibromyalgia 2017: GI bleed 01/21/2009: History of kidney stones 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 No date: Hyperglycemia No date: Hyperlipidemia No date: Hypokalemia No date: Hypothyroidism No date: Liver cyst No date: Malignant neoplasm of right female breast, unspecified  estrogen receptor status, unspecified site of breast ([place]) No date: Memory loss No date: Osteopenia No date: Panic disorder No date: Schizophrenia ([place]) No date: Ventral hernia  Past Surgical History: No date: ABDOMINAL HYSTERECTOMY 01/19/2021: BREAST BIOPSY; Right     Comment:  [place] Bx, ribbon, path pending 01/19/2021: BREAST BIOPSY; Right     Comment:  [place] Bx, Venus, path pending 01/19/2021: BREAST BIOPSY; Right     Comment:  [place] Bx, heart,  path pending 01/19/2021: BREAST BIOPSY; Right     Comment:  [place] BX Axilla, Coil, path pending No date: CESAREAN SECTION 11/17/2013: CHOLECYSTECTOMY OPEN     Comment:  with repair of bile duct-Dr. Marina Gravel 12/20/2014: OPEN REDUCTION INTERNAL FIXATION (ORIF) DISTAL RADIAL  FRACTURE; Right     Comment:  Procedure: OPEN REDUCTION INTERNAL FIXATION (ORIF) RIGHT              DISTAL RADIAL FRACTURE;  Surgeon: Renette Butters, MD;                Location: [place];  Service:               Orthopedics;  Laterality: Right; No date: STOMACH SURGERY     Reproductive/Obstetrics negative OB ROS                           Anesthesia Physical Anesthesia Plan  ASA: 3  Anesthesia Plan: General   Post-op Pain Management: Tylenol PO (pre-op), Celebrex PO (pre-op) and Gabapentin PO (pre-op)   Induction: Intravenous  PONV Risk Score and Plan: Dexamethasone, Ondansetron, Midazolam and Treatment may vary due to age or medical condition  Airway Management Planned: Oral ETT  Additional Equipment:   Intra-op Plan:   Post-operative Plan: Extubation in OR  Informed Consent: I have reviewed the patients History and Physical, chart, labs and discussed the procedure including the risks, benefits and alternatives for the proposed anesthesia with the patient or authorized representative who has indicated his/her understanding and acceptance.     Dental Advisory Given  Plan Discussed with: Anesthesiologist, CRNA and Surgeon  Anesthesia Plan Comments: (Pt with irritated left eye after showering this morning. There is slight redness and a mobile foreign body, possibly a hair in her eye. The eye will be irrigated prior to the OR. She complains of slight blurriness in the eye. The pupils is equally round and reactive to light. EOMI.  )      Anesthesia Quick Evaluation

## 2021-03-02 NOTE — Interval H&P Note (Signed)
No change. OK to proceed.

## 2021-03-02 NOTE — Anesthesia Procedure Notes (Signed)
Procedure Name: LMA Insertion Date/Time: 03/02/2021 10:17 AM Performed by: Bea Graff, RN Pre-anesthesia Checklist: Patient identified, Patient being monitored, Timeout performed, Emergency Drugs available and Suction available Patient Re-evaluated:Patient Re-evaluated prior to induction Oxygen Delivery Method: Circle system utilized Preoxygenation: Pre-oxygenation with 100% oxygen Induction Type: IV induction LMA: LMA inserted LMA Size: 3.0 Tube type: Oral Number of attempts: 1 Placement Confirmation: positive ETCO2 and breath sounds checked- equal and bilateral Tube secured with: Tape Dental Injury: Teeth and Oropharynx as per pre-operative assessment

## 2021-03-02 NOTE — Op Note (Signed)
Preoperative diagnosis: right Breast Cancer.  Postoperative diagnosis: same.   Procedure: right modified radical mastectomy  Anesthesia: GETA  Surgeon: Dr. Lysle Pearl Assistant: Peyton Najjar diaz for exposure  Wound Classification: Clean  Specimen: right Breast and axilla  Complications: None  Estimated Blood Loss: 25 mL   Indications: Patient is a 71 y.o. female who had an abnormal mammogram that on workup with core needle biopsy was found to be breast CA with LN biopsy also positive for mets. After discussion of alternatives, the patient elected modified radical mastectomy, in hopes of avoiding radiation therapy  Findings: Palpable breat mass, with no chest wall involvement Grossly enlarged lymph nodes within rigth axilla.  Numerous, but no level II involvement Visible vasculature and nerves preserved. Hydromarker within LN noted in axillary specimen on radiograph  Operation performed with curative intent:Yes  Tracer(s) used to identify sentinel nodes in the upfront surgery (non-neoadjuvant) setting (select all that apply):N/A  Tracer(s) used to identify sentinel nodes in the neoadjuvant setting (select all that apply):N/A  All nodes (colored or non-colored) present at the end of a dye-filled lymphatic channel were removed:N/A  All significantly radioactive nodes were removed:N/A  All palpable suspicious nodes were removed:Yes  Biopsy-proven positive nodes marked with clips prior to chemotherapy were identified and removed:Yes   Description of procedure: The patient was brought to the operating room and general anesthesia was induced. A time-out was completed verifying correct patient, procedure, site, positioning, and implant(s) and/or special equipment prior to beginning this procedure. The breast, chest wall, axilla, and upper arm and neck were prepped and draped in the usual sterile fashion.  A skin incision was made that encompassed the nipple-areola complex and the palpable  mass and passed in an oblique direction across the breast.    Skin flaps were raised in the avascular plane between subcutaneous tissue and breast tissue from the clavicle superiorly, the sternum medially, the anterior rectus sheath inferiorly, and past the lateral border of the pectoralis major muscle laterally. Hemostasis was achieved in the flaps. Next, the breast tissue and underlying pectoralis fascia were excised from the pectoralis major muscle, progressing from medially to laterally. At the lateral border of the pectoralis major muscle, the breast tissue was swung laterally and a lateral pedicle identified where breast tissue gave way to fat of axilla. The lateral pedicle was incised and the specimen removed.   Specimen marked long lateral, short superior and sent to pathology.  The right axillary fat pad was then carefully dissected from surrounding tissue.  The latissimus dorsi muscle was identified and dissection carried towards the axillary vein.  The dissection was then focused towards the medial aspect to identify the neurovascular bundle and associated nerves along the serratus anterior..  Once the neurovascular bundle and axillary vein was identified, the entire axillary fat pad along with all palpable lymph nodes within were meticulously dissected off the vital structures and musculature.  Smaller veins were suture ligated as needed to complete dissection.  Once the axillary contents were dissected completely, radiograph images confirm previously placed HydroMARK  within one of the dissected lymph nodes.  The wound was irrigated and hemostasis was achieved. Closed suction drain was brought into the operative field through a separate stab incision and sutured to the skin with a 3-0 nylon suture. The wound was closed with interrupted 3-0 Vicryl to the subcutaneous layer, followed by a staples.  The wound was dressed with drain sponge, 4 x 4's and fluffs.  Mastectomy bra placed.  The patient  tolerated  the procedure well and was taken to the postanesthesia care unit in stable condition.

## 2021-03-02 NOTE — Care Management CC44 (Signed)
Condition Code 44 Documentation Completed  Patient Details  Name: DONAE KUEKER MRN: [id number] Date of Birth: 04/29/1950   Condition Code 44 given:  Yes Patient signature on Condition Code 44 notice:  Yes Documentation of 2 MD's agreement:  Yes Code 44 added to claim:  Yes    Anselm Pancoast, RN 03/02/2021, 4:55 PM

## 2021-03-02 NOTE — Progress Notes (Signed)
An USGPIV (ultrasound guided PIV) has been placed for short-term vasopressor infusion. A correctly placed ivWatch must be used when administering Vasopressors. Should this treatment be needed beyond 72 hours, central line access should be obtained.  It will be the responsibility of the bedside nurse to follow best practice to prevent extravasations.   ?

## 2021-03-02 NOTE — Care Management Obs Status (Signed)
[place] NOTIFICATION   Patient Details  Name: Elizabeth [name] MRN: [id number] Date of Birth: 05/12/1950   Medicare Observation Status Notification Given:  Yes    Anselm Pancoast, RN 03/02/2021, 4:55 PM

## 2021-03-02 NOTE — Progress Notes (Signed)
° °  Patient in PACU with persistent hypotension since arriving around 3 hours ago. Systolics ranging from 58E-WYBR 80s, MAPs in low 60s. Patient is otherwise stable with no complaints, minimal drain output from surgical sites. S/P a total of around 2.5L crystalloid with not much improvement. I suspect likely residual anesthetic in an older, smaller, frail patient. Low concern currently for any serious causes.  Spoke with Dr Lysle Pearl and we agreed it was best to have her in a stepdown bed with a phenylephrine drip until residual anesthetic has cleared from system.

## 2021-03-03 ENCOUNTER — Encounter: Payer: Self-pay | Admitting: Surgery

## 2021-03-03 ENCOUNTER — Inpatient Hospital Stay: Payer: Medicare HMO

## 2021-03-03 DIAGNOSIS — R579 Shock, unspecified: Secondary | ICD-10-CM

## 2021-03-03 LAB — MAGNESIUM: Magnesium: 1.9 mg/dL (ref 1.7–2.4)

## 2021-03-03 LAB — BASIC METABOLIC PANEL
Anion gap: 8 (ref 5–15)
BUN: 19 mg/dL (ref 8–23)
CO2: 23 mmol/L (ref 22–32)
Calcium: 8 mg/dL — ABNORMAL LOW (ref 8.9–10.3)
Chloride: 109 mmol/L (ref 98–111)
Creatinine, Ser: 0.93 mg/dL (ref 0.44–1.00)
GFR, Estimated: >60 mL/min (ref >60)
Glucose, Bld: 160 mg/dL — ABNORMAL HIGH (ref 70–99)
Potassium: 4 mmol/L (ref 3.5–5.1)
Sodium: 140 mmol/L (ref 135–145)

## 2021-03-03 LAB — CBC
HCT: 24.5 % — ABNORMAL LOW (ref 36.0–46.0)
Hemoglobin: 7.7 g/dL — ABNORMAL LOW (ref 12.0–15.0)
MCH: 28 pg (ref 26.0–34.0)
MCHC: 31.4 g/dL (ref 30.0–36.0)
MCV: 89.1 fL (ref 80.0–100.0)
Platelets: 358 10*3/uL (ref 150–400)
RBC: 2.75 MIL/uL — ABNORMAL LOW (ref 3.87–5.11)
RDW: 14.8 % (ref 11.5–15.5)
WBC: 9.2 10*3/uL (ref 4.0–10.5)
nRBC: 0 % (ref 0.0–0.2)

## 2021-03-03 LAB — ABO/RH: ABO/RH(D): O POS

## 2021-03-03 LAB — PREPARE RBC (CROSSMATCH)

## 2021-03-03 LAB — ALBUMIN: Albumin: 2.5 g/dL — ABNORMAL LOW (ref 3.5–5.0)

## 2021-03-03 LAB — HEMOGLOBIN AND HEMATOCRIT, BLOOD
HCT: 23.1 % — ABNORMAL LOW (ref 36.0–46.0)
Hemoglobin: 7.3 g/dL — ABNORMAL LOW (ref 12.0–15.0)

## 2021-03-03 LAB — PHOSPHORUS: Phosphorus: 3.7 mg/dL (ref 2.5–4.6)

## 2021-03-03 MED ORDER — POLYETHYLENE GLYCOL 3350 17 G PO PACK
17.0000 g | PACK | Freq: Every day | ORAL | Status: DC | PRN
  Administered 2021-03-04: 17 g via ORAL
  Filled 2021-03-03 (×2): qty 1

## 2021-03-03 MED ORDER — SODIUM CHLORIDE 0.9% IV SOLUTION: Freq: Once | INTRAVENOUS | Status: AC

## 2021-03-03 MED ORDER — ALBUMIN HUMAN 25 % IV SOLN
25.0000 g | Freq: Four times a day (QID) | INTRAVENOUS | Status: AC
  Administered 2021-03-03 (×2): 25 g via INTRAVENOUS
  Filled 2021-03-03 (×2): qty 100

## 2021-03-03 MED ORDER — DOCUSATE SODIUM 100 MG PO CAPS: 100.0000 mg | ORAL_CAPSULE | Freq: Two times a day (BID) | ORAL | Status: DC | PRN

## 2021-03-03 NOTE — Progress Notes (Signed)
OT Cancellation Note  Patient Details Name: Elizabeth [name] MRN: [id number] DOB: 07/18/1950   Cancelled Treatment:    Reason Eval/Treat Not Completed: Other (comment). Consult received, chart reviewed. Pt working with PT. Will re-attempt at later date/time as pt is available and medically appropriate.   Ardeth Perfect., MPH, MS, OTR/L ascom [phone] 03/03/21, 3:09 PM

## 2021-03-03 NOTE — Progress Notes (Addendum)
Acute Anemia in the setting of radical masectomy PMHx: Iron deficiency anemia? Discussed at length recommendation regarding receiving 1 unit of blood with son [name] present. Patient anxious about having a reaction to the blood, but understands why it is important and gave consent for blood products. All questions and concerns answered at this time. Hgb: 8.3 > 7.7 > 7.3 - 1 unit of PRBC's ordered for transfusion, pt has received blood before without a reaction, no pre-meds ordered - Monitor for s/s of bleeding - Daily CBC - Transfuse for Hgb <7, due to peripheral vasopressor requirement post-op and the fact the her Hgb is trending down post op one unit of blood administration recommended by Dr. Hale Bogus, AGACNP-BC Acute Care Nurse Practitioner [place] [facility]   [phone] / [phone] Please see Amion for pager details.

## 2021-03-03 NOTE — Progress Notes (Signed)
Subjective:  CC: Elizabeth [name] is a 71 y.o. female  Hospital stay day 1, 1 Day Post-Op modified radical mastectomy  HPI: Postop persistent hypotension requiring Neo infusion in PACU.  Reflex bradycardia noted confirmed sinus bradycardia on EKG.  Patient unable to wean off neo in PACU so was transferred to stepdown unit for further pressor support.  Despite this patient does not complain of any specific issues.  This morning patient remains mostly asymptomatic and pressor support has gradually decreased, currently on Levophed minimal infusion rate.  ROS:  General: Denies weight loss, weight gain, fatigue, fevers, chills, and night sweats. Heart: Denies chest pain, palpitations, racing heart, irregular heartbeat, leg pain or swelling, and decreased activity tolerance. Respiratory: Denies breathing difficulty, shortness of breath, wheezing, cough, and sputum. GI: Denies change in appetite, heartburn, nausea, vomiting, constipation, diarrhea, and blood in stool. GU: Denies difficulty urinating, pain with urinating, urgency, frequency, blood in urine.   Objective:   Temp:  [97.5 F (36.4 C)-98.5 F (36.9 C)] 97.7 F (36.5 C) (02/11 0800) Pulse Rate:  [38-89] 88 (02/11 1100) Resp:  [7-25] 14 (02/11 1100) BP: (78-123)/(42-75) 97/53 (02/11 1100) SpO2:  [91 %-100 %] 100 % (02/11 1100)     Height: 5\' 1"  (154.9 cm) Weight: 53.1 kg BMI (Calculated): 22.13   Intake/Output this shift:   Intake/Output Summary (Last 24 hours) at 03/03/2021 1131 Last data filed at 03/03/2021 1044 Gross per 24 hour  Intake 2569.13 ml  Output 1205 ml  Net 1364.13 ml    Constitutional :  alert, cooperative, appears stated age, and no distress  Respiratory:  clear to auscultation bilaterally  Cardiovascular:  regular rate and rhythm     Skin: Cool and moist.  Mastectomy bra in place with minimal staining around drain site.  Serosanguineous.  JP with slightly more sanguinous output.  Total of 55 mL recorded.Marland Kitchen   Psychiatric: Normal affect, non-agitated, not confused       LABS:  CMP Latest Ref Rng & Units 03/03/2021 03/02/2021 02/26/2021  Glucose 70 - 99 mg/dL 160(H) - 95  BUN 8 - 23 mg/dL 19 - 33(H)  Creatinine 0.44 - 1.00 mg/dL 0.93 0.80 0.84  Sodium 135 - 145 mmol/L 140 - 137  Potassium 3.5 - 5.1 mmol/L 4.0 - 3.8  Chloride 98 - 111 mmol/L 109 - 105  CO2 22 - 32 mmol/L 23 - 25  Calcium 8.9 - 10.3 mg/dL 8.0(L) - 8.5(L)  Total Protein 6.5 - 8.1 g/dL - - -  Total Bilirubin 0.3 - 1.2 mg/dL - - -  Alkaline Phos 38 - 126 U/L - - -  AST 15 - 41 U/L - - -  ALT 14 - 54 U/L - - -   CBC Latest Ref Rng & Units 03/03/2021 03/02/2021 02/26/2021  WBC 4.0 - 10.5 K/uL 9.2 14.0(H) 7.6  Hemoglobin 12.0 - 15.0 g/dL 7.7(L) 8.3(L) 9.6(L)  Hematocrit 36.0 - 46.0 % 24.5(L) 27.4(L) 29.7(L)  Platelets 150 - 400 K/uL 358 305 380    RADS: N/a Assessment:   Status post modified radical mastectomy for biopsy-proven breast cancer with lymph node metastasis.  Patient postoperative hypotension continue to improve with pressor support.  We will continue until no further pressors needed in the stepdown unit.  Looking well otherwise from a surgery standpoint.  Continue to advance diet as tolerated.  labs/images/medications/previous chart entries reviewed personally and relevant changes/updates noted above.

## 2021-03-03 NOTE — TOC Progression Note (Addendum)
Transition of Care [facility]) - Progression Note    Patient Details  Name: Elizabeth [name] MRN: [id number] Date of Birth: July 25, 1950  Transition of Care [facility] CM/SW [place], [place] Phone Number: 03/03/2021, 4:17 PM  Clinical Narrative:    CSW spoke to patient and patient reported to preferences or history of Home Health agencies. CSW spoke with patient's son [name] [phone]) and confirmed no HH preferences. Cory at [facility] confirmed accepting patient for HHPT.        Expected Discharge Plan and Services                                                 Social Determinants of Health (SDOH) Interventions    Readmission Risk Interventions No flowsheet data found.

## 2021-03-03 NOTE — Evaluation (Signed)
Physical Therapy Evaluation Patient Details Name: Elizabeth [name] MRN: [id number] DOB: 16-Sep-1950 Today's Date: 03/03/2021  History of Present Illness  Pt is a 71 yo female s/p R radical masectomy, transferred to ICU after due to hypotension. PMH of hx of hyperlipidemia, hypothyroidism, anxiety, breast cancer, fibromyalgia, schizophrenia, and bipolar disorder.   Clinical Impression  Pt alert, oriented, by emotionally labile during session, needed encouragement and comfort throughout session. Reported at baseline her daughter is staying with her, independent for mobility.  The patient's BP was monitored throughout, and noted to be low (pt on levophed per RN, only able to turn it off for about an hour prior to PT arrival). Several supine exercises performed, verbal and tactile cues. Pt did not need assistance to move Les or Ues against gravity. Bed placed in chair position to assess pt upright tolerance, and family arrival for pt to visit with. Overall the patient would benefit from further skilled PT intervention to address function, independence, and mobility. Anticipate HHPT with frequent/constant supervision pending further progress with mobility.        Recommendations for follow up therapy are one component of a multi-disciplinary discharge planning process, led by the attending physician.  Recommendations may be updated based on patient status, additional functional criteria and insurance authorization.  Follow Up Recommendations Home health PT    Assistance Recommended at Discharge Frequent or constant Supervision/Assistance  Patient can return home with the following  A little help with walking and/or transfers;A little help with bathing/dressing/bathroom;Assistance with cooking/housework;Help with stairs or ramp for entrance;Assist for transportation    Equipment Recommendations BSC/3in1 (RW versus rollator, TBD)  Recommendations for Other Services       Functional Status  Assessment Patient has had a recent decline in their functional status and demonstrates the ability to make significant improvements in function in a reasonable and predictable amount of time.     Precautions / Restrictions Precautions Precautions: Fall Restrictions Weight Bearing Restrictions: No      Mobility  Bed Mobility               General bed mobility comments: deferred due to low BP, bed placed in chair position    Transfers                        Ambulation/Gait                  Stairs            Wheelchair Mobility    Modified Rankin (Stroke Patients Only)       Balance                                             Pertinent Vitals/Pain Pain Assessment Pain Assessment: Faces Faces Pain Scale: No hurt    Home Living Family/patient expects to be discharged to:: Private residence Living Arrangements: Children Available Help at Discharge: Family Type of Home: House Home Access: Stairs to enter Entrance Stairs-Rails: Right;Left;Can reach both Entrance Stairs-Number of Steps: 12, or 2   Home Layout: Two level;Able to live on main level with bedroom/bathroom Home Equipment: Shower seat      Prior Function Prior Level of Function : Independent/Modified Independent                     Hand Dominance  Dominant Hand: Right    Extremity/Trunk Assessment   Upper Extremity Assessment Upper Extremity Assessment: Defer to OT evaluation    Lower Extremity Assessment Lower Extremity Assessment: Generalized weakness       Communication   Communication: No difficulties  Cognition Arousal/Alertness: Awake/alert Behavior During Therapy: WFL for tasks assessed/performed Overall Cognitive Status: Within Functional Limits for tasks assessed                                 General Comments: pt emotionally labile during session, needs a lot of comfort/encouragement        General  Comments      Exercises General Exercises - Lower Extremity Ankle Circles/Pumps: AROM, Both, 20 reps Short Arc Quad: AROM, Both, 20 reps, Strengthening Heel Slides: AROM, Strengthening, Both, 20 reps Other Exercises Other Exercises: L and R shoulder flexion x10   Assessment/Plan    PT Assessment Patient needs continued PT services  PT Problem List Decreased strength;Decreased mobility;Decreased activity tolerance;Decreased balance;Pain;Decreased knowledge of use of DME;Decreased knowledge of precautions       PT Treatment Interventions DME instruction;Therapeutic exercise;Gait training;Balance training;Stair training;Neuromuscular re-education;Functional mobility training;Therapeutic activities;Patient/family education    PT Goals (Current goals can be found in the Care Plan section)  Acute Rehab PT Goals Patient Stated Goal: to get up and move PT Goal Formulation: With patient Time For Goal Achievement: 03/17/21 Potential to Achieve Goals: Good    Frequency Min 2X/week     Co-evaluation               AM-PAC PT "6 Clicks" Mobility  Outcome Measure Help needed turning from your back to your side while in a flat bed without using bedrails?: A Little Help needed moving from lying on your back to sitting on the side of a flat bed without using bedrails?: A Little Help needed moving to and from a bed to a chair (including a wheelchair)?: A Little Help needed standing up from a chair using your arms (e.g., wheelchair or bedside chair)?: A Little Help needed to walk in hospital room?: A Little Help needed climbing 3-5 steps with a railing? : A Little 6 Click Score: 18    End of Session Equipment Utilized During Treatment: Gait belt Activity Tolerance: Other (comment) (limited by low BP) Patient left: in bed;with call bell/phone within reach;with bed alarm set Nurse Communication: Mobility status PT Visit Diagnosis: Other abnormalities of gait and mobility  (R26.89);Difficulty in walking, not elsewhere classified (R26.2);Muscle weakness (generalized) (M62.81);Pain Pain - Right/Left: Right Pain - part of body:  (breast/shoulder)    Time: 2774-1287 PT Time Calculation (min) (ACUTE ONLY): 25 min   Charges:   PT Evaluation $PT Eval Low Complexity: 1 Low PT Treatments $Therapeutic Activity: 8-22 mins        Lieutenant Diego PT, DPT 3:42 PM,03/03/21

## 2021-03-03 NOTE — Progress Notes (Signed)
NAME:  Elizabeth [name], MRN:  [id number], DOB:  February 07, 1950, LOS: 1 ADMISSION DATE:  03/02/2021, CONSULTATION DATE:  03/02/21 REFERRING MD:  Dr. Lysle Pearl, CHIEF COMPLAINT:  Breast Cancer & Hypotension  History of Present Illness:  71 yo F presenting to [facility] on 03/02/21 for an elective modified RIGHT radical mastectomy in the setting of breast cancer with lymph node biopsy also positive for mets. Procedure completed without complication. Post operatively patient hypotensive requiring low dose peripheral vasopressor support.  Bedside patient denies any signs/symptoms of recent infection, but does admit to not eating/drinking that much over the past few days.  03/03/21- patient remains with mild hypotension.  We discussed potential need for blood transfusion. Patient is agreeable.   Pertinent  Medical History  Anemia Anxiety & depression Bipolar disorder  Significant Hospital Events: Including procedures, antibiotic start and stop dates in addition to other pertinent events   03/02/21: Patient admitted to ICU post-op on low dose peripheral vasopressor support s/p right radical masectomy  Interim History / Subjective:  Patient alert and oriented, no current complaints except some soreness around the area of her mastectomy.  Vital stable on low-dose peripheral vasopressor  Objective   Blood pressure (!) 102/56, pulse 82, temperature 98 F (36.7 C), temperature source Oral, resp. rate (!) 25, height 5\' 1"  (1.549 m), weight 53.1 kg, SpO2 100 %.        Intake/Output Summary (Last 24 hours) at 03/03/2021 1725 Last data filed at 03/03/2021 1600 Gross per 24 hour  Intake 3839.76 ml  Output 955 ml  Net 2884.76 ml    Filed Weights   03/02/21 0904  Weight: 53.1 kg    Examination: General: Adult female, acutely ill, lying in bed, NAD HEENT: MM pink/moist, anicteric, atraumatic, neck supple, glasses in place Neuro: A&O x 4, able to follow commands, PERRL +3, MAE CV: s1s2 RRR, SBP in 50s on  monitor, no r/m/g Pulm: Regular, non labored on room air, breath sounds clear-BUL & diminished-BLL GI: soft, rounded, non tender, bs x 4 Skin: Surgical site covered with dressing and binder- no rashes/lesions noted Extremities: warm/dry, pulses + 2 R/P, no edema noted  Resolved Hospital Problem list     Assessment & Plan:  Postoperative circulatory shock multifactorial in the setting of anesthesia and suspected hypovolemia from poor p.o. intake Patient received 500 mL LR & started on Neo-Synephrine drip. Hgb stable at 8.3 - Continue LR infusion - Additional LR bolus - Neo-Synephrine changed to norepinephrine, wean as tolerated to maintain MAP >65 -Continuous cardiac monitoring - daily BMP, replace electrolytes PRN - strict I&O's, alert provider if UOP < 0.5 mL/kg/hr  Post-Op Pain management - continue Ibuprofen, Norco/vicodin & tramadol PRN with Dilaudid PRN for severe breakthrough pain per primary service  Hypothyroidism - continue Synthroid   Anxiety/Bipolar disorder - continue Klonopin PRN, per primary service  Hyperlipidemia - continue atorvastatin per primary service  Best Practice (right click and "Reselect all SmartList Selections" daily)  Diet/type: clear liquids DVT prophylaxis: LMWH GI prophylaxis: PPI Lines: N/A Foley:  N/A Code Status:  full code Last date of multidisciplinary goals of care discussion [per primary service-03/02/21]  Labs   CBC: Recent Labs  Lab 02/26/21 1405 03/02/21 2042 03/03/21 0526  WBC 7.6 14.0* 9.2  HGB 9.6* 8.3* 7.7*  HCT 29.7* 27.4* 24.5*  MCV 86.6 93.5 89.1  PLT 380 305 358     Basic Metabolic Panel: Recent Labs  Lab 02/26/21 1405 03/02/21 2042 03/03/21 0526  NA 137  --  140  K 3.8  --  4.0  CL 105  --  109  CO2 25  --  23  GLUCOSE 95  --  160*  BUN 33*  --  19  CREATININE 0.84 0.80 0.93  CALCIUM 8.5*  --  8.0*  MG  --   --  1.9  PHOS  --   --  3.7    GFR: Estimated Creatinine Clearance: 42.5 mL/min (by C-G  formula based on SCr of 0.93 mg/dL). Recent Labs  Lab 02/26/21 1405 03/02/21 2042 03/03/21 0526  WBC 7.6 14.0* 9.2     Liver Function Tests: Recent Labs  Lab 03/03/21 0526  ALBUMIN 2.5*   No results for input(s): LIPASE, AMYLASE in the last 168 hours. No results for input(s): AMMONIA in the last 168 hours.  ABG No results found for: PHART, PCO2ART, PO2ART, HCO3, TCO2, ACIDBASEDEF, O2SAT   Coagulation Profile: No results for input(s): INR, PROTIME in the last 168 hours.  Cardiac Enzymes: No results for input(s): CKTOTAL, CKMB, CKMBINDEX, TROPONINI in the last 168 hours.  HbA1C: No results found for: HGBA1C  CBG: Recent Labs  Lab 03/02/21 1949  GLUCAP 128*     Review of Systems: Positives in BOLD  Gen: Denies fever, chills, weight change, fatigue, night sweats, Right chest soreness over surgical site HEENT: Denies blurred vision, double vision, hearing loss, tinnitus, sinus congestion, rhinorrhea, sore throat, neck stiffness, dysphagia PULM: Denies shortness of breath, cough, sputum production, hemoptysis, wheezing CV: Denies chest pain, edema, orthopnea, paroxysmal nocturnal dyspnea, palpitations GI: Denies abdominal pain, nausea, vomiting, diarrhea, hematochezia, melena, constipation, change in bowel habits GU: Denies dysuria, hematuria, polyuria, oliguria, urethral discharge Endocrine: Denies hot or cold intolerance, polyuria, polyphagia or appetite change Derm: Denies rash, dry skin, scaling or peeling skin change Heme: Denies easy bruising, bleeding, bleeding gums Neuro: Denies headache, numbness, weakness, slurred speech, loss of memory or consciousness  Past Medical History:  She,  has a past medical history of Acute cholecystitis (2016), Anemia, Anemia (2016), Anxiety, Anxiety and depression, Bipolar 1 disorder ([place]), Bowel obstruction ([place]) (2017), Common bile duct dilatation (2015), Dyspnea, Edema, Fibromyalgia, GI bleed (2017), History of kidney stones  (01/21/2009), HSV-1 infection, Hyperglycemia, Hyperlipidemia, Hypokalemia, Hypothyroidism, Liver cyst, Malignant neoplasm of right female breast, unspecified estrogen receptor status, unspecified site of breast ([place]), Memory loss, Osteopenia, Panic disorder, Schizophrenia ([place]), and Ventral hernia.   Surgical History:   Past Surgical History:  Procedure Laterality Date   ABDOMINAL HYSTERECTOMY     BREAST BIOPSY Right 01/19/2021   [place] Bx, ribbon, path pending   BREAST BIOPSY Right 01/19/2021   [place] Bx, Venus, path pending   BREAST BIOPSY Right 01/19/2021   [place] Bx, heart, path pending   BREAST BIOPSY Right 01/19/2021   [place] BX Axilla, Coil, path pending   CESAREAN SECTION     CHOLECYSTECTOMY OPEN  11/17/2013   with repair of bile duct-Dr. Marina Gravel   MASTECTOMY MODIFIED RADICAL Right 03/02/2021   Procedure: MASTECTOMY MODIFIED RADICAL;  Surgeon: Benjamine Sprague, DO;  Location: [facility] ORS;  Service: General;  Laterality: Right;   OPEN REDUCTION INTERNAL FIXATION (ORIF) DISTAL RADIAL FRACTURE Right 12/20/2014   Procedure: OPEN REDUCTION INTERNAL FIXATION (ORIF) RIGHT DISTAL RADIAL FRACTURE;  Surgeon: Renette Butters, MD;  Location: [facility];  Service: Orthopedics;  Laterality: Right;   STOMACH SURGERY       Social History:   reports that she quit smoking about 47 years ago. Her smoking use included cigarettes. She has been  exposed to tobacco smoke. She has never used smokeless tobacco. She reports that she does not drink alcohol and does not use drugs.   Family History:  Her family history includes Asthma in her daughter; Breast cancer in her paternal aunt and paternal grandmother; Heart disease in her father; Kidney disease in her paternal aunt. There is no history of Bladder Cancer.   Allergies Allergies  Allergen Reactions   Penicillins Hives    TOLERATED CEFAZOLIN     Home Medications  Prior to Admission medications   Medication Sig Start Date End Date Taking? Authorizing  Provider  alendronate (FOSAMAX) 70 MG tablet Take 1 tablet by mouth every Monday. 03/09/20  Yes [provider]  atorvastatin (LIPITOR) 20 MG tablet Take 20 mg by mouth daily.   Yes [provider]  clonazePAM (KLONOPIN) 1 MG tablet Take 1 mg by mouth 2 (two) times daily as needed for anxiety.   Yes [provider]  levothyroxine (SYNTHROID, LEVOTHROID) 88 MCG tablet Take 88 mcg by mouth daily before breakfast.   Yes [provider]     Critical care provider statement:   Total critical care time: 33 minutes   Performed by: Lanney Gins MD   Critical care time was exclusive of separately billable procedures and treating other patients.   Critical care was necessary to treat or prevent imminent or life-threatening deterioration.   Critical care was time spent personally by me on the following activities: development of treatment plan with patient and/or surrogate as well as nursing, discussions with consultants, evaluation of patient's response to treatment, examination of patient, obtaining history from patient or surrogate, ordering and performing treatments and interventions, ordering and review of laboratory studies, ordering and review of radiographic studies, pulse oximetry and re-evaluation of patient's condition.    Ottie Glazier, M.D.  Pulmonary & Critical Care Medicine

## 2021-03-04 DIAGNOSIS — F4323 Adjustment disorder with mixed anxiety and depressed mood: Secondary | ICD-10-CM | POA: Diagnosis not present

## 2021-03-04 LAB — MAGNESIUM: Magnesium: 1.9 mg/dL (ref 1.7–2.4)

## 2021-03-04 LAB — PHOSPHORUS: Phosphorus: 2.8 mg/dL (ref 2.5–4.6)

## 2021-03-04 LAB — BASIC METABOLIC PANEL
Anion gap: 4 — ABNORMAL LOW (ref 5–15)
BUN: 18 mg/dL (ref 8–23)
CO2: 24 mmol/L (ref 22–32)
Calcium: 7.7 mg/dL — ABNORMAL LOW (ref 8.9–10.3)
Chloride: 113 mmol/L — ABNORMAL HIGH (ref 98–111)
Creatinine, Ser: 0.84 mg/dL (ref 0.44–1.00)
GFR, Estimated: >60 mL/min (ref >60)
Glucose, Bld: 97 mg/dL (ref 70–99)
Potassium: 3.7 mmol/L (ref 3.5–5.1)
Sodium: 141 mmol/L (ref 135–145)

## 2021-03-04 LAB — CBC
HCT: 25.1 % — ABNORMAL LOW (ref 36.0–46.0)
Hemoglobin: 8.1 g/dL — ABNORMAL LOW (ref 12.0–15.0)
MCH: 29.6 pg (ref 26.0–34.0)
MCHC: 32.3 g/dL (ref 30.0–36.0)
MCV: 91.6 fL (ref 80.0–100.0)
Platelets: 262 10*3/uL (ref 150–400)
RBC: 2.74 MIL/uL — ABNORMAL LOW (ref 3.87–5.11)
RDW: 14.8 % (ref 11.5–15.5)
WBC: 6.8 10*3/uL (ref 4.0–10.5)
nRBC: 0 % (ref 0.0–0.2)

## 2021-03-04 NOTE — Progress Notes (Signed)
Patient moved by wheelchair to room 204 with this RN. Alert with no distress noted sitting up in bedside chair watching super bowl at this time. [place], RN at bedside.

## 2021-03-04 NOTE — Progress Notes (Addendum)
Physical Therapy Treatment Patient Details Name: Elizabeth [name] MRN: [id number] DOB: Jun 02, 1950 Today's Date: 03/04/2021   History of Present Illness Pt is a 71 yo female s/p R radical masectomy, transferred to ICU after due to hypotension. PMH of hx of hyperlipidemia, hypothyroidism, anxiety, breast cancer, fibromyalgia, schizophrenia, and bipolar disorder.    PT Comments    Pt finishing with OT upon arrival.  Feeling good and wants to keep walking.  She is able to stand and walk x 2 laps on ICU unit with no AD.  She takes short shuffling steps but generally steady.  She does voice continued anger over need for mastectomy and listening and encouragement are given.   She is interested in rollator walker if insurance pays for it.  Has not had a walker in the past.  She would like look into one her at discharge but if insurance does not pay she would prefer to purchase on her own.    Recommendations for follow up therapy are one component of a multi-disciplinary discharge planning process, led by the attending physician.  Recommendations may be updated based on patient status, additional functional criteria and insurance authorization.  Follow Up Recommendations  Home health PT     Assistance Recommended at Discharge Intermittent Supervision/Assistance  Patient can return home with the following A little help with walking and/or transfers;A little help with bathing/dressing/bathroom;Assistance with cooking/housework;Help with stairs or ramp for entrance;Assist for transportation   Equipment Recommendations  Rollator (4 wheels)    Recommendations for Other Services       Precautions / Restrictions Precautions Precautions: Fall Restrictions Weight Bearing Restrictions: No     Mobility  Bed Mobility               General bed mobility comments: up in chair with OT prior    Transfers Overall transfer level: Needs assistance Equipment used: None Transfers: Sit to/from  Stand Sit to Stand: Supervision                Ambulation/Gait Ambulation/Gait assistance: Supervision, Min guard Gait Distance (Feet): 300 Feet Assistive device: None Gait Pattern/deviations: Step-through pattern Gait velocity: decreased     General Gait Details: short steps with dec step height but overall steady   Stairs             Wheelchair Mobility    Modified Rankin (Stroke Patients Only)       Balance Overall balance assessment: Needs assistance Sitting-balance support: Feet supported Sitting balance-Leahy Scale: Good     Standing balance support: No upper extremity supported Standing balance-Leahy Scale: Fair                              Cognition Arousal/Alertness: Awake/alert Behavior During Therapy: WFL for tasks assessed/performed Overall Cognitive Status: Within Functional Limits for tasks assessed                                 General Comments: pt crying at times and voices anger over mastectomy "my children made me"  encouragement given        Exercises      General Comments        Pertinent Vitals/Pain Pain Assessment Pain Assessment: No/denies pain    Home Living  Prior Function            PT Goals (current goals can now be found in the care plan section) Progress towards PT goals: Progressing toward goals    Frequency    Min 2X/week      PT Plan Current plan remains appropriate    Co-evaluation              AM-PAC PT "6 Clicks" Mobility   Outcome Measure  Help needed turning from your back to your side while in a flat bed without using bedrails?: A Little Help needed moving from lying on your back to sitting on the side of a flat bed without using bedrails?: A Little Help needed moving to and from a bed to a chair (including a wheelchair)?: A Little Help needed standing up from a chair using your arms (e.g., wheelchair or bedside  chair)?: None Help needed to walk in hospital room?: A Little Help needed climbing 3-5 steps with a railing? : A Little 6 Click Score: 19    End of Session Equipment Utilized During Treatment: Gait belt Activity Tolerance: Patient tolerated treatment well Patient left: in chair;with call bell/phone within reach;with chair alarm set Nurse Communication: Mobility status PT Visit Diagnosis: Other abnormalities of gait and mobility (R26.89);Difficulty in walking, not elsewhere classified (R26.2);Muscle weakness (generalized) (M62.81);Pain Pain - Right/Left: Right     Time: 7106-2694 PT Time Calculation (min) (ACUTE ONLY): 10 min  Charges:  $Gait Training: 8-22 mins                    Chesley Noon, PTA 03/04/21, 10:55 AM

## 2021-03-04 NOTE — Progress Notes (Signed)
Report called to [place], RN on [place]. Patient will be moved to room 204.

## 2021-03-04 NOTE — Plan of Care (Signed)
  Problem: Clinical Measurements: Goal: Ability to maintain clinical measurements within normal limits will improve Outcome: Progressing Goal: Diagnostic test results will improve Outcome: Progressing   

## 2021-03-04 NOTE — Consult Note (Signed)
[facility] Consult   Reason for Consult: depression and anxiety Referring Physician:  Dr Lysle Pearl Patient Identification: Elizabeth [name] MRN:  [id number] Principal Diagnosis: Breast cancer [facility]) Diagnosis:  Principal Problem:   Breast cancer ([place]) Active Problems:   Adjustment disorder with mixed anxiety and depressed mood   Shock ([place])  Total Time spent with patient: 1 hour  Subjective:   Elizabeth [name] is a 71 y.o. female   HPI:   Subjective:   Elizabeth [name] is a 71 y.o. female patient admitted for a mastectomy.  "I just lost a breast, very emotional.  I didn't want to take the breast, my family did."  She reports being emotional along with stress of having her 9 yo stepdaughter and her friend move in with their 3 dogs until their place is painted.  She has had several losses with the death of her dog of 10 years a few months ago and her boyfriend moving out 5 years ago to care for his parents along with her husband passing in 2007.  She feels pressure to get things done to "fix up" her house that is older and not having the strength to do anything, her son is agreement to renovate the house.  She wants to throw out all or most of her things and start "fresh".  The client admits to "being very ugly to my son and family" along with friends; does not expand on the reason.  She stopped taking her medications a few months ago, medical medications and Klonopin, and does not want to restart the Klonopin which is not recommended related to the negative side effects and dependency issue.  Recommended Lexapro but she wants to wait, "until I talk to a therapist first."  She does have a support system with her stepdaughter coming to watch the Super bowl with her.  Denies suicidal/homicidal ideations, hallucinations, or substance abuse.  Past Psychiatric History:  Hx of depression and anxiety  Risk to Self:  none Risk to Others:  none Prior Inpatient Therapy: none  Prior  Outpatient Therapy:  none  Past Medical History:  Past Medical History:  Diagnosis Date   Acute cholecystitis 2016   Anemia    Anemia 2016   Anxiety    panic attacks   Anxiety and depression    Bipolar 1 disorder (HCC)    Bowel obstruction ([place]) 2017   Common bile duct dilatation 2015   Dyspnea    Edema    Fibromyalgia    GI bleed 2017   History of kidney stones 01/21/2009   HSV-1 infection    Hyperglycemia    Hyperlipidemia    Hypokalemia    Hypothyroidism    Liver cyst    Malignant neoplasm of right female breast, unspecified estrogen receptor status, unspecified site of breast ([place])    Memory loss    Osteopenia    Panic disorder    Schizophrenia ([place])    Ventral hernia     Past Surgical History:  Procedure Laterality Date   ABDOMINAL HYSTERECTOMY     BREAST BIOPSY Right 01/19/2021   [place] Bx, ribbon, path pending   BREAST BIOPSY Right 01/19/2021   [place] Bx, Venus, path pending   BREAST BIOPSY Right 01/19/2021   [place] Bx, heart, path pending   BREAST BIOPSY Right 01/19/2021   [place] BX Axilla, Coil, path pending   CESAREAN SECTION     CHOLECYSTECTOMY OPEN  11/17/2013   with repair of bile duct-Dr. Marina Gravel   MASTECTOMY  MODIFIED RADICAL Right 03/02/2021   Procedure: MASTECTOMY MODIFIED RADICAL;  Surgeon: Benjamine Sprague, DO;  Location: [facility] ORS;  Service: General;  Laterality: Right;   OPEN REDUCTION INTERNAL FIXATION (ORIF) DISTAL RADIAL FRACTURE Right 12/20/2014   Procedure: OPEN REDUCTION INTERNAL FIXATION (ORIF) RIGHT DISTAL RADIAL FRACTURE;  Surgeon: Renette Butters, MD;  Location: [place];  Service: Orthopedics;  Laterality: Right;   STOMACH SURGERY     Family History:  Family History  Problem Relation Age of Onset   Heart disease Father    Breast cancer Paternal Grandmother    Asthma Daughter    Breast cancer Paternal Aunt    Kidney disease Paternal Aunt    Bladder Cancer Neg Hx    Family Psychiatric  History: none Social History:  Social History    Substance and Sexual Activity  Alcohol Use No   Alcohol/week: 0.0 standard drinks     Social History   Substance and Sexual Activity  Drug Use No    Social History   Socioeconomic History   Marital status: Married    Spouse name: Not on file   Number of children: Not on file   Years of education: Not on file   Highest education level: Not on file  Occupational History   Not on file  Tobacco Use   Smoking status: Former    Packs/day: 0.00    Years: 0.00    Pack years: 0.00    Types: Cigarettes    Quit date: 11/03/1973    Years since quitting: 47.3    Passive exposure: Past   Smokeless tobacco: Never   Tobacco comments:    Smoked briefly as a teen- lives in secondhand smoke.   Vaping Use   Vaping Use: Never used  Substance and Sexual Activity   Alcohol use: No    Alcohol/week: 0.0 standard drinks   Drug use: No   Sexual activity: Not on file  Other Topics Concern   Not on file  Social History Narrative   Not on file   Social Determinants of Health   Financial Resource Strain: Not on file  Food Insecurity: Not on file  Transportation Needs: Not on file  Physical Activity: Not on file  Stress: Not on file  Social Connections: Not on file   Additional Social History:    Allergies:   Allergies  Allergen Reactions   Penicillins Hives    TOLERATED CEFAZOLIN    Labs:  Results for orders placed or performed during the hospital encounter of 03/02/21 (from the past 48 hour(s))  Glucose, capillary     Status: Abnormal   Collection Time: 03/02/21  7:49 PM  Result Value Ref Range   Glucose-Capillary 128 (H) 70 - 99 mg/dL    Comment: Glucose reference range applies only to samples taken after fasting for at least 8 hours.   Comment 1 Notify RN    Comment 2 Document in Chart   CBC     Status: Abnormal   Collection Time: 03/02/21  8:42 PM  Result Value Ref Range   WBC 14.0 (H) 4.0 - 10.5 K/uL   RBC 2.93 (L) 3.87 - 5.11 MIL/uL   Hemoglobin 8.3 (L) 12.0 -  15.0 g/dL   HCT 27.4 (L) 36.0 - 46.0 %   MCV 93.5 80.0 - 100.0 fL   MCH 28.3 26.0 - 34.0 pg   MCHC 30.3 30.0 - 36.0 g/dL   RDW 14.7 11.5 - 15.5 %   Platelets 305 150 -  400 K/uL   nRBC 0.0 0.0 - 0.2 %    Comment: Performed at [facility], [address] [place], [place] [address]  Creatinine, serum     Status: None   Collection Time: 03/02/21  8:42 PM  Result Value Ref Range   Creatinine, Ser 0.80 0.44 - 1.00 mg/dL   GFR, Estimated >60 >60 mL/min    Comment: (NOTE) Calculated using the CKD-EPI Creatinine Equation (2021) Performed at [facility], [address] [place], [place] [address]   CBC     Status: Abnormal   Collection Time: 03/03/21  5:26 AM  Result Value Ref Range   WBC 9.2 4.0 - 10.5 K/uL   RBC 2.75 (L) 3.87 - 5.11 MIL/uL   Hemoglobin 7.7 (L) 12.0 - 15.0 g/dL   HCT 24.5 (L) 36.0 - 46.0 %   MCV 89.1 80.0 - 100.0 fL   MCH 28.0 26.0 - 34.0 pg   MCHC 31.4 30.0 - 36.0 g/dL   RDW 14.8 11.5 - 15.5 %   Platelets 358 150 - 400 K/uL   nRBC 0.0 0.0 - 0.2 %    Comment: Performed at [facility], [address] [place], [place] [address]  Basic metabolic panel     Status: Abnormal   Collection Time: 03/03/21  5:26 AM  Result Value Ref Range   Sodium 140 135 - 145 mmol/L   Potassium 4.0 3.5 - 5.1 mmol/L   Chloride 109 98 - 111 mmol/L   CO2 23 22 - 32 mmol/L   Glucose, Bld 160 (H) 70 - 99 mg/dL    Comment: Glucose reference range applies only to samples taken after fasting for at least 8 hours.   BUN 19 8 - 23 mg/dL   Creatinine, Ser 0.93 0.44 - 1.00 mg/dL   Calcium 8.0 (L) 8.9 - 10.3 mg/dL   GFR, Estimated >60 >60 mL/min    Comment: (NOTE) Calculated using the CKD-EPI Creatinine Equation (2021)    Anion gap 8 5 - 15    Comment: Performed at [facility], [address] [place], [place] [address]  Magnesium     Status: None   Collection Time: 03/03/21  5:26 AM  Result Value Ref Range   Magnesium 1.9 1.7 - 2.4 mg/dL     Comment: Performed at [facility], [address] [place], [place] [address]  Phosphorus     Status: None   Collection Time: 03/03/21  5:26 AM  Result Value Ref Range   Phosphorus 3.7 2.5 - 4.6 mg/dL    Comment: Performed at [facility], [address] [place], [place] [address]  Albumin     Status: Abnormal   Collection Time: 03/03/21  5:26 AM  Result Value Ref Range   Albumin 2.5 (L) 3.5 - 5.0 g/dL    Comment: Performed at [facility], [address] [place], [place] [address]  Hemoglobin and hematocrit, blood     Status: Abnormal   Collection Time: 03/03/21  6:45 PM  Result Value Ref Range   Hemoglobin 7.3 (L) 12.0 - 15.0 g/dL   HCT 23.1 (L) 36.0 - 46.0 %    Comment: Performed at [facility], [address], [place], [place] [address]  ABO/Rh     Status: None   Collection Time: 03/03/21  6:45 PM  Result Value Ref Range   ABO/RH(D)      O POS Performed at [facility], [place], [place]  [address]   Prepare RBC (crossmatch)     Status: None   Collection Time: 03/03/21  7:42 PM  Result Value Ref Range   Order Confirmation      ORDER PROCESSED BY BLOOD BANK Performed at [facility], [address] [place], [place] [address]   Type and screen [place]     Status: None (Preliminary result)   Collection Time: 03/03/21  8:30 PM  Result Value Ref Range   ABO/RH(D) O POS    Antibody Screen NEG    Sample Expiration 03/06/2021,2359    Unit Number [id number]    Blood Component Type RED CELLS,LR    Unit division 00    Status of Unit ISSUED    Transfusion Status OK TO TRANSFUSE    Crossmatch Result      Compatible Performed at [facility] [address] [place], [place] [address]   CBC     Status: Abnormal   Collection Time: 03/04/21  5:50 AM  Result Value Ref Range   WBC 6.8 4.0 - 10.5 K/uL   RBC 2.74 (L) 3.87 - 5.11 MIL/uL   Hemoglobin 8.1 (L) 12.0 -  15.0 g/dL   HCT 25.1 (L) 36.0 - 46.0 %   MCV 91.6 80.0 - 100.0 fL   MCH 29.6 26.0 - 34.0 pg   MCHC 32.3 30.0 - 36.0 g/dL   RDW 14.8 11.5 - 15.5 %   Platelets 262 150 - 400 K/uL   nRBC 0.0 0.0 - 0.2 %    Comment: Performed at [facility], [address] [place], [place] [address]  Basic metabolic panel     Status: Abnormal   Collection Time: 03/04/21  5:50 AM  Result Value Ref Range   Sodium 141 135 - 145 mmol/L   Potassium 3.7 3.5 - 5.1 mmol/L   Chloride 113 (H) 98 - 111 mmol/L   CO2 24 22 - 32 mmol/L   Glucose, Bld 97 70 - 99 mg/dL    Comment: Glucose reference range applies only to samples taken after fasting for at least 8 hours.   BUN 18 8 - 23 mg/dL   Creatinine, Ser 0.84 0.44 - 1.00 mg/dL   Calcium 7.7 (L) 8.9 - 10.3 mg/dL   GFR, Estimated >60 >60 mL/min    Comment: (NOTE) Calculated using the CKD-EPI Creatinine Equation (2021)    Anion gap 4 (L) 5 - 15    Comment: Performed at [facility], [address] [place], [place] [address]  Magnesium     Status: None   Collection Time: 03/04/21  5:50 AM  Result Value Ref Range   Magnesium 1.9 1.7 - 2.4 mg/dL    Comment: Performed at [facility], [address], [place], [place] [address]  Phosphorus     Status: None   Collection Time: 03/04/21  5:50 AM  Result Value Ref Range   Phosphorus 2.8 2.5 - 4.6 mg/dL    Comment: Performed at [facility], [address] [place], [place] [address]    Current Facility-Administered Medications  Medication Dose Route Frequency Provider Last Rate Last Admin   0.9 %  sodium chloride infusion  250 mL Intravenous Continuous Rust-Chester, Huel Cote, NP       acetaminophen (TYLENOL) tablet 650 mg  650 mg Oral Q6H PRN Sakai, Isami, DO       atorvastatin (LIPITOR) tablet 20 mg  20 mg Oral Daily Sakai, Isami, DO   20 mg at  03/04/21 0903   celecoxib (CELEBREX) capsule 200 mg  200 mg Oral BID Lysle Pearl, Isami, DO   200 mg at 03/04/21 7672   Chlorhexidine  Gluconate Cloth 2 % PADS 6 each  6 each Topical Daily Lysle Pearl, Isami, DO   6 each at 03/04/21 0904   clonazePAM (KLONOPIN) tablet 1 mg  1 mg Oral BID PRN Lysle Pearl, Isami, DO   1 mg at 03/02/21 2141   docusate sodium (COLACE) capsule 100 mg  100 mg Oral BID PRN Rust-Chester, Huel Cote, NP       enoxaparin (LOVENOX) injection 40 mg  40 mg Subcutaneous Q24H Sakai, Isami, DO   40 mg at 03/04/21 0947   gabapentin (NEURONTIN) capsule 300 mg  300 mg Oral BID Sakai, Isami, DO       HYDROcodone-acetaminophen (NORCO/VICODIN) 5-325 MG per tablet 1-2 tablet  1-2 tablet Oral Q6H PRN Lysle Pearl, Isami, DO       HYDROmorphone (DILAUDID) injection 0.5 mg  0.5 mg Intravenous Q3H PRN Sakai, Isami, DO       ibuprofen (ADVIL) tablet 600 mg  600 mg Oral Q6H PRN Sakai, Isami, DO       levothyroxine (SYNTHROID) tablet 88 mcg  88 mcg Oral QAC breakfast Sakai, Isami, DO   88 mcg at 03/04/21 0516   ondansetron (ZOFRAN-ODT) disintegrating tablet 4 mg  4 mg Oral Q6H PRN Lysle Pearl, Isami, DO       Or   ondansetron [facility]) injection 4 mg  4 mg Intravenous Q6H PRN Sakai, Isami, DO   4 mg at 03/03/21 0450   pantoprazole (PROTONIX) injection 40 mg  40 mg Intravenous QHS Sakai, Isami, DO   40 mg at 03/03/21 2222   polyethylene glycol (MIRALAX / GLYCOLAX) packet 17 g  17 g Oral Daily PRN Rust-Chester, Toribio Harbour L, NP   17 g at 03/04/21 0904   traMADol (ULTRAM) tablet 50 mg  50 mg Oral Q6H PRN Benjamine Sprague, DO        Musculoskeletal: Strength & Muscle Tone: decreased Gait & Station:  did not witness Patient leans: N/A  Psychiatric Specialty Exam: Physical Exam Vitals and nursing note reviewed.  Constitutional:      Appearance: Normal appearance.  HENT:     Head: Normocephalic.  Pulmonary:     Effort: Pulmonary effort is normal.  Musculoskeletal:        General: Normal range of motion.     Cervical back: Normal range of motion.  Neurological:     General: No focal deficit present.     Mental Status: She is alert and oriented to  person, place, and time.  Psychiatric:        Attention and Perception: Attention and perception normal.        Mood and Affect: Mood is anxious and depressed.        Speech: Speech normal.        Behavior: Behavior normal. Behavior is cooperative.        Thought Content: Thought content normal.        Cognition and Memory: Cognition and memory normal.        Judgment: Judgment normal.    Review of Systems  Constitutional:  Positive for malaise/fatigue.  Psychiatric/Behavioral:  Positive for depression. The patient is nervous/anxious.   All other systems reviewed and are negative.  Blood pressure (!) 105/58, pulse 81, temperature 98.1 F (36.7 C), temperature source Oral, resp. rate 20, height 5\' 1"  (1.549 m), weight 53.1 kg, SpO2 99 %.Body mass  index is 22.12 kg/m.  General Appearance: Casual  Eye Contact:  Good  Speech:  Clear and Coherent  Volume:  Normal  Mood:  Anxious and Depressed  Affect:  Congruent  Thought Process:  Coherent  Orientation:  Full (Time, Place, and Person)  Thought Content:  WDL and Logical  Suicidal Thoughts:  No  Homicidal Thoughts:  No  Memory:  Immediate;   Good Recent;   Good Remote;   Good  Judgement:  Fair  Insight:  Fair  Psychomotor Activity:  Normal  Concentration:  Concentration: Good and Attention Span: Good  Recall:  Good  Fund of Knowledge:  Good  Language:  Good  Akathisia:  No  Handed:  Right  AIMS (if indicated):     Assets:  Housing Leisure Time Physical Health Resilience Social Support  ADL's:  Intact  Cognition:  WNL  Sleep:        Physical Exam: Physical Exam Vitals and nursing note reviewed.  Constitutional:      Appearance: Normal appearance.  HENT:     Head: Normocephalic.  Pulmonary:     Effort: Pulmonary effort is normal.  Musculoskeletal:        General: Normal range of motion.     Cervical back: Normal range of motion.  Neurological:     General: No focal deficit present.     Mental Status: She is  alert and oriented to person, place, and time.  Psychiatric:        Attention and Perception: Attention and perception normal.        Mood and Affect: Mood is anxious and depressed.        Speech: Speech normal.        Behavior: Behavior normal. Behavior is cooperative.        Thought Content: Thought content normal.        Cognition and Memory: Cognition and memory normal.        Judgment: Judgment normal.   Review of Systems  Constitutional:  Positive for malaise/fatigue.  Psychiatric/Behavioral:  Positive for depression. The patient is nervous/anxious.   All other systems reviewed and are negative. Blood pressure (!) 105/58, pulse 81, temperature 98.1 F (36.7 C), temperature source Oral, resp. rate 20, height 5\' 1"  (1.549 m), weight 53.1 kg, SpO2 99 %. Body mass index is 22.12 kg/m.  Treatment Plan Summary: Adjustment disorder with mixed disturbance of emotions and conduct: Resources for therapist in discharge instructions Declined medications until she meets with a therapist  Disposition: No evidence of imminent risk to self or others at present.   Patient does not meet criteria for psychiatric inpatient admission. Supportive therapy provided about ongoing stressors.  Waylan Boga, NP 03/04/2021 3:33 PM

## 2021-03-04 NOTE — Progress Notes (Signed)
NAME:  Elizabeth [name], MRN:  [id number], DOB:  12-Aug-1950, LOS: 2 ADMISSION DATE:  03/02/2021, CONSULTATION DATE:  03/02/21 REFERRING MD:  Dr. Lysle Pearl, CHIEF COMPLAINT:  Breast Cancer & Hypotension  History of Present Illness:  71 yo F presenting to [facility] on 03/02/21 for an elective modified RIGHT radical mastectomy in the setting of breast cancer with lymph node biopsy also positive for mets. Procedure completed without complication. Post operatively patient hypotensive requiring low dose peripheral vasopressor support.  Bedside patient denies any signs/symptoms of recent infection, but does admit to not eating/drinking that much over the past few days.  03/03/21- patient remains with mild hypotension.  We discussed potential need for blood transfusion. Patient is agreeable.   03/04/21- patient is improved , she is off vasopressor support.    Pertinent  Medical History  Anemia Anxiety & depression Bipolar disorder  Significant Hospital Events: Including procedures, antibiotic start and stop dates in addition to other pertinent events   03/02/21: Patient admitted to ICU post-op on low dose peripheral vasopressor support s/p right radical masectomy   Objective   Blood pressure 112/62, pulse 76, temperature 97.6 F (36.4 C), temperature source Axillary, resp. rate (!) 25, height 5\' 1"  (1.549 m), weight 53.1 kg, SpO2 97 %.        Intake/Output Summary (Last 24 hours) at 03/04/2021 1047 Last data filed at 03/04/2021 0700 Gross per 24 hour  Intake 3589.24 ml  Output 65 ml  Net 3524.24 ml    Filed Weights   03/02/21 0904  Weight: 53.1 kg    Examination: General: Adult female, NAD HEENT: MM pink/moist, anicteric, atraumatic, neck supple, glasses in place Neuro: A&O x 4, able to follow commands, PERRL +3, MAE CV: s1s2 RRR, SBP in 50s on monitor, no r/m/g Pulm: Regular, non labored on room air, breath sounds clear-BUL & diminished-BLL GI: soft, rounded, non tender, bs x 4 Skin:  Surgical site covered with dressing and binder- no rashes/lesions noted Extremities: warm/dry, pulses + 2 R/P, no edema noted  Resolved Hospital Problem list     Assessment & Plan:  Postoperative circulatory shock multifactorial in the setting of anesthesia and suspected hypovolemia from poor p.o. intake -s/p IVF and blood with improvement now more stable for transfer to medical floor.   Post-Op Pain management - continue Ibuprofen, Norco/vicodin & tramadol PRN with Dilaudid PRN for severe breakthrough pain per primary service  Hypothyroidism - continue Synthroid   Anxiety/Bipolar disorder - continue Klonopin PRN, per primary service  Hyperlipidemia - continue atorvastatin per primary service  Best Practice (right click and "Reselect all SmartList Selections" daily)  Diet/type: clear liquids DVT prophylaxis: LMWH GI prophylaxis: PPI Lines: N/A Foley:  N/A Code Status:  full code Last date of multidisciplinary goals of care discussion [per primary service-03/02/21]  Labs   CBC: Recent Labs  Lab 02/26/21 1405 03/02/21 2042 03/03/21 0526 03/03/21 1845 03/04/21 0550  WBC 7.6 14.0* 9.2  --  6.8  HGB 9.6* 8.3* 7.7* 7.3* 8.1*  HCT 29.7* 27.4* 24.5* 23.1* 25.1*  MCV 86.6 93.5 89.1  --  91.6  PLT 380 305 358  --  262     Basic Metabolic Panel: Recent Labs  Lab 02/26/21 1405 03/02/21 2042 03/03/21 0526 03/04/21 0550  NA 137  --  140 141  K 3.8  --  4.0 3.7  CL 105  --  109 113*  CO2 25  --  23 24  GLUCOSE 95  --  160* 97  BUN 33*  --  19 18  CREATININE 0.84 0.80 0.93 0.84  CALCIUM 8.5*  --  8.0* 7.7*  MG  --   --  1.9 1.9  PHOS  --   --  3.7 2.8    GFR: Estimated Creatinine Clearance: 47 mL/min (by C-G formula based on SCr of 0.84 mg/dL). Recent Labs  Lab 02/26/21 1405 03/02/21 2042 03/03/21 0526 03/04/21 0550  WBC 7.6 14.0* 9.2 6.8     Liver Function Tests: Recent Labs  Lab 03/03/21 0526  ALBUMIN 2.5*    No results for input(s): LIPASE,  AMYLASE in the last 168 hours. No results for input(s): AMMONIA in the last 168 hours.  ABG No results found for: PHART, PCO2ART, PO2ART, HCO3, TCO2, ACIDBASEDEF, O2SAT   Coagulation Profile: No results for input(s): INR, PROTIME in the last 168 hours.  Cardiac Enzymes: No results for input(s): CKTOTAL, CKMB, CKMBINDEX, TROPONINI in the last 168 hours.  HbA1C: No results found for: HGBA1C  CBG: Recent Labs  Lab 03/02/21 1949  GLUCAP 128*     Review of Systems: Positives in BOLD  Gen: Denies fever, chills, weight change, fatigue, night sweats, Right chest soreness over surgical site HEENT: Denies blurred vision, double vision, hearing loss, tinnitus, sinus congestion, rhinorrhea, sore throat, neck stiffness, dysphagia PULM: Denies shortness of breath, cough, sputum production, hemoptysis, wheezing CV: Denies chest pain, edema, orthopnea, paroxysmal nocturnal dyspnea, palpitations GI: Denies abdominal pain, nausea, vomiting, diarrhea, hematochezia, melena, constipation, change in bowel habits GU: Denies dysuria, hematuria, polyuria, oliguria, urethral discharge Endocrine: Denies hot or cold intolerance, polyuria, polyphagia or appetite change Derm: Denies rash, dry skin, scaling or peeling skin change Heme: Denies easy bruising, bleeding, bleeding gums Neuro: Denies headache, numbness, weakness, slurred speech, loss of memory or consciousness  Past Medical History:  She,  has a past medical history of Acute cholecystitis (2016), Anemia, Anemia (2016), Anxiety, Anxiety and depression, Bipolar 1 disorder ([place]), Bowel obstruction ([place]) (2017), Common bile duct dilatation (2015), Dyspnea, Edema, Fibromyalgia, GI bleed (2017), History of kidney stones (01/21/2009), HSV-1 infection, Hyperglycemia, Hyperlipidemia, Hypokalemia, Hypothyroidism, Liver cyst, Malignant neoplasm of right female breast, unspecified estrogen receptor status, unspecified site of breast ([place]), Memory loss,  Osteopenia, Panic disorder, Schizophrenia ([place]), and Ventral hernia.   Surgical History:   Past Surgical History:  Procedure Laterality Date   ABDOMINAL HYSTERECTOMY     BREAST BIOPSY Right 01/19/2021   [place] Bx, ribbon, path pending   BREAST BIOPSY Right 01/19/2021   [place] Bx, Venus, path pending   BREAST BIOPSY Right 01/19/2021   [place] Bx, heart, path pending   BREAST BIOPSY Right 01/19/2021   [place] BX Axilla, Coil, path pending   CESAREAN SECTION     CHOLECYSTECTOMY OPEN  11/17/2013   with repair of bile duct-Dr. Marina Gravel   MASTECTOMY MODIFIED RADICAL Right 03/02/2021   Procedure: MASTECTOMY MODIFIED RADICAL;  Surgeon: Benjamine Sprague, DO;  Location: [facility] ORS;  Service: General;  Laterality: Right;   OPEN REDUCTION INTERNAL FIXATION (ORIF) DISTAL RADIAL FRACTURE Right 12/20/2014   Procedure: OPEN REDUCTION INTERNAL FIXATION (ORIF) RIGHT DISTAL RADIAL FRACTURE;  Surgeon: Renette Butters, MD;  Location: [facility];  Service: Orthopedics;  Laterality: Right;   STOMACH SURGERY       Social History:   reports that she quit smoking about 47 years ago. Her smoking use included cigarettes. She has been exposed to tobacco smoke. She has never used smokeless tobacco. She reports that she does not drink alcohol and does not use drugs.   Family  History:  Her family history includes Asthma in her daughter; Breast cancer in her paternal aunt and paternal grandmother; Heart disease in her father; Kidney disease in her paternal aunt. There is no history of Bladder Cancer.   Allergies Allergies  Allergen Reactions   Penicillins Hives    TOLERATED CEFAZOLIN     Home Medications  Prior to Admission medications   Medication Sig Start Date End Date Taking? Authorizing Provider  alendronate (FOSAMAX) 70 MG tablet Take 1 tablet by mouth every Monday. 03/09/20  Yes [provider]  atorvastatin (LIPITOR) 20 MG tablet Take 20 mg by mouth daily.   Yes [provider]  clonazePAM  (KLONOPIN) 1 MG tablet Take 1 mg by mouth 2 (two) times daily as needed for anxiety.   Yes [provider]  levothyroxine (SYNTHROID, LEVOTHROID) 88 MCG tablet Take 88 mcg by mouth daily before breakfast.   Yes [provider]     Critical care provider statement:   Total critical care time: 33 minutes   Performed by: Lanney Gins MD   Critical care time was exclusive of separately billable procedures and treating other patients.   Critical care was necessary to treat or prevent imminent or life-threatening deterioration.   Critical care was time spent personally by me on the following activities: development of treatment plan with patient and/or surrogate as well as nursing, discussions with consultants, evaluation of patient's response to treatment, examination of patient, obtaining history from patient or surrogate, ordering and performing treatments and interventions, ordering and review of laboratory studies, ordering and review of radiographic studies, pulse oximetry and re-evaluation of patient's condition.    Ottie Glazier, M.D.  Pulmonary & Critical Care Medicine

## 2021-03-04 NOTE — Evaluation (Signed)
Occupational Therapy Evaluation Patient Details Name: Elizabeth [name] MRN: [id number] DOB: 11-16-1950 Today's Date: 03/04/2021   History of Present Illness Pt is a 71 yo female s/p R radical masectomy, transferred to ICU after due to hypotension. PMH of hx of hyperlipidemia, hypothyroidism, anxiety, breast cancer, fibromyalgia, schizophrenia, and bipolar disorder.   Clinical Impression   Pt seen for OT evaluation this date. Upon arrival to room, pt awake and sitting upright in bed with daughter and son present. Pt A&Ox4 and agreeable to OT tx. Prior to admission, pt was independent (without AD) for functional mobility, was independent for most ADLs (with exception of LB dressing/bathing), and was living in a 2-level home with her daughter. Pt's daughter reports that pt has had 2 recent falls (one tripping over curb and one tripping while getting into elevated bed). Pt currently requires MIN GUARD for functional mobility of household distances (11ft) with RW and SUPERVISION for standing hand hygiene. Pt left sitting upright in recliner with PT present. Pt would benefit from additional skilled OT services to maximize return to PLOF and minimize risk of future falls, injury, caregiver burden, and readmission. Upon discharge, recommend [place] services.         Recommendations for follow up therapy are one component of a multi-disciplinary discharge planning process, led by the attending physician.  Recommendations may be updated based on patient status, additional functional criteria and insurance authorization.   Follow Up Recommendations  Home health OT    Assistance Recommended at Discharge Frequent or constant Supervision/Assistance  Patient can return home with the following A little help with walking and/or transfers;A lot of help with bathing/dressing/bathroom;Assistance with cooking/housework;Direct supervision/assist for medications management;Help with stairs or ramp for entrance     Functional Status Assessment  Patient has had a recent decline in their functional status and demonstrates the ability to make significant improvements in function in a reasonable and predictable amount of time.  Equipment Recommendations  None recommended by OT       Precautions / Restrictions Precautions Precautions: Fall Restrictions Weight Bearing Restrictions: No      Mobility Bed Mobility Overal bed mobility: Modified Independent             General bed mobility comments: With HOB elevated, pt required no physical assistance to perform supine>sit transfer    Transfers Overall transfer level: Needs assistance Equipment used: Rolling walker (2 wheels) Transfers: Sit to/from Stand Sit to Stand: Supervision                  Balance Overall balance assessment: Needs assistance Sitting-balance support: No upper extremity supported, Feet supported Sitting balance-Leahy Scale: Good Sitting balance - Comments: good sitting balance reaching without BOS   Standing balance support: No upper extremity supported, During functional activity Standing balance-Leahy Scale: Fair Standing balance comment: Requires SUPERVISION for standing grooming tasks                           ADL either performed or assessed with clinical judgement   ADL Overall ADL's : Needs assistance/impaired     Grooming: Wash/dry hands;Supervision/safety;Standing                               Functional mobility during ADLs: Min guard (to walk 167ft)       Vision Baseline Vision/History: 1 Wears glasses Ability to See in Adequate Light: 0 Adequate Patient Visual Report:  No change from baseline              Pertinent Vitals/Pain Pain Assessment Pain Assessment: No/denies pain     Hand Dominance Right   Extremity/Trunk Assessment Upper Extremity Assessment Upper Extremity Assessment: Generalized weakness   Lower Extremity Assessment Lower Extremity  Assessment: Generalized weakness       Communication Communication Communication: No difficulties   Cognition Arousal/Alertness: Awake/alert Behavior During Therapy: WFL for tasks assessed/performed Overall Cognitive Status: Within Functional Limits for tasks assessed                                 General Comments: pt emotionally labile during session, however motivated to participate in therapy                Home Living Family/patient expects to be discharged to:: Private residence Living Arrangements: Children Available Help at Discharge: Family Type of Home: House Home Access: Stairs to enter [employer] of Steps: 12, or 2 Entrance Stairs-Rails: Right;Left;Can reach both Home Layout: Two level;Able to live on main level with bedroom/bathroom     Bathroom Shower/Tub: Occupational psychologist: Standard     Home Equipment: Shower seat;Cane - single point   Additional Comments: Has a cane but does not use      Prior Functioning/Environment Prior Level of Function : Needs assist       Physical Assist : ADLs (physical)   ADLs (physical): Dressing;Bathing Mobility Comments: Pt independent with mobility (has SPC but does not use). Family endorses 2 falls within the past 6 months ADLs Comments: Pt requires assistance for LB ADLs (dressing and occassionally bathing)        OT Problem List: Decreased strength;Decreased activity tolerance;Impaired balance (sitting and/or standing);Decreased safety awareness;Decreased knowledge of precautions      OT Treatment/Interventions: Self-care/ADL training;Therapeutic exercise;Energy conservation;DME and/or AE instruction;Therapeutic activities;Patient/family education;Balance training    OT Goals(Current goals can be found in the care plan section) Acute Rehab OT Goals Patient Stated Goal: to go home OT Goal Formulation: With patient Time For Goal Achievement: 03/18/21 Potential to  Achieve Goals: Good ADL Goals Pt Will Perform Upper Body Bathing: with set-up;standing Pt Will Perform Lower Body Bathing: with set-up;sit to/from stand Pt Will Transfer to Toilet: with modified independence;ambulating;regular height toilet  OT Frequency: Min 2X/week       AM-PAC OT "6 Clicks" Daily Activity     Outcome Measure Help from another person eating meals?: None Help from another person taking care of personal grooming?: A Little Help from another person toileting, which includes using toliet, bedpan, or urinal?: A Little Help from another person bathing (including washing, rinsing, drying)?: A Lot Help from another person to put on and taking off regular upper body clothing?: A Little Help from another person to put on and taking off regular lower body clothing?: A Lot 6 Click Score: 17   End of Session Equipment Utilized During Treatment: Rolling walker (2 wheels) Nurse Communication: Mobility status  Activity Tolerance: Patient tolerated treatment well Patient left: in chair;Other (comment) (with PT)  OT Visit Diagnosis: Unsteadiness on feet (R26.81);Muscle weakness (generalized) (M62.81);History of falling (Z91.81)                Time: 8016-5537 OT Time Calculation (min): 17 min Charges:  OT General Charges $OT Visit: 1 Visit OT Evaluation $OT Eval Moderate Complexity: [place], OTR/L [place]

## 2021-03-04 NOTE — TOC Progression Note (Addendum)
Transition of Care [facility]) - Progression Note    Patient Details  Name: Elizabeth [name] MRN: [id number] Date of Birth: 1950/08/29  Transition of Care [facility] CM/SW [place], [place] Phone Number: 03/04/2021, 11:35 AM  Clinical Narrative:     CSW spoke to patient's son [name] [phone]) to follow up and let him know the patient was accepted to [facility] and should hear from them soon. CSW provided son with [facility] [phone] if he has any further questions. [name] requested CSW provide mental health resources to the patient.  CSW spoke to patient and offered patient mental health resources and patient accepted. No further TOC needs.       Expected Discharge Plan and Services                                                 Social Determinants of Health (SDOH) Interventions    Readmission Risk Interventions No flowsheet data found.

## 2021-03-04 NOTE — Progress Notes (Signed)
Dr. Lysle Pearl at bedside and changed patient's surgical dressing. MD gave order to discontinue lactated ringers.

## 2021-03-04 NOTE — Discharge Instructions (Addendum)
BREAST SURGERY, Care After This sheet gives you information about how to care for yourself after your procedure. Your doctor may also give you more specific instructions. If you have problems or questions, contact your doctor. Follow these instructions at home: Care for cuts from surgery (incisions)    Follow instructions from your doctor about how to take care of your cuts from surgery. Make sure you: Wash your hands with soap and water before you change your bandage (dressing). If you cannot use soap and water, use hand sanitizer. Change your bandage as told by your doctor. Leave stitches (sutures), skin glue, or skin tape (adhesive) strips in place. They may need to stay in place for 2 weeks or longer. If tape strips get loose and curl up, you may trim the loose edges. Do not remove tape strips completely unless your doctor says it is okay. Do not take baths, swim, or use a hot tub until your doctor says it is okay. OK TO SHOWER ONCE D/C'D RECHARGE JP BULB AS INSTRUCTED PRIOR TO D/C. RECORD OUTPUT AMOUNT AND COLOR EVERY DAY KEEP MASTECTOMY BRA IN PLACE.  CHANGE DRAIN DRESSING EVERY DAY BY WRAPPING GAUZE AROUND DRAIN SITE.  KEEP STAPLE LINE COVERED WITH GAUZE AND APPLY MASTECTOMY BRA OVER IT.  CHANGE GAUZE EVERY DAY TO PREVENT INFECTION  Check your surgical cut area every day for signs of infection. Check for: More redness, swelling, or pain. More fluid or blood. Warmth. Pus or a bad smell. Activity Do not drive or use heavy machinery while taking prescription pain medicine. Do not play contact sports until your doctor says it is okay. Do not drive for 24 hours if you were given a medicine to help you relax (sedative). Rest as needed. Do not return to work or school until your doctor says it is okay. Wear supportive bras as tolerated to minimize discomfort General instructions  tylenol and advil as needed for discomfort.  Please alternate between the two every four hours as needed for  pain.    Use narcotics, if prescribed, only when tylenol and motrin is not enough to control pain.  325-650mg  every 8hrs to max of 4000mg /24hrs (including the 325mg  in every norco dose) for the tylenol.    Advil up to 800mg  per dose every 8hrs as needed for pain.   To prevent or treat constipation while you are taking prescription pain medicine, your doctor may recommend that you: Drink enough fluid to keep your pee (urine) clear or pale yellow. Take over-the-counter or prescription medicines. Eat foods that are high in fiber, such as fresh fruits and vegetables, whole grains, and beans. Limit foods that are high in fat and processed sugars, such as fried and sweet foods. Contact a doctor if: You develop a rash. You have more redness, swelling, or pain around your surgical cuts. You have more fluid or blood coming from your surgical cuts. Your surgical cuts feel warm to the touch. You have pus or a bad smell coming from your surgical cuts. You have a fever. One or more of your surgical cuts breaks open. You have trouble breathing. You have chest pain. You have pain that is getting worse in your shoulders. You faint or feel dizzy when you stand. You have very bad pain in your belly (abdomen). You are sick to your stomach (nauseous) for more than one day. You have throwing up (vomiting) that lasts for more than one day. You have leg pain. This information is not intended to replace advice  given to you by your health care provider. Make sure you discuss any questions you have with your health care provider.   [facility]  Psychiatrist in [place], [place] Address: [address], [place], [place] [address] Hours:  Closed ? Opens 8:30?AM [employer] and Services: [email or web address] Phone: [phone]

## 2021-03-04 NOTE — Progress Notes (Signed)
Subjective:  CC: Elizabeth [name] is a 71 y.o. female  Hospital stay day 2, 2 Days Post-Op modified radical mastectomy  HPI: No acute issues overnight.  Weaned off pressors and ready to transfer to floor.  ROS:  General: Denies weight loss, weight gain, fatigue, fevers, chills, and night sweats. Heart: Denies chest pain, palpitations, racing heart, irregular heartbeat, leg pain or swelling, and decreased activity tolerance. Respiratory: Denies breathing difficulty, shortness of breath, wheezing, cough, and sputum. GI: Denies change in appetite, heartburn, nausea, vomiting, constipation, diarrhea, and blood in stool. GU: Denies difficulty urinating, pain with urinating, urgency, frequency, blood in urine.   Objective:   Temp:  [97.6 F (36.4 C)-99.6 F (37.6 C)] 97.6 F (36.4 C) (02/12 0506) Pulse Rate:  [69-113] 76 (02/12 0900) Resp:  [14-32] 25 (02/12 0900) BP: (78-117)/(50-78) 112/62 (02/12 0900) SpO2:  [85 %-100 %] 97 % (02/12 0900)     Height: 5\' 1"  (154.9 cm) Weight: 53.1 kg BMI (Calculated): 22.13   Intake/Output this shift:   Intake/Output Summary (Last 24 hours) at 03/04/2021 0943 Last data filed at 03/04/2021 0700 Gross per 24 hour  Intake 3599.44 ml  Output 65 ml  Net 3534.44 ml    Constitutional :  alert, cooperative, appears stated age, and no distress  Respiratory:  clear to auscultation bilaterally  Cardiovascular:  regular rate and rhythm     Skin: Cool and moist.  Staple line C/D/I. Some swelling in axilla and dependent edema noted in RUE.  As expected at this point. JP remains serosanguinous. No TTP  Psychiatric: Normal affect, non-agitated, not confused       LABS:  CMP Latest Ref Rng & Units 03/04/2021 03/03/2021 03/02/2021  Glucose 70 - 99 mg/dL 97 160(H) -  BUN 8 - 23 mg/dL 18 19 -  Creatinine 0.44 - 1.00 mg/dL 0.84 0.93 0.80  Sodium 135 - 145 mmol/L 141 140 -  Potassium 3.5 - 5.1 mmol/L 3.7 4.0 -  Chloride 98 - 111 mmol/L 113(H) 109 -  CO2 22 -  32 mmol/L 24 23 -  Calcium 8.9 - 10.3 mg/dL 7.7(L) 8.0(L) -  Total Protein 6.5 - 8.1 g/dL - - -  Total Bilirubin 0.3 - 1.2 mg/dL - - -  Alkaline Phos 38 - 126 U/L - - -  AST 15 - 41 U/L - - -  ALT 14 - 54 U/L - - -   CBC Latest Ref Rng & Units 03/04/2021 03/03/2021 03/03/2021  WBC 4.0 - 10.5 K/uL 6.8 - 9.2  Hemoglobin 12.0 - 15.0 g/dL 8.1(L) 7.3(L) 7.7(L)  Hematocrit 36.0 - 46.0 % 25.1(L) 23.1(L) 24.5(L)  Platelets 150 - 400 K/uL 262 - 358    RADS: N/a Assessment:   Status post modified radical mastectomy for biopsy-proven breast cancer with lymph node metastasis.  Doing well.  Transfer to floor and observe for one more day.  Tolerating diet and doing well from pain and swelling standpoint.  Pending PT/OT recs as well as psych eval per family request.  labs/images/medications/previous chart entries reviewed personally and relevant changes/updates noted above.

## 2021-03-05 LAB — CBC
HCT: 30.1 % — ABNORMAL LOW (ref 36.0–46.0)
Hemoglobin: 9.7 g/dL — ABNORMAL LOW (ref 12.0–15.0)
MCH: 29 pg (ref 26.0–34.0)
MCHC: 32.2 g/dL (ref 30.0–36.0)
MCV: 90.1 fL (ref 80.0–100.0)
Platelets: 357 10*3/uL (ref 150–400)
RBC: 3.34 MIL/uL — ABNORMAL LOW (ref 3.87–5.11)
RDW: 14.8 % (ref 11.5–15.5)
WBC: 7.2 10*3/uL (ref 4.0–10.5)
nRBC: 0 % (ref 0.0–0.2)

## 2021-03-05 LAB — BASIC METABOLIC PANEL
Anion gap: 6 (ref 5–15)
BUN: 20 mg/dL (ref 8–23)
CO2: 24 mmol/L (ref 22–32)
Calcium: 8.1 mg/dL — ABNORMAL LOW (ref 8.9–10.3)
Chloride: 108 mmol/L (ref 98–111)
Creatinine, Ser: 0.78 mg/dL (ref 0.44–1.00)
GFR, Estimated: >60 mL/min (ref >60)
Glucose, Bld: 99 mg/dL (ref 70–99)
Potassium: 3.7 mmol/L (ref 3.5–5.1)
Sodium: 138 mmol/L (ref 135–145)

## 2021-03-05 LAB — TYPE AND SCREEN
ABO/RH(D): O POS
Antibody Screen: NEGATIVE
Unit division: 0

## 2021-03-05 LAB — PHOSPHORUS: Phosphorus: 2.9 mg/dL (ref 2.5–4.6)

## 2021-03-05 LAB — BPAM RBC
Blood Product Expiration Date: 202303142359
ISSUE DATE / TIME: 202302120205
Unit Type and Rh: 5100

## 2021-03-05 LAB — MAGNESIUM: Magnesium: 1.9 mg/dL (ref 1.7–2.4)

## 2021-03-05 MED ORDER — DOCUSATE SODIUM 100 MG PO CAPS: 100.0000 mg | ORAL_CAPSULE | Freq: Two times a day (BID) | ORAL | 0 refills | Status: AC | PRN

## 2021-03-05 MED ORDER — IBUPROFEN 400 MG PO TABS: 400.0000 mg | ORAL_TABLET | Freq: Three times a day (TID) | ORAL | 0 refills | Status: DC | PRN

## 2021-03-05 MED ORDER — ACETAMINOPHEN 325 MG PO TABS: 650.0000 mg | ORAL_TABLET | Freq: Three times a day (TID) | ORAL | 0 refills | Status: AC | PRN

## 2021-03-05 MED ORDER — HYDROCODONE-ACETAMINOPHEN 5-325 MG PO TABS: 1.0000 | ORAL_TABLET | Freq: Four times a day (QID) | ORAL | 0 refills | Status: DC | PRN

## 2021-03-05 NOTE — Care Management Important Message (Signed)
Important Message  Patient Details  Name: Elizabeth [name] MRN: [id number] Date of Birth: [birth year]   Medicare Important Message Given:  Yes     Dannette Barbara 03/05/2021, 11:01 AM

## 2021-03-05 NOTE — TOC Transition Note (Signed)
Transition of Care [facility]) - CM/SW Discharge Note   Patient Details  Name: CHRISTENE POUNDS MRN: [id number] Date of Birth: 1950-03-22  Transition of Care [facility]) CM/SW Contact:  Candie Chroman, LCSW Phone Number: 03/05/2021, 8:58 AM   Clinical Narrative:   Patient has orders to discharge home today. Left message for [facility] representative to notify. Rollator ordered through [employer] and has been delivered to the room. No further concerns. CSW signing off.  Final next level of care: [place] Barriers to Discharge: Barriers Resolved   Patient Goals and CMS Choice        Discharge Placement                Patient to be transferred to facility by: Family   Patient and family notified of of transfer: 03/05/21  Discharge Plan and Services                DME Arranged: Gilford Rile rolling with seat DME Agency: [employer] Date DME Agency Contacted: 03/05/21   Representative spoke with at DME Agency: Stephannie Peters HH Arranged: PT, Nurse's Aide [facility] Agency: [place] Date [facility] Agency Contacted: 03/05/21   Representative spoke with at [place]: Left message for [employer]  Social Determinants of Health (SDOH) Interventions     Readmission Risk Interventions No flowsheet data found.

## 2021-03-05 NOTE — Progress Notes (Signed)
[name] to be D/C'd Home per MD order.  Discussed prescriptions and follow up appointments with the patient and daughter. Prescriptions given to patient, medication list explained in detail. Pt verbalized understanding.  Allergies as of 03/05/2021       Reactions   Penicillins Hives   TOLERATED CEFAZOLIN        Medication List     TAKE these medications    acetaminophen 325 MG tablet Commonly known as: Tylenol Take 2 tablets (650 mg total) by mouth every 8 (eight) hours as needed for mild pain.   alendronate 70 MG tablet Commonly known as: FOSAMAX Take 1 tablet by mouth every Monday.   atorvastatin 20 MG tablet Commonly known as: LIPITOR Take 20 mg by mouth daily.   clonazePAM 1 MG tablet Commonly known as: KLONOPIN Take 1 mg by mouth 2 (two) times daily as needed for anxiety.   docusate sodium 100 MG capsule Commonly known as: Colace Take 1 capsule (100 mg total) by mouth 2 (two) times daily as needed for up to 10 days for mild constipation.   HYDROcodone-acetaminophen 5-325 MG tablet Commonly known as: Norco Take 1 tablet by mouth every 6 (six) hours as needed for up to 6 doses for moderate pain.   ibuprofen 400 MG tablet Commonly known as: ADVIL Take 1 tablet (400 mg total) by mouth every 8 (eight) hours as needed for mild pain or moderate pain.   levothyroxine 88 MCG tablet Commonly known as: SYNTHROID Take 88 mcg by mouth daily before breakfast.               Durable Medical Equipment  (From admission, onward)           Start     Ordered   03/05/21 0821  For home use only DME 4 wheeled rolling walker with seat  Once       Question:  Patient needs a walker to treat with the following condition  Answer:  Physical deconditioning   03/05/21 0821            Vitals:   03/05/21 0347 03/05/21 0809  BP: 130/82 122/73  Pulse: 96 80  Resp: 18 18  Temp: 98.2 F (36.8 C) (!) 97.3 F (36.3 C)  SpO2: 96% 98%    Skin clean, dry and  intact without evidence of skin break down, no evidence of skin tears noted. IV catheter discontinued intact. Site without signs and symptoms of complications. Dressing and pressure applied. Pt denies pain at this time. No complaints noted.  An After Visit Summary was printed and given to the patient. Patient escorted via [place], and D/C home via private auto.  Rolley Sims

## 2021-03-05 NOTE — Telephone Encounter (Signed)
I am not successful in reaching patient for her OT screen. Pt has an apt on 2/22 with Dr. Grayland Ormond to review her path results. I went ahead and scheduled her apts with Gwenette Greet on 3/1, but the referral still needs to be discussed by the nursing Team to the patient. Thanks. Nira Conn

## 2021-03-06 ENCOUNTER — Encounter: Payer: Self-pay | Admitting: *Deleted

## 2021-03-06 NOTE — Discharge Summary (Signed)
Physician Discharge Summary  Patient ID: Elizabeth [name] MRN: [id number] DOB/AGE: 03-30-1950 71 y.o.  Admit date: 03/02/2021 Discharge date: 03/05/21  Admission Diagnoses: breast CA  Discharge Diagnoses:  Same as above  Discharged Condition: good  Hospital Course: admitted for above. Underwent surgery.  Please see op note for details.  Post op, developed asymptomatic hypotension requiring pressor support, thought to be related to volume depletion and anesthesia.  Monitored in step down with ICU consult.  Weaned off pressors within 24hrs, and then trasnferred to floor.  At time of d/c, tolerating diet and pain controlled. Some arm swelling noted, and JP with serosanguinous drainage at time of discharge. Psych also consulted per family request.  Recommended f/u with oupt with no change in meds at this time.  HH and DME arranged as well to facilitatel continued recovery.  Consults: pulmonary/intensive care  Discharge Exam: Blood pressure 122/73, pulse 80, temperature (!) 97.3 F (36.3 C), temperature source Oral, resp. rate 18, height 5\' 1"  (1.549 m), weight 53.1 kg, SpO2 98 %. General appearance: alert and cooperative Mastectomy incision staples c/d/I.  JP with serosanguinous drainage  Disposition:  Discharge disposition: 06-Home-Health Care Svc       Discharge Instructions     Discharge patient   Complete by: As directed    Discharge disposition: 01-Home or Self Care   Discharge patient date: 03/05/2021      Allergies as of 03/05/2021       Reactions   Penicillins Hives   TOLERATED CEFAZOLIN        Medication List     TAKE these medications    acetaminophen 325 MG tablet Commonly known as: Tylenol Take 2 tablets (650 mg total) by mouth every 8 (eight) hours as needed for mild pain.   alendronate 70 MG tablet Commonly known as: FOSAMAX Take 1 tablet by mouth every Monday.   atorvastatin 20 MG tablet Commonly known as: LIPITOR Take 20 mg by mouth  daily.   clonazePAM 1 MG tablet Commonly known as: KLONOPIN Take 1 mg by mouth 2 (two) times daily as needed for anxiety.   docusate sodium 100 MG capsule Commonly known as: Colace Take 1 capsule (100 mg total) by mouth 2 (two) times daily as needed for up to 10 days for mild constipation.   HYDROcodone-acetaminophen 5-325 MG tablet Commonly known as: Norco Take 1 tablet by mouth every 6 (six) hours as needed for up to 6 doses for moderate pain.   ibuprofen 400 MG tablet Commonly known as: ADVIL Take 1 tablet (400 mg total) by mouth every 8 (eight) hours as needed for mild pain or moderate pain.   levothyroxine 88 MCG tablet Commonly known as: SYNTHROID Take 88 mcg by mouth daily before breakfast.        Follow-up Information     Aluna Whiston, DO. Go on 03/14/2021.   Specialty: Surgery Why: f/u mastectomy. appt @0900  Contact information: [place] [address] [phone]         [facility] Follow up.   Specialty: Home Health Services Why: They will follow up with you for your home health needs. Contact information: [place] [address] [phone]                  Total time spent arranging discharge was >33min. Signed: Benjamine Sprague 03/06/2021, 10:06 AM

## 2021-03-06 NOTE — Progress Notes (Signed)
Called patient to follow up post surgery.  Spoke to her and her daughter Regino Schultze.  States she is still in pain and is having swelling in her arms and legs.  Regino Schultze states Dr. Lysle Pearl is aware of the swelling and a physical therapist is coming out today.  Encouraged them to call Dr. Lysle Pearl about her unrelieved pain.  They are agreeable.

## 2021-03-07 ENCOUNTER — Other Ambulatory Visit: Payer: Self-pay | Admitting: Anatomic Pathology & Clinical Pathology

## 2021-03-07 LAB — SURGICAL PATHOLOGY

## 2021-03-12 NOTE — Progress Notes (Signed)
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  INTERVAL HISTORY: Patient returns to clinic today for postsurgical follow-up, discussion of her final pathology results, and treatment planning.  She continues to have pain from her surgery, but otherwise feels well.  She is highly anxious and tearful at times.  She has no neurologic complaints.  She denies any recent fevers or illnesses.  She has a good appetite and denies weight loss.  She has no chest pain, shortness of breath, cough, or hemoptysis.  She denies any nausea, vomiting, constipation, or diarrhea.  She has no urinary complaints.  Patient offers no further specific complaints today.  REVIEW OF SYSTEMS:   Review of Systems  Constitutional: Negative.  Negative for fever, malaise/fatigue and weight loss.  Respiratory: Negative.  Negative for cough, hemoptysis and shortness of breath.   Cardiovascular: Negative.  Negative for chest pain and leg swelling.  Gastrointestinal: Negative.  Negative for abdominal pain.  Genitourinary: Negative.  Negative for dysuria.  Musculoskeletal: Negative.  Negative for back pain.  Skin: Negative.  Negative for rash.  Neurological: Negative.  Negative for dizziness, focal weakness, weakness and headaches.  Psychiatric/Behavioral:  The patient is nervous/anxious.    As per HPI. Otherwise, a complete review of systems is negative.  PAST MEDICAL HISTORY: Past Medical History:  Diagnosis Date   Acute cholecystitis 2016   Anemia    Anemia 2016   Anxiety    panic attacks   Anxiety and  depression    Bipolar 1 disorder (HCC)    Bowel obstruction ([place]) 2017   Common bile duct dilatation 2015   Dyspnea    Edema    Fibromyalgia    GI bleed 2017   History of kidney stones 01/21/2009   HSV-1 infection    Hyperglycemia    Hyperlipidemia    Hypokalemia    Hypothyroidism    Liver cyst    Malignant neoplasm of right female breast, unspecified estrogen receptor status, unspecified site of breast ([place])    Memory loss    Osteopenia    Panic disorder    Schizophrenia ([place])    Ventral hernia     PAST SURGICAL HISTORY: Past Surgical History:  Procedure Laterality Date   ABDOMINAL HYSTERECTOMY     BREAST BIOPSY Right 01/19/2021   [place] Bx, ribbon, path pending   BREAST BIOPSY Right 01/19/2021   [place] Bx, Venus, path pending   BREAST BIOPSY Right 01/19/2021   [place] Bx, heart, path pending   BREAST BIOPSY Right 01/19/2021   [place] BX Axilla, Coil, path pending   CESAREAN SECTION     CHOLECYSTECTOMY OPEN  11/17/2013   with repair of bile duct-Dr. Marina Gravel   MASTECTOMY MODIFIED RADICAL Right 03/02/2021   Procedure: MASTECTOMY MODIFIED RADICAL;  Surgeon: Benjamine Sprague, DO;  Location: [facility] ORS;  Service: General;  Laterality: Right;   OPEN REDUCTION INTERNAL FIXATION (ORIF) DISTAL RADIAL FRACTURE Right 12/20/2014   Procedure: OPEN REDUCTION INTERNAL FIXATION (ORIF) RIGHT DISTAL RADIAL FRACTURE;  Surgeon: Renette Butters, MD;  Location: [facility];  Service: Orthopedics;  Laterality: Right;   STOMACH SURGERY      FAMILY HISTORY:  Family History  Problem Relation Age of Onset   Heart disease Father    Breast cancer Paternal Grandmother    Asthma Daughter    Breast cancer Paternal Aunt    Kidney disease Paternal Aunt    Bladder Cancer Neg Hx     ADVANCED DIRECTIVES (Y/N):  N  HEALTH MAINTENANCE: Social History   Tobacco Use   Smoking status: Former    Packs/day: 0.00    Years: 0.00    Pack years: 0.00    Types: Cigarettes    Quit date: 11/03/1973    Years  since quitting: 47.3    Passive exposure: Past   Smokeless tobacco: Never   Tobacco comments:    Smoked briefly as a teen- lives in secondhand smoke.   Vaping Use   Vaping Use: Never used  Substance Use Topics   Alcohol use: No    Alcohol/week: 0.0 standard drinks   Drug use: No     Colonoscopy:  PAP:  Bone density:  Lipid panel:  Allergies  Allergen Reactions   Penicillins Hives    TOLERATED CEFAZOLIN    Current Outpatient Medications  Medication Sig Dispense Refill   acetaminophen (TYLENOL) 325 MG tablet Take 2 tablets (650 mg total) by mouth every 8 (eight) hours as needed for mild pain. 40 tablet 0   alendronate (FOSAMAX) 70 MG tablet Take 1 tablet by mouth every Monday.     atorvastatin (LIPITOR) 20 MG tablet Take 20 mg by mouth daily.     celecoxib (CELEBREX) 200 MG capsule Take 200 mg by mouth 2 (two) times daily.     clonazePAM (KLONOPIN) 1 MG tablet Take 1 mg by mouth 2 (two) times daily as needed for anxiety.     docusate sodium (COLACE) 100 MG capsule Take 1 capsule (100 mg total) by mouth 2 (two) times daily as needed for up to 10 days for mild constipation. 20 capsule 0   HYDROcodone-acetaminophen (NORCO) 5-325 MG tablet Take 1 tablet by mouth every 6 (six) hours as needed for up to 6 doses for moderate pain. 6 tablet 0   levothyroxine (SYNTHROID, LEVOTHROID) 88 MCG tablet Take 88 mcg by mouth daily before breakfast.     ondansetron (ZOFRAN-ODT) 4 MG disintegrating tablet Take 4 mg by mouth every 8 (eight) hours as needed.     ibuprofen (ADVIL) 400 MG tablet Take 1 tablet (400 mg total) by mouth every 8 (eight) hours as needed for mild pain or moderate pain. (Patient not taking: Reported on 03/14/2021) 30 tablet 0   No current facility-administered medications for this visit.    OBJECTIVE: Vitals:   03/14/21 1117  BP: 103/66  Pulse: 78  Resp: 17  Temp: (!) 96 F (35.6 C)  SpO2: 100%     Body mass index is 23.05 kg/m.    ECOG FS:0 -  Asymptomatic  General: Well-developed, well-nourished, no acute distress. Eyes: Pink conjunctiva, anicteric sclera. HEENT: Normocephalic, moist mucous membranes. Breast: Right mastectomy. Lungs: No audible wheezing or coughing. Heart: Regular rate and rhythm. Abdomen: Soft, nontender, no obvious distention. Musculoskeletal: No edema, cyanosis, or clubbing. Neuro: Alert, answering all questions appropriately. Cranial nerves grossly intact. Skin: No rashes or petechiae noted. Psych: Normal affect.    LAB RESULTS:  Lab Results  Component Value Date   NA 138 03/05/2021   K 3.7 03/05/2021   CL 108 03/05/2021   CO2 24 03/05/2021   GLUCOSE 99 03/05/2021   BUN 20 03/05/2021   CREATININE 0.78 03/05/2021  CALCIUM 8.1 (L) 03/05/2021   PROT 7.9 12/18/2015   ALBUMIN 2.5 (L) 03/03/2021   AST 35 12/18/2015   ALT 68 (H) 12/18/2015   ALKPHOS 188 (H) 12/18/2015   BILITOT 1.4 (H) 12/18/2015   GFRNONAA >60 03/05/2021   GFRAA 28 (L) 12/18/2015    Lab Results  Component Value Date   WBC 7.2 03/05/2021   NEUTROABS 8.1 (H) 11/20/2013   HGB 9.7 (L) 03/05/2021   HCT 30.1 (L) 03/05/2021   MCV 90.1 03/05/2021   PLT 357 03/05/2021     STUDIES: CT Head Wo Contrast  Result Date: 02/15/2021 CLINICAL DATA:  Trauma EXAM: CT HEAD WITHOUT CONTRAST TECHNIQUE: Contiguous axial images were obtained from the base of the skull through the vertex without intravenous contrast. RADIATION DOSE REDUCTION: This exam was performed according to the departmental dose-optimization program which includes automated exposure control, adjustment of the mA and/or kV according to patient size and/or use of iterative reconstruction technique. COMPARISON:  None. FINDINGS: Brain: No acute intracranial findings are seen. There are no signs of bleeding within the cranium. Ventricles are not dilated. Cortical sulci are prominent. Vascular: Unremarkable. Skull: No fracture is seen. There is subcutaneous contusion/hematoma in  the left frontal scalp. Sinuses/Orbits: There is mucosal thickening in frontal, ethmoid, sphenoid and left maxillary sinuses. Other: None IMPRESSION: No acute intracranial findings are seen.  Atrophy. There is subcutaneous hematoma in the left frontal scalp. No fracture is seen in the calvarium. Chronic sinusitis. Electronically Signed   By: Elmer Picker M.D.   On: 02/15/2021 19:21   CT Cervical Spine Wo Contrast  Result Date: 02/15/2021 CLINICAL DATA:  Fall.  Neck trauma (Age >= 65y) EXAM: CT CERVICAL SPINE WITHOUT CONTRAST TECHNIQUE: Multidetector CT imaging of the cervical spine was performed without intravenous contrast. Multiplanar CT image reconstructions were also generated. RADIATION DOSE REDUCTION: This exam was performed according to the departmental dose-optimization program which includes automated exposure control, adjustment of the mA and/or kV according to patient size and/or use of iterative reconstruction technique. COMPARISON:  None. FINDINGS: Alignment: Normal Skull base and vertebrae: No acute fracture. No primary bone lesion or focal pathologic process. Soft tissues and spinal canal: No prevertebral fluid or swelling. No visible canal hematoma. Disc levels: Diffuse degenerative disc disease with disc space narrowing and early spurring. Bilateral degenerative facet disease. Upper chest: No acute findings Other: None IMPRESSION: No acute bony abnormality. Electronically Signed   By: Rolm Baptise M.D.   On: 02/15/2021 20:14   MM BREAST SURGICAL SPECIMEN  Result Date: 03/02/2021 CLINICAL DATA:  71 year old with biopsy-proven multifocal invasive ductal carcinoma involving the LOWER INNER QUADRANT of the RIGHT breast and a biopsy-proven metastatic RIGHT axillary lymph node. EXAM: SPECIMEN RADIOGRAPH OF THE RIGHT AXILLA COMPARISON:  Previous exam(s). FINDINGS: Status post excision of the RIGHT axilla. Multiple lymph nodes are present in the specimen, including the biopsied lymph node  with the spiral shaped HydroMark tissue marking clip. The lymph node with the clip was marked for pathology at I-8. This was discussed with the operating room nurse by telephone at the time of interpretation on 03/02/2021 at 1:48 p.m. IMPRESSION: Specimen radiograph of the RIGHT axilla. Electronically Signed   By: Evangeline Dakin M.D.   On: 03/02/2021 15:29  DG Chest Port 1 View  Result Date: 03/03/2021 CLINICAL DATA:  Follow-up right mastectomy EXAM: PORTABLE CHEST 1 VIEW COMPARISON:  09/03/2020 FINDINGS: Artifact overlies the chest. Right mastectomy with surgical drain overlying the region. There is mild atelectasis in both lower  lobes. No lobar collapse. No pneumothorax. IMPRESSION: Mild atelectasis in both lower lobes, following right mastectomy. Electronically Signed   By: Nelson Chimes M.D.   On: 03/03/2021 19:35   CT Maxillofacial Wo Contrast  Result Date: 02/15/2021 CLINICAL DATA:  Fall.  Facial trauma, blunt EXAM: CT MAXILLOFACIAL WITHOUT CONTRAST TECHNIQUE: Multidetector CT imaging of the maxillofacial structures was performed. Multiplanar CT image reconstructions were also generated. RADIATION DOSE REDUCTION: This exam was performed according to the departmental dose-optimization program which includes automated exposure control, adjustment of the mA and/or kV according to patient size and/or use of iterative reconstruction technique. COMPARISON:  None. FINDINGS: Osseous: No fracture or mandibular dislocation. No destructive process. Orbits: Negative. No traumatic or inflammatory finding. Sinuses: Mucosal thickening in the left frontal sinus, left ethmoid air cells and left maxillary sinus. Air-fluid levels in the sphenoid sinuses bilaterally and left maxillary sinus. Findings likely acute on chronic sinusitis. Mastoid air cells clear. Soft tissues: Soft tissue swelling over the forehead. Limited intracranial: See head CT report IMPRESSION: No facial or orbital fracture. Acute on chronic  sinusitis. Electronically Signed   By: Rolm Baptise M.D.   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  She underwent full mastectomy on December 30, 2021.  Despite recommendation of adjuvant Adriamycin and Cytoxan followed by Taxol, patient continues to be adamant that she will not undergo chemotherapy.  She has highly hesitant to pursue adjuvant XRT as well.  Given her ER/PR status of her tumor, it was recommended patient also take letrozole which she is considering.  No intervention is needed at this time.  Patient will follow-up in 3 weeks for further evaluation and additional treatment planning.  Postoperative pain: Patient was given a prescription for hydrocodone today. Lymphedema: Patient was given a referral to lymphedema clinic as well has an appointment with occupational therapy.   Patient expressed understanding and was in agreement with this plan. She also understands that She can call clinic at any time with any questions, concerns, or complaints.    Cancer Staging  Invasive ductal carcinoma of right breast [facility]) Staging form: Breast, AJCC 8th Edition - Pathologic stage from 03/14/2021: Stage IIIB (pT2, pN3a, cM0, G3, ER+, PR+, HER2-) - Signed by [name], MD on 03/14/2021 Stage prefix: Initial diagnosis Histologic grading system: 3 grade system   [name], MD   03/15/2021 11:31 AM

## 2021-03-13 NOTE — Telephone Encounter (Signed)
Reminder- please discuss referral for OT screen with patient tomorrow at her scheduled apt. I have not been successful in reaching the patient. Thank you, Nira Conn, RN

## 2021-03-14 ENCOUNTER — Encounter: Payer: Self-pay | Admitting: Oncology

## 2021-03-14 ENCOUNTER — Other Ambulatory Visit: Payer: Self-pay

## 2021-03-14 ENCOUNTER — Inpatient Hospital Stay (HOSPITAL_BASED_OUTPATIENT_CLINIC_OR_DEPARTMENT_OTHER): Payer: Medicare HMO | Admitting: Oncology

## 2021-03-14 VITALS — BP 103/66 | HR 78 | Temp 96.0°F | Resp 17 | Wt 122.0 lb

## 2021-03-14 DIAGNOSIS — I890 Lymphedema, not elsewhere classified: Secondary | ICD-10-CM | POA: Diagnosis not present

## 2021-03-14 DIAGNOSIS — Z90710 Acquired absence of both cervix and uterus: Secondary | ICD-10-CM | POA: Diagnosis not present

## 2021-03-14 DIAGNOSIS — C50911 Malignant neoplasm of unspecified site of right female breast: Secondary | ICD-10-CM

## 2021-03-14 DIAGNOSIS — Z170 Estrogen receptor positive status [ER+]: Secondary | ICD-10-CM | POA: Diagnosis not present

## 2021-03-14 DIAGNOSIS — C773 Secondary and unspecified malignant neoplasm of axilla and upper limb lymph nodes: Secondary | ICD-10-CM | POA: Diagnosis not present

## 2021-03-14 DIAGNOSIS — Z803 Family history of malignant neoplasm of breast: Secondary | ICD-10-CM | POA: Diagnosis not present

## 2021-03-14 DIAGNOSIS — G8918 Other acute postprocedural pain: Secondary | ICD-10-CM | POA: Diagnosis not present

## 2021-03-14 DIAGNOSIS — C50311 Malignant neoplasm of lower-inner quadrant of right female breast: Secondary | ICD-10-CM | POA: Diagnosis not present

## 2021-03-14 NOTE — Progress Notes (Signed)
START ON PATHWAY REGIMEN - Breast     Cycles 1 through 4: A cycle is every 14 days:     Doxorubicin      Cyclophosphamide      Pegfilgrastim-xxxx    Cycles 5 through 16: A cycle is every 7 days:     Paclitaxel   **Always confirm dose/schedule in your pharmacy ordering system**  Patient Characteristics: Postoperative without Neoadjuvant Therapy (Pathologic Staging), Invasive Disease, Adjuvant Therapy, HER2 Negative/Unknown/Equivocal, ER Positive, Node Positive, Node Positive (4+) Therapeutic Status: Postoperative without Neoadjuvant Therapy (Pathologic Staging) AJCC Grade: G3 AJCC N Category: pN3a AJCC M Category: cM0 ER Status: Positive (+) AJCC 8 Stage Grouping: IIIB HER2 Status: Negative (-) Oncotype Dx Recurrence Score: Not Appropriate AJCC T Category: pT2 PR Status: Positive (+) Adjuvant Therapy Status: No Adjuvant Therapy Received Yet or Changing Initial Adjuvant Regimen due to Tolerance Intent of Therapy: Curative Intent, Discussed with Patient

## 2021-03-14 NOTE — Telephone Encounter (Signed)
Pt and daughter made aware of appt with Gwenette Greet

## 2021-03-15 ENCOUNTER — Telehealth: Payer: Self-pay | Admitting: *Deleted

## 2021-03-15 ENCOUNTER — Encounter: Payer: Self-pay | Admitting: Oncology

## 2021-03-15 ENCOUNTER — Other Ambulatory Visit: Payer: Self-pay | Admitting: Emergency Medicine

## 2021-03-15 MED ORDER — HYDROCODONE-ACETAMINOPHEN 5-325 MG PO TABS: 1.0000 | ORAL_TABLET | Freq: Four times a day (QID) | ORAL | 0 refills | Status: DC | PRN

## 2021-03-15 NOTE — Telephone Encounter (Signed)
Med refill routed to [facility] to be filled

## 2021-03-15 NOTE — Telephone Encounter (Signed)
Call from [facility] stating that patient was told at appointment yesterday that pain medicine would be sent to pharmacy for her, but the pharmacy has not received a prescription from [place]. Please advise

## 2021-03-21 ENCOUNTER — Inpatient Hospital Stay: Payer: Medicare HMO

## 2021-03-21 ENCOUNTER — Inpatient Hospital Stay: Payer: Medicare HMO | Attending: Oncology | Admitting: Occupational Therapy

## 2021-03-21 DIAGNOSIS — Z803 Family history of malignant neoplasm of breast: Secondary | ICD-10-CM | POA: Insufficient documentation

## 2021-03-21 DIAGNOSIS — Z87891 Personal history of nicotine dependence: Secondary | ICD-10-CM | POA: Insufficient documentation

## 2021-03-21 DIAGNOSIS — Z90710 Acquired absence of both cervix and uterus: Secondary | ICD-10-CM | POA: Insufficient documentation

## 2021-03-21 DIAGNOSIS — C50911 Malignant neoplasm of unspecified site of right female breast: Secondary | ICD-10-CM | POA: Insufficient documentation

## 2021-03-21 DIAGNOSIS — Z170 Estrogen receptor positive status [ER+]: Secondary | ICD-10-CM | POA: Insufficient documentation

## 2021-03-21 DIAGNOSIS — G8918 Other acute postprocedural pain: Secondary | ICD-10-CM | POA: Insufficient documentation

## 2021-03-21 DIAGNOSIS — G8929 Other chronic pain: Secondary | ICD-10-CM | POA: Insufficient documentation

## 2021-03-21 NOTE — Progress Notes (Signed)
Nutrition ? ?Received message from Cecille Rubin, [place] that patient called to cancel appointment for today.   ? ?RD available if needed.   ? ?Teala Daffron [name], RD, LDN ?Registered Dietitian ?[phone] (mobile) ? ?

## 2021-03-30 ENCOUNTER — Inpatient Hospital Stay: Payer: Medicare HMO | Admitting: Nurse Practitioner

## 2021-03-30 ENCOUNTER — Other Ambulatory Visit: Payer: Self-pay

## 2021-03-30 ENCOUNTER — Telehealth: Payer: Self-pay | Admitting: *Deleted

## 2021-03-30 MED ORDER — HYDROCODONE-ACETAMINOPHEN 5-325 MG PO TABS: 1.0000 | ORAL_TABLET | Freq: Four times a day (QID) | ORAL | 0 refills | Status: DC | PRN

## 2021-03-30 NOTE — Telephone Encounter (Signed)
Pt's daughter called back and stated that pt did not want to come in to clinic today. She stated that pt would prefer to keep her appointment as scheduled on 3/15 with Dr. Grayland Ormond, but did not wish to schedule anything sooner than that. She requests pain medication refill.  ?

## 2021-03-30 NOTE — Telephone Encounter (Signed)
Per Elizabeth [name], she can see pt today. Spoke to pt's daughter Elizabeth [name]. Elizabeth [name] stated that pt was out of pain medication and needed a refill. She also wanted to alert [place] to new symptom of numbness in the right arm. Informed Elizabeth [name] that we would refill pain medication, but that we would also like to see pt in clinic to assess numbing. Daughter in agreement. Appointment scheduled.  ?

## 2021-03-30 NOTE — Telephone Encounter (Signed)
Daughter called reporting that patient is having numbness in her right arm and pain in her right arm pit and chest and is asking what can be done for her. Please advise ?

## 2021-03-30 NOTE — Progress Notes (Unsigned)
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  INTERVAL HISTORY: Patient returns to clinic today for postsurgical follow-up, discussion of her final pathology results, and treatment planning.  She continues to have pain from her surgery, but otherwise feels well.  She is highly anxious and tearful at times.  She has no neurologic complaints.  She denies any recent fevers or illnesses.  She has a good appetite and denies weight loss.  She has no chest pain, shortness of breath, cough, or hemoptysis.  She denies any nausea, vomiting, constipation, or diarrhea.  She has no urinary complaints.  Patient offers no further specific complaints today.  REVIEW OF SYSTEMS:   Review of Systems  Constitutional: Negative.  Negative for fever, malaise/fatigue and weight loss.  Respiratory: Negative.  Negative for cough, hemoptysis and shortness of breath.   Cardiovascular: Negative.  Negative for chest pain and leg swelling.  Gastrointestinal: Negative.  Negative for abdominal pain.  Genitourinary: Negative.  Negative for dysuria.  Musculoskeletal: Negative.  Negative for back pain.  Skin: Negative.  Negative for rash.  Neurological: Negative.  Negative for dizziness, focal weakness, weakness and headaches.  Psychiatric/Behavioral:  The patient is nervous/anxious.    As per HPI. Otherwise, a complete review of systems is negative.  PAST MEDICAL HISTORY: Past Medical History:  Diagnosis Date   Acute cholecystitis 2016   Anemia    Anemia 2016   Anxiety    panic attacks   Anxiety and  depression    Bipolar 1 disorder (HCC)    Bowel obstruction ([place]) 2017   Common bile duct dilatation 2015   Dyspnea    Edema    Fibromyalgia    GI bleed 2017   History of kidney stones 01/21/2009   HSV-1 infection    Hyperglycemia    Hyperlipidemia    Hypokalemia    Hypothyroidism    Liver cyst    Malignant neoplasm of right female breast, unspecified estrogen receptor status, unspecified site of breast ([place])    Memory loss    Osteopenia    Panic disorder    Schizophrenia ([place])    Ventral hernia     PAST SURGICAL HISTORY: Past Surgical History:  Procedure Laterality Date   ABDOMINAL HYSTERECTOMY     BREAST BIOPSY Right 01/19/2021   [place] Bx, ribbon, path pending   BREAST BIOPSY Right 01/19/2021   [place] Bx, Venus, path pending   BREAST BIOPSY Right 01/19/2021   [place] Bx, heart, path pending   BREAST BIOPSY Right 01/19/2021   [place] BX Axilla, Coil, path pending   CESAREAN SECTION     CHOLECYSTECTOMY OPEN  11/17/2013   with repair of bile duct-Dr. Marina Gravel   MASTECTOMY MODIFIED RADICAL Right 03/02/2021   Procedure: MASTECTOMY MODIFIED RADICAL;  Surgeon: Benjamine Sprague, DO;  Location: [facility];  Service: General;  Laterality: Right;   OPEN REDUCTION INTERNAL FIXATION (ORIF) DISTAL RADIAL FRACTURE Right 12/20/2014   Procedure: OPEN REDUCTION INTERNAL FIXATION (ORIF) RIGHT DISTAL RADIAL FRACTURE;  Surgeon: Renette Butters, MD;  Location: [facility];  Service: Orthopedics;  Laterality: Right;   STOMACH SURGERY      FAMILY HISTORY:  Family History  Problem Relation Age of Onset   Heart disease Father    Breast cancer Paternal Grandmother    Asthma Daughter    Breast cancer Paternal Aunt    Kidney disease Paternal Aunt    Bladder Cancer Neg Hx     ADVANCED DIRECTIVES (Y/N):  N  HEALTH MAINTENANCE: Social History   Tobacco Use   Smoking status: Former    Packs/day: 0.00    Years: 0.00    Pack years: 0.00    Types: Cigarettes    Quit date: 11/03/1973    Years  since quitting: 47.4    Passive exposure: Past   Smokeless tobacco: Never   Tobacco comments:    Smoked briefly as a teen- lives in secondhand smoke.   Vaping Use   Vaping Use: Never used  Substance Use Topics   Alcohol use: No    Alcohol/week: 0.0 standard drinks   Drug use: No     Colonoscopy:  PAP:  Bone density:  Lipid panel:  Allergies  Allergen Reactions   Penicillins Hives    TOLERATED CEFAZOLIN    Current Outpatient Medications  Medication Sig Dispense Refill   acetaminophen (TYLENOL) 325 MG tablet Take 2 tablets (650 mg total) by mouth every 8 (eight) hours as needed for mild pain. 40 tablet 0   alendronate (FOSAMAX) 70 MG tablet Take 1 tablet by mouth every Monday.     atorvastatin (LIPITOR) 20 MG tablet Take 20 mg by mouth daily.     celecoxib (CELEBREX) 200 MG capsule Take 200 mg by mouth 2 (two) times daily.     clonazePAM (KLONOPIN) 1 MG tablet Take 1 mg by mouth 2 (two) times daily as needed for anxiety.     HYDROcodone-acetaminophen (NORCO) 5-325 MG tablet Take 1 tablet by mouth every 6 (six) hours as needed for moderate pain. 30 tablet 0   ibuprofen (ADVIL) 400 MG tablet Take 1 tablet (400 mg total) by mouth every 8 (eight) hours as needed for mild pain or moderate pain. (Patient not taking: Reported on 03/14/2021) 30 tablet 0   levothyroxine (SYNTHROID, LEVOTHROID) 88 MCG tablet Take 88 mcg by mouth daily before breakfast.     ondansetron (ZOFRAN-ODT) 4 MG disintegrating tablet Take 4 mg by mouth every 8 (eight) hours as needed.     No current facility-administered medications for this visit.    OBJECTIVE: There were no vitals filed for this visit.    There is no height or weight on file to calculate BMI.    ECOG FS:0 - Asymptomatic  General: Well-developed, well-nourished, no acute distress. Eyes: Pink conjunctiva, anicteric sclera. HEENT: Normocephalic, moist mucous membranes. Breast: Right mastectomy. Lungs: No audible wheezing or coughing. Heart:  Regular rate and rhythm. Abdomen: Soft, nontender, no obvious distention. Musculoskeletal: No edema, cyanosis, or clubbing. Neuro: Alert, answering all questions appropriately. Cranial nerves grossly intact. Skin: No rashes or petechiae noted. Psych: Normal affect.    LAB RESULTS:  Lab Results  Component Value Date   NA 138 03/05/2021   K 3.7 03/05/2021   CL 108 03/05/2021   CO2 24 03/05/2021   GLUCOSE 99 03/05/2021   BUN 20 03/05/2021   CREATININE 0.78 03/05/2021   CALCIUM 8.1 (L) 03/05/2021   PROT 7.9 12/18/2015   ALBUMIN 2.5 (L) 03/03/2021   AST 35 12/18/2015   ALT 68 (H) 12/18/2015   ALKPHOS 188 (H) 12/18/2015   BILITOT 1.4 (H) 12/18/2015   GFRNONAA >60 03/05/2021   GFRAA 28 (L) 12/18/2015  Lab Results  Component Value Date   WBC 7.2 03/05/2021   NEUTROABS 8.1 (H) 11/20/2013   HGB 9.7 (L) 03/05/2021   HCT 30.1 (L) 03/05/2021   MCV 90.1 03/05/2021   PLT 357 03/05/2021     STUDIES: MM BREAST SURGICAL SPECIMEN  Result Date: 03/02/2021 CLINICAL DATA:  71 year old with biopsy-proven multifocal invasive ductal carcinoma involving the LOWER INNER QUADRANT of the RIGHT breast and a biopsy-proven metastatic RIGHT axillary lymph node. EXAM: SPECIMEN RADIOGRAPH OF THE RIGHT AXILLA COMPARISON:  Previous exam(s). FINDINGS: Status post excision of the RIGHT axilla. Multiple lymph nodes are present in the specimen, including the biopsied lymph node with the spiral shaped HydroMark tissue marking clip. The lymph node with the clip was marked for pathology at I-8. This was discussed with the operating room nurse by telephone at the time of interpretation on 03/02/2021 at 1:48 p.m. IMPRESSION: Specimen radiograph of the RIGHT axilla. Electronically Signed   By: Evangeline Dakin M.D.   On: 03/02/2021 15:29  DG Chest Port 1 View  Result Date: 03/03/2021 CLINICAL DATA:  Follow-up right mastectomy EXAM: PORTABLE CHEST 1 VIEW COMPARISON:  09/03/2020 FINDINGS: Artifact overlies the  chest. Right mastectomy with surgical drain overlying the region. There is mild atelectasis in both lower lobes. No lobar collapse. No pneumothorax. IMPRESSION: Mild atelectasis in both lower lobes, following right mastectomy. Electronically Signed   By: Nelson Chimes M.D.   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  She underwent full mastectomy on December 30, 2021.  Despite recommendation of adjuvant Adriamycin and Cytoxan followed by Taxol, patient continues to be adamant that she will not undergo chemotherapy.  She has highly hesitant to pursue adjuvant XRT as well.  Given her ER/PR status of her tumor, it was recommended patient also take letrozole which she is considering.  No intervention is needed at this time.  Patient will follow-up in 3 weeks for further evaluation and additional treatment planning.  Postoperative pain: Patient was given a prescription for hydrocodone today. Lymphedema: Patient was given a referral to lymphedema clinic as well has an appointment with occupational therapy.   Patient expressed understanding and was in agreement with this plan. She also understands that She can call clinic at any time with any questions, concerns, or complaints.    Cancer Staging  Invasive ductal carcinoma of right breast [facility]) Staging form: Breast, AJCC 8th Edition - Pathologic stage from 03/14/2021: Stage IIIB (pT2, pN3a, cM0, G3, ER+, PR+, HER2-) - Signed by [name], MD on 03/14/2021 Stage prefix: Initial diagnosis Histologic grading system: 3 grade system   [name], MD   03/30/2021 9:19 AM

## 2021-04-04 ENCOUNTER — Inpatient Hospital Stay (HOSPITAL_BASED_OUTPATIENT_CLINIC_OR_DEPARTMENT_OTHER): Payer: Medicare HMO | Admitting: Oncology

## 2021-04-04 ENCOUNTER — Other Ambulatory Visit: Payer: Self-pay

## 2021-04-04 ENCOUNTER — Other Ambulatory Visit: Payer: Self-pay | Admitting: *Deleted

## 2021-04-04 VITALS — BP 102/72 | HR 86 | Temp 96.9°F | Resp 16 | Ht 61.0 in | Wt 121.4 lb

## 2021-04-04 DIAGNOSIS — G8918 Other acute postprocedural pain: Secondary | ICD-10-CM | POA: Diagnosis not present

## 2021-04-04 DIAGNOSIS — Z170 Estrogen receptor positive status [ER+]: Secondary | ICD-10-CM | POA: Diagnosis not present

## 2021-04-04 DIAGNOSIS — F3289 Other specified depressive episodes: Secondary | ICD-10-CM

## 2021-04-04 DIAGNOSIS — Z90710 Acquired absence of both cervix and uterus: Secondary | ICD-10-CM | POA: Diagnosis not present

## 2021-04-04 DIAGNOSIS — C50911 Malignant neoplasm of unspecified site of right female breast: Secondary | ICD-10-CM | POA: Diagnosis present

## 2021-04-04 DIAGNOSIS — G8929 Other chronic pain: Secondary | ICD-10-CM | POA: Diagnosis not present

## 2021-04-04 DIAGNOSIS — Z803 Family history of malignant neoplasm of breast: Secondary | ICD-10-CM | POA: Diagnosis not present

## 2021-04-04 DIAGNOSIS — Z87891 Personal history of nicotine dependence: Secondary | ICD-10-CM | POA: Diagnosis not present

## 2021-04-04 MED ORDER — ONDANSETRON 4 MG PO TBDP: 4.0000 mg | ORAL_TABLET | Freq: Three times a day (TID) | ORAL | 3 refills | Status: DC | PRN

## 2021-04-04 MED ORDER — LETROZOLE 2.5 MG PO TABS: 2.5000 mg | ORAL_TABLET | Freq: Every day | ORAL | 3 refills | Status: DC

## 2021-04-04 NOTE — Progress Notes (Signed)
Right sided pain when lying down, shortness of breath with exertion, chronic numbness in legs ?

## 2021-04-06 ENCOUNTER — Encounter: Payer: Self-pay | Admitting: Oncology

## 2021-04-09 ENCOUNTER — Encounter: Payer: Self-pay | Admitting: Licensed Clinical Social Worker

## 2021-04-09 NOTE — Progress Notes (Signed)
[place] CSW Progress Note ? ?Clinical Social Worker contacted patient by phone to follow-up on referral.  CSW spoke with the patient who stated "what do you expect someone to feel if they had a breast removed.  Of course they're going to be depressed sometimes". Patient stated she did not want to come to [facility] to speak with CSW.  CSW stated she did not have to come in but could either meet virtually or speak on the phone.  Patient stated she would like to talk to in the phone. CSW asked patient if she was available to speak or if she would like to speak at some other time.  Patein stated she was not available at this moment but scheduled telephone appointment on 04/12/2021 at 1:30PM. ? ? ? ?Blayke Pinera , LCSW ?

## 2021-04-12 ENCOUNTER — Encounter: Payer: Self-pay | Admitting: Licensed Clinical Social Worker

## 2021-04-12 NOTE — Progress Notes (Signed)
[employer] CSW Progress Note ? ?Clinical Social Worker contacted patient by phone to follow-up on appointment with Oncologist.  Patient stated she is refusing chemotherapy and radiation because she in unable to afford it.  CSW stated  I could connect her with a financial navigator who would speak with her about financial assistance.  Patient stated she is concerned about her current balance with the hospital and she would not be able to pay for chem or radiation.  CSW asked patient if she could afford chemotherapy/radiation would she agree to do it, patient stated she was not sure, but regardless she couldn't afford it.  CSW asked patient if her daughter was still living with her.  Patient stated she and her daughter argued and her daughter left and stated she would not return.  Patient now, lives alone. CSW asked patient if I could contact her son, and patient stated she was afraid he would get fired and that he lived in [place].  Patient stated she fell today in the bathroom.  CSW asked patient if she hit her head and patient stated, she did not and she only hit her knee.  CSW asked patient to contact EMS or son if she experienced sharp pain or weakness, and patient verbalized understanding. ? ?CSW contacted patient's son Modean Mccullum [phone].  CSW and Mr. Gullickson discussed different resources available to the patient for assistance at home.  Mr. Marcus stated the patient has been regularly refusing to take her medication, stating she doesn't like to take medication.  Ms. Mesch stated he has spoken with the patient's insurance company and has confirmed patient would be able to afford chemotherapy and radiation but patient is convinced she cannot afford it.  Mr. Bomkamp states patient's paranoia about finances has been increasing since she has been refusing to take her psychotropic medications regularly. Mr. Lansing stated the patient was referred to a psychiatrist and he is waiting for them to contact him to  schedule an appointment.  Mr. Salasar stated he would also like the patient to be assessed for dementia, since she is increasingly forgetful.  CSW discussed different programs in the area, including the [facility] in [place].  Mr/Richer stated he would like to have the patient evaluated by a psychiatrist and once she is regularly taking her medication he would consider other options.  CSW stated I would follow-uo with the patient next week.  CSW stated if there were concerns for the patient safety I would contact APS or the [employer] to perform a wellness check.  Mr. Kujala verbalized understanding. ? ? ? ?Kingsten Enfield , LCSW ?

## 2021-04-21 ENCOUNTER — Other Ambulatory Visit: Payer: Self-pay

## 2021-04-21 ENCOUNTER — Emergency Department
Admission: EM | Admit: 2021-04-21 | Discharge: 2021-04-21 | Disposition: A | Discharge status: Home / Self Care | Payer: Medicare HMO | Attending: Emergency Medicine | Admitting: Emergency Medicine

## 2021-04-21 DIAGNOSIS — Z853 Personal history of malignant neoplasm of breast: Secondary | ICD-10-CM | POA: Diagnosis not present

## 2021-04-21 DIAGNOSIS — Z87891 Personal history of nicotine dependence: Secondary | ICD-10-CM | POA: Diagnosis not present

## 2021-04-21 DIAGNOSIS — Z20822 Contact with and (suspected) exposure to covid-19: Secondary | ICD-10-CM | POA: Diagnosis not present

## 2021-04-21 DIAGNOSIS — F332 Major depressive disorder, recurrent severe without psychotic features: Secondary | ICD-10-CM | POA: Diagnosis present

## 2021-04-21 DIAGNOSIS — R45851 Suicidal ideations: Secondary | ICD-10-CM | POA: Insufficient documentation

## 2021-04-21 DIAGNOSIS — E039 Hypothyroidism, unspecified: Secondary | ICD-10-CM | POA: Diagnosis not present

## 2021-04-21 LAB — COMPREHENSIVE METABOLIC PANEL
ALT: 14 U/L (ref 0–44)
AST: 20 U/L (ref 15–41)
Albumin: 4 g/dL (ref 3.5–5.0)
Alkaline Phosphatase: 111 U/L (ref 38–126)
Anion gap: 10 (ref 5–15)
BUN: 15 mg/dL (ref 8–23)
CO2: 25 mmol/L (ref 22–32)
Calcium: 9 mg/dL (ref 8.9–10.3)
Chloride: 101 mmol/L (ref 98–111)
Creatinine, Ser: 0.82 mg/dL (ref 0.44–1.00)
GFR, Estimated: >60 mL/min (ref >60)
Glucose, Bld: 104 mg/dL — ABNORMAL HIGH (ref 70–99)
Potassium: 3.7 mmol/L (ref 3.5–5.1)
Sodium: 136 mmol/L (ref 135–145)
Total Bilirubin: 0.5 mg/dL (ref 0.3–1.2)
Total Protein: 7.2 g/dL (ref 6.5–8.1)

## 2021-04-21 LAB — URINE DRUG SCREEN, QUALITATIVE (ARMC ONLY)
Amphetamines, Ur Screen: NOT DETECTED
Barbiturates, Ur Screen: NOT DETECTED
Benzodiazepine, Ur Scrn: NOT DETECTED
Cannabinoid 50 Ng, Ur ~~LOC~~: NOT DETECTED
Cocaine Metabolite,Ur ~~LOC~~: NOT DETECTED
MDMA (Ecstasy)Ur Screen: NOT DETECTED
Methadone Scn, Ur: NOT DETECTED
Opiate, Ur Screen: NOT DETECTED
Phencyclidine (PCP) Ur S: NOT DETECTED
Tricyclic, Ur Screen: NOT DETECTED

## 2021-04-21 LAB — CBC
HCT: 34.7 % — ABNORMAL LOW (ref 36.0–46.0)
Hemoglobin: 11.3 g/dL — ABNORMAL LOW (ref 12.0–15.0)
MCH: 29 pg (ref 26.0–34.0)
MCHC: 32.6 g/dL (ref 30.0–36.0)
MCV: 89 fL (ref 80.0–100.0)
Platelets: 358 10*3/uL (ref 150–400)
RBC: 3.9 MIL/uL (ref 3.87–5.11)
RDW: 14 % (ref 11.5–15.5)
WBC: 6.9 10*3/uL (ref 4.0–10.5)
nRBC: 0 % (ref 0.0–0.2)

## 2021-04-21 LAB — RESP PANEL BY RT-PCR (FLU A&B, COVID) ARPGX2
Influenza A by PCR: NEGATIVE
Influenza B by PCR: NEGATIVE
SARS Coronavirus 2 by RT PCR: NEGATIVE

## 2021-04-21 LAB — ACETAMINOPHEN LEVEL: Acetaminophen (Tylenol), Serum: <10 ug/mL — ABNORMAL LOW (ref 10–30)

## 2021-04-21 LAB — SALICYLATE LEVEL: Salicylate Lvl: <7 mg/dL — ABNORMAL LOW (ref 7.0–30.0)

## 2021-04-21 LAB — ETHANOL: Alcohol, Ethyl (B): <10 mg/dL (ref <10)

## 2021-04-21 NOTE — ED Notes (Signed)
Pt dressed out into burgundy scrubs and pt belongings to include: ? ?1 blue jacket ?1 blue tshirt ?1 pair jeans ?1 pair black sneakers ?1 pair purple underwear ?1 green bra ?1 floral pouch with phone, thyroid medicine, black wallet (wallet contains $154 in cash) ?1 grey ring with red stones, 1 grey ring with pink stones placed in cup in bag, 1 grey necklace ?

## 2021-04-21 NOTE — ED Notes (Signed)
Alinda Money called from [facility] and states that Elizabeth [name] has a room for the patient. TTS has been notified by [employer] education officer. ?
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
## 2021-04-21 NOTE — BH Assessment (Signed)
Patient was seen at [facility] and they found placement for her. She was brought to the ER for transportation to be arranged. Writer followed up with Ellin Mayhew to verify. ? ?Patient has been accepted to [facility].  ?Patient assigned to [facility] ?Accepting physician is Dr. Merrie Roof.  ?Call report to [phone].  ?Representative was [employer].  ? ?ER Staff is aware of it:  ?Soil scientist, Primary school teacher  ?Antionette Fairy., Patient's Nurse ?    ? ? ?Address: ?[address] [place],  ?[place], [place] [address] ?
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
## 2021-04-21 NOTE — ED Notes (Signed)
Patient was cooperative with leaving with [facility], but still stated that she didn't want to go. ?

## 2021-04-21 NOTE — ED Notes (Signed)
Pt wanting to call son [name])- unable to reach him. Pt now speaking with Marchia Bond (friend) to update him on her transfer to [facility]. ?

## 2021-04-26 ENCOUNTER — Encounter: Payer: Self-pay | Admitting: Licensed Clinical Social Worker

## 2021-04-26 NOTE — Progress Notes (Signed)
[place] CSW Progress Note ? ?Clinical Social Worker  returned caregiver's call  to discuss resources for patient after inpatient discharge.  CSW spoke with patient's son Deseree, [facility]) [phone], who stated he is concerned about establishing resources and care for the patient once she is discharge from [facility].  CSW gave Mr. Carlisi information about partial hospitalization programs in the area, private care giver information, available resources and activities at [facility] and [facility]. Mr. Kaner discussed different things he and his spouse are considering to assist the patient after discharge including counseling, and medication management. CSW recommended Mr/ Horne speak with [facility] to discuss plans for treatment with caseworker.  Mr. Pandey stated he would contact [facility].  CSW stated if he has any other concerns or questions to contact me.  Mr. Wirtanen verbalized understanding. ? ? ? ?Mry Lamia , LCSW ?

## 2021-05-03 ENCOUNTER — Encounter: Payer: Self-pay | Admitting: Oncology

## 2021-05-04 ENCOUNTER — Other Ambulatory Visit: Payer: Self-pay | Admitting: Emergency Medicine

## 2021-05-04 DIAGNOSIS — I890 Lymphedema, not elsewhere classified: Secondary | ICD-10-CM

## 2021-05-10 ENCOUNTER — Encounter: Payer: Self-pay | Admitting: Licensed Clinical Social Worker

## 2021-05-10 NOTE — Progress Notes (Signed)
[place] CSW Progress Note ? ?Clinical Social Worker contacted patient by phone to discuss scheduling counseling appointment.  Patient stated she would be available on May 3rd, 2023 at 11:00AM, after her appointment with occupational therapist.  Eunice scheduled patient for appointment. ? ? ? ?Dannya Pitkin , LCSW ?

## 2021-05-14 ENCOUNTER — Encounter: Payer: Self-pay | Admitting: Licensed Clinical Social Worker

## 2021-05-14 NOTE — Progress Notes (Signed)
[place] [facility] CSW Progress Note ? ?Clinical Social Worker  patient contacted CSW by phone  to discuss concerns. Patient stated she is concerned about how often she has to go to the bathroom for a bowel movement.  Patient stated, "the food I eat goes right through me and I have to go to the bathroom about an hour after I eat." Patient and CSW discussed different things the patient could do to find out why she is experiencing more frequent bowel movements, including speaking with her PCP and request a referral to a gastroenterologist. Patient stated she was not sure how she was going to pay for her current medical bills and didn't think she could afford to see more doctors.  CSW recommended patient speak with her PCP who will be able to perform a physical. Patient stated she was not supposed to get a physical until November but her PCP did blood work and the only finding was that she was anemic. CSW asked patient what she would like to do to help her feel better.  Patient stated she would just like to have energy "to do things around the house."  CSW and patient discussed different ways she could manage her fatigue, and still get things done at home, including doing small things, allowing yourself to rest between activities, going outside ans sitting in the fresh air and getting sunshine and taking short walks two or three times a day.  Patient replied she would try these things and would meet with CSW on May 3rd, 2023 at 11:00AM.  ? ? ? ?Madia Carvell , LCSW ?

## 2021-05-22 ENCOUNTER — Inpatient Hospital Stay: Payer: Medicare HMO

## 2021-05-22 ENCOUNTER — Inpatient Hospital Stay: Payer: Medicare HMO | Admitting: Oncology

## 2021-05-23 ENCOUNTER — Inpatient Hospital Stay: Payer: Medicare HMO | Admitting: Licensed Clinical Social Worker

## 2021-05-23 ENCOUNTER — Other Ambulatory Visit: Payer: Self-pay | Admitting: Hospice and Palliative Medicine

## 2021-05-23 ENCOUNTER — Inpatient Hospital Stay: Payer: Medicare HMO | Attending: Oncology | Admitting: Occupational Therapy

## 2021-05-23 ENCOUNTER — Other Ambulatory Visit: Payer: Self-pay

## 2021-05-23 DIAGNOSIS — Z87891 Personal history of nicotine dependence: Secondary | ICD-10-CM | POA: Insufficient documentation

## 2021-05-23 DIAGNOSIS — Z90710 Acquired absence of both cervix and uterus: Secondary | ICD-10-CM | POA: Insufficient documentation

## 2021-05-23 DIAGNOSIS — I890 Lymphedema, not elsewhere classified: Secondary | ICD-10-CM | POA: Insufficient documentation

## 2021-05-23 DIAGNOSIS — Z170 Estrogen receptor positive status [ER+]: Secondary | ICD-10-CM | POA: Insufficient documentation

## 2021-05-23 DIAGNOSIS — F3289 Other specified depressive episodes: Secondary | ICD-10-CM

## 2021-05-23 DIAGNOSIS — Z803 Family history of malignant neoplasm of breast: Secondary | ICD-10-CM | POA: Insufficient documentation

## 2021-05-23 DIAGNOSIS — R45851 Suicidal ideations: Secondary | ICD-10-CM | POA: Insufficient documentation

## 2021-05-23 DIAGNOSIS — C50911 Malignant neoplasm of unspecified site of right female breast: Secondary | ICD-10-CM

## 2021-05-23 NOTE — Progress Notes (Signed)
I spoke with [place], who recommended referral to psychiatry.  Orders placed. ?

## 2021-05-23 NOTE — Progress Notes (Signed)
[place] CSW Progress Note ? ?Clinical Social Worker met with patient to discuss adjustment issues. Patient came with her son who sat in for a portion of the session. Patient is tangential in speech, forgetful and is exhibiting psychomotor retardation and symptoms of severe depression. Throughout the conversation patient returned to her concerns over her weight loss and mastectomy regardless of the conversation topic. Patient is convinced cancer caused her weight loss, patient is currently weighing 115 lbs, and was told by OT she was in the appropriate weight range for her height. Patient stated she is convinced she looks like a man due to mastectomy and weight loss and returned to this line of thought process consistently throughout conversation.  CSW redirected patient several times but she would return to the topic of her concern. When discussing weight loss patient did mention is she only eating two meals a day and sometimes forgets to eat at all. Patient also mentioned she sometimes forgets her night time medications and will take her night time dosage with her morning dosage. 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 Patient also made referral to Palliative Care NP for referral to psychiatry provider and Nutritionist to address meal management concerns with patient. CSW  recommend to patient and son to contact CSW if she had any questions or concerns. ? ? ? ?Zamyra Allensworth LCSW, LCSW ?

## 2021-05-23 NOTE — Therapy (Signed)
[place] ?[facility] ?[address], [address] ?[place], [place], [address] ?Phone: [phone]   Fax:  [phone] ? ?Occupational Therapy Screen: ? ?Patient Details  ?Name: Elizabeth [name] ?MRN: [id number] ?Date of Birth: 1950/09/04 ?No data recorded ? ?Encounter Date: 05/23/2021 ? ? OT End of Session - 05/23/21 1114   ? ? Visit Number 0   ? ?  ?  ? ?  ? ? ?Past Medical History:  ?Diagnosis Date  ? Acute cholecystitis 2016  ? Anemia   ? Anemia 2016  ? Anxiety   ? panic attacks  ? Anxiety and depression   ? Bipolar 1 disorder ([place])   ? Bowel obstruction ([place]) 2017  ? Common bile duct dilatation 2015  ? Dyspnea   ? Edema   ? Fibromyalgia   ? GI bleed 2017  ? History of kidney stones 01/21/2009  ? HSV-1 infection   ? Hyperglycemia   ? Hyperlipidemia   ? Hypokalemia   ? Hypothyroidism   ? Liver cyst   ? Malignant neoplasm of right female breast, unspecified estrogen receptor status, unspecified site of breast ([place])   ? Memory loss   ? Osteopenia   ? Panic disorder   ? Schizophrenia ([place])   ? Ventral hernia   ? ? ?Past Surgical History:  ?Procedure Laterality Date  ? ABDOMINAL HYSTERECTOMY    ? BREAST BIOPSY Right 01/19/2021  ? [place] Bx, ribbon, path pending  ? BREAST BIOPSY Right 01/19/2021  ? [place] Bx, Venus, path pending  ? BREAST BIOPSY Right 01/19/2021  ? [place] Bx, heart, path pending  ? BREAST BIOPSY Right 01/19/2021  ? [place] BX Axilla, Coil, path pending  ? CESAREAN SECTION    ? CHOLECYSTECTOMY OPEN  11/17/2013  ? with repair of bile duct-Dr. Marina Gravel  ? MASTECTOMY MODIFIED RADICAL Right 03/02/2021  ? Procedure: MASTECTOMY MODIFIED RADICAL;  Surgeon: Benjamine Sprague, DO;  Location: [facility] ORS;  Service: General;  Laterality: Right;  ? OPEN REDUCTION INTERNAL FIXATION (ORIF) DISTAL RADIAL FRACTURE Right 12/20/2014  ? Procedure: OPEN REDUCTION INTERNAL FIXATION (ORIF) RIGHT DISTAL RADIAL FRACTURE;  Surgeon: Renette Butters, MD;  Location: [facility];  Service: Orthopedics;   Laterality: Right;  ? STOMACH SURGERY    ? ? ?There were no vitals filed for this visit. ? ? Subjective Assessment - 05/23/21 1112   ? ? Subjective  The swelling in my right arm and breast started about a week or 2 ago.  I did not know if it was after I picked up something heavy like a laundry basket or what.  No pain   ? Currently in Pain? No/denies   ? ?  ?  ? ?  ? ? ? ? ? ? LYMPHEDEMA/ONCOLOGY QUESTIONNAIRE - 05/23/21 0001   ? ?  ? Right Upper Extremity Lymphedema  ? 15 cm Proximal to Olecranon Process 29.5 cm   ? 10 cm Proximal to Olecranon Process 28.5 cm   ? Olecranon Process 26 cm   ? 15 cm Proximal to Ulnar Styloid Process 24.5 cm   ? 10 cm Proximal to Ulnar Styloid Process 21 cm   ? Just Proximal to Ulnar Styloid Process 16.5 cm   ? Across Hand at [employer] 17.5 cm   ?  ? Left Upper Extremity Lymphedema  ? 15 cm Proximal to Olecranon Process 26.5 cm   ? 10 cm Proximal to Olecranon Process 26.5 cm   ? Olecranon Process 24 cm   ?  15 cm Proximal to Ulnar Styloid Process 21.4 cm   ? 10 cm Proximal to Ulnar Styloid Process 17 cm   ? Just Proximal to Ulnar Styloid Process 14 cm   ? Across Hand at [employer] 17 cm   ? ?  ?  ? ?  ? ? ? 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 ?  ?PLAN:   ?  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  She underwent full mastectomy on December 30, 2021.  Despite recommendation of adjuvant Adriamycin and Cytoxan followed by Taxol, patient continues to be adamant that she will not undergo chemotherapy.  She has highly hesitant to pursue adjuvant XRT as well.  Given her ER/PR status of her tumor, it was recommended patient also take letrozole which she is considering.  No intervention is needed at this time.   Patient will follow-up in 3 weeks for further evaluation and additional treatment planning.  ?Postoperative pain: Patient was given a prescription for hydrocodone today. ?Lymphedema: Patient was given a referral to lymphedema clinic as well has an appointment with occupational therapy. ?  ?  ?Patient expressed understanding and was in agreement with this plan. She also understands that She can call clinic at any time with any questions, concerns, or complaints.  ?  ? Cancer Staging  ?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[place]) ?Staging form: Breast, AJCC 8th Edition ?- Pathologic stage from 03/14/2021: Stage IIIB (pT2, pN3a, cM0, G3, ER+, PR+, HER2-) - Signed by [name], MD on 03/14/2021 ?Stage prefix: Initial diagnosis ?Histologic grading system: 3 grade system ?  ?  ? ? ? ? ? ? OT SCREEN: ?Patient was referred by surgeon on 03/14/2021 to lymphedema clinic with me.  But patient no showed/or canceled. ?Patient this date referred by Dr. Grayland Ormond for rehab screen for right upper extremity lymphedema. ?Patient is about 10 weeks postop right mastectomy with 11-13 lymph nodes removed.  Patient report swelling started about 2 weeks ago in the right breast and arm.  Upon assessment it appears swelling this started little bit earlier coming and going but staying more in the arm and chest the last week to 2.  Appear patient has stage II lymphedema in the right upper extremity and thoracic requiring CDT by lymphedema therapist.  Patient's right upper extremity is increase by about 3 cm in the upper arm, 2 cm at the elbow, 4 cm and forearm and wrist 2.5 cm.  Patient is right-hand dominant and reports doing laundry and some cleaning around the house but not cooking.  She do walk some per patient. ?Patient has transportation issues.  Son was with patient today and reported he can bring her tomorrow for evaluation  - OT plan to start bandaging patient's right upper extremity tomorrow.  He will be next week in town  and be able to bring her to 3 sessions. ? ? ? ? ? ? ? ? ? ? ? ? ? ? ? ? ? ? ? ? ? ? ?Patient will benefit from skilled therapeutic intervention in order to improve the following deficits and impairments:   ?  ?  ?  ? ? ?Visit Diagnosis: ?Lymphedema ? ? ? ?Problem List ?Patient Active Problem List  ? Diagnosis Date Noted  ?  Major depressive disorder, recurrent severe without psychotic features ([place]) 04/21/2021  ? 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[place]) 01/28/2021  ? 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  ? Medical non-compliance 08/17/2020  ? Hepatic abscess 01/10/2016  ? History of diabetes mellitus, type II 11/04/2014  ? History of essential hypertension 11/04/2014  ? Intra-abdominal and pelvic swelling, mass and lump, unspecified site 11/03/2013  ? ? ?Rosalyn Gess, OTR/L,CLT ?05/23/2021, 11:17 AM ? ?[place] ?[facility] ?[address], [address] ?[place], [place], [address] ?Phone: [phone]   Fax:  [phone] ? ?Name: Elizabeth [name] ?MRN: [id number] ?Date of Birth: 12-Jun-1950 ? ?

## 2021-05-24 ENCOUNTER — Ambulatory Visit: Payer: Medicare HMO | Attending: Oncology | Admitting: Occupational Therapy

## 2021-05-24 ENCOUNTER — Encounter: Payer: Self-pay | Admitting: Occupational Therapy

## 2021-05-24 DIAGNOSIS — I972 Postmastectomy lymphedema syndrome: Secondary | ICD-10-CM | POA: Diagnosis present

## 2021-05-24 DIAGNOSIS — L905 Scar conditions and fibrosis of skin: Secondary | ICD-10-CM | POA: Diagnosis present

## 2021-05-24 NOTE — Therapy (Signed)
[place] [facility] ?[address] ?[place], [place], [address] ?Phone: [phone]   Fax:  [phone] ? ?Occupational Therapy Evaluation ? ?Patient Details  ?Name: Elizabeth [name] ?MRN: [id number] ?Date of Birth: 11/08/1950 ?Referring Provider (OT): Dr Grayland Ormond ? ? ?Encounter Date: 05/24/2021 ? ? OT End of Session - 05/24/21 1301   ? ? Visit Number 1   ? Number of Visits 12   ? Date for OT Re-Evaluation 07/05/21   ? OT Start Time 1200   ? OT Stop Time 1240   ? OT Time Calculation (min) 40 min   ? Activity Tolerance Patient tolerated treatment well   ? Behavior During Therapy [facility] for tasks assessed/performed   ? ?  ?  ? ?  ? ? ?Past Medical History:  ?Diagnosis Date  ? Acute cholecystitis 2016  ? Anemia   ? Anemia 2016  ? Anxiety   ? panic attacks  ? Anxiety and depression   ? Bipolar 1 disorder ([place])   ? Bowel obstruction ([place]) 2017  ? Common bile duct dilatation 2015  ? Dyspnea   ? Edema   ? Fibromyalgia   ? GI bleed 2017  ? History of kidney stones 01/21/2009  ? HSV-1 infection   ? Hyperglycemia   ? Hyperlipidemia   ? Hypokalemia   ? Hypothyroidism   ? Liver cyst   ? Malignant neoplasm of right female breast, unspecified estrogen receptor status, unspecified site of breast ([place])   ? Memory loss   ? Osteopenia   ? Panic disorder   ? Schizophrenia ([place])   ? Ventral hernia   ? ? ?Past Surgical History:  ?Procedure Laterality Date  ? ABDOMINAL HYSTERECTOMY    ? BREAST BIOPSY Right 01/19/2021  ? [place] Bx, ribbon, path pending  ? BREAST BIOPSY Right 01/19/2021  ? [place] Bx, Venus, path pending  ? BREAST BIOPSY Right 01/19/2021  ? [place] Bx, heart, path pending  ? BREAST BIOPSY Right 01/19/2021  ? [place] BX Axilla, Coil, path pending  ? CESAREAN SECTION    ? CHOLECYSTECTOMY OPEN  11/17/2013  ? with repair of bile duct-Dr. Marina Gravel  ? MASTECTOMY MODIFIED RADICAL Right 03/02/2021  ? Procedure: MASTECTOMY MODIFIED RADICAL;  Surgeon: Benjamine Sprague, DO;  Location: [facility] ORS;  Service:  General;  Laterality: Right;  ? OPEN REDUCTION INTERNAL FIXATION (ORIF) DISTAL RADIAL FRACTURE Right 12/20/2014  ? Procedure: OPEN REDUCTION INTERNAL FIXATION (ORIF) RIGHT DISTAL RADIAL FRACTURE;  Surgeon: Renette Butters, MD;  Location: [place];  Service: Orthopedics;  Laterality: Right;  ? STOMACH SURGERY    ? ? ?There were no vitals filed for this visit. ? ? Subjective Assessment - 05/24/21 1255   ? ? Subjective  I am ready my son will not be in until Sunday.  I have this longsleeve on today but I can slide it up.   ? 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     PLAN:       1. 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  She underwent full mastectomy on December 30, 2021.  Despite recommendation of adjuvant Adriamycin and Cytoxan followed by Taxol, patient continues to be adamant that she will not undergo chemotherapy.  She  has highly hesitant to pursue adjuvant XRT as well.  Given her ER/PR status of her tumor, it was recommended patient also take letrozole which she is considering.  No intervention is needed at this time.  Patient will follow-up in 3 weeks for further evaluation and additional treatment planning.   2. Postoperative pain: Patient was given a prescription for hydrocodone today.  3. Lymphedema: Patient was given a referral to lymphedema clinic as well has an appointment with occupational therapy.   ? Patient Stated Goals To get my right arm smaller and prevent infection   ? Currently in Pain? No/denies   ? ?  ?  ? ?  ? ? ? ? [place] OT Assessment - 05/24/21 0001   ? ?  ? Assessment  ? Medical Diagnosis R UE and thoracic lymphedema   ? Referring Provider (OT) Dr Grayland Ormond   ? Onset Date/Surgical Date  03/21/21   ? Hand Dominance Right   ?  ? Precautions  ? Precaution Comments Lymphedema precautions   ?  ? Home  Environment  ? Lives With Alone   But family is in and out.  Son today with her  ?  ? Prior Function  ? Vocation Retired   ? Leisure Does do some laundry and housework, walk a little bit   ?  ? AROM  ? Overall AROM Comments Shoulder active range of motion appear to be within functional limits.   ? ?  ?  ? ?  ? ? ? LYMPHEDEMA/ONCOLOGY QUESTIONNAIRE - 05/24/21 0001   ? ?  ? Type  ? Cancer Type R breast CAncer   ?  ? Surgeries  ? Mastectomy Date 03/02/21   ? Number Lymph Nodes Removed --   11-13  ?  ? Right Upper Extremity Lymphedema  ? 15 cm Proximal to Olecranon Process 28.2 cm   ? 10 cm Proximal to Olecranon Process 28 cm   ? Olecranon Process 26.5 cm   ? 15 cm Proximal to Ulnar Styloid Process 25 cm   ? 10 cm Proximal to Ulnar Styloid Process 21 cm   ? Just Proximal to Ulnar Styloid Process 16.5 cm   ? Across Hand at [employer] 17.5 cm   ?  ? Left Upper Extremity Lymphedema  ? 15 cm Proximal to Olecranon Process 26.5 cm   ? 10 cm Proximal to Olecranon Process 26.5 cm   ? Olecranon Process 24 cm   ? 15 cm Proximal to Ulnar Styloid Process 21.4 cm   ? 10 cm Proximal to Ulnar Styloid Process 17 cm   ? Just Proximal to Ulnar Styloid Process 14 cm   ? Across Hand at [employer] 17 cm   ? ?  ?  ? ?  ? ? ? ?  Patient and son was educated on lymphatic bandaging for right upper extremity.  Eucerin lotion was applied.  With Isotoner glove ?Soft stockinette applied to right upper extremity ?Artiflex 10 cm  applied with 3 cycles over wrist and through thumb and up to elbow ?Artiflex 15 cm applied at wrist 1 time through the thumb and then up overlapping 50% to upper arm ?With  Topper of Rosidal foam at the proximal upper arm. ?6 cm short stretch bandage 3 cycles around the wrist through thumb alternating and up to forearm overlapping 50% ?8 cm at wrist 1 time through the thumb overlapping 50% up to proximal  to elbow ?And then 10 cm overlapping 50% from mid forearm  to proximal upper arm  ?Patient and son educated on precautions and when to remove or rewrap bandages. ?Reviewed several times and given written steps for home program for 3 times a day 10 reps for active digit flexion and extension, wrist extension and flexion, supination pronation, with elbow flexion extension. ?In shoulder flexion and D1-D2 patterns if needed in supine. ? ? ? ? ? ? ? ? ? ? ? ? ? OT Education - 05/24/21 1301   ? ? Education Details Patient and son was educated on lymphedema treatment signs and symptoms as well as bandaging and precautions   ? Person(s) Educated Patient;Child(ren)   ? Methods Explanation;Demonstration;Verbal cues;Handout;Tactile cues   ? Comprehension Verbal cues required;Tactile cues required;Returned demonstration;Verbalized understanding   ? ?  ?  ? ?  ? ? ? ? ? ? OT Long Term Goals - 05/24/21 1306   ? ?  ? OT LONG TERM GOAL #1  ? Title Patient to be independent in home program to tolerate and wearing of compression bandaging to decrease circumference in the right upper extremity to get measured for compression garments   ? Baseline Patient had never done bandaging before and showing right upper extremity and thoracic lymphedema.  Upper arm increased by 3 cm and elbow and forearm 3 to 4 cm and wrist 2.5 cm   ? Time 2   ? Period Weeks   ? Status New   ? Target Date 06/07/21   ?  ? OT LONG TERM GOAL #2  ? Title Patient to be independent and wearing of compression garment to decrease and maintain right upper extremity circumference.   ? Baseline Patient increased by 3 to 4 cm in right upper extremity compared to left.  Has no pump or compression done in the past.  No knowledge   ? Time 6   ? Period Weeks   ? Status New   ? Target Date 07/05/21   ?  ? OT LONG TERM GOAL #3  ? Title Patient to be independent in use of pneumatic compression pump to supplement daytime compression to maintain circumference and decrease future  infection in right upper extremity   ? Baseline Assess if patient can be fitted with a pneumatic pump has no knowledge would not be able to tolerate a nighttime garment because of mental health would recommend

## 2021-05-26 NOTE — Progress Notes (Signed)
?[place]  ?[phone] [phone] ? ?ID: Elizabeth [name] OB: October 07, 1950  MR#: [id number]  JYN#:[id number] ? ?Patient Care Team: ?Sharyne Peach, MD as PCP - General (Family Medicine) ?Rico Junker, RN as Oncology Nurse Navigator ?[name], MD as Consulting Physician (Hematology and Oncology) ? 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 ? ?INTERVAL HISTORY: Patient returns to clinic today for routine evaluation and assess her toleration of letrozole.  She was recently evaluated in the emergency room with suicide ideation, but did not require admission to behavioral health.  She currently feels well.  She is tolerating letrozole without significant side effects.  Her right arm is in a wrap secondary to lymphedema.  She has no neurologic complaints.  She denies any recent fevers or illnesses.  She has a good appetite and denies weight loss.  She has no chest pain, shortness of breath, cough, or hemoptysis.  She denies any nausea, vomiting, constipation, or diarrhea.  She has no urinary complaints.  Patient offers no further specific complaints today. ? ?REVIEW OF SYSTEMS:   ?Review of Systems  ?Constitutional: Negative.  Negative for fever, malaise/fatigue and weight loss.  ?Respiratory: Negative.  Negative for cough, hemoptysis and shortness of breath.   ?Cardiovascular: Negative.  Negative for chest pain and leg swelling.  ?Gastrointestinal: Negative.  Negative for abdominal pain.  ?Genitourinary: Negative.  Negative for dysuria.  ?Musculoskeletal: Negative.  Negative for back pain.  ?Skin: Negative.  Negative for rash.  ?Neurological: Negative.  Negative for dizziness, sensory change, focal weakness, weakness and headaches.  ?Psychiatric/Behavioral:  The patient is not nervous/anxious.   ? ?As per HPI. Otherwise, a complete review of systems is negative. ? ?PAST MEDICAL  HISTORY: ?Past Medical History:  ?Diagnosis Date  ? Acute cholecystitis 2016  ? Anemia   ? Anemia 2016  ? Anxiety   ? panic attacks  ? Anxiety and depression   ? Bipolar 1 disorder ([place])   ? Bowel obstruction ([place]) 2017  ? Common bile duct dilatation 2015  ? Dyspnea   ? Edema   ? Fibromyalgia   ? GI bleed 2017  ? History of kidney stones 01/21/2009  ? HSV-1 infection   ? Hyperglycemia   ? Hyperlipidemia   ? Hypokalemia   ? Hypothyroidism   ? Liver cyst   ? Malignant neoplasm of right female breast, unspecified estrogen receptor status, unspecified site of breast ([place])   ? Memory loss   ? Osteopenia   ? Panic disorder   ? Schizophrenia ([place])   ? Ventral hernia   ? ? ?PAST SURGICAL HISTORY: ?Past Surgical History:  ?Procedure Laterality Date  ? ABDOMINAL HYSTERECTOMY    ? BREAST BIOPSY Right 01/19/2021  ? [place] Bx, ribbon, path pending  ? BREAST BIOPSY Right 01/19/2021  ? [place] Bx, Venus, path pending  ? BREAST BIOPSY Right 01/19/2021  ? [place] Bx, heart, path pending  ? BREAST BIOPSY Right 01/19/2021  ? [place] BX Axilla, Coil, path pending  ? CESAREAN SECTION    ? CHOLECYSTECTOMY OPEN  11/17/2013  ? with repair of bile duct-Dr. Marina Gravel  ? MASTECTOMY MODIFIED RADICAL Right 03/02/2021  ? Procedure: MASTECTOMY MODIFIED RADICAL;  Surgeon: Benjamine Sprague, DO;  Location: [facility] ORS;  Service: General;  Laterality: Right;  ? OPEN REDUCTION INTERNAL FIXATION (ORIF) DISTAL RADIAL FRACTURE Right 12/20/2014  ? Procedure: OPEN REDUCTION INTERNAL FIXATION (ORIF) RIGHT DISTAL RADIAL FRACTURE;  Surgeon: Renette Butters, MD;  Location: [place] [place];  Service: Orthopedics;  Laterality: Right;  ? STOMACH SURGERY    ? ? ?FAMILY HISTORY: ?Family History  ?Problem Relation Age of Onset  ? Heart disease Father   ? Breast cancer Paternal Grandmother   ? Asthma Daughter   ? Breast cancer Paternal Aunt   ? Kidney disease Paternal Aunt   ? Bladder Cancer Neg Hx   ? ? ?ADVANCED DIRECTIVES (Y/N):  N ? ?HEALTH MAINTENANCE: ?Social History  ? ?Tobacco  Use  ? Smoking status: Former  ?  Packs/day: 0.00  ?  Years: 0.00  ?  Pack years: 0.00  ?  Types: Cigarettes  ?  Quit date: 11/03/1973  ?  Years since quitting: 47.6  ?  Passive exposure: Past  ? Smokeless tobacco: Never  ? Tobacco comments:  ?  Smoked briefly as a teen- lives in secondhand smoke.   ?Vaping Use  ? Vaping Use: Never used  ?Substance Use Topics  ? Alcohol use: No  ?  Alcohol/week: 0.0 standard drinks  ? Drug use: No  ? ? ? Colonoscopy: ? PAP: ? Bone density: ? Lipid panel: ? ?Allergies  ?Allergen Reactions  ? Penicillins Hives  ?  TOLERATED CEFAZOLIN  ? ? ?Current Outpatient Medications  ?Medication Sig Dispense Refill  ? alendronate (FOSAMAX) 70 MG tablet Take 1 tablet by mouth every Monday.    ? atorvastatin (LIPITOR) 20 MG tablet Take 20 mg by mouth daily.    ? clonazePAM (KLONOPIN) 1 MG tablet Take 1 mg by mouth 2 (two) times daily as needed for anxiety.    ? cyanocobalamin 1000 MCG tablet Take by mouth.    ? divalproex (DEPAKOTE) 125 MG DR tablet Take by mouth.    ? ergocalciferol (VITAMIN D2) 1.25 MG (50000 UT) capsule Take by mouth.    ? ferrous sulfate 325 (65 FE) MG tablet Take 325 mg by mouth daily with breakfast.    ? letrozole (FEMARA) 2.5 MG tablet Take 1 tablet (2.5 mg total) by mouth daily. 90 tablet 3  ? levothyroxine (SYNTHROID, LEVOTHROID) 88 MCG tablet Take 88 mcg by mouth daily before breakfast.    ? ondansetron (ZOFRAN-ODT) 4 MG disintegrating tablet Take 1 tablet (4 mg total) by mouth every 8 (eight) hours as needed. 20 tablet 3  ? ziprasidone (GEODON) 40 MG capsule Take by mouth.    ? celecoxib (CELEBREX) 200 MG capsule Take 200 mg by mouth 2 (two) times daily. (Patient not taking: Reported on 05/30/2021)    ? HYDROcodone-acetaminophen (NORCO) 5-325 MG tablet Take 1 tablet by mouth every 6 (six) hours as needed for moderate pain. (Patient not taking: Reported on 05/30/2021) 30 tablet 0  ? ibuprofen (ADVIL) 400 MG tablet Take 1 tablet (400 mg total) by mouth every 8 (eight) hours  as needed for mild pain or moderate pain. (Patient not taking: Reported on 03/14/2021) 30 tablet 0  ? ?No current facility-administered medications for this visit.  ? ? ?OBJECTIVE: ?Vitals:  ? 05/30/21 1029  ?BP: 95/62  ?Pulse: 89  ?Resp: 16  ?Temp: (!) 96 ?F (35.6 ?C)  ?SpO2: 100%  ?   Body mass index is 22.11 kg/m?Marland Kitchen    ECOG FS:0 - Asymptomatic ? ?General: Well-developed, well-nourished, no acute distress. ?Eyes: Pink conjunctiva, anicteric sclera. ?HEENT: Normocephalic, moist mucous membranes. ?Breast: Right mastectomy. ?Lungs: No audible wheezing or coughing. ?Heart: Regular rate and rhythm. ?Abdomen: Soft, nontender, no obvious distention. ?Musculoskeletal: Right arm in lymphedema wrap. ?Neuro: Alert, answering all questions  appropriately. Cranial nerves grossly intact. ?Skin: No rashes or petechiae noted. ?Psych: Normal affect. ? ? ? ?LAB RESULTS: ? ?Lab Results  ?Component Value Date  ? NA 136 04/21/2021  ? K 3.7 04/21/2021  ? CL 101 04/21/2021  ? CO2 25 04/21/2021  ? GLUCOSE 104 (H) 04/21/2021  ? BUN 15 04/21/2021  ? CREATININE 0.82 04/21/2021  ? CALCIUM 9.0 04/21/2021  ? PROT 7.2 04/21/2021  ? ALBUMIN 4.0 04/21/2021  ? AST 20 04/21/2021  ? ALT 14 04/21/2021  ? ALKPHOS 111 04/21/2021  ? BILITOT 0.5 04/21/2021  ? GFRNONAA >60 04/21/2021  ? GFRAA 28 (L) 12/18/2015  ? ? ?Lab Results  ?Component Value Date  ? WBC 6.9 04/21/2021  ? NEUTROABS 8.1 (H) 11/20/2013  ? HGB 11.3 (L) 04/21/2021  ? HCT 34.7 (L) 04/21/2021  ? MCV 89.0 04/21/2021  ? PLT 358 04/21/2021  ? ? ? ?STUDIES: ?No results found. ? 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 ? ?PLAN:   ? 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  She underwent full mastectomy on December 30, 2021.  Despite recommendations patient continues to  adamantly refuse adjuvant chemotherapy or radiation therapy.  She expressed understanding that this increases her rate of recurrence.  Continue letrozole for a minimum of 5 years completing treatment in March 2028, bu

## 2021-05-28 ENCOUNTER — Ambulatory Visit: Payer: Medicare HMO | Admitting: Occupational Therapy

## 2021-05-28 DIAGNOSIS — I972 Postmastectomy lymphedema syndrome: Secondary | ICD-10-CM | POA: Diagnosis not present

## 2021-05-28 DIAGNOSIS — L905 Scar conditions and fibrosis of skin: Secondary | ICD-10-CM

## 2021-05-28 NOTE — Therapy (Signed)
[place] [facility] [address] [place], [place], [address] Phone: [phone]   Fax:  [phone]  Occupational Therapy Treatment  Patient Details  Name: JAVANNA SHALLCROSS MRN: [id number] Date of Birth: April 05, 1950 Referring Provider (OT): Dr Orlie Dakin   Encounter Date: 05/28/2021   OT End of Session - 05/28/21 1605     Visit Number 2    Number of Visits 12    Date for OT Re-Evaluation 07/05/21    OT Start Time 1602    OT Stop Time 1640    OT Time Calculation (min) 38 min    Activity Tolerance Patient tolerated treatment well    Behavior During Therapy [facility] for tasks assessed/performed             Past Medical History:  Diagnosis Date   Acute cholecystitis 2016   Anemia    Anemia 2016   Anxiety    panic attacks   Anxiety and depression    Bipolar 1 disorder (HCC)    Bowel obstruction (HCC) 2017   Common bile duct dilatation 2015   Dyspnea    Edema    Fibromyalgia    GI bleed 2017   History of kidney stones 01/21/2009   HSV-1 infection    Hyperglycemia    Hyperlipidemia    Hypokalemia    Hypothyroidism    Liver cyst    Malignant neoplasm of right female breast, unspecified estrogen receptor status, unspecified site of breast (HCC)    Memory loss    Osteopenia    Panic disorder    Schizophrenia (HCC)    Ventral hernia     Past Surgical History:  Procedure Laterality Date   ABDOMINAL HYSTERECTOMY     BREAST BIOPSY Right 01/19/2021   [place] Bx, ribbon, path pending   BREAST BIOPSY Right 01/19/2021   [place] Bx, Venus, path pending   BREAST BIOPSY Right 01/19/2021   [place] Bx, heart, path pending   BREAST BIOPSY Right 01/19/2021   [place] BX Axilla, Coil, path pending   CESAREAN SECTION     CHOLECYSTECTOMY OPEN  11/17/2013   with repair of bile duct-Dr. Egbert Garibaldi   MASTECTOMY MODIFIED RADICAL Right 03/02/2021   Procedure: MASTECTOMY MODIFIED RADICAL;  Surgeon: Sung Amabile, DO;  Location: [facility] ORS;  Service:  General;  Laterality: Right;   OPEN REDUCTION INTERNAL FIXATION (ORIF) DISTAL RADIAL FRACTURE Right 12/20/2014   Procedure: OPEN REDUCTION INTERNAL FIXATION (ORIF) RIGHT DISTAL RADIAL FRACTURE;  Surgeon: Sheral Apley, MD;  Location: [facility];  Service: Orthopedics;  Laterality: Right;   STOMACH SURGERY      There were no vitals filed for this visit.   Subjective Assessment - 05/28/21 1604     Subjective  I do not want to be bandage again what will happen if I do not manage.  My elbow started to itch after I washed it and I scratched a lot.    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     PLAN:       1. 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  She underwent full mastectomy on December 30, 2021.  Despite recommendation of adjuvant Adriamycin and Cytoxan followed by Taxol, patient continues to be adamant that  she will not undergo chemotherapy.  She has highly hesitant to pursue adjuvant XRT as well.  Given her ER/PR status of her tumor, it was recommended patient also take letrozole which she is considering.  No intervention is needed at this time.  Patient will follow-up in 3 weeks for further evaluation and additional treatment planning.   2. Postoperative pain: Patient was given a prescription for hydrocodone today.  3. Lymphedema: Patient was given a referral to lymphedema clinic as well has an appointment with occupational therapy.    Patient Stated Goals To get my right arm smaller and prevent infection    Currently in Pain? No/denies                 LYMPHEDEMA/ONCOLOGY QUESTIONNAIRE - 05/28/21 0001       Right Upper Extremity Lymphedema   15 cm Proximal to Olecranon Process 28.5  cm    10 cm Proximal to Olecranon Process 28 cm    Olecranon Process 25.5 cm    15 cm Proximal to Ulnar Styloid Process 25 cm    10 cm Proximal to Ulnar Styloid Process 20 cm    Just Proximal to Ulnar Styloid Process 15 cm    Across Hand at [employer] 17 cm               Patient arrived with with son and bandages per patient and son was taken off about 2 hours ago and patient take a shower. Patient with some redness in the volar elbow and reported that she scratched it after shower because of itchy. Measurements taken of circumference see flowsheet  Patient and son was educated on lymphatic bandaging for right upper extremity.  Son did bring some cortisone ointment was applied over volar elbow together with Eucerin lotion.  Son can reapply them in the next 48 hours if needed.  Applied Isotoner glove New soft stockinette applied to right upper extremity Artiflex 10 cm  applied with 3 cycles over wrist and through thumb and up to elbow Artiflex 15 cm applied at wrist 1 time through the thumb and then up overlapping 50% to upper arm With  Topper of Rosidal foam at the proximal upper arm. 6 cm short stretch bandage 3 cycles around the wrist through thumb alternating and up to forearm overlapping 50% 8 cm at wrist 1 time through the thumb overlapping 50% up to proximal to elbow And then 10 cm overlapping 50% from mid forearm to proximal upper arm   Patient and son educated on precautions and when to remove or rewrap bandages. Reviewed several times and given written steps for home program for 3 times a day 10 reps for active digit flexion and extension, wrist extension and flexion, supination pronation, with elbow flexion extension. In shoulder flexion and D1-D2 patterns if needed in supine. Return in 48 hours to reassess circumference and if can be sent for measurement for compression garment.  We will reach out to pump rep again tomorrow                   OT Education  - 05/28/21 1605     Education Details Patient and son was educated on lymphedema treatment signs and symptoms as well as bandaging and precautions    Person(s) Educated Patient;Child(ren)    Methods Explanation;Demonstration;Verbal cues;Handout;Tactile cues    Comprehension Verbal cues required;Tactile cues required;Returned demonstration;Verbalized understanding                 OT  Long Term Goals - 05/24/21 1306       OT LONG TERM GOAL #1   Title Patient to be independent in home program to tolerate and wearing of compression bandaging to decrease circumference in the right upper extremity to get measured for compression garments    Baseline Patient had never done bandaging before and showing right upper extremity and thoracic lymphedema.  Upper arm increased by 3 cm and elbow and forearm 3 to 4 cm and wrist 2.5 cm    Time 2    Period Weeks    Status New    Target Date 06/07/21      OT LONG TERM GOAL #2   Title Patient to be independent and wearing of compression garment to decrease and maintain right upper extremity circumference.    Baseline Patient increased by 3 to 4 cm in right upper extremity compared to left.  Has no pump or compression done in the past.  No knowledge    Time 6    Period Weeks    Status New    Target Date 07/05/21      OT LONG TERM GOAL #3   Title Patient to be independent in use of pneumatic compression pump to supplement daytime compression to maintain circumference and decrease future infection in right upper extremity    Baseline Assess if patient can be fitted with a pneumatic pump has no knowledge would not be able to tolerate a nighttime garment because of mental health would recommend the pump to use morning and evening    Time 6    Period Weeks    Status New    Target Date 07/05/21                   Plan - 05/28/21 1605     Clinical Impression Statement Patient  referred by Dr. Orlie Dakin for right upper extremity lymphedema.   Patient is about 10 weeks postop right mastectomy with 11-13 lymph nodes removed.  Patient report swelling started about 3 weeks ago in the right breast and arm.  Upon assessment at eval it appears swelling date started little bit earlier coming and going but staying more in the arm and chest the last week to 2.  Appear patient has stage II lymphedema in the right upper extremity and thoracic requiring CDT by lymphedema therapist.  Patient's right upper extremity is increase by about 3 cm in the upper arm, 2 cm at the elbow, 4 cm and forearm and wrist 2.5 cm at eval - but this date after R UE was bandage for 4 days- pt wrist to elbow did decrease but proximal forearm and upper arm still increased. Pt was red and itchy in volar elbow this date after she was bandage for 4 days. Pt was agreeing to bandage again after applying some cortisone ointment and Eucerin lotion to volar elbow prior to bandaging.  Reinforced again with patient it's only going to be 48 hours and should be much better.  Patient is right-hand dominant and reports doing laundry and some cleaning around the house but not cooking.  She do walk some per patient.  Patient has transportation issues.  Son is in town this week and was with her and able to bring her to 3 sessions this week.   Because of patient's mental health OT is planning to get patient decongested by end of week and fitted with over-the-counter compression sleeve and glove.  As well as using a pneumatic compression pump  morning and evening.  Patient can benefit from skilled OT services to decongest right upper extremity and thoracic to decrease lymphedema circumference and getting fitted with compression garments and a home program to prevent future cellulitis issues.    OT Occupational Profile and History Problem Focused Assessment - Including review of records relating to presenting problem    Occupational performance deficits (Please refer to evaluation for details):  ADL's;IADL's;Leisure;Play    Body Structure / Function / Physical Skills ADL;Decreased knowledge of precautions;Flexibility;Scar mobility;IADL;Edema    Rehab Potential Good    Clinical Decision Making Limited treatment options, no task modification necessary    Comorbidities Affecting Occupational Performance: None    Modification or Assistance to Complete Evaluation  No modification of tasks or assist necessary to complete eval    OT Frequency 3x / week    OT Duration 6 weeks    OT Treatment/Interventions Self-care/ADL training;Manual lymph drainage;DME and/or AE instruction;Compression bandaging;Scar mobilization;Manual Therapy;Patient/family education    Consulted and Agree with Plan of Care Patient             Patient will benefit from skilled therapeutic intervention in order to improve the following deficits and impairments:   Body Structure / Function / Physical Skills: ADL, Decreased knowledge of precautions, Flexibility, Scar mobility, IADL, Edema       Visit Diagnosis: Postmastectomy lymphedema syndrome  Scar condition and fibrosis of skin    Problem List Patient Active Problem List   Diagnosis Date Noted   Major depressive disorder, recurrent severe without psychotic features (HCC) 04/21/2021   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HCC) 01/28/2021   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, [place], [address] Phone: [phone]   Fax:  [phone]  Name: BROOK ZEINER MRN: [id number] Date of Birth: July 05, 1950

## 2021-05-30 ENCOUNTER — Encounter: Payer: Self-pay | Admitting: Oncology

## 2021-05-30 ENCOUNTER — Inpatient Hospital Stay (HOSPITAL_BASED_OUTPATIENT_CLINIC_OR_DEPARTMENT_OTHER): Payer: Medicare HMO | Admitting: Oncology

## 2021-05-30 ENCOUNTER — Ambulatory Visit: Payer: Medicare HMO | Admitting: Occupational Therapy

## 2021-05-30 VITALS — BP 95/62 | HR 89 | Temp 96.0°F | Resp 16 | Ht 61.0 in | Wt 117.0 lb

## 2021-05-30 DIAGNOSIS — L905 Scar conditions and fibrosis of skin: Secondary | ICD-10-CM

## 2021-05-30 DIAGNOSIS — C50911 Malignant neoplasm of unspecified site of right female breast: Secondary | ICD-10-CM

## 2021-05-30 DIAGNOSIS — I890 Lymphedema, not elsewhere classified: Secondary | ICD-10-CM | POA: Diagnosis not present

## 2021-05-30 DIAGNOSIS — Z803 Family history of malignant neoplasm of breast: Secondary | ICD-10-CM | POA: Diagnosis not present

## 2021-05-30 DIAGNOSIS — I972 Postmastectomy lymphedema syndrome: Secondary | ICD-10-CM | POA: Diagnosis not present

## 2021-05-30 DIAGNOSIS — Z170 Estrogen receptor positive status [ER+]: Secondary | ICD-10-CM | POA: Diagnosis not present

## 2021-05-30 DIAGNOSIS — R45851 Suicidal ideations: Secondary | ICD-10-CM | POA: Diagnosis not present

## 2021-05-30 DIAGNOSIS — Z90710 Acquired absence of both cervix and uterus: Secondary | ICD-10-CM | POA: Diagnosis not present

## 2021-05-30 DIAGNOSIS — Z87891 Personal history of nicotine dependence: Secondary | ICD-10-CM | POA: Diagnosis not present

## 2021-05-30 NOTE — Therapy (Signed)
[place] [facility] ?[address] ?[place], [place], [address] ?Phone: [phone]   Fax:  [phone] ? ?Occupational Therapy Treatment ? ?Patient Details  ?Name: Elizabeth [name] ?MRN: [id number] ?Date of Birth: 1950-05-28 ?Referring Provider (OT): Dr Grayland Ormond ? ? ?Encounter Date: 05/30/2021 ? ? OT End of Session - 05/30/21 1438   ? ? Visit Number 3   ? Number of Visits 12   ? Date for OT Re-Evaluation 07/05/21   ? OT Start Time 1440   ? OT Stop Time 1525   ? OT Time Calculation (min) 45 min   ? Activity Tolerance Patient tolerated treatment well   ? Behavior During Therapy [facility] for tasks assessed/performed   ? ?  ?  ? ?  ? ? ?Past Medical History:  ?Diagnosis Date  ? Acute cholecystitis 2016  ? Anemia   ? Anemia 2016  ? Anxiety   ? panic attacks  ? Anxiety and depression   ? Bipolar 1 disorder ([place])   ? Bowel obstruction ([place]) 2017  ? Common bile duct dilatation 2015  ? Dyspnea   ? Edema   ? Fibromyalgia   ? GI bleed 2017  ? History of kidney stones 01/21/2009  ? HSV-1 infection   ? Hyperglycemia   ? Hyperlipidemia   ? Hypokalemia   ? Hypothyroidism   ? Liver cyst   ? Malignant neoplasm of right female breast, unspecified estrogen receptor status, unspecified site of breast ([place])   ? Memory loss   ? Osteopenia   ? Panic disorder   ? Schizophrenia ([place])   ? Ventral hernia   ? ? ?Past Surgical History:  ?Procedure Laterality Date  ? ABDOMINAL HYSTERECTOMY    ? BREAST BIOPSY Right 01/19/2021  ? [place] Bx, ribbon, path pending  ? BREAST BIOPSY Right 01/19/2021  ? [place] Bx, Venus, path pending  ? BREAST BIOPSY Right 01/19/2021  ? [place] Bx, heart, path pending  ? BREAST BIOPSY Right 01/19/2021  ? [place] BX Axilla, Coil, path pending  ? CESAREAN SECTION    ? CHOLECYSTECTOMY OPEN  11/17/2013  ? with repair of bile duct-Dr. Marina Gravel  ? MASTECTOMY MODIFIED RADICAL Right 03/02/2021  ? Procedure: MASTECTOMY MODIFIED RADICAL;  Surgeon: Benjamine Sprague, DO;  Location: [facility] ORS;  Service:  General;  Laterality: Right;  ? OPEN REDUCTION INTERNAL FIXATION (ORIF) DISTAL RADIAL FRACTURE Right 12/20/2014  ? Procedure: OPEN REDUCTION INTERNAL FIXATION (ORIF) RIGHT DISTAL RADIAL FRACTURE;  Surgeon: Renette Butters, MD;  Location: [facility];  Service: Orthopedics;  Laterality: Right;  ? STOMACH SURGERY    ? ? ?There were no vitals filed for this visit. ? ? Subjective Assessment - 05/30/21 1437   ? ? Subjective  I kept my arm elevated and I was hoping that today will be the last day for the bandages.  I did get a nice bra target today.  The place that we will do my compression sleeve Friday can they do my bras 2 and prosthesis   ? 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     PLAN:       1. 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  She underwent full mastectomy on December 30, 2021.  Despite recommendation of adjuvant Adriamycin and Cytoxan followed by Taxol, patient continues to be adamant that she will not undergo chemotherapy.  She has highly hesitant to pursue adjuvant XRT as well.  Given her ER/PR status of her tumor, it was recommended patient also take letrozole which she is considering.  No intervention is needed at this time.  Patient will follow-up in 3 weeks for further evaluation and additional treatment planning.   2. Postoperative pain: Patient was given a prescription for hydrocodone today.  3. Lymphedema: Patient was given a referral to lymphedema clinic as well has an appointment with occupational therapy.   ? Patient Stated Goals To get my right arm smaller and prevent infection   ? Currently in Pain? No/denies   ? ?  ?  ? ?  ? ? ? ? ? ? LYMPHEDEMA/ONCOLOGY QUESTIONNAIRE - 05/30/21 0001   ? ?   ? Right Upper Extremity Lymphedema  ? 10 cm Proximal to Olecranon Process 28 cm   ? Olecranon Process 25.8 cm   ? 15 cm Proximal to Ulnar Styloid Process 24 cm   ? 10 cm Proximal to Ulnar Styloid Process 20 cm   ? Just Proximal to Ulnar Styloid Process 15 cm   ? Across Hand at [employer] 17.4 cm   ? ?  ?  ? ?  ? ? ? ?  Patient arrived with with son and bandages on right upper extremity-patient reports no itchiness or redness this date. ?Measurements taken of circumference see flowsheet.  Forearm decreased 1 cm. ?Visually upper extremity looked much better ?  ?Patient and son was educated on lymphatic bandaging for right upper extremity.  Son did bring some cortisone ointment was applied over volar elbow together with Eucerin lotion.  Son can reapply them in the next 48 hours if needed.  ?Applied Isotoner glove ?New soft stockinette applied to right upper extremity ?Artiflex 10 cm  applied with 3 cycles over wrist and through thumb and up to elbow ?Artiflex 15 cm applied at wrist 1 time through the thumb and then up overlapping 50% to upper arm ?With  Topper of Rosidal foam at the proximal upper arm. ?6 cm short stretch bandage 3 cycles around the wrist through thumb alternating and up to forearm overlapping 50% ?New 8 cm at wrist 1 time through the thumb overlapping 50% up to proximal to elbow ?And then 10 cm overlapping 50% from mid forearm to proximal upper arm  ?  ?Patient and son educated on precautions and when to remove or rewrap bandages. ?Answered again and educated patient on anatomy of lymphatics.  As well as lymphedema. ? ?Reviewed several times and given written steps for home program for 3 times a day 10 reps for active digit flexion and extension, wrist extension and flexion, supination pronation, with elbow flexion extension. ?In shoulder flexion and D1-D2 patterns if needed in supine. ?Return in 48 hours to reassess circumference and plan for patient to refer to get measured for  over-the-counter daytime compression sleeve and glove as well as a pump.Reassess  Thoracic this date.  Did request a pump with thoracic piece. ? ? ? ? ? ? ? ? ? ? ? OT Education - 05/30/21 1437   ? ? Education Details Patient and son was educated on lymphedema treatment signs and symptoms as well as bandaging and precautions; and compression garments   ? Person(s) Educated Patient;Child(ren)   ? Methods Explanation;Demonstration;Verbal cues;Handout;Tactile cues   ? Comprehension Verbal cues required;Tactile cues  required;Returned demonstration;Verbalized understanding   ? ?  ?  ? ?  ? ? ? ? ? ? OT Long Term Goals - 05/24/21 1306   ? ?  ? OT LONG TERM GOAL #1  ? Title Patient to be independent in home program to tolerate and wearing of compression bandaging to decrease circumference in the right upper extremity to get measured for compression garments   ? Baseline Patient had never done bandaging before and showing right upper extremity and thoracic lymphedema.  Upper arm increased by 3 cm and elbow and forearm 3 to 4 cm and wrist 2.5 cm   ? Time 2   ? Period Weeks   ? Status New   ? Target Date 06/07/21   ?  ? OT LONG TERM GOAL #2  ? Title Patient to be independent and wearing of compression garment to decrease and maintain right upper extremity circumference.   ? Baseline Patient increased by 3 to 4 cm in right upper extremity compared to left.  Has no pump or compression done in the past.  No knowledge   ? Time 6   ? Period Weeks   ? Status New   ? Target Date 07/05/21   ?  ? OT LONG TERM GOAL #3  ? Title Patient to be independent in use of pneumatic compression pump to supplement daytime compression to maintain circumference and decrease future infection in right upper extremity   ? Baseline Assess if patient can be fitted with a pneumatic pump has no knowledge would not be able to tolerate a nighttime garment because of mental health would recommend the pump to use morning and evening   ? Time 6   ? Period Weeks    ? Status New   ? Target Date 07/05/21   ? ?  ?  ? ?  ? ? ? ? ? ? ? ? Plan - 05/30/21 1438   ? ? Clinical Impression Statement Patient  referred by Dr. Grayland Ormond for right upper extremity lymphedema.  Patient is about

## 2021-05-31 ENCOUNTER — Encounter: Payer: Self-pay | Admitting: Oncology

## 2021-06-01 ENCOUNTER — Ambulatory Visit: Payer: Medicare HMO | Admitting: Occupational Therapy

## 2021-06-01 DIAGNOSIS — L905 Scar conditions and fibrosis of skin: Secondary | ICD-10-CM

## 2021-06-01 DIAGNOSIS — I972 Postmastectomy lymphedema syndrome: Secondary | ICD-10-CM | POA: Diagnosis not present

## 2021-06-01 NOTE — Therapy (Signed)
[place] [facility] ?[address] ?[place], [place], [address] ?Phone: [phone]   Fax:  [phone] ? ?Occupational Therapy Treatment ? ?Patient Details  ?Name: CHRISTENE POUNDS ?MRN: [id number] ?Date of Birth: 05-20-1950 ?Referring Provider (OT): Dr Grayland Ormond ? ? ?Encounter Date: 06/01/2021 ? ? OT End of Session - 06/01/21 1133   ? ? Visit Number 4   ? Number of Visits 12   ? Date for OT Re-Evaluation 07/05/21   ? OT Start Time 1002   ? OT Stop Time 1047   ? OT Time Calculation (min) 45 min   ? Activity Tolerance Patient tolerated treatment well   ? Behavior During Therapy [facility] for tasks assessed/performed   ? ?  ?  ? ?  ? ? ?Past Medical History:  ?Diagnosis Date  ? Acute cholecystitis 2016  ? Anemia   ? Anemia 2016  ? Anxiety   ? panic attacks  ? Anxiety and depression   ? Bipolar 1 disorder ([place])   ? Bowel obstruction ([place]) 2017  ? Common bile duct dilatation 2015  ? Dyspnea   ? Edema   ? Fibromyalgia   ? GI bleed 2017  ? History of kidney stones 01/21/2009  ? HSV-1 infection   ? Hyperglycemia   ? Hyperlipidemia   ? Hypokalemia   ? Hypothyroidism   ? Liver cyst   ? Malignant neoplasm of right female breast, unspecified estrogen receptor status, unspecified site of breast ([place])   ? Memory loss   ? Osteopenia   ? Panic disorder   ? Schizophrenia ([place])   ? Ventral hernia   ? ? ?Past Surgical History:  ?Procedure Laterality Date  ? ABDOMINAL HYSTERECTOMY    ? BREAST BIOPSY Right 01/19/2021  ? [place] Bx, ribbon, path pending  ? BREAST BIOPSY Right 01/19/2021  ? [place] Bx, Venus, path pending  ? BREAST BIOPSY Right 01/19/2021  ? [place] Bx, heart, path pending  ? BREAST BIOPSY Right 01/19/2021  ? [place] BX Axilla, Coil, path pending  ? CESAREAN SECTION    ? CHOLECYSTECTOMY OPEN  11/17/2013  ? with repair of bile duct-Dr. Marina Gravel  ? MASTECTOMY MODIFIED RADICAL Right 03/02/2021  ? Procedure: MASTECTOMY MODIFIED RADICAL;  Surgeon: Benjamine Sprague, DO;  Location: [facility] ORS;  Service:  General;  Laterality: Right;  ? OPEN REDUCTION INTERNAL FIXATION (ORIF) DISTAL RADIAL FRACTURE Right 12/20/2014  ? Procedure: OPEN REDUCTION INTERNAL FIXATION (ORIF) RIGHT DISTAL RADIAL FRACTURE;  Surgeon: Renette Butters, MD;  Location: [facility];  Service: Orthopedics;  Laterality: Right;  ? STOMACH SURGERY    ? ? ?There were no vitals filed for this visit. ? ? Subjective Assessment - 06/01/21 1041   ? ? Subjective  Did okay since last time.  I did not do my exercises but I did keep my arm up and they are do not know if that helped.   ? 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     PLAN:       1. 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  She underwent full mastectomy on December 30, 2021.  Despite recommendation of adjuvant Adriamycin and Cytoxan followed by Taxol, patient continues to be adamant that she will not  undergo chemotherapy.  She has highly hesitant to pursue adjuvant XRT as well.  Given her ER/PR status of her tumor, it was recommended patient also take letrozole which she is considering.  No intervention is needed at this time.  Patient will follow-up in 3 weeks for further evaluation and additional treatment planning.   2. Postoperative pain: Patient was given a prescription for hydrocodone today.  3. Lymphedema: Patient was given a referral to lymphedema clinic as well has an appointment with occupational therapy.   ? Patient Stated Goals To get my right arm smaller and prevent infection   ? Currently in Pain? No/denies   ? ?  ?  ? ?  ? ? ? ? ? ? LYMPHEDEMA/ONCOLOGY QUESTIONNAIRE - 06/01/21 0001   ? ?  ? Right Upper Extremity Lymphedema  ? 15 cm Proximal to Olecranon Process 29 cm   ? 10 cm Proximal  to Olecranon Process 28.4 cm   ? Olecranon Process 25.3 cm   ? 15 cm Proximal to Ulnar Styloid Process 23.4 cm   ? 10 cm Proximal to Ulnar Styloid Process 19 cm   ? Just Proximal to Ulnar Styloid Process 15 cm   ? Across Hand at [employer] 17.5 cm   ? ?  ?  ? ?  ? ? ? ? ?Patient arrived with with son and bandages on right upper extremity-patient reports no itchiness or redness this date. ?Measurements taken of circumference see flowsheet. Some great decongesting in forearm to wrist  ?  ?Pt was send to [facility] to get measured and fitted for daytime compression sleeve and glove. ?Per rep at [facility] pump not authorized yet has everything ready to go early next week await authorization. ?Patient and son arrived back and fitted with Isotoner glove with a Tubigrip D and a Tubigrip E for nighttime. ?Patient educated on donning and wearing of Tubigrip for nighttime D Tubigrip hand to elbow.  Tubigrip E from mid forearm to upper arm with a light Velcro strap to prevent rolling. ? ?Assess patient compression day sleeve.  Fitting well patient educated and demo donning and doffing of compression sleeve and glove. ?  ?Patient to use Eucerin lotion and cortisone ointment as needed at nighttime not during the day under daytime compression. ?Patient will continue with home program for 2 weeks and will follow-up to reassess if maintaining lymphedema in right upper extremity with home program and garments/pump.  ?  ?  ?  ?  ? ? ? ? ? ? ? ? ? ? ? ? ? OT Education - 06/01/21 1133   ? ? Education Details Patient and son was educated on lymphedema treatment signs and symptoms as well as bandaging and precautions; and compression garments   ? Person(s) Educated Patient;Child(ren)   ? Methods Explanation;Demonstration;Verbal cues;Handout;Tactile cues   ? Comprehension Verbal cues required;Tactile cues required;Returned demonstration;Verbalized understanding   ? ?  ?  ? ?  ? ? ? ? ? ? OT Long Term Goals - 05/24/21 1306   ? ?  ? OT  LONG TERM GOAL #1  ? Title Patient to be independent in home program to tolerate and wearing of compression bandaging to decrease circumference in the right upper extremity to get measured for compression garments   ? Baseline Patient had never done bandaging before and showing right upper extremity and thoracic lymphedema.  Upper arm increased by 3 cm and elbow and forearm 3 to 4 cm and wrist 2.5 cm   ? Time  2   ? Period Weeks   ? Status New   ? Target Date 06/07/21   ?  ? OT LONG TERM GOAL #2  ? Title Patient to be independent and wearing of compression garment to decrease and maintain right upper extremity circumference.   ? Baseline Patient increased by 3 to 4 cm in right upper extremity compared to left.  Has no pump or compression done in the past.  No knowledge   ? Time 6   ? Period Weeks   ? Status New   ? Target Date 07/05/21   ?  ? OT LONG TERM GOAL #3  ? Title Patient to be independent in use of pneumatic compression pump to supplement daytime compression to maintain circumference and decrease future infection in right upper extremity   ? Baseline Assess if patient can be fitted with a pneumatic pump has no knowledge would not be able to tolerate a nighttime garment because of mental health would recommend the pump to use morning and evening   ? Time 6   ? Period Weeks   ? Status New   ? Target Date 07/05/21   ? ?  ?  ? ?  ? ? ? ? ? ? ? ? Plan - 06/01/21 1133   ? ? Clinical Impression Statement Patient  referred by Dr. Grayland Ormond for right upper extremity lymphedema.  Patient is about 10 weeks postop right mastectomy with 11-13 lymph nodes removed.  Patient report swelling started about 3 weeks ago in the right breast and arm.  Upon assessment at eval it appears swelling date started little bit earlier coming and going but staying more in the arm and chest the last week to 2.  Appear patient has stage II lymphedema in the right upper extremity and thoracic requiring CDT by lymphedema therapist.  Patient's  right upper extremity was at eval increase 2-4 cm. Pt was bandage for about week and decrease  now to 1.3 -2.5 cm compare to L. Patient is right-hand dominant and reports doing laundry and some cleaning aroun

## 2021-06-08 ENCOUNTER — Encounter: Payer: Self-pay | Admitting: Licensed Clinical Social Worker

## 2021-06-08 NOTE — Progress Notes (Signed)
[facility] CSW Progress Note  Clinical Education officer, museum contacted patient by phone returned call from patient. Left voicemail with contact information.    Adelene Amas, LCSW

## 2021-06-11 ENCOUNTER — Telehealth: Payer: Self-pay | Admitting: *Deleted

## 2021-06-11 ENCOUNTER — Encounter: Payer: Medicare HMO | Attending: Family Medicine

## 2021-06-11 DIAGNOSIS — C50911 Malignant neoplasm of unspecified site of right female breast: Secondary | ICD-10-CM

## 2021-06-11 DIAGNOSIS — I890 Lymphedema, not elsewhere classified: Secondary | ICD-10-CM

## 2021-06-11 NOTE — Progress Notes (Signed)
Appointment scheduled today for phone call interview for CARE program. 3 way call completed with patient and son per request.  Not all questions were answered as patient was unsure if she wanted to start the program. Very hesitant that we are located in the hospital. Explained the program to patient thoroughly and answered all questions. Patient was unknown in coming to appointment scheduled tomorrow, 5/23.   Patient called back and said she'd like to come tomorrow. Did confirm she has transportation for her appointment tomorrow.

## 2021-06-11 NOTE — Telephone Encounter (Signed)
Patient's son [name] triage and left a VM message for [place]. He called to check on transportation arrangements for his mother to start the Care Program. Please call him at [phone].

## 2021-06-12 ENCOUNTER — Inpatient Hospital Stay: Payer: Medicare HMO

## 2021-06-12 ENCOUNTER — Encounter: Payer: Self-pay | Admitting: Oncology

## 2021-06-12 VITALS — Ht 61.75 in | Wt 113.9 lb

## 2021-06-12 DIAGNOSIS — I890 Lymphedema, not elsewhere classified: Secondary | ICD-10-CM

## 2021-06-12 DIAGNOSIS — C50911 Malignant neoplasm of unspecified site of right female breast: Secondary | ICD-10-CM

## 2021-06-12 NOTE — Progress Notes (Signed)
CARE Daily Session Note  Patient Details  Name: Elizabeth [name] MRN: [id number] Date of Birth: 1950-12-02 Referring Provider:   April Manson [facility] & Exercise from 06/12/2021 in [facility]  Referring Provider Delight Hoh MD       Encounter Date: 06/12/2021  Check In:  Session Check In - 06/12/21 1337       Check-In   Supervising physician immediately available to respond to emergencies See telemetry face sheet for immediately available ER MD    Location [facility]    Staff Present Coralie Keens, MS, ASCM CEP, Exercise Physiologist;Kelly Rosalia Hammers, MPA, RN;Jessica Luan Pulling, MA, RCEP, CCRP, CCET    Virtual Visit No    Medication changes reported     No    Fall or balance concerns reported    No    Warm-up and Cool-down Not performed (comment)   6MWT and Orientation   Resistance Training Performed No    VAD Patient? No    PAD/SET Patient? No              6 Minute Walk     Row Name 06/12/21 1513         6 Minute Walk   Phase Initial     Distance 640 feet     Walk Time 5.5 minutes     # of Rest Breaks 1  Stopped at 5:30     MPH 1.32     METS 1.89     RPE 11     Perceived Dyspnea  1     VO2 Peak 6.64     Symptoms Yes (comment)     Comments Left leg fatigue     Resting HR 83 bpm     Resting BP 106/68     Resting Oxygen Saturation  98 %     Exercise Oxygen Saturation  during 6 min walk 97 %     Max Ex. HR 107 bpm     Max Ex. BP 114/70     2 Minute Post BP 104/66                Exercise Prescription Changes - 06/12/21 1500       Response to Exercise   Blood Pressure (Admit) 106/68    Blood Pressure (Exercise) 114/70    Blood Pressure (Exit) 104/66    Heart Rate (Admit) 83 bpm    Heart Rate (Exercise) 107 bpm    Heart Rate (Exit) 89 bpm    Oxygen Saturation (Admit) 98 %    Oxygen Saturation (Exercise) 97 %    Oxygen Saturation (Exit) 98 %    Rating of Perceived Exertion  (Exercise) 11    Perceived Dyspnea (Exercise) 1    Symptoms Left leg fatigue    Comments walk test results             Social History   Tobacco Use  Smoking Status Former   Packs/day: 0.00   Years: 0.00   Pack years: 0.00   Types: Cigarettes   Quit date: 11/03/1973   Years since quitting: 47.6   Passive exposure: Past  Smokeless Tobacco Never  Tobacco Comments   Smoked briefly as a teen- lives in secondhand smoke.     Goals Met:  Proper associated with RPD/PD & O2 Sat Exercise tolerated well Personal goals reviewed No report of concerns or symptoms today  Goals Unmet:  Not Applicable  Comments: First full day of orientation. All  starting workloads were established based on the results of the 6 minute walk test done at initial orientation visit.  The plan for exercise progression was also introduced and progression will be customized based on patient's performance and goals.    Dr. Emily Filbert is Medical Director for [facility].  Dr. Ottie Glazier is Medical Director for [facility] Pulmonary Rehabilitation.

## 2021-06-13 ENCOUNTER — Telehealth: Payer: Self-pay | Admitting: Licensed Clinical Social Worker

## 2021-06-13 ENCOUNTER — Encounter: Payer: Self-pay | Admitting: Licensed Clinical Social Worker

## 2021-06-13 NOTE — Progress Notes (Signed)
[place] Work  Clinical Social Work was referred by Art therapist for assessment of psychosocial needs.  Clinical Social Worker contacted caregiver by phone  to offer support and assess for needs.  CSW received voicemail from Mr. Ellerie Arenz [phone], in reference to missed appointment.  CSW called Mr. Pasquini to update him that I called the patient last week and left voicemail. CSW left voicemail for Mr. Mast with contact information and request for return call.    Adelene Amas, [place] Worker [facility]

## 2021-06-13 NOTE — Telephone Encounter (Signed)
Patient's son called and voicemail left.

## 2021-06-14 ENCOUNTER — Inpatient Hospital Stay: Payer: Medicare HMO

## 2021-06-19 ENCOUNTER — Inpatient Hospital Stay: Payer: Medicare HMO

## 2021-06-19 ENCOUNTER — Ambulatory Visit: Payer: Medicare HMO | Admitting: Occupational Therapy

## 2021-06-19 DIAGNOSIS — I972 Postmastectomy lymphedema syndrome: Secondary | ICD-10-CM

## 2021-06-19 DIAGNOSIS — L905 Scar conditions and fibrosis of skin: Secondary | ICD-10-CM

## 2021-06-19 NOTE — Therapy (Signed)
[facility] [address], [place], [address] Phone: [phone]   Fax:  [phone]  Occupational Therapy Treatment  Patient Details  Name: Elizabeth [name] MRN: [id number] [name] of Birth: 1950-12-10 Referring Provider (OT): Dr Grayland Ormond   Encounter [name]: 06/19/2021   OT End of Session - 06/19/21 1754     Visit Number 5    Number of Visits 12    [name] for OT Re-Evaluation 07/05/21    OT Start Time 1354    OT Stop Time 1424    OT Time Calculation (min) 30 min    Activity Tolerance Patient tolerated treatment well    Behavior During Therapy [facility] for tasks assessed/performed             Past Medical History:  Diagnosis [name]   Acute cholecystitis 2016   Anemia    Anemia 2016   Anxiety    panic attacks   Anxiety and depression    Bipolar 1 disorder ([facility])    Bowel obstruction ([place]) 2017   Common bile duct dilatation 2015   Dyspnea    Edema    Fibromyalgia    GI bleed 2017   History of kidney stones 01/21/2009   HSV-1 infection    Hyperglycemia    Hyperlipidemia    Hypokalemia    Hypothyroidism    Liver cyst    Malignant neoplasm of right female breast, unspecified estrogen receptor status, unspecified site of breast ([place])    Memory loss    Osteopenia    Panic disorder    Schizophrenia ([place])    Ventral hernia     Past Surgical History:  Procedure Laterality [name]   ABDOMINAL HYSTERECTOMY     BREAST BIOPSY Right 01/19/2021   [place] Bx, ribbon, path pending   BREAST BIOPSY Right 01/19/2021   [place] Bx, Venus, path pending   BREAST BIOPSY Right 01/19/2021   [place] Bx, heart, path pending   BREAST BIOPSY Right 01/19/2021   [place] BX Axilla, Coil, path pending   CESAREAN SECTION     CHOLECYSTECTOMY OPEN  11/17/2013   with repair of bile duct-Dr. Marina Gravel   MASTECTOMY MODIFIED RADICAL Right 03/02/2021   Procedure: MASTECTOMY MODIFIED RADICAL;  Surgeon: Benjamine Sprague, DO;  Location: [facility] ORS;  Service:  General;  Laterality: Right;   OPEN REDUCTION INTERNAL FIXATION (ORIF) DISTAL RADIAL FRACTURE Right 12/20/2014   Procedure: OPEN REDUCTION INTERNAL FIXATION (ORIF) RIGHT DISTAL RADIAL FRACTURE;  Surgeon: Renette Butters, MD;  Location: [facility];  Service: Orthopedics;  Laterality: Right;   STOMACH SURGERY      There were no vitals filed for this visit.   Subjective Assessment - 06/19/21 1753     Subjective  I am using the pump in the morning and the evening I think is set for 35 minutes.  Is next to my couch.  The sleeve to wear during the day.  I am just worried about how much of this cost.    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     PLAN:       1. 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  She underwent full mastectomy on December 30, 2021.  Despite recommendation of adjuvant  Adriamycin and Cytoxan followed by Taxol, patient continues to be adamant that she will not undergo chemotherapy.  She has highly hesitant to pursue adjuvant XRT as well.  Given her ER/PR status of her tumor, it was recommended patient also take letrozole which she is considering.  No intervention is needed at this time.  Patient will follow-up in 3 weeks for further evaluation and additional treatment planning.   2. Postoperative pain: Patient was given a prescription for hydrocodone today.  3. Lymphedema: Patient was given a referral to lymphedema clinic as well has an appointment with occupational therapy.    Patient Stated Goals To get my right arm smaller and prevent infection    Currently in Pain? No/denies                 LYMPHEDEMA/ONCOLOGY QUESTIONNAIRE - 06/19/21 0001       Right Upper Extremity  Lymphedema   15 cm Proximal to Olecranon Process 29 cm    10 cm Proximal to Olecranon Process 29 cm    Olecranon Process 24.5 cm    15 cm Proximal to Ulnar Styloid Process 23.6 cm    10 cm Proximal to Ulnar Styloid Process 20.5 cm    Just Proximal to Ulnar Styloid Process 15.3 cm    Across Hand at [employer] 18 cm               Patient arrived with her over-the-counter compression sleeve and glove on the right upper extremity.  Patient Elizabeth [name] came with transportation.  Patient compression sleeve is only halfway up to the elbow.   Removed and measurements taken see flowsheet.  Because of compression sleeve not all the way up [place] and sleeve had wrinkles in it causing a tourniquet around the elbow.   Patient measurements from forearm distally was increased somewhat compared to 2 weeks ago.   Reviewed with patient wearing correctly compression sleeve.  As well as donning and doffing.   Practice about 3 times in the clinic reviewed with patient several times patient able to demonstrate correctly donning and wearing compression sleeve and glove.   Compression sleeve fitting very well.   Patient do report she is using her pump in the morning in the evening for about 35 minutes is next to her couch.  And set up for about 35 minutes per patient.    Patient to use Eucerin lotion and cortisone ointment as needed at nighttime not during the day under daytime compression. Patient will continue with home program for 3-4 weeks and will follow-up to reassess if maintaining lymphedema in right upper extremity with home program and garments/pump.                 OT Education - 06/19/21 1754     Education Details Reviewed with patient again sequence and home program as well as donning and wearing compression sleeve correctly    Person(s) Educated Patient    Methods Explanation;Demonstration;Verbal cues;Handout;Tactile cues    Comprehension Verbal cues required;Tactile  cues required;Returned demonstration;Verbalized understanding                 OT Long Term Goals - 05/24/21 1306       OT LONG TERM GOAL #1   Title Patient to be independent in home program to tolerate and wearing of compression bandaging to decrease circumference in the right upper extremity to get measured for compression garments    Baseline Patient had never done bandaging before and showing  right upper extremity and thoracic lymphedema.  Upper arm increased by 3 cm and elbow and forearm 3 to 4 cm and wrist 2.5 cm    Time 2    Period Weeks    Status New    Target [name] 06/07/21      OT LONG TERM GOAL #2   Title Patient to be independent and wearing of compression garment to decrease and maintain right upper extremity circumference.    Baseline Patient increased by 3 to 4 cm in right upper extremity compared to left.  Has no pump or compression done in the past.  No knowledge    Time 6    Period Weeks    Status New    Target [name] 07/05/21      OT LONG TERM GOAL #3   Title Patient to be independent in use of pneumatic compression pump to supplement daytime compression to maintain circumference and decrease future infection in right upper extremity    Baseline Assess if patient can be fitted with a pneumatic pump has no knowledge would not be able to tolerate a nighttime garment because of mental health would recommend the pump to use morning and evening    Time 6    Period Weeks    Status New    Target [name] 07/05/21                   Plan - 06/19/21 1755     Clinical Impression Statement Patient  referred by Dr. Grayland Ormond for right upper extremity lymphedema.  Patient is about 10 weeks postop right mastectomy with 11-13 lymph nodes removed.  Patient report swelling started about 5 weeks ago in the right breast and arm.  Upon assessment at eval it appears swelling  started little bit earlier coming and going but staying more in the arm than chest..  Patient's right  upper extremity was at eval increase 2-4 cm. Pt was bandage for about week and decrease  to 1.3 -2.5 cm compare to L. Patient is right-hand dominant and reports doing laundry and some cleaning around the house but not cooking.  She do walk some per patient.  Patient has transportation issues.  Son was in town the week during her bandaging.  I could bring her to her appointments.  Patient was measured for over-the-counter daytime compression sleeve and glove that she has been wearing for 2 weeks.  She also has a pump that she uses in the morning in the evening for about 30 to 40 minutes.  Patient to arrive with compression sleeve up to elbow with some wrinkles in it causing a tourniquet at the elbow.  Patient's forearm measurements was slightly up compared to 2 weeks ago.  Reviewed with patient again donning and wearing of compression sleeve correctly was able to demonstrate in clinic 3 times.  Patient to follow-up with me again in 3 weeks for reassessment..  Patient can benefit from skilled OT services to decongest right upper extremity and thoracic unable to keep lymphedema under control with appropriate compression garments and a home program- to prevent future cellulitis issues.    OT Occupational Profile and History Problem Focused Assessment - Including review of records relating to presenting problem    Occupational performance deficits (Please refer to evaluation for details): ADL's;IADL's;Leisure;Play    Body Structure / Function / Physical Skills ADL;Decreased knowledge of precautions;Flexibility;Scar mobility;IADL;Edema    Rehab Potential Good    Clinical Decision Making Limited treatment options, no task modification necessary  Comorbidities Affecting Occupational Performance: None    Modification or Assistance to Complete Evaluation  No modification of tasks or assist necessary to complete eval    OT Frequency Biweekly    OT Duration 6 weeks    OT Treatment/Interventions Self-care/ADL  training;Manual lymph drainage;DME and/or AE instruction;Compression bandaging;Scar mobilization;Manual Therapy;Patient/family education    Consulted and Agree with Plan of Care Patient             Patient will benefit from skilled therapeutic intervention in order to improve the following deficits and impairments:   Body Structure / Function / Physical Skills: ADL, Decreased knowledge of precautions, Flexibility, Scar mobility, IADL, Edema       Visit Diagnosis: Postmastectomy lymphedema syndrome  Scar condition and fibrosis of skin    Problem List Patient Active Problem List   Diagnosis [name] Noted   Major depressive disorder, recurrent severe without psychotic features ([place]) 04/21/2021   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[place]) 01/28/2021   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   Medical non-compliance 08/17/2020   Hepatic abscess 01/10/2016   History of diabetes mellitus, type II 11/04/2014   History of essential hypertension 11/04/2014   Intra-abdominal and pelvic swelling, mass and lump, unspecified site 11/03/2013    Elizabeth [name], Elizabeth [name],Elizabeth [name] 06/19/2021, 6:07 PM  [facility] [address], [place], [address] Phone: [phone]   Fax:  [phone]  Name: Elizabeth [name] MRN: [id number] [name] of Birth: July 31, 1950

## 2021-06-21 ENCOUNTER — Encounter: Payer: Medicare HMO | Attending: Family Medicine

## 2021-06-21 DIAGNOSIS — I890 Lymphedema, not elsewhere classified: Secondary | ICD-10-CM | POA: Insufficient documentation

## 2021-06-21 DIAGNOSIS — C50911 Malignant neoplasm of unspecified site of right female breast: Secondary | ICD-10-CM | POA: Insufficient documentation

## 2021-06-21 NOTE — Progress Notes (Signed)
Daily Session Note  Patient Details  Name: Elizabeth [name] MRN: [id number] Date of Birth: February 13, 1950 Referring Provider:   April Manson Cancer Associated Rehabilitation & Exercise from 06/12/2021 in [facility] Cardiac and Pulmonary Rehab  Referring Provider Delight Hoh MD       Encounter Date: 06/21/2021  Check In:  Session Check In - 06/21/21 1330       Check-In   Supervising physician immediately available to respond to emergencies See telemetry face sheet for immediately available ER MD    Location [facility]-Cardiac & Pulmonary Rehab    Staff Present Coralie Keens, MS, ASCM CEP, Exercise Physiologist;Kelly Rosalia Hammers, MPA, RN;Jessica Luan Pulling, MA, RCEP, CCRP, CCET    Virtual Visit No    Medication changes reported     No    Fall or balance concerns reported    No    Warm-up and Cool-down Performed on first and last piece of equipment    Resistance Training Performed Yes    VAD Patient? No    PAD/SET Patient? No                Social History   Tobacco Use  Smoking Status Former   Packs/day: 0.00   Years: 0.00   Pack years: 0.00   Types: Cigarettes   Quit date: 11/03/1973   Years since quitting: 47.6   Passive exposure: Past  Smokeless Tobacco Never  Tobacco Comments   Smoked briefly as a teen- lives in secondhand smoke.     Goals Met:  Proper associated with RPD/PD & O2 Sat Independence with exercise equipment Exercise tolerated well No report of concerns or symptoms today Strength training completed today  Goals Unmet:  Not Applicable  Comments: First full day of exercise!  Patient was oriented to gym and equipment including functions, settings, policies, and procedures.  Patient's individual exercise prescription and treatment plan were reviewed.  All starting workloads were established based on the results of the 6 minute walk test done at initial orientation visit.  The plan for exercise progression was also introduced and progression will be  customized based on patient's performance and goals.    Dr. Emily Filbert is Medical Director for [facility].  Dr. Ottie Glazier is Medical Director for [facility] Pulmonary Rehabilitation.

## 2021-06-28 DIAGNOSIS — C50911 Malignant neoplasm of unspecified site of right female breast: Secondary | ICD-10-CM

## 2021-06-28 DIAGNOSIS — I890 Lymphedema, not elsewhere classified: Secondary | ICD-10-CM
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
## 2021-06-28 NOTE — Progress Notes (Signed)
Daily Session Note  Patient Details  Name: Elizabeth [name] MRN: [id number] Date of Birth: [birth year] Referring Provider:   April Manson Cancer Associated Rehabilitation & Exercise from 06/12/2021 in [facility] Cardiac and Pulmonary Rehab  Referring Provider Delight Hoh MD       Encounter Date: 06/28/2021  Check In:  Session Check In - 06/28/21 1204       Check-In   Supervising physician immediately available to respond to emergencies See telemetry face sheet for immediately available ER MD    Location [facility]-Cardiac & Pulmonary Rehab    Staff Present Coralie Keens, MS, ASCM CEP, Exercise Physiologist;Jessica Luan Pulling, MA, RCEP, CCRP, Kathaleen Maser, MPA, RN    Virtual Visit No    Medication changes reported     No    Fall or balance concerns reported    No    Warm-up and Cool-down Performed on first and last piece of equipment    Resistance Training Performed Yes    VAD Patient? No    PAD/SET Patient? No      Pain Assessment   Currently in Pain? No/denies                Social History   Tobacco Use  Smoking Status Former   Packs/day: 0.00   Years: 0.00   Total pack years: 0.00   Types: Cigarettes   Quit date: 11/03/1973   Years since quitting: 47.6   Passive exposure: Past  Smokeless Tobacco Never  Tobacco Comments   Smoked briefly as a teen- lives in secondhand smoke.     Goals Met:  Independence with exercise equipment Exercise tolerated well No report of concerns or symptoms today Strength training completed today  Goals Unmet:  Not Applicable  Comments: Pt able to follow exercise prescription today without complaint.  Will continue to monitor for progression.  Patient stated comments about "dying because my weight is too low" staff asked if patient has any suicide ideations, plan, or wants to harm herself which she declined at this time. Just "really want to talk to someone about my weight." Patient was reminded she has an appointment with  the dietician next Tuesday, 6/13. Noted to care team addressed.     Dr. Emily Filbert is Medical Director for [facility].  Dr. Ottie Glazier is Medical Director for [facility] Pulmonary Rehabilitation.

## 2021-06-29 ENCOUNTER — Inpatient Hospital Stay: Payer: Medicare HMO | Attending: Oncology | Admitting: Licensed Clinical Social Worker

## 2021-06-29 ENCOUNTER — Encounter: Payer: Self-pay | Admitting: Licensed Clinical Social Worker

## 2021-06-29 DIAGNOSIS — C50911 Malignant neoplasm of unspecified site of right female breast: Secondary | ICD-10-CM

## 2021-06-29 NOTE — Progress Notes (Signed)
[facility] CSW Progress Note  Clinical Education officer, museum contacted caregiver by phone to for follow-up.  Mr. Sleep was inquiring as to whether the patient has followed-up on counseling appointments.  CSW stated I have called the patient twice and left voicemails and she has not return the phone cal.  Mr. White stated all the patient needs is motivation.  CSW stated that I would be happy to continue calling the patient and leaving her voicemails but that I did not want to upset the patient by insisting she attend counseling sessions.  I also updated Mr. 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  Mr. Gamm stated had looked in to [facility], which treats eating disorders, CSW stated I would research that as well and make a recommendation.  Mr. Harmsen stated he the patient has an appointment with the psychiatrist on 6/13, but his concern is the patient is refusing to take her medication regularly.  CSW reminded Mr.Mceachin that we will not be able to force the patient to take her medication or attend counseling but we will keep on trying to keep the lines of communication open with the patient.  Mr. Locust verbalized understanding.  CSW emailed Mr. Orban information on Intensive Outpatient and Partial Hospitalization Programs in [place], [place] and [place] to [email or web address].    Adelene Amas, LCSW

## 2021-07-03 ENCOUNTER — Encounter: Payer: Self-pay | Admitting: Oncology

## 2021-07-03 ENCOUNTER — Ambulatory Visit (INDEPENDENT_AMBULATORY_CARE_PROVIDER_SITE_OTHER): Payer: Medicare HMO | Admitting: Psychiatry

## 2021-07-03 ENCOUNTER — Encounter: Payer: Medicare HMO | Admitting: *Deleted

## 2021-07-03 ENCOUNTER — Inpatient Hospital Stay: Payer: Medicare HMO

## 2021-07-03 ENCOUNTER — Encounter: Payer: Self-pay | Admitting: Psychiatry

## 2021-07-03 VITALS — BP 103/63 | HR 85 | Temp 97.9°F | Wt 112.8 lb

## 2021-07-03 DIAGNOSIS — C50911 Malignant neoplasm of unspecified site of right female breast: Secondary | ICD-10-CM

## 2021-07-03 DIAGNOSIS — F3130 Bipolar disorder, current episode depressed, mild or moderate severity, unspecified: Secondary | ICD-10-CM

## 2021-07-03 MED ORDER — QUETIAPINE FUMARATE 25 MG PO TABS: 25.0000 mg | ORAL_TABLET | Freq: Every day | ORAL | 0 refills | Status: DC

## 2021-07-03 NOTE — Progress Notes (Signed)
Daily Session Note  Patient Details  Name: Elizabeth [name] MRN: [id number] Date of Birth: August 13, 1950 Referring Provider:   [name] Cancer Associated Rehabilitation & Exercise from 06/12/2021 in [facility] Cardiac and Pulmonary Rehab  Referring Provider Elizabeth Hoh MD       Encounter Date: 07/03/2021  Check In:  Session Check In - 07/03/21 1206       Check-In   Supervising physician immediately available to respond to emergencies See telemetry face sheet for immediately available ER MD    Location [facility]-Cardiac & Pulmonary Rehab    Staff Present Heath Lark, RN, BSN, CCRP;Nerea B[place]ave [place], MA, RCEP, CCRP, Marylynn Pearson, MS, ASCM CEP, Exercise Physiologist    Virtual Visit No    Medication changes reported     No    Fall or balance concerns reported    No    Warm-up and Cool-down Performed on first and last piece of equipment    Resistance Training Performed Yes    VAD Patient? No    PAD/SET Patient? No      Pain Assessment   Currently in Pain? No/denies                Social History   Tobacco Use  Smoking Status Former   Packs/day: 0.00   Years: 0.00   Total pack years: 0.00   Types: Cigarettes   Quit date: 11/03/1973   Years since quitting: 47.6   Passive exposure: Past  Smokeless Tobacco Never  Tobacco Comments   Smoked briefly as a teen- lives in secondhand smoke.     Goals Met:  Proper associated with RPD/PD & O2 Sat Exercise tolerated well No report of concerns or symptoms today Strength training completed today  Goals Unmet:  Not Applicable  Comments: Pt able to follow exercise prescription today without complaint.  Will continue to monitor for progression.  [name]'s weight was down 2 lb today from last week.  She did meet with [facility] today before class.  We will continue to monitor her weight.   Dr. Emily Filbert is Medical Director for [facility].  Dr. Ottie Glazier is Medical  Director for [facility] Pulmonary Rehabilitation.

## 2021-07-03 NOTE — Progress Notes (Signed)
Nutrition Assessment   Reason for Assessment:   Referral from LCSW and OT for concerned about weight loss   ASSESSMENT:  70 year old female with stage III right breast cancer. Patient has refused chemotherapy and radiation therapy.  She is currently on letrozole.  Past medical history of bipolar, anxiety, HLD, mastectomy.    Met with patient and son.  Son step out for part of visit.  Patient has started the CARE program.  Also being following by cancer center OT and LCSW.  Says that when she eats she has to go to the bathroom right after and she can't gain weight.  On further questioning have 1 normal stool per day or a stool every other day.  Says that she can't eat because of her teeth.  She wants them all pulled.  Son says that patient has not seen a dentist in awhile because she cancels the appointments.  Was getting regular cleaning without issues.  Son says that patient is upset about yellowing of teeth and wants them all pulled.  Patient states that she wants to gain weight.  She is wanting to eat foods with protein.  Says that she is concerned about going to the CARE program because she is going to loose weight.  Continues to say that she needs a complete physical.    Says that she eats toast and coffee for breakfast. Has Meals on Wheels for lunch.  Evening meal is TV dinners, sandwiches.    Son says that when patient eats she eats a good amount but her getting the motivation to eat is challenging.   Medications: fe sulfate, letrozole, zofran, synthroid, Vit B 12.    Labs: reviewed from 04/21/21   Anthropometrics:   Height: 61 inches Weight: 111 lb 9 oz today 122 lb 03/14/21 115 lb on 01/31/21 BMI: 20  9% weight loss in the last 4 months, concerning    INTERVENTION:  Discussed normal bowel movement frequency.   Encouraged patient to make an appointment to see dentist for teeth evaluation  Discussed what patient can do to reach goal of gaining weight or at least keep weight  stable.   Patient set goal of eating eggs and breakfast meat with toast at least 2 times per week.  Currently just eating toast. Adding protein foods at 3 meals per day.  Reviewed foods that contain protein and RD wrote these down for patient. Discussed alternatives with protein to regular milk as thinks she maybe lactose intolerant Planning to add 10-12 nacho chips and cheese dip at night time snack (7pm).   Discussed eating within 1 hour of waking up.  Patient being followed by psychiatry and LCSW.   Contact information given to patient     MONITORING, EVALUATION, GOAL: weight trends, intake   Next Visit: Tuesday, July 11    B. , RD, LDN Registered Dietitian [phone]      

## 2021-07-10 ENCOUNTER — Ambulatory Visit: Payer: Medicare HMO | Admitting: Occupational Therapy

## 2021-07-17 ENCOUNTER — Ambulatory Visit: Payer: Medicare HMO | Admitting: Occupational Therapy

## 2021-07-18 ENCOUNTER — Emergency Department: Payer: Medicare HMO

## 2021-07-18 ENCOUNTER — Other Ambulatory Visit: Payer: Self-pay

## 2021-07-18 ENCOUNTER — Encounter: Payer: Self-pay | Admitting: Licensed Clinical Social Worker

## 2021-07-18 ENCOUNTER — Emergency Department
Admission: EM | Admit: 2021-07-18 | Discharge: 2021-07-19 | Disposition: A | Discharge status: Other Acute Inpatient Hospital | Payer: Medicare HMO | Attending: Emergency Medicine | Admitting: Emergency Medicine

## 2021-07-18 DIAGNOSIS — F332 Major depressive disorder, recurrent severe without psychotic features: Secondary | ICD-10-CM | POA: Diagnosis not present

## 2021-07-18 DIAGNOSIS — E039 Hypothyroidism, unspecified: Secondary | ICD-10-CM | POA: Insufficient documentation

## 2021-07-18 DIAGNOSIS — I10 Essential (primary) hypertension: Secondary | ICD-10-CM | POA: Insufficient documentation

## 2021-07-18 DIAGNOSIS — Z20822 Contact with and (suspected) exposure to covid-19: Secondary | ICD-10-CM | POA: Insufficient documentation

## 2021-07-18 DIAGNOSIS — Z853 Personal history of malignant neoplasm of breast: Secondary | ICD-10-CM | POA: Insufficient documentation

## 2021-07-18 DIAGNOSIS — F22 Delusional disorders: Secondary | ICD-10-CM | POA: Diagnosis not present

## 2021-07-18 DIAGNOSIS — E119 Type 2 diabetes mellitus without complications: Secondary | ICD-10-CM | POA: Diagnosis not present

## 2021-07-18 DIAGNOSIS — R4182 Altered mental status, unspecified: Secondary | ICD-10-CM | POA: Diagnosis present

## 2021-07-18 LAB — URINALYSIS, COMPLETE (UACMP) WITH MICROSCOPIC
Bilirubin Urine: NEGATIVE
Glucose, UA: NEGATIVE mg/dL
Ketones, ur: 5 mg/dL — AB
Leukocytes,Ua: NEGATIVE
Nitrite: NEGATIVE
Protein, ur: NEGATIVE mg/dL
Specific Gravity, Urine: 1.008 (ref 1.005–1.030)
pH: 6 (ref 5.0–8.0)

## 2021-07-18 LAB — COMPREHENSIVE METABOLIC PANEL
ALT: 13 U/L (ref 0–44)
AST: 19 U/L (ref 15–41)
Albumin: 3.8 g/dL (ref 3.5–5.0)
Alkaline Phosphatase: 115 U/L (ref 38–126)
Anion gap: 7 (ref 5–15)
BUN: 16 mg/dL (ref 8–23)
CO2: 25 mmol/L (ref 22–32)
Calcium: 8.7 mg/dL — ABNORMAL LOW (ref 8.9–10.3)
Chloride: 107 mmol/L (ref 98–111)
Creatinine, Ser: 0.79 mg/dL (ref 0.44–1.00)
GFR, Estimated: >60 mL/min (ref >60)
Glucose, Bld: 131 mg/dL — ABNORMAL HIGH (ref 70–99)
Potassium: 3.8 mmol/L (ref 3.5–5.1)
Sodium: 139 mmol/L (ref 135–145)
Total Bilirubin: 0.4 mg/dL (ref 0.3–1.2)
Total Protein: 7.3 g/dL (ref 6.5–8.1)

## 2021-07-18 LAB — URINE DRUG SCREEN, QUALITATIVE (ARMC ONLY)
Amphetamines, Ur Screen: NOT DETECTED
Barbiturates, Ur Screen: NOT DETECTED
Benzodiazepine, Ur Scrn: NOT DETECTED
Cannabinoid 50 Ng, Ur ~~LOC~~: NOT DETECTED
Cocaine Metabolite,Ur ~~LOC~~: NOT DETECTED
MDMA (Ecstasy)Ur Screen: NOT DETECTED
Methadone Scn, Ur: NOT DETECTED
Opiate, Ur Screen: NOT DETECTED
Phencyclidine (PCP) Ur S: NOT DETECTED
Tricyclic, Ur Screen: NOT DETECTED

## 2021-07-18 LAB — CBC
HCT: 35 % — ABNORMAL LOW (ref 36.0–46.0)
Hemoglobin: 11.3 g/dL — ABNORMAL LOW (ref 12.0–15.0)
MCH: 29.4 pg (ref 26.0–34.0)
MCHC: 32.3 g/dL (ref 30.0–36.0)
MCV: 90.9 fL (ref 80.0–100.0)
Platelets: 437 10*3/uL — ABNORMAL HIGH (ref 150–400)
RBC: 3.85 MIL/uL — ABNORMAL LOW (ref 3.87–5.11)
RDW: 12.6 % (ref 11.5–15.5)
WBC: 7 10*3/uL (ref 4.0–10.5)
nRBC: 0 % (ref 0.0–0.2)

## 2021-07-18 LAB — ETHANOL: Alcohol, Ethyl (B): <10 mg/dL (ref <10)

## 2021-07-18 LAB — SARS CORONAVIRUS 2 BY RT PCR: SARS Coronavirus 2 by RT PCR: NEGATIVE

## 2021-07-18 LAB — HIV ANTIBODY (ROUTINE TESTING W REFLEX): HIV Screen 4th Generation wRfx: NONREACTIVE

## 2021-07-18 LAB — VALPROIC ACID LEVEL: Valproic Acid Lvl: <10 ug/mL — ABNORMAL LOW (ref 50.0–100.0)

## 2021-07-18 LAB — ACETAMINOPHEN LEVEL: Acetaminophen (Tylenol), Serum: <10 ug/mL — ABNORMAL LOW (ref 10–30)

## 2021-07-18 LAB — SALICYLATE LEVEL: Salicylate Lvl: <7 mg/dL — ABNORMAL LOW (ref 7.0–30.0)

## 2021-07-18 MED ORDER — LEVOTHYROXINE SODIUM 88 MCG PO TABS
88.0000 ug | ORAL_TABLET | Freq: Every day | ORAL | Status: DC
  Administered 2021-07-19: 88 ug via ORAL
  Filled 2021-07-18: qty 1

## 2021-07-18 MED ORDER — QUETIAPINE FUMARATE 25 MG PO TABS
25.0000 mg | ORAL_TABLET | Freq: Every day | ORAL | Status: DC
  Administered 2021-07-18: 25 mg via ORAL
  Filled 2021-07-18: qty 1

## 2021-07-18 MED ORDER — ALENDRONATE SODIUM 70 MG PO TABS: 70.0000 mg | ORAL_TABLET | ORAL | Status: DC

## 2021-07-18 MED ORDER — LETROZOLE 2.5 MG PO TABS
2.5000 mg | ORAL_TABLET | Freq: Every day | ORAL | Status: DC
  Administered 2021-07-19: 2.5 mg via ORAL
  Filled 2021-07-18: qty 1

## 2021-07-18 MED ORDER — VITAMIN D (ERGOCALCIFEROL) 1.25 MG (50000 UNIT) PO CAPS: 50000.0000 [IU] | ORAL_CAPSULE | ORAL | Status: DC

## 2021-07-18 MED ORDER — DIVALPROEX SODIUM 125 MG PO DR TAB
125.0000 mg | DELAYED_RELEASE_TABLET | Freq: Every day | ORAL | Status: DC
  Administered 2021-07-18: 125 mg via ORAL
  Filled 2021-07-18: qty 1

## 2021-07-18 MED ORDER — DIVALPROEX SODIUM 125 MG PO DR TAB: 125.0000 mg | DELAYED_RELEASE_TABLET | Freq: Once | ORAL | Status: DC

## 2021-07-18 MED ORDER — CLONAZEPAM 0.125 MG PO TBDP
0.2500 mg | ORAL_TABLET | Freq: Two times a day (BID) | ORAL | Status: DC
  Administered 2021-07-18 – 2021-07-19 (×2): 0.25 mg via ORAL
  Filled 2021-07-18 (×2): qty 2

## 2021-07-18 NOTE — ED Triage Notes (Signed)
Pt states she is here for a STD test, pt having a hard time recalling why she is here. Pt denies wanting to hurt herself. Pt stating she wants to go home after getting her tests.

## 2021-07-18 NOTE — ED Notes (Addendum)
Dinner tray and beverage provided 

## 2021-07-18 NOTE — ED Provider Notes (Addendum)
[facility] Provider Note    Event Date/Time   First MD Initiated Contact with Patient 07/18/21 1520     (approximate)   History   Altered Mental Status and Psychiatric Evaluation   HPI  JOHNAE FRILEY is a 71 y.o. female   past with past medical history of bipolar disorder, depression who presents requesting an STD check.  Patient came here because she wanted to be tested for HIV and syphilis.  She was brought in by EMS after family called because she was not acting like her normal self.  The patient tells me that she has been living alone taking care of herself.  Goes to exercise classes and has been feeling well.  Denies drug or alcohol use.  There was a suicide note that was found at home.  She tells me that she is not suicidal and that she was just annoyed but is not having suicidal ideation.  Denies drug or alcohol use.  Denies headache abdominal pain nausea vomiting.  I asked her why she is concerned about HIV and syphilis she says she is not sexually active currently but she has been in the past and consists concerned that it could have caught up with her.     Past Medical History:  Diagnosis Date   Acute cholecystitis 2016   Anemia    Anemia 2016   Anxiety    panic attacks   Anxiety and depression    Bipolar 1 disorder (HCC)    Bowel obstruction ([place]) 2017   Common bile duct dilatation 2015   Dyspnea    Edema    Fibromyalgia    GI bleed 2017   History of kidney stones 01/21/2009   HSV-1 infection    Hyperglycemia    Hyperlipidemia    Hypokalemia    Hypothyroidism    Liver cyst    Malignant neoplasm of right female breast, unspecified estrogen receptor status, unspecified site of breast ([place])    Memory loss    Osteopenia    Panic disorder    Schizophrenia ([place])    Ventral hernia     Patient Active Problem List   Diagnosis Date Noted   Major depressive disorder, recurrent severe without psychotic features ([place]) 04/21/2021    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[place]) 01/28/2021   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   Medical non-compliance 08/17/2020   Hepatic abscess 01/10/2016   History of diabetes mellitus, type II 11/04/2014   History of essential hypertension 11/04/2014   Intra-abdominal and pelvic swelling, mass and lump, unspecified site 11/03/2013     Physical Exam  Triage Vital Signs: ED Triage Vitals  Enc Vitals Group     BP 07/18/21 1456 107/80     Pulse Rate 07/18/21 1456 100     Resp 07/18/21 1456 18     Temp 07/18/21 1456 98.3 F (36.8 C)     Temp Source 07/18/21 1456 Oral     SpO2 07/18/21 1451 97 %     Weight 07/18/21 1457 111 lb 8.8 oz (50.6 kg)     Height 07/18/21 1457 5' 1.75" (1.568 m)     Head Circumference --      Peak Flow --      Pain Score 07/18/21 1455 0     Pain Loc --      Pain Edu? --      Excl. in [place]? --     Most recent vital signs: Vitals:   07/18/21 1456 07/18/21 2048  BP: 107/80 119/69  Pulse: 100 81  Resp: 18 18  Temp: 98.3 F (36.8 C)   SpO2: 98% 100%     General: Awake, no distress.  CV:  Good peripheral perfusion.  Resp:  Normal effort.  Abd:  No distention.  Neuro:             Awake, Alert, Oriented x 3  Other:  Aox3, nml speech  PERRL, EOMI, face symmetric, nml tongue movement  5/5 strength in the BL upper and lower extremities  Sensation grossly intact in the BL upper and lower extremities  Finger-nose-finger intact BL    ED Results / Procedures / Treatments  Labs (all labs ordered are listed, but only abnormal results are displayed) Labs Reviewed  COMPREHENSIVE METABOLIC PANEL - Abnormal; Notable for the following components:      Result Value   Glucose, Bld 131 (*)    Calcium 8.7 (*)    All other components within normal limits  SALICYLATE LEVEL - Abnormal; Notable for the following components:   Salicylate Lvl <1.6 (*)    All other components within normal limits  ACETAMINOPHEN LEVEL - Abnormal; Notable for the following  components:   Acetaminophen (Tylenol), Serum <10 (*)    All other components within normal limits  CBC - Abnormal; Notable for the following components:   RBC 3.85 (*)    Hemoglobin 11.3 (*)    HCT 35.0 (*)    Platelets 437 (*)    All other components within normal limits  URINALYSIS, COMPLETE (UACMP) WITH MICROSCOPIC - Abnormal; Notable for the following components:   Color, Urine YELLOW (*)    APPearance CLEAR (*)    Hgb urine dipstick MODERATE (*)    Ketones, ur 5 (*)    Bacteria, UA RARE (*)    All other components within normal limits  VALPROIC ACID LEVEL - Abnormal; Notable for the following components:   Valproic Acid Lvl <10 (*)    All other components within normal limits  SARS CORONAVIRUS 2 BY RT PCR  ETHANOL  URINE DRUG SCREEN, QUALITATIVE ([facility] ONLY)  HIV ANTIBODY (ROUTINE TESTING W REFLEX)  RPR     EKG     RADIOLOGY    PROCEDURES:  Critical Care performed: No  Procedures  The patient is on the cardiac monitor to evaluate for evidence of arrhythmia and/or significant heart rate changes.   MEDICATIONS ORDERED IN ED: Medications  levothyroxine (SYNTHROID) tablet 88 mcg (has no administration in time range)  alendronate (FOSAMAX) tablet 70 mg (has no administration in time range)  clonazepam (KLONOPIN) disintegrating tablet 0.25 mg (0.25 mg Oral Given 07/18/21 2123)  letrozole [facility]) tablet 2.5 mg (has no administration in time range)  QUEtiapine (SEROQUEL) tablet 25 mg (25 mg Oral Given 07/18/21 2123)  Vitamin D (Ergocalciferol) (DRISDOL) capsule 50,000 Units (has no administration in time range)  divalproex (DEPAKOTE) DR tablet 125 mg (125 mg Oral Given 07/18/21 2123)     IMPRESSION / MDM / ASSESSMENT AND PLAN / ED COURSE  I reviewed the triage vital signs and the nursing notes.                              Patient's presentation is most consistent with acute presentation with potential threat to life or bodily function.  Differential  diagnosis includes, but is not limited to, decompensated psychosis, metabolic abnormality, intoxication, infection  The patient is a 71 year old female who does have underlying  mental health issues who presents today because her family was concerned she is not acting normally.  She is having some paranoid thoughts specifically is requesting HIV and syphilis testing.  Per nursing patient seems to be confused is repetitive and having paranoia.  On my evaluation she is rather lucid she tells me that she lives alone goes to exercise classes and was just thinking about STDs today so this is why she wanted to come in.  She denies any suicidal ideation says that the suicide note was just because she was frustrated.  She has an intact neurologic exam.  Vitals are within normal limits I plan to obtain a CT of her head and get labs to screen for medical etiologies of her paranoia.  However I suspect this is primary psychiatric.  Psychiatry is been consulted.  We will keep the patient under IVC given the suicide note and family's concern.     The patient has been placed in psychiatric observation due to the need to provide a safe environment for the patient while obtaining psychiatric consultation and evaluation, as well as ongoing medical and medication management to treat the patient's condition.  The patient has been placed under full IVC at this time.   Patient was seen by psychiatry and they feel she would benefit from inpatient admission.  She is medically cleared.  FINAL CLINICAL IMPRESSION(S) / ED DIAGNOSES   Final diagnoses:  Paranoia ([place])     Rx / DC Orders   ED Discharge Orders     None        Note:  This document was prepared using Dragon voice recognition software and may include unintentional dictation errors.   Rada Hay, MD 07/18/21 1656    Rada Hay, MD 07/18/21 2221

## 2021-07-18 NOTE — ED Notes (Signed)
Pt up to bathroom.

## 2021-07-18 NOTE — Consult Note (Signed)
[facility] Psychiatry Consult   Reason for Consult:  SI Referring Physician:  Starleen Blue Patient Identification: Elizabeth [name] MRN:  [id number] Principal Diagnosis: Major depressive disorder, recurrent severe without psychotic features ([place]) Diagnosis:  Principal Problem:   Major depressive disorder, recurrent severe without psychotic features ([place])   Total Time spent with patient: 1 hour  Subjective:   Elizabeth [name] is a 71 y.o. female patient admitted with suicidal ideation.  HPI:  Patient is minimizing the note that she had on her table, stating that she wanted to die. She says she wrote the note thinking that she would "die some day."  She is somewhat tangential and keeps saying that she likes to go out, but can't get rides. She likes to go to the [employer] and a group at the cancer center. She says she lives alone, but is "active." Says friends visit. When asked if she is suicidal she states "Everybody is sad sometimes." Eventually denies that she wants to die. She does not want to talk about previous suicide attempts. Denies AVH, paranoia. She interrupts during conversation asking if she can go home. She thinks she has HIV because "I lost weight." She has odd affect/presentation   Collateral from son, Skyeler Scalese, [phone]: Today she was telling people that she fell today. She tripped on Sunday and had small cuts on face. (Son has cameras in the house and confirmed this. A friend of hers came and stayed with her that  evening. 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 She says she wants to get out and do things, but when she has the opportunity she doesn't want to go. She will cancel the transportation rides that son sets up. Son says patient has good friends and neighbors that check on her.  I think she is just really sad/depressed. A friend stops by every 2 to three days. "I don't know that she is suicidal,  but she is very depressed." [facility] closed her case and I don't think that was a good idea. She was doing well when they were coming out. Scheduled go to [facility] every Wednesday and Friday, but makes excuses not to go.  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 Patient has perseverated on that because the provider asked her about it. Son asks [place] to run those tests, which EDP did.  Patient saw a psychologist and tried to refer her to body dysmorphia specialist but insurance doesn't cover.  Her personality is a little "dependent."  Son has "Meals on Wheels" for her. Groceries are delivered. Son visits every few weeks, he lives 3.5 hours away. Son agrees that inpatient is warranted.   Past Psychiatric History: MDD  Risk to Self:   Risk to Others:   Prior Inpatient Therapy:   Prior Outpatient Therapy:    Past Medical History:  Past Medical History:  Diagnosis Date   Acute cholecystitis 2016   Anemia    Anemia 2016   Anxiety    panic attacks   Anxiety and depression    Bipolar 1 disorder ([place])    Bowel obstruction ([place]) 2017   Common bile duct dilatation 2015   Dyspnea    Edema    Fibromyalgia    GI bleed 2017   History of kidney stones 01/21/2009   HSV-1 infection  Hyperglycemia    Hyperlipidemia    Hypokalemia    Hypothyroidism    Liver cyst    Malignant neoplasm of right female breast, unspecified estrogen receptor status, unspecified site of breast ([place])    Memory loss    Osteopenia    Panic disorder    Schizophrenia ([place])    Ventral hernia     Past Surgical History:  Procedure Laterality Date   ABDOMINAL HYSTERECTOMY     BREAST BIOPSY Right 01/19/2021   [place] Bx, ribbon, path pending   BREAST BIOPSY Right 01/19/2021   [place] Bx, Venus, path pending   BREAST BIOPSY Right 01/19/2021   [place] Bx, heart, path pending   BREAST BIOPSY Right  01/19/2021   [place] BX Axilla, Coil, path pending   CESAREAN SECTION     CHOLECYSTECTOMY OPEN  11/17/2013   with repair of bile duct-Dr. Marina Gravel   MASTECTOMY MODIFIED RADICAL Right 03/02/2021   Procedure: MASTECTOMY MODIFIED RADICAL;  Surgeon: Benjamine Sprague, DO;  Location: [facility] ORS;  Service: General;  Laterality: Right;   OPEN REDUCTION INTERNAL FIXATION (ORIF) DISTAL RADIAL FRACTURE Right 12/20/2014   Procedure: OPEN REDUCTION INTERNAL FIXATION (ORIF) RIGHT DISTAL RADIAL FRACTURE;  Surgeon: Renette Butters, MD;  Location: [facility];  Service: Orthopedics;  Laterality: Right;   STOMACH SURGERY     Family History:  Family History  Problem Relation Age of Onset   Heart disease Father    Breast cancer Paternal Aunt    Kidney disease Paternal 27    Breast cancer Paternal Grandmother    Asthma Daughter    Bladder Cancer Neg Hx    Family Psychiatric  History:  Social History:  Social History   Substance and Sexual Activity  Alcohol Use No   Alcohol/week: 0.0 standard drinks of alcohol     Social History   Substance and Sexual Activity  Drug Use No    Social History   Socioeconomic History   Marital status: Married    Spouse name: Not on file   Number of children: 1   Years of education: Not on file   Highest education level: Not on file  Occupational History   Not on file  Tobacco Use   Smoking status: Former    Packs/day: 0.00    Years: 0.00    Total pack years: 0.00    Types: Cigarettes    Quit date: 11/03/1973    Years since quitting: 47.7    Passive exposure: Past   Smokeless tobacco: Never   Tobacco comments:    Smoked briefly as a teen- lives in secondhand smoke.   Vaping Use   Vaping Use: Never used  Substance and Sexual Activity   Alcohol use: No    Alcohol/week: 0.0 standard drinks of alcohol   Drug use: No   Sexual activity: Not Currently  Other Topics Concern   Not on file  Social History Narrative   Not on file   Social Determinants  of Health   Financial Resource Strain: Not on file  Food Insecurity: Not on file  Transportation Needs: Not on file  Physical Activity: Not on file  Stress: Not on file  Social Connections: Not on file   Additional Social History:    Allergies:   Allergies  Allergen Reactions   Penicillins Hives    TOLERATED CEFAZOLIN    Labs:  Results for orders placed or performed during the hospital encounter of 07/18/21 (from the past 48 hour(s))  Comprehensive metabolic panel  Status: Abnormal   Collection Time: 07/18/21  2:58 PM  Result Value Ref Range   Sodium 139 135 - 145 mmol/L   Potassium 3.8 3.5 - 5.1 mmol/L   Chloride 107 98 - 111 mmol/L   CO2 25 22 - 32 mmol/L   Glucose, Bld 131 (H) 70 - 99 mg/dL    Comment: Glucose reference range applies only to samples taken after fasting for at least 8 hours.   BUN 16 8 - 23 mg/dL   Creatinine, Ser 0.79 0.44 - 1.00 mg/dL   Calcium 8.7 (L) 8.9 - 10.3 mg/dL   Total Protein 7.3 6.5 - 8.1 g/dL   Albumin 3.8 3.5 - 5.0 g/dL   AST 19 15 - 41 U/L   ALT 13 0 - 44 U/L   Alkaline Phosphatase 115 38 - 126 U/L   Total Bilirubin 0.4 0.3 - 1.2 mg/dL   GFR, Estimated >60 >60 mL/min    Comment: (NOTE) Calculated using the CKD-EPI Creatinine Equation (2021)    Anion gap 7 5 - 15    Comment: Performed at [facility], [address] [place], [place] [address]  Ethanol     Status: None   Collection Time: 07/18/21  2:58 PM  Result Value Ref Range   Alcohol, Ethyl (B) <10 <10 mg/dL    Comment: (NOTE) Lowest detectable limit for serum alcohol is 10 mg/dL.  For medical purposes only. Performed at [facility], [address] [place], [place] [address]   Salicylate level     Status: Abnormal   Collection Time: 07/18/21  2:58 PM  Result Value Ref Range   Salicylate Lvl <0.9 (L) 7.0 - 30.0 mg/dL    Comment: Performed at [facility], [address] [place], [place] [address]  Acetaminophen level     Status:  Abnormal   Collection Time: 07/18/21  2:58 PM  Result Value Ref Range   Acetaminophen (Tylenol), Serum <10 (L) 10 - 30 ug/mL    Comment: (NOTE) Therapeutic concentrations vary significantly. A range of 10-30 ug/mL  may be an effective concentration for many patients. However, some  are best treated at concentrations outside of this range. Acetaminophen concentrations >150 ug/mL at 4 hours after ingestion  and >50 ug/mL at 12 hours after ingestion are often associated with  toxic reactions.  Performed at [facility], [address] [place], [place] [address]   cbc     Status: Abnormal   Collection Time: 07/18/21  2:58 PM  Result Value Ref Range   WBC 7.0 4.0 - 10.5 K/uL   RBC 3.85 (L) 3.87 - 5.11 MIL/uL   Hemoglobin 11.3 (L) 12.0 - 15.0 g/dL   HCT 35.0 (L) 36.0 - 46.0 %   MCV 90.9 80.0 - 100.0 fL   MCH 29.4 26.0 - 34.0 pg   MCHC 32.3 30.0 - 36.0 g/dL   RDW 12.6 11.5 - 15.5 %   Platelets 437 (H) 150 - 400 K/uL   nRBC 0.0 0.0 - 0.2 %    Comment: Performed at [facility] [address] [place], [place] [address]  Urine Drug Screen, Qualitative     Status: None   Collection Time: 07/18/21  2:58 PM  Result Value Ref Range   Tricyclic, Ur Screen NONE DETECTED NONE DETECTED   Amphetamines, Ur Screen NONE DETECTED NONE DETECTED   MDMA (Ecstasy)Ur Screen NONE DETECTED NONE DETECTED   Cocaine Metabolite,Ur [place] NONE DETECTED NONE DETECTED   Opiate, Ur Screen  NONE DETECTED NONE DETECTED   Phencyclidine (PCP) Ur S NONE DETECTED NONE DETECTED   Cannabinoid 50 Ng, Ur [place] NONE DETECTED NONE DETECTED   Barbiturates, Ur Screen NONE DETECTED NONE DETECTED   Benzodiazepine, Ur Scrn NONE DETECTED NONE DETECTED   Methadone Scn, Ur NONE DETECTED NONE DETECTED    Comment: (NOTE) Tricyclics + metabolites, urine    Cutoff 1000 ng/mL Amphetamines + metabolites, urine  Cutoff 1000 ng/mL MDMA (Ecstasy), urine              Cutoff 500 ng/mL Cocaine Metabolite, urine           Cutoff 300 ng/mL Opiate + metabolites, urine        Cutoff 300 ng/mL Phencyclidine (PCP), urine         Cutoff 25 ng/mL Cannabinoid, urine                 Cutoff 50 ng/mL Barbiturates + metabolites, urine  Cutoff 200 ng/mL Benzodiazepine, urine              Cutoff 200 ng/mL Methadone, urine                   Cutoff 300 ng/mL  The urine drug screen provides only a preliminary, unconfirmed analytical test result and should not be used for non-medical purposes. Clinical consideration and professional judgment should be applied to any positive drug screen result due to possible interfering substances. A more specific alternate chemical method must be used in order to obtain a confirmed analytical result. Gas chromatography / mass spectrometry (GC/MS) is the preferred confirm atory method. Performed at [facility], [address] [place], [place] [address]   Urinalysis, Complete w Microscopic     Status: Abnormal   Collection Time: 07/18/21  2:58 PM  Result Value Ref Range   Color, Urine YELLOW (A) YELLOW   APPearance CLEAR (A) CLEAR   Specific Gravity, Urine 1.008 1.005 - 1.030   pH 6.0 5.0 - 8.0   Glucose, UA NEGATIVE NEGATIVE mg/dL   Hgb urine dipstick MODERATE (A) NEGATIVE   Bilirubin Urine NEGATIVE NEGATIVE   Ketones, ur 5 (A) NEGATIVE mg/dL   Protein, ur NEGATIVE NEGATIVE mg/dL   Nitrite NEGATIVE NEGATIVE   Leukocytes,Ua NEGATIVE NEGATIVE   RBC / HPF 0-5 0 - 5 RBC/hpf   WBC, UA 0-5 0 - 5 WBC/hpf   Bacteria, UA RARE (A) NONE SEEN   Squamous Epithelial / LPF 0-5 0 - 5    Comment: Performed at [facility], [address], [place], [place] [address]    Current Facility-Administered Medications  Medication Dose Route Frequency Provider Last Rate Last Admin   [START ON 07/23/2021] alendronate (FOSAMAX) tablet 70 mg  70 mg Oral Q Mon Sianne Tejada F, NP       clonazepam (KLONOPIN) disintegrating tablet 0.25 mg  0.25 mg Oral BID Waldon Merl F, NP        divalproex (DEPAKOTE) DR tablet 125 mg  125 mg Oral QHS Sherlon Handing, NP       [START ON 07/19/2021] letrozole [facility]) tablet 2.5 mg  2.5 mg Oral Daily Sherlon Handing, NP       [START ON 07/19/2021] levothyroxine (SYNTHROID) tablet 88 mcg  88 mcg Oral QAC breakfast Waldon Merl F, NP       QUEtiapine (SEROQUEL) tablet 25 mg  25 mg Oral QHS Sherlon Handing, NP       [START ON 07/22/2021] Vitamin D (Ergocalciferol) (  DRISDOL) capsule 50,000 Units  50,000 Units Oral Weekly Sherlon Handing, NP       Current Outpatient Medications  Medication Sig Dispense Refill   alendronate (FOSAMAX) 70 MG tablet Take 1 tablet by mouth every Monday.     clonazePAM (KLONOPIN) 0.25 MG disintegrating tablet Take 0.25 mg by mouth 2 (two) times daily as needed.     clonazePAM (KLONOPIN) 1 MG tablet Take 1 mg by mouth 2 (two) times daily as needed for anxiety.     divalproex (DEPAKOTE) 125 MG DR tablet Take by mouth.     letrozole (FEMARA) 2.5 MG tablet Take 1 tablet (2.5 mg total) by mouth daily. 90 tablet 3   levothyroxine (SYNTHROID, LEVOTHROID) 88 MCG tablet Take 88 mcg by mouth daily before breakfast.     QUEtiapine (SEROQUEL) 25 MG tablet Take 1 tablet (25 mg total) by mouth at bedtime. 30 tablet 0   Vitamin D, Ergocalciferol, (DRISDOL) 1.25 MG (50000 UNIT) CAPS capsule Take 50,000 Units by mouth once a week.      Musculoskeletal: Strength & Muscle Tone: within normal limits Gait & Station: normal Patient leans: N/A    Psychiatric Specialty Exam:  Presentation  General Appearance: Appropriate for Environment  Eye Contact:Good  Speech:Clear and Coherent  Speech Volume:Normal  Handedness:No data recorded  Mood and Affect  Mood:Anxious; Labile  Affect:Congruent   Thought Process  Thought Processes:Disorganized; Irrevelant (At times, throughout conversation)  Descriptions of Associations:Intact  Orientation:Full (Time, Place and Person)  Thought Content:Tangential  (Illogical at times)  History of Schizophrenia/Schizoaffective disorder:No  Duration of Psychotic Symptoms:Greater than six months  Hallucinations:Hallucinations: None (Denies)  Ideas of Reference:Delusions (Thinks she may have STI.  Not sexually active)  Suicidal Thoughts:Suicidal Thoughts: Yes, Passive (Denies, had a note on her coffee table that she had written she wanted to die) SI Passive Intent and/or Plan: Without Intent  Homicidal Thoughts:Homicidal Thoughts: No   Sensorium  Memory:Immediate Poor  Judgment:Poor  Insight:Poor   Executive Functions  Concentration:Poor  Attention Span:Poor  [place]   Psychomotor Activity  Psychomotor Activity:Psychomotor Activity: Normal  Assets  Assets:Social Support; Resilience; Housing; Financial Resources/Insurance   Sleep  Sleep:Sleep: Fair  Physical Exam: Physical Exam Vitals and nursing note reviewed.  HENT:     Head: Normocephalic.     Nose: No congestion or rhinorrhea.  Eyes:     General:        Right eye: No discharge.        Left eye: No discharge.  Cardiovascular:     Rate and Rhythm: Normal rate.  Pulmonary:     Effort: Pulmonary effort is normal.  Musculoskeletal:        General: Normal range of motion.     Cervical back: Normal range of motion.  Skin:    General: Skin is dry.  Neurological:     Mental Status: She is alert and oriented to person, place, and time.  Psychiatric:        Attention and Perception: Attention normal.        Mood and Affect: Mood is anxious. Affect is labile.        Speech: Speech normal.        Behavior: Behavior is cooperative.        Thought Content: Thought content includes suicidal ideation.        Cognition and Memory: Cognition is impaired.        Judgment: Judgment is impulsive.    Review of  Systems  Psychiatric/Behavioral:  Positive for depression and suicidal ideas.    Blood pressure 107/80, pulse 100,  temperature 98.3 F (36.8 C), temperature source Oral, resp. rate 18, height 5' 1.75" (1.568 m), weight 50.6 kg, SpO2 98 %. Body mass index is 20.57 kg/m.  Treatment Plan Summary: Daily contact with patient to assess and evaluate symptoms and progress in treatment, Medication management, and Plan refer patient for inpatient psychiatric treatment when medically cleared.  Reviewed with the EDP  Disposition: Recommend psychiatric Inpatient admission when medically cleared.  Sherlon Handing, NP 07/18/2021 6:20 PM

## 2021-07-18 NOTE — BH Assessment (Addendum)
PATIENT BED AVAILABLE AFTER 9AM 07/19/21  Patient has been accepted to [facility]  Patient assigned to [facility] physician is Dr. Kathlene Cote.  Call report to [phone].  Representative was [place].   ER Staff is aware of it:  [facility] ER Secretary  Dr. Starleen Blue, ER MD  Joelene Millin Patient's Nurse     Patient's Family/Support System Elizabeth [name]- patient's son [phone]) have been updated as well.

## 2021-07-18 NOTE — ED Notes (Signed)
Pt given sandwich tray and water.  Ambulated to bathroom.  Resting in bed at this time

## 2021-07-18 NOTE — ED Notes (Signed)
IVC/Consult Completed/Rec.Inpt Admit/Accepted to [facility] in [place].

## 2021-07-18 NOTE — Progress Notes (Signed)
[place] CSW Progress Note  Clinical Education officer, museum contacted patient by phone to follow-up on voicemail left by patient.  Patient did not specify in the voicemail ay needs or concerns.  CSW left voicemail with contact information and request for return call.    Adelene Amas, LCSW

## 2021-07-18 NOTE — ED Notes (Signed)
Belongings:  2 silver necklaces 1 pair of pants 1 pink shirt  1 pair of tennis shoes 1 nude bra 1 pair of brown socks Cell phone Charger Make up bag Arm Sleeve

## 2021-07-18 NOTE — ED Notes (Signed)
INVOLUNTARY with all papers on chart/awaiting TTS/PSYCH consult ?

## 2021-07-18 NOTE — BH Assessment (Signed)
Comprehensive Clinical Assessment (CCA) Note  07/18/2021 Elizabeth [name] [id number]  Chief Complaint: Patient is a 71 year old female presenting to [facility] ED under IVC. Per triage note Pt comes into the ED via EMS from home, family called because pt is not acting her normal self and fell yesterday. EMS pt having paranoid behavior and wrote 2 notes with suicidal ideations. Pt thinks she has an STD that if she touches someone it will kill them. During assessment patient appears alert and oriented x4, calm and cooperative. Patient reports "somebody found a note, I left the kids a note but it wasn't a note saying a wanted to die." Patient reports "I don't need to be institutionalized, I have a psychiatrist." "I need for you all to get these things off my arms." Patient denies SI/HI/AH/VH.  Per Psyc NP Waldon Merl patient is recommended for Inpatient Chief Complaint  Patient presents with   Altered Mental Status   Psychiatric Evaluation   Visit Diagnosis: Depression    CCA Screening, Triage and Referral (STR)  Patient Reported Information How did you hear about [place]? Legal System  Referral name: No data recorded Referral phone number: No data recorded  Whom do you see for routine medical problems? No data recorded Practice/Facility Name: No data recorded Practice/Facility Phone Number: No data recorded Name of Contact: No data recorded Contact Number: No data recorded Contact Fax Number: No data recorded Prescriber Name: No data recorded Prescriber Address (if known): No data recorded  What Is the Reason for Your Visit/Call Today? Patient presents under IVC due to patient writing a suicide note and paranoid  How Long Has This Been Causing You Problems? > than 6 months  What Do You Feel Would Help You the Most Today? Medication(s)   Have You Recently Been in Any Inpatient Treatment (Hospital/Detox/Crisis Center/28-Day Program)? No data recorded Name/Location of  Program/Hospital:No data recorded How Long Were You There? No data recorded When Were You Discharged? No data recorded  Have You Ever Received Services From [facility] Before? No data recorded Who Do You See at [facility]? No data recorded  Have You Recently Had Any Thoughts About Hurting Yourself? No  Are You Planning to Commit Suicide/Harm Yourself At This time? No   Have you Recently Had Thoughts About [place]? No  Explanation: No data recorded  Have You Used Any Alcohol or Drugs in the Past 24 Hours? No  How Long Ago Did You Use Drugs or Alcohol? No data recorded What Did You Use and How Much? No data recorded  Do You Currently Have a Therapist/Psychiatrist? Yes  Name of Therapist/Psychiatrist: Unknown   Have You Been Recently Discharged From Any Office Practice or Programs? No  Explanation of Discharge From Practice/Program: No data recorded    CCA Screening Triage Referral Assessment Type of Contact: Face-to-Face  Is this Initial or Reassessment? Initial Assessment  Date Telepsych consult ordered in [facility]:  09/03/20  Time Telepsych consult ordered in [facility]:  [place]   Patient Reported Information Reviewed? No data recorded Patient Left Without Being Seen? No data recorded Reason for Not Completing Assessment: No data recorded  Collateral Involvement: healthcare POA- Gladys   Does Patient Have a [place]pster? No data recorded Name and Contact of Legal Guardian: No data recorded If Minor and Not Living with Parent(s), Who has Custody? No data recorded Is CPS involved or ever been involved? Never  Is APS involved or ever been involved? Never   Patient Determined  To Be At Risk for Harm To Self or Others Based on Review of Patient Reported Information or Presenting Complaint? No  Method: No data recorded Availability of Means: No data recorded Intent: No data recorded Notification Required: No data recorded Additional  Information for Danger to Others Potential: No data recorded Additional Comments for Danger to Others Potential: No data recorded Are There Guns or Other Weapons in Your Home? No data recorded Types of Guns/Weapons: No data recorded Are These Weapons Safely Secured?                            No data recorded Who Could Verify You Are Able To Have These Secured: No data recorded Do You Have any Outstanding Charges, Pending Court Dates, Parole/Probation? No data recorded Contacted To Inform of Risk of Harm To Self or Others: No data recorded  Location of Assessment: [facility] ED   Does Patient Present under Involuntary Commitment? Yes  IVC Papers Initial File Date: 07/18/21   [place] of Residence: [place]   Patient Currently Receiving the Following Services: Medication Management   Determination of Need: Emergent (2 hours)   Options For Referral: Medication Management; Outpatient Therapy     CCA Biopsychosocial Intake/Chief Complaint:  No data recorded Current Symptoms/Problems: No data recorded  Patient Reported Schizophrenia/Schizoaffective Diagnosis in Past: No   Strengths: Patient is able to communicate her needs  Preferences: No data recorded Abilities: No data recorded  Type of Services Patient Feels are Needed: No data recorded  Initial Clinical Notes/Concerns: No data recorded  Mental Health Symptoms Depression:   Change in energy/activity; Difficulty Concentrating; Fatigue   Duration of Depressive symptoms:  Greater than two weeks   Mania:   None   Anxiety:    Fatigue; Worrying   Psychosis:   Delusions   Duration of Psychotic symptoms:  Greater than six months   Trauma:   None   Obsessions:   Poor insight; Recurrent & persistent thoughts/impulses/images   Compulsions:   Absent insight/delusional   Inattention:   None   Hyperactivity/Impulsivity:   None   Oppositional/Defiant Behaviors:   None   Emotional Irregularity:   None    Other Mood/Personality Symptoms:  No data recorded   Mental Status Exam Appearance and self-care  Stature:   Average   Weight:   Thin   Clothing:   Casual   Grooming:   Normal   Cosmetic use:   None   Posture/gait:   Normal   Motor activity:   Not Remarkable   Sensorium  Attention:   Normal   Concentration:   Normal   Orientation:   X5   Recall/memory:   Normal   Affect and Mood  Affect:   Appropriate   Mood:   Anxious   Relating  Eye contact:   Normal   Facial expression:   Responsive   Attitude toward examiner:   Cooperative   Thought and Language  Speech flow:  Clear and Coherent   Thought content:   Delusions   Preoccupation:   Ruminations   Hallucinations:   None   Organization:  No data recorded  [employer] of Knowledge:   Fair   Intelligence:   Average   Abstraction:   Functional   Judgement:   Fair   Reality Testing:   Adequate   Insight:   Lacking   Decision Making:   Normal   Social Functioning  Social Maturity:  Responsible   Social Judgement:   Normal   Stress  Stressors:   Other (Comment)   Coping Ability:   Normal   Skill Deficits:   None   Supports:   Family; Friends/Service system     Religion: Religion/Spirituality Are You A Religious Person?: No  Leisure/Recreation: Leisure / Recreation Do You Have Hobbies?: No  Exercise/Diet: Exercise/Diet Do You Exercise?: No Have You Gained or Lost A Significant Amount of Weight in the Past Six Months?: No Do You Follow a Special Diet?: No Do You Have Any Trouble Sleeping?: No   CCA Employment/Education Employment/Work Situation: Employment / Work Nurse, children's Situation: Retired Social research officer, government has Been Impacted by Current Illness: No  Education: Education Is Patient Currently Attending School?: No Did You Have An Individualized Education Program (IIEP): No Did You Have Any Difficulty At [employer]?:  No Patient's Education Has Been Impacted by Current Illness: No   CCA Family/Childhood History Family and Relationship History: Family history Marital status: Other (comment) Does patient have children?: Yes How many children?: 2 How is patient's relationship with their children?: Unknown  Childhood History:  Childhood History Did patient suffer any verbal/emotional/physical/sexual abuse as a child?: No Did patient suffer from severe childhood neglect?: No Has patient ever been sexually abused/assaulted/raped as an adolescent or adult?: No Was the patient ever a victim of a crime or a disaster?: No Witnessed domestic violence?: No Has patient been affected by domestic violence as an adult?: No  Child/Adolescent Assessment:     CCA Substance Use Alcohol/Drug Use: Alcohol / Drug Use Pain Medications: See MAR Prescriptions: See MAR Over the Counter: See MAR History of alcohol / drug use?: No history of alcohol / drug abuse                         ASAM's:  Six Dimensions of Multidimensional Assessment  Dimension 1:  Acute Intoxication and/or Withdrawal Potential:      Dimension 2:  Biomedical Conditions and Complications:      Dimension 3:  Emotional, Behavioral, or Cognitive Conditions and Complications:     Dimension 4:  Readiness to Change:     Dimension 5:  Relapse, Continued use, or Continued Problem Potential:     Dimension 6:  Recovery/Living Environment:     ASAM Severity Score:    ASAM Recommended Level of Treatment:     Substance use Disorder (SUD)    Recommendations for Services/Supports/Treatments:    DSM5 Diagnoses: Patient Active Problem List   Diagnosis Date Noted   Major depressive disorder, recurrent severe without psychotic features ([place]) 04/21/2021   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[place]) 01/28/2021   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   Medical non-compliance 08/17/2020   Hepatic abscess 01/10/2016   History of diabetes  mellitus, type II 11/04/2014   History of essential hypertension 11/04/2014   Intra-abdominal and pelvic swelling, mass and lump, unspecified site 11/03/2013    Patient Centered Plan: Patient is on the following Treatment Plan(s):  Depression   Referrals to Alternative Service(s): Referred to Alternative Service(s):   Place:   Date:   Time:    Referred to Alternative Service(s):   Place:   Date:   Time:    Referred to Alternative Service(s):   Place:   Date:   Time:    Referred to Alternative Service(s):   Place:   Date:   Time:      '@BHCOLLABOFCARE'$ @  [employer], LCAS-A

## 2021-07-18 NOTE — Progress Notes (Signed)
PHARMACIST - PHYSICIAN COMMUNICATION  CONCERNING: P&T Medication Policy Regarding Oral Bisphosphonates  RECOMMENDATION: Your order for alendronate (Fosamax), ibandronate (Boniva), or risedronate (Actonel) has been discontinued at this time.  If the patient's post-hospital medical condition warrants safe use of this class of drugs, please resume the pre-hospital regimen upon discharge.  DESCRIPTION:  Alendronate (Fosamax), ibandronate (Boniva), and risedronate (Actonel) can cause severe esophageal erosions in patients who are unable to remain upright at least 30 minutes after taking this medication.   Since brief interruptions in therapy are thought to have minimal impact on bone mineral density, the [name] has established that bisphosphonate orders should be routinely discontinued during hospitalization.   To override this safety policy and permit administration of Boniva, Fosamax, or Actonel in the hospital, prescribers must write "DO NOT HOLD" in the comments section when placing the order for this class of medications.   Renda Rolls, PharmD, [facility] 07/18/2021 11:01 PM

## 2021-07-18 NOTE — BH Assessment (Signed)
Referral information for Psychiatric Hospitalization faxed to;  [facility] ([phone]- [phone]),   [facility] [phone] -[phone]) [phone]  [facility]([phone],  [address][phone],   [facility]([phone]- [phone],   [facility]([phone] [phone],

## 2021-07-18 NOTE — ED Triage Notes (Signed)
Pt comes into the ED via EMS from home, family called because pt is not acting her normal self and fell yesterday. EMS pt having paranoid behavior and wrote 2 notes with suicidal ideations. Pt thinks she has an STD that if she touches someone it will kill them.Marland Kitchen

## 2021-07-19 ENCOUNTER — Other Ambulatory Visit: Payer: Self-pay

## 2021-07-19 ENCOUNTER — Telehealth: Payer: Self-pay

## 2021-07-19 ENCOUNTER — Encounter: Payer: Self-pay | Admitting: Oncology

## 2021-07-19 LAB — RPR: RPR Ser Ql: NONREACTIVE

## 2021-07-19 MED ORDER — LETROZOLE 2.5 MG PO TABS: 2.5000 mg | ORAL_TABLET | Freq: Every day | ORAL | Status: DC

## 2021-07-19 MED ORDER — LETROZOLE 2.5 MG PO TABS
2.5000 mg | ORAL_TABLET | Freq: Every day | ORAL | 3 refills | Status: DC
Start: 2021-07-19 — End: 2022-11-28
  Filled 2021-07-19: qty 90, 90d supply, fill #0
  Filled 2021-11-04: qty 90, 90d supply, fill #1
  Filled 2022-06-26: qty 90, 90d supply, fill #2

## 2021-07-19 NOTE — Telephone Encounter (Signed)
Place patient on medical hold for CARE as patient is admitted in hospital. Will need to be cleared back or can be discharged and re-referred when appropriate to come back. Will re-evaluate in 2 weeks to see plan for patient.

## 2021-07-19 NOTE — ED Notes (Signed)
[facility] CALLED TO TRANSPORT TO [facility].

## 2021-07-19 NOTE — ED Notes (Signed)
EMTALA reviewed by charge RN 

## 2021-07-19 NOTE — ED Provider Notes (Signed)
Emergency Medicine Observation Re-evaluation Note  Elizabeth [name] is a 70 y.o. female, seen on rounds today.  Pt initially presented to the ED for complaints of Altered Mental Status and Psychiatric Evaluation Currently, the patient is resting, voices no medical complaints.  Physical Exam  BP 119/69 (BP Location: Left Arm)   Pulse 81   Temp 98.3 F (36.8 C) (Oral)   Resp 18   Ht 5' 1.75" (1.568 m)   Wt 50.6 kg   SpO2 100%   BMI 20.57 kg/m  Physical Exam General: Resting in no acute distress Cardiac: No cyanosis Lungs: Equal rise and fall Psych: Not agitated  ED Course / MDM  EKG:   I have reviewed the labs performed to date as well as medications administered while in observation.  Recent changes in the last 24 hours include no events overnight.  Plan  Current plan is for psychiatric disposition. Elizabeth [name] is under involuntary commitment.      Paulette Blanch, MD 07/19/21 [phone]

## 2021-07-19 NOTE — Discharge Instructions (Addendum)
[facility] to [facility] continue your Femara as prescribed

## 2021-07-19 NOTE — ED Notes (Signed)
Pt given phone to speak to her son; aware of pending transport.

## 2021-07-19 NOTE — ED Notes (Signed)
Pt given breakfast tray

## 2021-07-26 ENCOUNTER — Inpatient Hospital Stay: Payer: Medicare HMO | Attending: Oncology | Admitting: Licensed Clinical Social Worker

## 2021-07-26 DIAGNOSIS — C50911 Malignant neoplasm of unspecified site of right female breast: Secondary | ICD-10-CM

## 2021-07-26 NOTE — Progress Notes (Signed)
[place] CSW Progress Note  Clinical Social Worker contacted caregiver by phone to follow-up after patient's recent in-patient psychiatric care admission.  Patient was discharged today.  CSW spoke with patient's son Dalila Arca, and discussed different options to assist patient with care.  CSW has referred patient to outside mental health and psychiatric care providers.  CSW has recommended Partial Hospitalization Programs and has sent Mr. Roeder information on those providers and programs.  CSW and Mr. Headings spoke about capacity and guardianship. CSW recommended Ms. Espe speak with patient's PCP or psychiatrist to determine patient capacity.  CSW stated I will continue to follow-up with patient but patient mental healthcare needs exceed available resources at the cancer center. CSW has stated this to Mr. Pasternak and he has verbalized understanding.    Adelene Amas, LCSW

## 2021-07-30 NOTE — Progress Notes (Unsigned)
[place] MD/PA/NP OP Progress Note  08/02/2021 5:37 PM Elizabeth [name]  MRN:  [id number]  Chief Complaint:  Chief Complaint  Patient presents with   Follow-up   HPI:  - she was admitted to [facility] since the last visit. There was reportedly a suicide note on the table. Per record, "she is having some paranoid thoughts specifically is requesting HIV and syphilis testing.  Per nursing patient seems to be confused is repetitive and having paranoia.  On my evaluation she is rather lucid she tells me that she lives alone goes to exercise classes and was just thinking about STDs today so this is why she wanted to come in.  She denies any suicidal ideation says that the suicide note was just because she was frustrated.  She has an intact neurologic exam.  Vitals are within normal limits I plan to obtain a CT of her head and get labs to screen for medical etiologies of her paranoia.  However I suspect this is primary psychiatric.  Psychiatry is been consulted.  We will keep the patient under IVC given the suicide note and family's concern." Obtained record from [facility].  Dx bipolar II disorder (details of HPI was not available), Sertraline 25 mg daily was started.   Head CT 6/23  FINDINGS: Brain: No acute intracranial abnormality. Specifically, no hemorrhage, hydrocephalus, mass lesion, acute infarction, or significant intracranial injury.  This is a scheduled follow-up for bipolar disorder. Upon arrival to the office, she declined to sign a consent form as she does not think she can make a payment.  Despite the encouragement from the front desk, she declined this. The front desk notified her son, who was going to call her to sign. This clinician went out in the waiting room and discussed the rationale of her visit. Although she was initially adamant not to sign due to concern of payment, she later agrees, stating that she wants to talk with this Probation officer.   Elizabeth [name], her son joined on the phone for  collaterals. He states that the reception is not good as he is at work. (There was occasionally interruption likely due to reception) He states that she was admitted to [facility]. Sertraline was started, and they discontinued clonazepam.  He is not sure whether she has been taking it consistently.  Although he calls her every day, he is unable to see her every day.  He has been working with Education officer, museum to work on getting home aids. SW will reportedly visit her every week.  He thinks she was doing better when she was in the hospital.  He assumes it was due to her taking medication consistently.  He feels good about her going back home, and continuing current medication except Depakote.   Elizabeth [name] states that her teeth got yellow, and she needs to take it out. She perseverates on this many times during the visit.  When she was asked about the recent admission, she states that it was because her son had wellness check. She does not elaborate the event. She states that she is not taking sertraline ("No, I don't,") although she is unable to tell the reason.  She states that her teeth are "so yellow." She wishes to spend money on dental procedure rather than having this appointment.  She reports insomnia.  She feels down at times.  She has mild anxiety.  She denies SI, HI, AH, VH. When she is asked about orientation, she answers "2023.don't know, July, Thursday." When she  is asked about the date, she states that "how do you thin kit is." After the interview is ended, she states that "this was a 15 mins of shxx" and left.    (After the visit, this Probation officer independently talks with her son, Elizabeth [name]) He states that she used to be independent; she can cook, make a phone call, washes and bathes.  Although she was very capable, things has gotten worse over the past several months due to depression.  She lost her dog, her daughter moving away to [place] had a mastectomy, her friend/roommate moved out,  she lost her friends.  These occurred over the past several months. She was in special education classes, and Elizabeth [name] wonders she may have some intellectual disability. She never lived by herself until Nov due to her roommate moving out. Elizabeth [name] is a representative of payee as she recited depressive rate on finances for the past several years. Her daughter is HCPOA. Elizabeth [name] signed her for daycare twice a week, and program at cancer center. He is trying to encourage these activities. He agrees to first work on medication adherence, then slow uptitration of medication to avoid any adverse reaction.   Wt Readings from Last 3 Encounters:  08/02/21 108 lb (49 kg)  07/31/21 108 lb 7 oz (49.2 kg)  07/18/21 111 lb 8.8 oz (50.6 kg)     Visit Diagnosis:    ICD-10-CM   1. Bipolar affective disorder, current episode depressed, current episode severity unspecified ([place])  F31.30       Past Psychiatric History: Please see initial evaluation for full details. I have reviewed the history. No updates at this time.     Past Medical History:  Past Medical History:  Diagnosis Date   Acute cholecystitis 2016   Anemia    Anemia 2016   Anxiety    panic attacks   Anxiety and depression    Bipolar 1 disorder (HCC)    Bowel obstruction ([place]) 2017   Common bile duct dilatation 2015   Dyspnea    Edema    Fibromyalgia    GI bleed 2017   History of kidney stones 01/21/2009   HSV-1 infection    Hyperglycemia    Hyperlipidemia    Hypokalemia    Hypothyroidism    Liver cyst    Malignant neoplasm of right female breast, unspecified estrogen receptor status, unspecified site of breast ([place])    Memory loss    Osteopenia    Panic disorder    Schizophrenia ([place])    Ventral hernia     Past Surgical History:  Procedure Laterality Date   ABDOMINAL HYSTERECTOMY     BREAST BIOPSY Right 01/19/2021   [place] Bx, ribbon, path pending   BREAST BIOPSY Right 01/19/2021   [place] Bx, Venus, path pending   BREAST BIOPSY Right  01/19/2021   [place] Bx, heart, path pending   BREAST BIOPSY Right 01/19/2021   [place] BX Axilla, Coil, path pending   CESAREAN SECTION     CHOLECYSTECTOMY OPEN  11/17/2013   with repair of bile duct-Dr. Marina Gravel   MASTECTOMY MODIFIED RADICAL Right 03/02/2021   Procedure: MASTECTOMY MODIFIED RADICAL;  Surgeon: Benjamine Sprague, DO;  Location: [facility] ORS;  Service: General;  Laterality: Right;   OPEN REDUCTION INTERNAL FIXATION (ORIF) DISTAL RADIAL FRACTURE Right 12/20/2014   Procedure: OPEN REDUCTION INTERNAL FIXATION (ORIF) RIGHT DISTAL RADIAL FRACTURE;  Surgeon: Renette Butters, MD;  Location: [facility];  Service: Orthopedics;  Laterality: Right;   STOMACH  SURGERY      Family Psychiatric History: Please see initial evaluation for full details. I have reviewed the history. No updates at this time.     Family History:  Family History  Problem Relation Age of Onset   Heart disease Father    Breast cancer Paternal Aunt    Kidney disease Paternal 25    Breast cancer Paternal Grandmother    Asthma Daughter    Bladder Cancer Neg Hx     Social History:  Social History   Socioeconomic History   Marital status: Married    Spouse name: Not on file   Number of children: 1   Years of education: Not on file   Highest education level: Not on file  Occupational History   Not on file  Tobacco Use   Smoking status: Former    Packs/day: 0.00    Years: 0.00    Total pack years: 0.00    Types: Cigarettes    Quit date: 11/03/1973    Years since quitting: 47.7    Passive exposure: Past   Smokeless tobacco: Never   Tobacco comments:    Smoked briefly as a teen- lives in secondhand smoke.   Vaping Use   Vaping Use: Never used  Substance and Sexual Activity   Alcohol use: No    Alcohol/week: 0.0 standard drinks of alcohol   Drug use: No   Sexual activity: Not Currently  Other Topics Concern   Not on file  Social History Narrative   Not on file   Social Determinants of Health    Financial Resource Strain: Not on file  Food Insecurity: Not on file  Transportation Needs: Not on file  Physical Activity: Not on file  Stress: Not on file  Social Connections: Not on file    Allergies:  Allergies  Allergen Reactions   Penicillins Hives    TOLERATED CEFAZOLIN    Metabolic Disorder Labs: No results found for: "HGBA1C", "MPG" No results found for: "PROLACTIN" No results found for: "CHOL", "TRIG", "HDL", "CHOLHDL", "VLDL", "LDLCALC" No results found for: "TSH"  Therapeutic Level Labs: No results found for: "LITHIUM" Lab Results  Component Value Date   VALPROATE <10 (L) 07/18/2021   No results found for: "CBMZ"  Current Medications: Current Outpatient Medications  Medication Sig Dispense Refill   alendronate (FOSAMAX) 70 MG tablet Take 1 tablet by mouth every Monday.     letrozole (FEMARA) 2.5 MG tablet Take 1 tablet (2.5 mg total) by mouth daily. 90 tablet 3   levothyroxine (SYNTHROID, LEVOTHROID) 88 MCG tablet Take 88 mcg by mouth daily before breakfast.     QUEtiapine (SEROQUEL) 25 MG tablet Take 1 tablet (25 mg total) by mouth at bedtime. 30 tablet 0   sertraline (ZOLOFT) 25 MG tablet Take 25 mg by mouth daily.     Vitamin D, Ergocalciferol, (DRISDOL) 1.25 MG (50000 UNIT) CAPS capsule Take 50,000 Units by mouth once a week.     No current facility-administered medications for this visit.     Musculoskeletal: Strength & Muscle Tone: within normal limits Gait & Station: normal Patient leans: N/A  Psychiatric Specialty Exam: Review of Systems  Psychiatric/Behavioral:  Positive for decreased concentration, dysphoric mood and sleep disturbance. Negative for agitation, behavioral problems, confusion, hallucinations, self-injury and suicidal ideas. The patient is nervous/anxious. The patient is not hyperactive.   All other systems reviewed and are negative.   Blood pressure 107/69, pulse (!) 112, temperature 98.5 F (36.9 C), temperature source  Temporal, weight  108 lb (49 kg).Body mass index is 19.91 kg/m.  General Appearance: Fairly Groomed  Eye Contact:  Minimal  Speech:  Clear and Coherent  Volume:  Normal  Mood:   fine  Affect:   irritable  Thought Process:  Disorganized  Orientation:  Full (Time, Place, and Person)  Thought Content: Rumination   Suicidal Thoughts:  No  Homicidal Thoughts:  No  Memory:  Immediate;   Fair  Judgement:  Impaired  Insight:  Lacking  Psychomotor Activity:  Normal  Concentration:  Concentration: Fair and Attention Span: Fair  Recall:  Poor  Fund of Knowledge: NA  Language: Good  Akathisia:  No  Handed:  Right  AIMS (if indicated): not done  Assets:  Social Support  ADL's:  Intact  Cognition: WNL  Sleep:  Poor   Screenings: PHQ2-9    Flowsheet Row Office Visit from 07/03/2021 in [place]  PHQ-2 Total Score 5  PHQ-9 Total Score 20      [facility] ED from 07/18/2021 in [place] Office Visit from 07/03/2021 in [facility] ED from 04/21/2021 in [facility] CATEGORY High Risk Error: Q3, 4, or 5 should not be populated when Q2 is No High Risk        Assessment and Plan:  GISSELA BLOCH is a 71 y.o. 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   1. Bipolar affective disorder, current episode depressed, current episode severity unspecified ([place]) R/o MDD Unstable. Exam is notable for disorganization, and perseveration on her dental issues/concern about her payment.  Differentials includes depression, bipolar disorder (has this diagnosis according to her son, although she has no known manic symptoms), and/or early manifestation of  neuropsychiatric symptoms secondary to underlying neurocognitive disorder.  According to her son, she was doing better when she was in the hospital, and wonders if that might be related to medication adherence.  He denies any safety concern of her being back home. He agrees to ensure medication adherence. Will continue sertraline to target depression and anxiety.  Will continue quetiapine for mood dysregulation at this time with plan to uptitrate in the future as needed.  Will discontinue Depakote at this time to improve her medication adherence.  Noted that the blood VPA was <10, and it is less likely that this medication has had significant impact on her mood, although will consider restarting this medication if any worsening in her symptoms.  Will hold clonazepam at this time to avoid polypharmacy/paradoxical reaction.    R/o cognitive impairment Unchanged. She complains of memory loss, and she demonstrates illogical thought process.  No obvious physical signs consistent with parkinson symptoms. Will plan to evaluate her cognition more at the next visit. (3/3 on clock drawing)   Plan Continue sertraline 25 mg daily Continue quetiapine 25 mg at night  Discontinue depakote (was on 125 mg) Hold clonazepam (was on 0.25 mg) Next appointment: 8/3 at 9 AM for 40 mins, in person (she declined sooner visit due to conflict in her schedule). Her son agrees to come together - consent form to have 2-way conversation with her son, Elizabeth [name] is on file. -Discussed with her son to bring her to the hospital if any concern of continued decrease in appetite, any danger to self/others.  I have reviewed suicide assessment in detail. Updated as follows.    The patient demonstrates the following risk factors for suicide: Chronic risk factors for suicide include: psychiatric disorder of depression . Acute risk factors for suicide include: unemployment, social withdrawal/isolation, and recent suicide attempt/discharges from  inpatient psychiatry. Protective factors for this patient include: positive social support. Considering these factors, the overall suicide risk at this point appears to be moderate, but not at imminent risk. There are no guns in the house. Patient is appropriate for outpatient follow up.           Collaboration of Care: Collaboration of Care: Other communicated above plans with her son, reviewed notes from ED, [facility]  Patient/Guardian was advised Release of Information must be obtained prior to any record release in order to collaborate their care with an outside provider. Patient/Guardian was advised if they have not already done so to contact the registration department to sign all necessary forms in order for [place] to release information regarding their care.   Consent: Patient/Guardian gives verbal consent for treatment and assignment of benefits for services provided during this visit. Patient/Guardian expressed understanding and agreed to proceed.    The duration of this appointment visit was 40 minutes of face-to-face time with the patient.  Greater than 50% of this time was spent in counseling, explanation of  diagnosis, planning of further management, and coordination of care.   Norman Clay, MD 08/02/2021, 5:37 PM

## 2021-07-30 NOTE — Progress Notes (Deleted)
[place], sertraline 25 mg daily

## 2021-07-31 ENCOUNTER — Inpatient Hospital Stay: Payer: Medicare HMO

## 2021-07-31 NOTE — Progress Notes (Signed)
Nutrition Follow-up:   Patient with stage III right breast cancer.  Patient has refused chemotherapy and radiation therapy.  She is currently on letrozole.  Noted recent admission to inpatient psychiatric hospital and discharged on 7/6.    Patient comes to nutrition appointment alone today.  Says that she does not have much of an appetite.  Eating about 1-2 meals per day.  Says that she has been trying to eat more chicken for protein.  Son bought her some of the lactaid milk and she is drinking it about 1 time per day. Says that she has been snacking on nachos and cheese dip for bedtime snack.  Says that she ate well during recent admission.  Has been drinking oral nutrition supplements but willing to try them.     Labs: no new  Anthropometrics:   108 lb 8 oz today  111 lb 9 oz on 6/13 122 lb on 03/14/21 115 lb on 01/31/21    INTERVENTION:  Encouraged patient to try oral nutrition supplements and drink 1-2 times per day as a snack between meals.  Sample of ensure complete given Patient says that she can drink lactaid milk 3 times per day with meals Discussed ways to add calories and protein in diet.  Handout provided along with high calorie, high protein snacks.  Patient able to verbalize wanting to stay independent and eating well will help her reach her goal.      MONITORING, EVALUATION, GOAL: weight trends, intake   NEXT VISIT: Tuesday, August 22nd clinic visit  Cristianna Cyr [name], [place], Standard Registered Dietitian [phone]

## 2021-08-01 ENCOUNTER — Other Ambulatory Visit: Payer: Medicare HMO | Admitting: Licensed Clinical Social Worker

## 2021-08-02 ENCOUNTER — Encounter: Payer: Self-pay | Admitting: Psychiatry

## 2021-08-02 ENCOUNTER — Ambulatory Visit (INDEPENDENT_AMBULATORY_CARE_PROVIDER_SITE_OTHER): Payer: Medicare HMO | Admitting: Psychiatry

## 2021-08-02 ENCOUNTER — Other Ambulatory Visit: Payer: Self-pay

## 2021-08-02 VITALS — BP 107/69 | HR 112 | Temp 98.5°F | Wt 108.0 lb

## 2021-08-02 DIAGNOSIS — F3130 Bipolar disorder, current episode depressed, mild or moderate severity, unspecified: Secondary | ICD-10-CM | POA: Diagnosis not present

## 2021-08-02 MED ORDER — SERTRALINE HCL 25 MG PO TABS
25.0000 mg | ORAL_TABLET | Freq: Every day | ORAL | 0 refills | Status: DC
  Filled 2021-08-02: qty 30, 30d supply, fill #0

## 2021-08-02 MED ORDER — QUETIAPINE FUMARATE 25 MG PO TABS
25.0000 mg | ORAL_TABLET | Freq: Every day | ORAL | 0 refills | Status: DC
  Filled 2021-08-02: qty 30, 30d supply, fill #0

## 2021-08-07 ENCOUNTER — Encounter: Payer: Medicare HMO | Attending: Oncology

## 2021-08-07 DIAGNOSIS — I890 Lymphedema, not elsewhere classified: Secondary | ICD-10-CM | POA: Insufficient documentation

## 2021-08-07 DIAGNOSIS — C50911 Malignant neoplasm of unspecified site of right female breast: Secondary | ICD-10-CM | POA: Insufficient documentation

## 2021-08-07 NOTE — Progress Notes (Signed)
Daily Session Note  Patient Details  Name: MARGEE TRENTHAM MRN: [id number] Date of Birth: 1950/03/09 Referring Provider:   April Manson [facility] Associated Rehabilitation & Exercise from 06/12/2021 in [facility] Cardiac and Pulmonary Rehab  Referring Provider Delight Hoh MD       Encounter Date: 08/07/2021  Check In:  Session Check In - 08/07/21 1235       Check-In   Supervising physician immediately available to respond to emergencies See telemetry face sheet for immediately available ER MD    Location [facility]-Cardiac & Pulmonary Rehab    Staff Present Antionette Fairy, BS, Exercise Physiologist;Susanne Bice, RN, BSN, CCRP;Jessica [place], MA, RCEP, CCRP, [place], MS, ASCM CEP, Exercise Physiologist    Virtual Visit No    Medication changes reported     No    Fall or balance concerns reported    No    Tobacco Cessation No Change    Warm-up and Cool-down Performed on first and last piece of equipment    Resistance Training Performed Yes    VAD Patient? No      Pain Assessment   Currently in Pain? No/denies    Multiple Pain Sites No                Social History   Tobacco Use  Smoking Status Former   Packs/day: 0.00   Years: 0.00   Total pack years: 0.00   Types: Cigarettes   Quit date: 11/03/1973   Years since quitting: 47.7   Passive exposure: Past  Smokeless Tobacco Never  Tobacco Comments   Smoked briefly as a teen- lives in secondhand smoke.     Goals Met:  Independence with exercise equipment Exercise tolerated well No report of concerns or symptoms today Strength training completed today  Goals Unmet:  Not Applicable  Comments: Pt able to follow exercise prescription today without complaint.  Will continue to monitor for progression.    Dr. Emily Filbert is Medical Director for [place].  Dr. Ottie Glazier is Medical Director for [facility] Pulmonary Rehabilitation.

## 2021-08-09 ENCOUNTER — Encounter: Payer: Medicare HMO | Admitting: *Deleted

## 2021-08-09 DIAGNOSIS — C50911 Malignant neoplasm of unspecified site of right female breast: Secondary | ICD-10-CM

## 2021-08-09 NOTE — Progress Notes (Signed)
Daily Session Note  Patient Details  Name: Elizabeth [name] MRN: [id number] Date of Birth: May 16, 1950 Referring Provider:   April Manson Cancer Associated Rehabilitation & Exercise from 06/12/2021 in [facility] Cardiac and Pulmonary Rehab  Referring Provider Delight Hoh MD       Encounter Date: 08/09/2021  Check In:  Session Check In - 08/09/21 1223       Check-In   Supervising physician immediately available to respond to emergencies See telemetry face sheet for immediately available ER MD    Location [facility]-Cardiac & Pulmonary Rehab    Staff Present Alberteen Sam, MA, RCEP, CCRP, Marylynn Pearson, MS, ASCM CEP, Exercise Physiologist    Virtual Visit No    Medication changes reported     No    Fall or balance concerns reported    No    Tobacco Cessation No Change    Warm-up and Cool-down Performed on first and last piece of equipment    Resistance Training Performed Yes    VAD Patient? No    PAD/SET Patient? No      Pain Assessment   Currently in Pain? No/denies                Social History   Tobacco Use  Smoking Status Former   Packs/day: 0.00   Years: 0.00   Total pack years: 0.00   Types: Cigarettes   Quit date: 11/03/1973   Years since quitting: 47.7   Passive exposure: Past  Smokeless Tobacco Never  Tobacco Comments   Smoked briefly as a teen- lives in secondhand smoke.     Goals Met:  Proper associated with RPD/PD & O2 Sat Independence with exercise equipment Exercise tolerated well No report of concerns or symptoms today Strength training completed today  Goals Unmet:  Not Applicable  Comments: Pt able to follow exercise prescription today without complaint.  Will continue to monitor for progression.    Dr. Emily Filbert is Medical Director for [place].  Dr. Ottie Glazier is Medical Director for [facility] Pulmonary Rehabilitation.

## 2021-08-13 ENCOUNTER — Other Ambulatory Visit: Payer: Self-pay

## 2021-08-14 DIAGNOSIS — C50911 Malignant neoplasm of unspecified site of right female breast: Secondary | ICD-10-CM

## 2021-08-14 NOTE — Progress Notes (Signed)
Daily Session Note  Patient Details  Name: Elizabeth [name] MRN: [id number] Date of Birth: 12-13-1950 Referring Provider:   April Manson Cancer Associated Rehabilitation & Exercise from 06/12/2021 in [facility] Cardiac and Pulmonary Rehab  Referring Provider Delight Hoh MD       Encounter Date: 08/14/2021  Check In:  Session Check In - 08/14/21 1230       Check-In   Supervising physician immediately available to respond to emergencies See telemetry face sheet for immediately available ER MD    Location [facility]-Cardiac & Pulmonary Rehab    Staff Present Will Bonnet, RN,BC,MSN;Kara Eliezer Bottom, MS, ASCM CEP, Exercise Physiologist;Jessica Luan Pulling, MA, RCEP, CCRP, CCET    Virtual Visit No    Medication changes reported     No    Fall or balance concerns reported    No    Tobacco Cessation No Change    Warm-up and Cool-down Performed on first and last piece of equipment    Resistance Training Performed Yes    VAD Patient? No    PAD/SET Patient? No      Pain Assessment   Currently in Pain? No/denies    Multiple Pain Sites No                Social History   Tobacco Use  Smoking Status Former   Packs/day: 0.00   Years: 0.00   Total pack years: 0.00   Types: Cigarettes   Quit date: 11/03/1973   Years since quitting: 47.8   Passive exposure: Past  Smokeless Tobacco Never  Tobacco Comments   Smoked briefly as a teen- lives in secondhand smoke.     Goals Met:  Independence with exercise equipment Exercise tolerated well No report of concerns or symptoms today Strength training completed today  Goals Unmet:  Not Applicable  Comments: Pt able to follow exercise prescription today without complaint.  Will continue to monitor for progression.    Dr. Emily Filbert is Medical Director for [place].  Dr. Ottie Glazier is Medical Director for [facility] Pulmonary Rehabilitation.

## 2021-08-16 DIAGNOSIS — I890 Lymphedema, not elsewhere classified: Secondary | ICD-10-CM

## 2021-08-16 DIAGNOSIS — C50911 Malignant neoplasm of unspecified site of right female breast: Secondary | ICD-10-CM

## 2021-08-16 NOTE — Progress Notes (Signed)
Daily Session Note  Patient Details  Name: Elizabeth [name] MRN: [id number] Date of Birth: 1950-11-30 Referring Provider:   April [facility] from 06/12/2021 in [facility] Card[facility] Pul[facility]  Referring Provider Delight Hoh MD       Encounter Date: 08/16/2021  Check In:  Session Check In - 08/16/21 1253       Check-In   Supervising physician immediately available to respond to emergencies See telemetry face sheet for immediately available ER MD    Location [facility]-Cardiac & Pulmonary Rehab    Staff Present Coralie Keens, MS, ASCM CEP, Exercise Physiologist;Jessica Luan Pulling, MA, RCEP, CCRP, CCET    Virtual Visit No    Medication changes reported     No    Fall or balance concerns reported    No    Warm-up and Cool-down Performed on first and last piece of equipment    Resistance Training Performed Yes   yoga   VAD Patient? No                Social History   Tobacco Use  Smoking Status Former   Packs/day: 0.00   Years: 0.00   Total pack years: 0.00   Types: Cigarettes   Quit date: 11/03/1973   Years since quitting: 47.8   Passive exposure: Past  Smokeless Tobacco Never  Tobacco Comments   Smoked briefly as a teen- lives in secondhand smoke.     Goals Met:  Independence with exercise equipment Exercise tolerated well No report of concerns or symptoms today Strength training completed today  Goals Unmet:  Not Applicable  Comments: Pt able to follow exercise prescription today without complaint.  Will continue to monitor for progression.   Continuous weight loss has been noted since 6/1. Patient admitted to not eating much at home, was given crackers today in class. Patient has had an 8.2 lb weight loss since starting program. Would not be appropriate for patient to continue exercise until we see increase & stable weight. Dr. Grayland Ormond & Desmond Lope, RD both notified. Son notified patient will be discharged from program  until patient has better outcome and assistance with weight and eating.  1604: Called and spoke with patient to let her know we'd like to hold off on CARE until we can see better improvement with weight. Patient states she understood and is aware her weight change has been occurring. She did make a concerning comment about "not being here" but when asked about any concerns on SI or harming herself, she declined and stated "I was in the hospital for that and do not want to harm myself, I want to live longer." Reminded/educated patient on resources available for assistance with mental health. Informed patient once we see improvement in the weight she's welcome back to the CARE program to resume her sessions. Patient understood.   Dr. Emily Filbert is Medical Director for [place].  Dr. Ottie Glazier is Medical Director for [facility] Pulmonary Rehabilitation.

## 2021-08-22 NOTE — Progress Notes (Unsigned)
[place] MD/PA/NP OP Progress Note  08/23/2021 10:13 AM Elizabeth [name]  MRN:  [id number]  Chief Complaint:  Chief Complaint  Patient presents with   Follow-up   HPI:  This is a follow-up appointment for bipolar disorder.  She states that this Probation officer will not be paid for this visit as she is unable to pay co-pay (she talks about this repetitively through the visit). She states that she is unable to continue her exercise program at the [place] due to weight loss.  She continues to go to daycare.  She feels that neighbor is talking about her.  She feels that it is "me the one who is distant."  She feels burdened to others.  She thinks about the past, and he feels guilty.  Although she wants her son to be proud of her, she does not feel that way about herself.  She feels useless to anybody.  While discussing the medication, she reports concern of quetiapine make her "virgin."  She also states that she has dental issues, and she would not be able to eat due to this (dental appointment has been arranged by her son.)  She states that she needs to check HIV.  She does not like Depakote, and has agreed to discontinue this medication.  She has depressive symptoms as in PHQ-9 including initial insomnia.  She denies SI/HI.  She denies AH, VH.   Her son, Elizabeth [name] presents to the interview.  He still does not think she has been taking medication consistently.  She may take a few times per week.  APS has been involved, and decision was made that she needs in-home health.  She is currently going through evaluation, and the nurse comes to her place.  He thinks her appetite has been better; she used to eat 2 meals a day at the [facility].  She drinks Ensure regularly.  He agrees with the medication change as below.     Support: son Household: by herself Marital status: widow. Her husband died form COPD in 08-Apr-2005 Number of children: 1(2 children) Employment: works at [employer],  Education:  tenth grade (to work at [employer], was under drugs) Last PCP / ongoing medical evaluation:    Wt Readings from Last 3 Encounters:  08/23/21 110 lb (49.9 kg)  08/02/21 108 lb (49 kg)  07/31/21 108 lb 7 oz (49.2 kg)   07/18/21 111 lb 8.8 oz (50.6 kg)    Visit Diagnosis:    ICD-10-CM   1. Bipolar affective disorder, current episode depressed, current episode severity unspecified ([place])  F31.30     2. Insomnia, unspecified type  G47.00       Past Psychiatric History: Please see initial evaluation for full details. I have reviewed the history. No updates at this time.     Past Medical History:  Past Medical History:  Diagnosis Date   Acute cholecystitis 2014-04-09   Anemia    Anemia Apr 09, 2014   Anxiety    panic attacks   Anxiety and depression    Bipolar 1 disorder (HCC)    Bowel obstruction ([place]) April 09, 2015   Common bile duct dilatation 2013/04/08   Dyspnea    Edema    Fibromyalgia    GI bleed 04-09-15   History of kidney stones 01/21/2009   HSV-1 infection    Hyperglycemia    Hyperlipidemia    Hypokalemia    Hypothyroidism    Liver cyst    Malignant neoplasm of right female breast, unspecified estrogen receptor  status, unspecified site of breast ([place])    Memory loss    Osteopenia    Panic disorder    Schizophrenia ([place])    Ventral hernia     Past Surgical History:  Procedure Laterality Date   ABDOMINAL HYSTERECTOMY     BREAST BIOPSY Right 01/19/2021   [place] Bx, ribbon, path pending   BREAST BIOPSY Right 01/19/2021   [place] Bx, Venus, path pending   BREAST BIOPSY Right 01/19/2021   [place] Bx, heart, path pending   BREAST BIOPSY Right 01/19/2021   [place] BX Axilla, Coil, path pending   CESAREAN SECTION     CHOLECYSTECTOMY OPEN  11/17/2013   with repair of bile duct-Dr. Marina Gravel   MASTECTOMY MODIFIED RADICAL Right 03/02/2021   Procedure: MASTECTOMY MODIFIED RADICAL;  Surgeon: Benjamine Sprague, DO;  Location: [facility];  Service: General;  Laterality: Right;   OPEN REDUCTION INTERNAL FIXATION (ORIF) DISTAL RADIAL FRACTURE Right  12/20/2014   Procedure: OPEN REDUCTION INTERNAL FIXATION (ORIF) RIGHT DISTAL RADIAL FRACTURE;  Surgeon: Renette Butters, MD;  Location: [place];  Service: Orthopedics;  Laterality: Right;   STOMACH SURGERY      Family Psychiatric History: Please see initial evaluation for full details. I have reviewed the history. No updates at this time.     Family History:  Family History  Problem Relation Age of Onset   Heart disease Father    Breast cancer Paternal Aunt    Kidney disease Paternal 21    Breast cancer Paternal Grandmother    Asthma Daughter    Bladder Cancer Neg Hx     Social History:  Social History   Socioeconomic History   Marital status: Married    Spouse name: Not on file   Number of children: 1   Years of education: Not on file   Highest education level: Not on file  Occupational History   Not on file  Tobacco Use   Smoking status: Former    Packs/day: 0.00    Years: 0.00    Total pack years: 0.00    Types: Cigarettes    Quit date: 11/03/1973    Years since quitting: 47.8    Passive exposure: Past   Smokeless tobacco: Never   Tobacco comments:    Smoked briefly as a teen- lives in secondhand smoke.   Vaping Use   Vaping Use: Never used  Substance and Sexual Activity   Alcohol use: No    Alcohol/week: 0.0 standard drinks of alcohol   Drug use: No   Sexual activity: Not Currently  Other Topics Concern   Not on file  Social History Narrative   Not on file   Social Determinants of Health   Financial Resource Strain: Not on file  Food Insecurity: Not on file  Transportation Needs: Not on file  Physical Activity: Not on file  Stress: Not on file  Social Connections: Not on file    Allergies:  Allergies  Allergen Reactions   Penicillins Hives    TOLERATED CEFAZOLIN    Metabolic Disorder Labs: No results found for: "HGBA1C", "MPG" No results found for: "PROLACTIN" No results found for: "CHOL", "TRIG", "HDL", "CHOLHDL",  "VLDL", "LDLCALC" No results found for: "TSH"  Therapeutic Level Labs: No results found for: "LITHIUM" Lab Results  Component Value Date   VALPROATE <10 (L) 07/18/2021   No results found for: "CBMZ"  Current Medications: Current Outpatient Medications  Medication Sig Dispense Refill   alendronate (FOSAMAX) 70 MG tablet Take 1 tablet  by mouth every Monday.     ferrous sulfate 325 (65 FE) MG tablet Take 1 tablet by mouth daily with breakfast.     letrozole (FEMARA) 2.5 MG tablet Take 1 tablet (2.5 mg total) by mouth daily. 90 tablet 3   levothyroxine (SYNTHROID, LEVOTHROID) 88 MCG tablet Take 88 mcg by mouth daily before breakfast.     Vitamin D, Ergocalciferol, (DRISDOL) 1.25 MG (50000 UNIT) CAPS capsule Take 50,000 Units by mouth once a week.     QUEtiapine (SEROQUEL) 25 MG tablet Take 1 tablet (25 mg total) by mouth at bedtime. 30 tablet 1   sertraline (ZOLOFT) 25 MG tablet Take 1 tablet (25 mg total) by mouth daily. 30 tablet 1   No current facility-administered medications for this visit.     Musculoskeletal: Strength & Muscle Tone: within normal limits Gait & Station:  uses a walker Patient leans: N/A  Psychiatric Specialty Exam: Review of Systems  Psychiatric/Behavioral:  Positive for decreased concentration, dysphoric mood and sleep disturbance. Negative for agitation, behavioral problems, confusion, hallucinations, self-injury and suicidal ideas. The patient is nervous/anxious. The patient is not hyperactive.   All other systems reviewed and are negative.   Blood pressure 110/68, pulse (!) 103, temperature 97.9 F (36.6 C), temperature source Temporal, weight 110 lb (49.9 kg).Body mass index is 20.28 kg/m.  General Appearance: Fairly Groomed  Eye Contact:  Fair  Speech:  Clear and Coherent  Volume:  Normal  Mood:  Depressed  Affect:  Appropriate, Congruent, and Restricted  Thought Process:  Disorganized  Orientation:  Full (Time, Place, and Person)  Thought  Content: Paranoid Ideation   Suicidal Thoughts:  No  Homicidal Thoughts:  No  Memory:  Immediate;   Poor  Judgement:  Poor  Insight:  Shallow  Psychomotor Activity:  Normal  Concentration:  Concentration: Fair and Attention Span: Fair  Recall:  Good  Fund of Knowledge: Good  Language: Good  Akathisia:  No  Handed:  Right  AIMS (if indicated): not done  Assets:  Communication Skills Desire for Improvement  ADL's:  Intact  Cognition: WNL  Sleep:  Poor   Screenings: PHQ2-9    [place] Visit from 08/23/2021 in [place] Visit from 07/03/2021 in [place]  PHQ-2 Total Score 6 5  PHQ-9 Total Score 20 20      [place] Visit from 08/23/2021 in [place] ED from 07/18/2021 in [facility] Visit from 07/03/2021 in [place] Error: Q3, 4, or 5 should not be populated when Q2 is No High Risk Error: Q3, 4, or 5 should not be populated when Q2 is No        Assessment and Plan:  JESSINA MARSE is a 71 y.o. 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     1. Bipolar affective disorder, current episode depressed, current episode severity unspecified ([place]) # Insomnia R/o MDD Unstable.  Exam is notable for occasional disorganization, perseveration on her dental issues/medical concern, although she appears to be more redirectable compared to the previous visit. Differentials includes depression, bipolar disorder (has this diagnosis according to her son, although she has no known manic symptoms), and/or early manifestation of neuropsychiatric symptoms secondary to underlying neurocognitive  disorder.  According  to her son, she was doing better when she was in the hospital.  There are still concerns of medication adherence.  Will first ensure the adherence before adjustment in her medication.  Will continue sertraline to target depression.  Will continue quetiapine for mood dysregulation/paranoia.insomnia.  They are advised again to discontinue Depakote to avoid polypharmacy/ensure adherence for other medication.  Noted that the blood VPA was <10, and it is less likely that this medication has had significant impact on her mood, although will consider restarting this medication if any worsening in her symptoms.  It was discussed to consider ECT given concern of medication adherence, and concern of appetite loss, although it has improved some.  Provided a handout of the information to her son.  He was informed that referral will be made if they are to be interested.    R/o cognitive impairment Unchanged. She complains of memory loss, and she demonstrates illogical thought process. Differential diagnosis includes depression. There are no obvious physical signs consistent with parkinson symptoms. Will plan to evaluate her cognition more in the future visit. (3/3 on clock drawing)   Plan Continue sertraline 25 mg daily Continue quetiapine 25 mg at night  Discontinue Depakote (was on 125 mg) Next appointment: 9/11 at 1 PM, in person and 10/16 at 11 AM, in person.  Her son agrees to come together - consent form to have 2-way conversation with her son, Elizabeth [name] is on file. -Discussed with her son to bring her to the hospital if any concern of continued decrease in appetite, any danger to self/others.    I have reviewed suicide assessment in detail. No change in the following assessment.    The patient demonstrates the following risk factors for suicide: Chronic risk factors for suicide include: psychiatric disorder of depression . Acute risk factors for suicide include: unemployment, social  withdrawal/isolation, and recent suicide attempt/discharges from inpatient psychiatry. Protective factors for this patient include: positive social support. Considering these factors, the overall suicide risk at this point appears to be moderate, but not at imminent risk. There are no guns in the house. Patient is appropriate for outpatient follow up.       The duration of this appointment visit was 45 minutes of face-to-face time with the patient.  Greater than 50% of this time was spent in counseling, explanation of  diagnosis, planning of further management, and coordination of care.       Collaboration of Care: Collaboration of Care: Other discussing with her son  Patient/Guardian was advised Release of Information must be obtained prior to any record release in order to collaborate their care with an outside provider. Patient/Guardian was advised if they have not already done so to contact the registration department to sign all necessary forms in order for [place] to release information regarding their care.   Consent: Patient/Guardian gives verbal consent for treatment and assignment of benefits for services provided during this visit. Patient/Guardian expressed understanding and agreed to proceed.    Norman Clay, MD 08/23/2021, 10:13 AM

## 2021-08-23 ENCOUNTER — Encounter: Payer: Self-pay | Admitting: Psychiatry

## 2021-08-23 ENCOUNTER — Ambulatory Visit (INDEPENDENT_AMBULATORY_CARE_PROVIDER_SITE_OTHER): Payer: Medicare HMO | Admitting: Psychiatry

## 2021-08-23 ENCOUNTER — Telehealth: Payer: Self-pay

## 2021-08-23 VITALS — BP 110/68 | HR 103 | Temp 97.9°F | Wt 110.0 lb

## 2021-08-23 DIAGNOSIS — G4700 Insomnia, unspecified: Secondary | ICD-10-CM

## 2021-08-23 DIAGNOSIS — F3130 Bipolar disorder, current episode depressed, mild or moderate severity, unspecified: Secondary | ICD-10-CM | POA: Diagnosis not present

## 2021-08-23 MED ORDER — QUETIAPINE FUMARATE 25 MG PO TABS: 25.0000 mg | ORAL_TABLET | Freq: Every day | ORAL | 1 refills | Status: DC

## 2021-08-23 MED ORDER — SERTRALINE HCL 25 MG PO TABS: 25.0000 mg | ORAL_TABLET | Freq: Every day | ORAL | 1 refills | Status: DC

## 2021-08-23 NOTE — Patient Instructions (Signed)
Continue sertraline 25 mg daily Continue quetiapine 25 mg at night  Discontinue Depakote  Next appointment: 9/11 at 1 PM, in person and 10/16 at 11 AM, in person

## 2021-08-23 NOTE — Telephone Encounter (Addendum)
Nutrition  RD spoke with Dr Grayland Ormond and MD feels that weight loss is secondary to patient's psychiatric issues.    Patient would be best served by a Dietitian who specializes in disordered eating to provide ongoing support and counseling.  RD called and spoke with son [name] via phone.  Son agrees that weight loss is associated with her psychiatric issues.  He is agreeable to RD emailing options of Dietitians that specialize in disordered eating.    RD emailed the following options to son, [name]. 1) [facility], Isidore Moos [facility] only) 2) [facility]) 3) [facility] ([place]) 4) [facility])  Explained that Dr Grayland Ormond has agreed to send referral pending which option son and patient prefer.  Son will notify RD with decision. RD explained that August 22nd nutrition appointment will be cancelled at the cancer center.   Son verbalized understanding  Elizabeth [name] [name], [name], [name] Registered Dietitian [phone]

## 2021-08-29 ENCOUNTER — Telehealth: Payer: Self-pay

## 2021-08-29 NOTE — Telephone Encounter (Signed)
Patient called, specifically asking for myself, Marcene Brawn. She wanted to let me know her weight is up to 108 lb. She is not aware If she has been set up with a specialist yet but did reiterate to her that once we see her weight going back up and in the right direction that is stable, we can resume the CARE program with clearance. It looks like that is in the process and will follow up with the son. Patient is aware she is on hold for now.

## 2021-09-29 NOTE — Progress Notes (Unsigned)
Virtual Visit via Video Note  I connected with Elizabeth [name] on 10/01/21 at  1:00 PM EDT by a video enabled telemedicine application and verified that I am speaking with the correct person using two identifiers.  Location: Patient: home Provider: office Persons participated in the visit- patient, provider    I discussed the limitations of evaluation and management by telemedicine and the availability of in person appointments. The patient expressed understanding and agreed to proceed.    I discussed the assessment and treatment plan with the patient. The patient was provided an opportunity to ask questions and all were answered. The patient agreed with the plan and demonstrated an understanding of the instructions.   The patient was advised to call back or seek an in-person evaluation if the symptoms worsen or if the condition fails to improve as anticipated.  I provided 25 minutes of non-face-to-face time during this encounter.   Norman Clay, MD    [facility] MD/PA/NP OP Progress Note  10/01/2021 1:37 PM Elizabeth [name]  MRN:  [id number]  Chief Complaint:  Chief Complaint  Patient presents with   Follow-up   Other   HPI:  This is a follow-up appointment for mood disorder and insomnia.  She states that she feels depressed.  She has been "more difficult," although she does not elaborate it.  She stays in the chair most of the time except on weekend when she visits her friend's house or going to church.  She states that she was told by others that she is skinny.  Although she initially states that she does not have appetite, she later states that she chooses not to eat.  When she is asked about the reason, she states that people were out there without food, and she does not think weight loss is related to her choice of not eating.  She continues to feel that neighbor is talking about her.  She also feels that somebody is looking at her.  She answers "yes and no " when she is asked  about SI.  When she is asked to elaborate it, she states that she is biblical, and she is a "great whore" in the bible.  When she is asked about HI, she states that she wants a "complete physical (examination)."  She sleeps 2 hours. ("Can't sleep. Period.") When she is informed of sleep hygiene, she states that "nice ride will help me sleep." She denies [place]. She takes medication a few times per week. She sees a therapist, and reports it has been helpful, stating that she prefers this therapist to this Probation officer.  [name], her son presents to the visit.  He states that there has been no significant change since the last visit.  He thinks it is correct that she may take medication a few times per week.  She was approved of grant through [place], and he is hoping to get her in home health. Although he is aware of the SI comment as above, he denies significant concern. He thinks she say those things to get attention. He declined to get ECT referral, stating that he sees people with disability, who has done that treatment. Provided psychoeducation of pros and cons of ECT.   Support: son Household: by herself Marital status: widow. Her husband died form COPD in 2005/03/25 Number of children: 1(2 children) Employment: works at [employer],  Education:  tenth grade (to work at [employer], was under drugs) Last PCP / ongoing medical evaluation:   Visit Diagnosis:  ICD-10-CM   1. Bipolar affective disorder, current episode depressed, current episode severity unspecified ([place])  F31.30     2. Insomnia, unspecified type  G47.00       Past Psychiatric History: Please see initial evaluation for full details. I have reviewed the history. No updates at this time.     Past Medical History:  Past Medical History:  Diagnosis Date   Acute cholecystitis 2016   Anemia    Anemia 2016   Anxiety    panic attacks   Anxiety and depression    Bipolar 1 disorder (HCC)    Bowel obstruction ([place]) 2017   Common bile duct  dilatation 2015   Dyspnea    Edema    Fibromyalgia    GI bleed 2017   History of kidney stones 01/21/2009   HSV-1 infection    Hyperglycemia    Hyperlipidemia    Hypokalemia    Hypothyroidism    Liver cyst    Malignant neoplasm of right female breast, unspecified estrogen receptor status, unspecified site of breast ([place])    Memory loss    Osteopenia    Panic disorder    Schizophrenia ([place])    Ventral hernia     Past Surgical History:  Procedure Laterality Date   ABDOMINAL HYSTERECTOMY     BREAST BIOPSY Right 01/19/2021   [place] Bx, ribbon, path pending   BREAST BIOPSY Right 01/19/2021   [place] Bx, Venus, path pending   BREAST BIOPSY Right 01/19/2021   [place] Bx, heart, path pending   BREAST BIOPSY Right 01/19/2021   [place] BX Axilla, Coil, path pending   CESAREAN SECTION     CHOLECYSTECTOMY OPEN  11/17/2013   with repair of bile duct-Dr. Marina Gravel   MASTECTOMY MODIFIED RADICAL Right 03/02/2021   Procedure: MASTECTOMY MODIFIED RADICAL;  Surgeon: Benjamine Sprague, DO;  Location: [facility];  Service: General;  Laterality: Right;   OPEN REDUCTION INTERNAL FIXATION (ORIF) DISTAL RADIAL FRACTURE Right 12/20/2014   Procedure: OPEN REDUCTION INTERNAL FIXATION (ORIF) RIGHT DISTAL RADIAL FRACTURE;  Surgeon: Renette Butters, MD;  Location: [place];  Service: Orthopedics;  Laterality: Right;   STOMACH SURGERY      Family Psychiatric History: Please see initial evaluation for full details. I have reviewed the history. No updates at this time.     Family History:  Family History  Problem Relation Age of Onset   Heart disease Father    Breast cancer Paternal Aunt    Kidney disease Paternal 26    Breast cancer Paternal Grandmother    Asthma Daughter    Bladder Cancer Neg Hx     Social History:  Social History   Socioeconomic History   Marital status: Married    Spouse name: Not on file   Number of children: 1   Years of education: Not on file   Highest education level: Not on  file  Occupational History   Not on file  Tobacco Use   Smoking status: Former    Packs/day: 0.00    Years: 0.00    Total pack years: 0.00    Types: Cigarettes    Quit date: 11/03/1973    Years since quitting: 47.9    Passive exposure: Past   Smokeless tobacco: Never   Tobacco comments:    Smoked briefly as a teen- lives in secondhand smoke.   Vaping Use   Vaping Use: Never used  Substance and Sexual Activity   Alcohol use: No    Alcohol/week: 0.0  standard drinks of alcohol   Drug use: No   Sexual activity: Not Currently  Other Topics Concern   Not on file  Social History Narrative   Not on file   Social Determinants of Health   Financial Resource Strain: Not on file  Food Insecurity: Not on file  Transportation Needs: Not on file  Physical Activity: Not on file  Stress: Not on file  Social Connections: Not on file    Allergies:  Allergies  Allergen Reactions   Penicillins Hives    TOLERATED CEFAZOLIN    Metabolic Disorder Labs: No results found for: "HGBA1C", "MPG" No results found for: "PROLACTIN" No results found for: "CHOL", "TRIG", "HDL", "CHOLHDL", "VLDL", "LDLCALC" No results found for: "TSH"  Therapeutic Level Labs: No results found for: "LITHIUM" Lab Results  Component Value Date   VALPROATE <10 (L) 07/18/2021   No results found for: "CBMZ"  Current Medications: Current Outpatient Medications  Medication Sig Dispense Refill   alendronate (FOSAMAX) 70 MG tablet Take 1 tablet by mouth every Monday.     ferrous sulfate 325 (65 FE) MG tablet Take 1 tablet by mouth daily with breakfast.     letrozole (FEMARA) 2.5 MG tablet Take 1 tablet (2.5 mg total) by mouth daily. 90 tablet 3   levothyroxine (SYNTHROID, LEVOTHROID) 88 MCG tablet Take 88 mcg by mouth daily before breakfast.     [START ON 10/23/2021] QUEtiapine (SEROQUEL) 25 MG tablet Take 1 tablet (25 mg total) by mouth at bedtime. 30 tablet 0   [START ON 10/23/2021] sertraline (ZOLOFT) 25 MG  tablet Take 1 tablet (25 mg total) by mouth daily. 30 tablet 0   Vitamin D, Ergocalciferol, (DRISDOL) 1.25 MG (50000 UNIT) CAPS capsule Take 50,000 Units by mouth once a week.     No current facility-administered medications for this visit.     Musculoskeletal: Strength & Muscle Tone: within normal limits Gait & Station: normal Patient leans: N/A  Psychiatric Specialty Exam: Review of Systems  Psychiatric/Behavioral:  Positive for dysphoric mood, sleep disturbance and suicidal ideas. Negative for agitation, behavioral problems, confusion, decreased concentration, hallucinations and self-injury. The patient is nervous/anxious. The patient is not hyperactive.   All other systems reviewed and are negative.   There were no vitals taken for this visit.There is no height or weight on file to calculate BMI.  General Appearance: Fairly Groomed  Eye Contact:  Good  Speech:  Clear and Coherent  Volume:  Normal  Mood:  Depressed  Affect:  Appropriate, Congruent, and irritable  Thought Process:  Disorganized  Orientation:  Full (Time, Place, and Person)  Thought Content: Paranoid Ideation   Suicidal Thoughts:   "yes and no" no intent ,no plans  Homicidal Thoughts:  No  Memory:  Immediate;   Good  Judgement:  Impaired  Insight:  Shallow  Psychomotor Activity:  Normal  Concentration:  Concentration: Good and Attention Span: Good  Recall:  Good  Fund of Knowledge: Good  Language: Good  Akathisia:  No  Handed:  Right  AIMS (if indicated): not done  Assets:  Communication Skills Desire for Improvement  ADL's:  Intact  Cognition: WNL  Sleep:  Poor   Screenings: PHQ2-9    [place] Office Visit from 08/23/2021 in [place] Office Visit from 07/03/2021 in [place]  PHQ-2 Total Score 6 5  PHQ-9 Total Score 20 20      [place] Office Visit from 08/23/2021 in [place] ED from 07/18/2021  in [place] Office Visit from 07/03/2021 in [place] RISK CATEGORY Error: Q3, 4, or 5 should not be populated when Q2 is No High Risk Error: Q3, 4, or 5 should not be populated when Q2 is No        Assessment and Plan:  CELENIA HRUSKA is a 71 y.o. 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   1. Bipolar affective disorder, current episode depressed, current episode severity unspecified ([place]) 2. Insomnia, unspecified type R/o MDD Unstable.  Exam is notable for disorganization, irritability, although she is relatively redirectable. Differentials includes depression, bipolar disorder (has this diagnosis according to her son, although she has no known manic symptoms), and/or early manifestation of neuropsychiatric symptoms secondary to underlying neurocognitive disorder.  According to her son, she was doing better when she was in the hospital.  There is concern of medication adherence.  According to her son, she was approved of the grant through the [place], and hope to get in home health.  Will continue current dose of sertraline to target depression.  Will continue quetiapine for mood dysregulation/paranoia and insomnia.  Although it was discussed to make referral to ECT given concern of medication adherence, and appetite loss, both the patient and her son declines this.   R/o cognitive impairment Unchanged.  She complains of memory loss, and she demonstrates illogical thought process. Differential diagnosis includes depression. There are no obvious physical signs consistent with parkinson symptoms. Will plan to evaluate her cognition more in the future visit. (3/3 on clock drawing)   Plan Continue sertraline 25 mg daily Continue  quetiapine 25 mg at night  Next appointment: 10/16 at 11 AM, in person.  Her son agrees to come together - she sees a therapist, Tiffany summers LCSW, weekly at [facility],  - consent form to have 2-way conversation with her son, [name] is on file. -Discussed with her son to bring her to the hospital if any concern of continued decrease in appetite, any danger to self/others.     I have reviewed suicide assessment in detail. No change in the following assessment.    The patient demonstrates the following risk factors for suicide: Chronic risk factors for suicide include: psychiatric disorder of depression . Acute risk factors for suicide include: unemployment, social withdrawal/isolation, and recent suicide attempt/discharges from inpatient psychiatry. Protective factors for this patient include: positive social support. Considering these factors, the overall suicide risk at this point appears to be moderate, but not at imminent risk. There are no guns in the house. Patient is appropriate for outpatient follow up.         Collaboration of Care: Collaboration of Care: Other reviewed notes in Epic  Patient/Guardian was advised Release of Information must be obtained prior to any record release in order to collaborate their care with an outside provider. Patient/Guardian was advised if they have not already done so to contact the registration department to sign all necessary forms in order for [place] to release information regarding their care.   Consent: Patient/Guardian gives verbal consent for treatment and assignment of benefits for services provided during this visit. Patient/Guardian expressed understanding and agreed to proceed.    Norman Clay, MD 10/01/2021, 1:37 PM

## 2021-10-01 ENCOUNTER — Encounter: Payer: Self-pay | Admitting: Psychiatry

## 2021-10-01 ENCOUNTER — Telehealth (INDEPENDENT_AMBULATORY_CARE_PROVIDER_SITE_OTHER): Payer: Medicare HMO | Admitting: Psychiatry

## 2021-10-01 DIAGNOSIS — G4700 Insomnia, unspecified: Secondary | ICD-10-CM | POA: Diagnosis not present

## 2021-10-01 DIAGNOSIS — F3130 Bipolar disorder, current episode depressed, mild or moderate severity, unspecified: Secondary | ICD-10-CM | POA: Diagnosis not present

## 2021-10-01 MED ORDER — SERTRALINE HCL 25 MG PO TABS: 25.0000 mg | ORAL_TABLET | Freq: Every day | ORAL | 0 refills | Status: DC

## 2021-10-01 MED ORDER — QUETIAPINE FUMARATE 25 MG PO TABS: 25.0000 mg | ORAL_TABLET | Freq: Every day | ORAL | 0 refills | Status: DC

## 2021-10-02 ENCOUNTER — Encounter: Payer: Self-pay | Admitting: Oncology

## 2021-10-10 ENCOUNTER — Encounter: Payer: Self-pay | Admitting: Occupational Therapy

## 2021-10-15 ENCOUNTER — Ambulatory Visit: Payer: Medicare HMO | Attending: Oncology | Admitting: Occupational Therapy

## 2021-10-15 DIAGNOSIS — L905 Scar conditions and fibrosis of skin: Secondary | ICD-10-CM | POA: Insufficient documentation

## 2021-10-15 DIAGNOSIS — I972 Postmastectomy lymphedema syndrome: Secondary | ICD-10-CM | POA: Diagnosis present

## 2021-10-18 ENCOUNTER — Encounter: Payer: Self-pay | Admitting: Occupational Therapy

## 2021-10-18 NOTE — Therapy (Signed)
[place] [facility] [address], [place], [address] Phone: [phone]   Fax:  [phone]  Occupational Therapy Treatment  Patient Details  Name: Elizabeth [name] MRN: [id number] Date of Birth: 12-22-1950 Referring Provider (OT): Dr Grayland Ormond   Encounter Date: 10/15/2021   OT End of Session - 10/17/21 1505     Visit Number 6    Number of Visits 18    Date for OT Re-Evaluation 11/26/21    OT Start Time 2482    OT Stop Time 1600    OT Time Calculation (min) 45 min    Activity Tolerance Patient tolerated treatment well    Behavior During Therapy [facility] for tasks assessed/performed             Past Medical History:  Diagnosis Date   Acute cholecystitis 2016   Anemia    Anemia 2016   Anxiety    panic attacks   Anxiety and depression    Bipolar 1 disorder (HCC)    Bowel obstruction ([place]) 2017   Common bile duct dilatation 2015   Dyspnea    Edema    Fibromyalgia    GI bleed 2017   History of kidney stones 01/21/2009   HSV-1 infection    Hyperglycemia    Hyperlipidemia    Hypokalemia    Hypothyroidism    Liver cyst    Malignant neoplasm of right female breast, unspecified estrogen receptor status, unspecified site of breast ([place])    Memory loss    Osteopenia    Panic disorder    Schizophrenia ([place])    Ventral hernia     Past Surgical History:  Procedure Laterality Date   ABDOMINAL HYSTERECTOMY     BREAST BIOPSY Right 01/19/2021   [place] Bx, ribbon, path pending   BREAST BIOPSY Right 01/19/2021   [place] Bx, Venus, path pending   BREAST BIOPSY Right 01/19/2021   [place] Bx, heart, path pending   BREAST BIOPSY Right 01/19/2021   [place] BX Axilla, Coil, path pending   CESAREAN SECTION     CHOLECYSTECTOMY OPEN  11/17/2013   with repair of bile duct-Dr. Marina Gravel   MASTECTOMY MODIFIED RADICAL Right 03/02/2021   Procedure: MASTECTOMY MODIFIED RADICAL;  Surgeon: Benjamine Sprague, DO;  Location: [facility] ORS;  Service:  General;  Laterality: Right;   OPEN REDUCTION INTERNAL FIXATION (ORIF) DISTAL RADIAL FRACTURE Right 12/20/2014   Procedure: OPEN REDUCTION INTERNAL FIXATION (ORIF) RIGHT DISTAL RADIAL FRACTURE;  Surgeon: Renette Butters, MD;  Location: [place];  Service: Orthopedics;  Laterality: Right;   STOMACH SURGERY      There were no vitals filed for this visit.   Subjective Assessment - 10/17/21 1502     Subjective  Pt reports she thinks she lost her day sleeve, states she is still pumping most days.  Pt states, "I do not want to be bandaged, I'd rather go to the dentist than do that again."  Caregiver present and reports she has not seen pt wear compression garment for daytime.    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     PLAN:       1. 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  She underwent full mastectomy on  December 30, 2021.  Despite recommendation of adjuvant Adriamycin and Cytoxan followed by Taxol, patient continues to be adamant that she will not undergo chemotherapy.  She has highly hesitant to pursue adjuvant XRT as well.  Given her ER/PR status of her tumor, it was recommended patient also take letrozole which she is considering.  No intervention is needed at this time.  Patient will follow-up in 3 weeks for further evaluation and additional treatment planning.   2. Postoperative pain: Patient was given a prescription for hydrocodone today.  3. Lymphedema: Patient was given a referral to lymphedema clinic as well has an appointment with occupational therapy.    Patient Stated Goals To get my right arm smaller and prevent infection    Currently in Pain? No/denies    Multiple Pain Sites No                  LYMPHEDEMA/ONCOLOGY QUESTIONNAIRE - 10/18/21 0001       Right Upper Extremity Lymphedema   15 cm Proximal to Olecranon Process 26.1 cm    10 cm Proximal to Olecranon Process 25 cm    Olecranon Process 28 cm    15 cm Proximal to Ulnar Styloid Process 25.7 cm    10 cm Proximal to Ulnar Styloid Process 22.1 cm    Just Proximal to Ulnar Styloid Process 17.4 cm    Across Hand at [employer] 18.6 cm             Pt seen for circumferential measurements of right UE with comparison to last session.  Pt was previously fitted for a new daytime compression sleeve in June but she reports she has not been wearing it.  She initially thought she lost it but by the end of the session she reports she may have it in a drawer at home.  She reports she does not want to go back to bandaging.  We discussed the increases in her arm over the past few months and how this places her at an increased risk for cellulitis and infections.  She reports she still pumps 2 times a day most days.  She inquired if she could just get fitted for a new sleeve and we discussed how this would only contain her at the larger size she is at as long as she is compliant with wear.  Therapist recommends decongestion of the arm to get her back to the measurements from months ago to fit back into the new garment for daytime she received.    Pt to go home and find garment and bandaging supplies, continue to pump over the next few days, wear tubigrip at night for light compression and return next week for potential bandaging.  She is unsure if she will have any assistance with bandaging.  Her caregiver reports she is not allowed to perform medical tasks.                    OT Education - 10/18/21 1504     Education Details Reviewed with patient HEP, use of daytime compression, pump and need for compression bandaging.    Person(s) Educated Patient    Methods Explanation;Demonstration;Verbal  cues;Handout;Tactile cues    Comprehension Verbal cues required;Tactile cues required;Returned demonstration;Verbalized understanding                 OT Long Term Goals - 10/18/21 1515       OT LONG TERM GOAL #1   Title Patient to be independent  in home program to tolerate and wearing of compression bandaging to decrease circumference in the right upper extremity to get measured for compression garments    Baseline Patient had never done bandaging before and showing right upper extremity and thoracic lymphedema.  Upper arm increased by 3 cm and elbow and forearm 3 to 4 cm and wrist 2.5 cm.  10/15/2021:  Pt has not been wearing daytime sleeve for a while, increase in measurements.    Time 2    Period Weeks    Status On-going    Target Date 10/29/21      OT LONG TERM GOAL #2   Title Patient to be independent and wearing of compression garment to decrease and maintain right upper extremity circumference.    Baseline Patient increased by 3 to 4 cm in right upper extremity compared to left.  Has no pump or compression done in the past.  No knowledge.  10/15/21:  reassessment of measurements, increases since last session and has not worn sleeve.    Time 6    Period Weeks    Status On-going    Target Date 07/05/21      OT LONG TERM GOAL #3   Title Patient to be independent in use of pneumatic compression pump to supplement daytime compression to maintain circumference and decrease future infection in right upper extremity    Baseline Assess if patient can be fitted with a pneumatic pump has no knowledge would not be able to tolerate a nighttime garment because of mental health would recommend the pump to use morning and evening. 10/15/2021: using pump consistently    Time 6    Period Weeks    Status Partially Met    Target Date 07/05/21                   Plan - 10/18/21 1507     Clinical Impression Statement Patient  referred by Dr. Grayland Ormond for right upper extremity  lymphedema.  Patient is about 10 weeks postop right mastectomy with 11-13 lymph nodes removed.  Patient report swelling started about 5 weeks ago in the right breast and arm.  Upon assessment at eval it appears swelling  started little bit earlier coming and going but staying more in the arm than chest..  Patient's right upper extremity was at eval increase 2-4 cm. Pt was bandage for about week and decrease  to 1.3 -2.5 cm compare to L. Patient is right-hand dominant and reports doing laundry and some cleaning around the house but not cooking.  She walks some per patient.  Patient has a caregiver who brought her to her appt.  She also has a pump that she uses in the morning in the evening for about 30 to 40 minutes.  Pt stated she thought she lost her daytime compression sleeve but by end of session she reports it may be in the drawer at home.  Discussed significant increases since last visit and the recommendation for compression bandaging to decompress the arm in order to fit back into sleeve she just got a few months ago. Pt reports she does not want to bandage but will think about it over the next few days.  Requested patient bring in her daytime sleeve, pt to wear tubigrip at night, and pump at least 2 times a day, will come back in for reassessment next week.    Patient can benefit from skilled OT services to decongest right upper extremity and thoracic unable to keep lymphedema  under control with appropriate compression garments and a home program- to prevent future cellulitis issues.    OT Occupational Profile and History Problem Focused Assessment - Including review of records relating to presenting problem    Occupational performance deficits (Please refer to evaluation for details): ADL's;IADL's;Leisure;Play    Body Structure / Function / Physical Skills ADL;Decreased knowledge of precautions;Flexibility;Scar mobility;IADL;Edema    Rehab Potential Good    Clinical Decision Making Limited treatment  options, no task modification necessary    Comorbidities Affecting Occupational Performance: None    Modification or Assistance to Complete Evaluation  No modification of tasks or assist necessary to complete eval    OT Frequency 2x / week    OT Duration 6 weeks    OT Treatment/Interventions Self-care/ADL training;Manual lymph drainage;DME and/or AE instruction;Compression bandaging;Scar mobilization;Manual Therapy;Patient/family education    Consulted and Agree with Plan of Care Patient             Patient will benefit from skilled therapeutic intervention in order to improve the following deficits and impairments:   Body Structure / Function / Physical Skills: ADL, Decreased knowledge of precautions, Flexibility, Scar mobility, IADL, Edema       Visit Diagnosis: Postmastectomy lymphedema syndrome  Scar condition and fibrosis of skin    Problem List Patient Active Problem List   Diagnosis Date Noted   Major depressive disorder, recurrent severe without psychotic features ([place]) 04/21/2021   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[place]) 01/28/2021   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   Medical non-compliance 08/17/2020   Hepatic abscess 01/10/2016   History of diabetes mellitus, type II 11/04/2014   History of essential hypertension 11/04/2014   Intra-abdominal and pelvic swelling, mass and lump, unspecified site 11/03/2013   Lorene Klimas T Nazaire Cordial, OTR/L, CLT  Brystol Wasilewski, OT 10/18/2021, 3:32 PM  [place] [facility] [address], [place], [address] Phone: [phone]   Fax:  [phone]  Name: Elizabeth [name] MRN: [id number] Date of Birth: 08/06/1950

## 2021-10-22 ENCOUNTER — Ambulatory Visit: Payer: Medicare HMO | Attending: Oncology | Admitting: Occupational Therapy

## 2021-10-22 DIAGNOSIS — I972 Postmastectomy lymphedema syndrome: Secondary | ICD-10-CM | POA: Insufficient documentation

## 2021-10-22 DIAGNOSIS — L905 Scar conditions and fibrosis of skin: Secondary | ICD-10-CM | POA: Insufficient documentation

## 2021-10-22 NOTE — Therapy (Signed)
[place] [facility] [address], [place], [address] Phone: [phone]   Fax:  [phone]  Occupational Therapy Treatment  Patient Details  Name: Elizabeth [name] MRN: [id number] Date of Birth: May 11, 1950 Referring Provider (OT): Dr Grayland Ormond   Encounter Date: 10/22/2021   OT End of Session - 10/22/21 1705     Visit Number 7    Number of Visits 18    Date for OT Re-Evaluation 11/26/21    OT Start Time 1520    OT Stop Time 1600    OT Time Calculation (min) 40 min    Activity Tolerance Patient tolerated treatment well    Behavior During Therapy [facility] for tasks assessed/performed             Past Medical History:  Diagnosis Date   Acute cholecystitis 2016   Anemia    Anemia 2016   Anxiety    panic attacks   Anxiety and depression    Bipolar 1 disorder (HCC)    Bowel obstruction ([place]) 2017   Common bile duct dilatation 2015   Dyspnea    Edema    Fibromyalgia    GI bleed 2017   History of kidney stones 01/21/2009   HSV-1 infection    Hyperglycemia    Hyperlipidemia    Hypokalemia    Hypothyroidism    Liver cyst    Malignant neoplasm of right female breast, unspecified estrogen receptor status, unspecified site of breast ([place])    Memory loss    Osteopenia    Panic disorder    Schizophrenia ([place])    Ventral hernia     Past Surgical History:  Procedure Laterality Date   ABDOMINAL HYSTERECTOMY     BREAST BIOPSY Right 01/19/2021   [place] Bx, ribbon, path pending   BREAST BIOPSY Right 01/19/2021   [place] Bx, Venus, path pending   BREAST BIOPSY Right 01/19/2021   [place] Bx, heart, path pending   BREAST BIOPSY Right 01/19/2021   [place] BX Axilla, Coil, path pending   CESAREAN SECTION     CHOLECYSTECTOMY OPEN  11/17/2013   with repair of bile duct-Dr. Marina Gravel   MASTECTOMY MODIFIED RADICAL Right 03/02/2021   Procedure: MASTECTOMY MODIFIED RADICAL;  Surgeon: Benjamine Sprague, DO;  Location: [facility]S;  Service:  General;  Laterality: Right;   OPEN REDUCTION INTERNAL FIXATION (ORIF) DISTAL RADIAL FRACTURE Right 12/20/2014   Procedure: OPEN REDUCTION INTERNAL FIXATION (ORIF) RIGHT DISTAL RADIAL FRACTURE;  Surgeon: Renette Butters, MD;  Location: [place];  Service: Orthopedics;  Laterality: Right;   STOMACH SURGERY      There were no vitals filed for this visit.   Subjective Assessment - 10/22/21 1703     Subjective  I did use the pump about 2 x since I have been here. I did wear these white ones and glove. I do not want to be bandage. 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     PLAN:       1. 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  She underwent full mastectomy on December 30, 2021.  Despite recommendation of adjuvant Adriamycin and Cytoxan followed  by Taxol, patient continues to be adamant that she will not undergo chemotherapy.  She has highly hesitant to pursue adjuvant XRT as well.  Given her ER/PR status of her tumor, it was recommended patient also take letrozole which she is considering.  No intervention is needed at this time.  Patient will follow-up in 3 weeks for further evaluation and additional treatment planning.   2. Postoperative pain: Patient was given a prescription for hydrocodone today.  3. Lymphedema: Patient was given a referral to lymphedema clinic as well has an appointment with occupational therapy.    Patient Stated Goals To get my right arm smaller and prevent infection    Currently in Pain? No/denies                 LYMPHEDEMA/ONCOLOGY QUESTIONNAIRE - 10/22/21 0001       Right Upper Extremity Lymphedema   15 cm Proximal  to Olecranon Process 30 cm    10 cm Proximal to Olecranon Process 29 cm    Olecranon Process 27 cm    15 cm Proximal to Ulnar Styloid Process 26 cm    10 cm Proximal to Ulnar Styloid Process 23.3 cm    Just Proximal to Ulnar Styloid Process 18.5 cm    Across Hand at [employer] 18 cm      Left Upper Extremity Lymphedema   15 cm Proximal to Olecranon Process 27 cm    10 cm Proximal to Olecranon Process 26 cm    Olecranon Process 24 cm    15 cm Proximal to Ulnar Styloid Process 21 cm    10 cm Proximal to Ulnar Styloid Process 16.5 cm    Just Proximal to Ulnar Styloid Process 13.5 cm    Across Hand at [employer] 16.5 cm                 Pt seen for circumferential measurements of right UE with comparison to May 23.  Pt was previously fitted for a new daytime compression sleeve in may  but she reports she has not been wearing it. She brought it in today.  She reports she does not want to go back to bandaging.  We discussed the increases in her arm over the past few months and how this places her at an increased risk for cellulitis and infections. She report she only pump since last week 2 x .  She report she did wear her glove some and tubigrip D and E combination that she used in the past.  Upon measurements pt greatly increase compare to May 23- and L UE actually decreased because of weight lost.  Therapist recommends decongestion of the arm to get her back to the measurements from months ago to fit back into her daytime garment she received.     Pt fitted with her glove and cut her new Tubigrip E from hand to axilla - and showed her doing one layer 6 cm short stretch from wrist , thru and over hand and back up circular 50% up to axilla.  Video  HEP for pt  to use her pump am and pm -and redo her compression in between - wear most all the time until next week. If R UE is not decongesting - plan to initiate bandaging by OT .   Her caregiver reports they are not allowed to  perform medical tasks.                  OT  Education - 10/22/21 1704     Education Details Reviewed with patient HEP, use of  pump 2 x day and light compression bandaging. for week -and will reasses    Person(s) Educated Patient;Caregiver(s)    Methods Explanation;Demonstration;Verbal cues;Handout;Tactile cues    Comprehension Verbal cues required;Tactile cues required;Returned demonstration;Verbalized understanding                 OT Long Term Goals - 10/18/21 1515       OT LONG TERM GOAL #1   Title Patient to be independent in home program to tolerate and wearing of compression bandaging to decrease circumference in the right upper extremity to get measured for compression garments    Baseline Patient had never done bandaging before and showing right upper extremity and thoracic lymphedema.  Upper arm increased by 3 cm and elbow and forearm 3 to 4 cm and wrist 2.5 cm.  10/15/2021:  Pt has not been wearing daytime sleeve for a while, increase in measurements.    Time 2    Period Weeks    Status On-going    Target Date 10/29/21      OT LONG TERM GOAL #2   Title Patient to be independent and wearing of compression garment to decrease and maintain right upper extremity circumference.    Baseline Patient increased by 3 to 4 cm in right upper extremity compared to left.  Has no pump or compression done in the past.  No knowledge.  10/15/21:  reassessment of measurements, increases since last session and has not worn sleeve.    Time 6    Period Weeks    Status On-going    Target Date 07/05/21      OT LONG TERM GOAL #3   Title Patient to be independent in use of pneumatic compression pump to supplement daytime compression to maintain circumference and decrease future infection in right upper extremity    Baseline Assess if patient can be fitted with a pneumatic pump has no knowledge would not be able to tolerate a nighttime garment because of mental health would recommend  the pump to use morning and evening. 10/15/2021: using pump consistently    Time 6    Period Weeks    Status Partially Met    Target Date 07/05/21                   Plan - 10/22/21 1706     Clinical Impression Statement Patient  referred by Dr. Grayland Ormond for right upper extremity lymphedema.  Patient is  s/p right mastectomy with 11-13 lymph nodes removed.  Pt was seen by this OT in May 23 for bandaging of R UE to decrease lymphedema and circumference in R UE - and fitted with daytime compression sleeve and glove as well as pump. Pt return last week with increase of circumference in R UE lymphedema.  Pt wore some of the tubigrip compression and glove since last week. Pt return this date with measurement taken in bilateral UE and compare to May 23. Pt did loose weight since then.  Pt R UE increase compare to May 23 by  3 cm in upper arm and elbow. Forearm  increase 5-6 cm , wrist 5 cm and hand 1.5 cm.  Discussed significant increases since May 23 with elbow to wrist the most. Pt again strongly stated she do not want to be bandage. This date reforce for pt her risk for infection in forearm - and HEP for her to  do this next week. IF not decrease circumference would need to bandate her R UE starting next week to decrease circumference for her to fit back into her compression sleeve. Pt to wear inbetween 2 session of pumping compression glove , tubi grip E from hand to axilla and 6 cm short stretch bandage from wrist , one time thru  and over hand and then circular up to axilla. Sleep and wear inbetween 2 pump session AM and PM.  Pt to return next week for reassessment next week.    Patient can benefit from skilled OT services to decongest right upper extremity to keep lymphedema under control with appropriate compression garments and a home program- to prevent future cellulitis issues.    OT Occupational Profile and History Problem Focused Assessment - Including review of records relating to presenting  problem    Occupational performance deficits (Please refer to evaluation for details): ADL's;IADL's;Leisure;Play    Body Structure / Function / Physical Skills ADL;Decreased knowledge of precautions;Flexibility;Scar mobility;IADL;Edema    Rehab Potential Good    Clinical Decision Making Limited treatment options, no task modification necessary    Comorbidities Affecting Occupational Performance: None    Modification or Assistance to Complete Evaluation  No modification of tasks or assist necessary to complete eval    OT Frequency 2x / week    OT Duration 6 weeks    OT Treatment/Interventions Self-care/ADL training;Manual lymph drainage;DME and/or AE instruction;Compression bandaging;Scar mobilization;Manual Therapy;Patient/family education    Consulted and Agree with Plan of Care Patient             Patient will benefit from skilled therapeutic intervention in order to improve the following deficits and impairments:   Body Structure / Function / Physical Skills: ADL, Decreased knowledge of precautions, Flexibility, Scar mobility, IADL, Edema       Visit Diagnosis: Postmastectomy lymphedema syndrome  Scar condition and fibrosis of skin    Problem List Patient Active Problem List   Diagnosis Date Noted   Major depressive disorder, recurrent severe without psychotic features ([place]) 04/21/2021   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[place]) 01/28/2021   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   Medical non-compliance 08/17/2020   Hepatic abscess 01/10/2016   History of diabetes mellitus, type II 11/04/2014   History of essential hypertension 11/04/2014   Intra-abdominal and pelvic swelling, mass and lump, unspecified site 11/03/2013    Rosalyn Gess, OTR/L,CLT 10/22/2021, 5:16 PM  [facility] [address], [place], [address] Phone: [phone]   Fax:  [phone]  Name: CLOA BUSHONG MRN:  [id number] Date of Birth: [birth year]

## 2021-10-29 ENCOUNTER — Ambulatory Visit: Payer: Medicare HMO | Admitting: Occupational Therapy

## 2021-10-29 DIAGNOSIS — L905 Scar conditions and fibrosis of skin: Secondary | ICD-10-CM

## 2021-10-29 DIAGNOSIS — I972 Postmastectomy lymphedema syndrome: Secondary | ICD-10-CM

## 2021-10-29 NOTE — Therapy (Signed)
[facility] [address], [place], [address] Phone: [phone]   Fax:  [phone]  Occupational Therapy Treatment  Patient Details  Name: Elizabeth [name] MRN: [id number] Date of Birth: 1950/11/21 Referring Provider (OT): Dr Grayland Ormond   Encounter Date: 10/29/2021   OT End of Session - 10/29/21 1641     Visit Number 8    Number of Visits 18    Date for OT Re-Evaluation 11/26/21    OT Start Time 1275    OT Stop Time 1630    OT Time Calculation (min) 39 min    Activity Tolerance Patient tolerated treatment well    Behavior During Therapy [facility] for tasks assessed/performed             Past Medical History:  Diagnosis Date   Acute cholecystitis 2016   Anemia    Anemia 2016   Anxiety    panic attacks   Anxiety and depression    Bipolar 1 disorder (HCC)    Bowel obstruction ([place]) 2017   Common bile duct dilatation 2015   Dyspnea    Edema    Fibromyalgia    GI bleed 2017   History of kidney stones 01/21/2009   HSV-1 infection    Hyperglycemia    Hyperlipidemia    Hypokalemia    Hypothyroidism    Liver cyst    Malignant neoplasm of right female breast, unspecified estrogen receptor status, unspecified site of breast ([place])    Memory loss    Osteopenia    Panic disorder    Schizophrenia ([place])    Ventral hernia     Past Surgical History:  Procedure Laterality Date   ABDOMINAL HYSTERECTOMY     BREAST BIOPSY Right 01/19/2021   [place] Bx, ribbon, path pending   BREAST BIOPSY Right 01/19/2021   [place] Bx, Venus, path pending   BREAST BIOPSY Right 01/19/2021   [place] Bx, heart, path pending   BREAST BIOPSY Right 01/19/2021   [place] BX Axilla, Coil, path pending   CESAREAN SECTION     CHOLECYSTECTOMY OPEN  11/17/2013   with repair of bile duct-Dr. Marina Gravel   MASTECTOMY MODIFIED RADICAL Right 03/02/2021   Procedure: MASTECTOMY MODIFIED RADICAL;  Surgeon: Benjamine Sprague, DO;  Location: [facility] ORS;  Service:  General;  Laterality: Right;   OPEN REDUCTION INTERNAL FIXATION (ORIF) DISTAL RADIAL FRACTURE Right 12/20/2014   Procedure: OPEN REDUCTION INTERNAL FIXATION (ORIF) RIGHT DISTAL RADIAL FRACTURE;  Surgeon: Renette Butters, MD;  Location: [place];  Service: Orthopedics;  Laterality: Right;   STOMACH SURGERY      There were no vitals filed for this visit.   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     PLAN:       1. 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  She underwent full mastectomy on December 30, 2021.  Despite recommendation of adjuvant Adriamycin and Cytoxan followed by Taxol, patient continues to be adamant that she will not  undergo chemotherapy.  She has highly hesitant to pursue adjuvant XRT as well.  Given her ER/PR status of her tumor, it was recommended patient also take letrozole which she is considering.  No intervention is needed at this time.  Patient will follow-up in 3 weeks for further evaluation and additional treatment planning.   2. Postoperative pain: Patient was given a prescription for hydrocodone today.  3. Lymphedema: Patient was given a referral to lymphedema clinic as well has an appointment with occupational therapy.    Patient Stated Goals To get my right arm smaller and prevent infection    Currently in Pain? No/denies                 LYMPHEDEMA/ONCOLOGY QUESTIONNAIRE - 10/29/21 0001       Right Upper Extremity Lymphedema   15 cm Proximal to Olecranon Process 29 cm    10 cm Proximal to  Olecranon Process 29 cm    Olecranon Process 27 cm    15 cm Proximal to Ulnar Styloid Process 26.8 cm    10 cm Proximal to Ulnar Styloid Process 25 cm    Just Proximal to Ulnar Styloid Process 18.2 cm    Across Hand at [employer] 17 cm                 Patient arrived with Tubigrip on arm but slide down to mid upper arm and bandage on R UE - but rolling - causing increase circumference  R UE forearm increase again since last week . Measurements taken of circumference see flowsheet.  Visually upper extremity looked much better   Patient educated on lymphatic bandaging for right upper extremity again -and motivated - to decrease infection risk and she done great in May 2023 Applied Eucerin lotion  Programmer, systems glove soft stockinette applied to right upper extremity Artiflex 15 cm applied over and thru hand 2 x - and then overlapping 50% to upper arm  6 cm short stretch bandage 3 cycles around the wrist through thumb alternating and up to forearm figure 8's 8 cm at wrist 1 time through the thumb overlapping 50% up to proximal to axilla  Topper of Tubigrip E on upper arm    Patient and son ( on speaker phone) educated on precautions and when to remove or rewrap bandages. And POC- schedule  Answered again and educated patient on anatomy of lymphatics.  As well as lymphedema.   Return in 3 days to reassess circumference and plan rebandage again -to fit in her day time compression sleeve- she says she has it in drawer - pt to bring in next time                       OT Education - 10/29/21 1641     Education Details bandaging and POC    Person(s) Educated Patient;Parent(s)    Methods Explanation;Demonstration;Verbal cues;Handout;Tactile cues    Comprehension Verbal cues required;Tactile cues required;Returned demonstration;Verbalized understanding                 OT Long Term Goals - 10/18/21 1515       OT LONG TERM GOAL #1   Title Patient to be  independent in home program to tolerate and wearing of compression bandaging to decrease circumference in the right upper extremity to get measured for compression garments    Baseline Patient had never done bandaging before and showing right upper extremity and thoracic lymphedema.  Upper arm  increased by 3 cm and elbow and forearm 3 to 4 cm and wrist 2.5 cm.  10/15/2021:  Pt has not been wearing daytime sleeve for a while, increase in measurements.    Time 2    Period Weeks    Status On-going    Target Date 10/29/21      OT LONG TERM GOAL #2   Title Patient to be independent and wearing of compression garment to decrease and maintain right upper extremity circumference.    Baseline Patient increased by 3 to 4 cm in right upper extremity compared to left.  Has no pump or compression done in the past.  No knowledge.  10/15/21:  reassessment of measurements, increases since last session and has not worn sleeve.    Time 6    Period Weeks    Status On-going    Target Date 07/05/21      OT LONG TERM GOAL #3   Title Patient to be independent in use of pneumatic compression pump to supplement daytime compression to maintain circumference and decrease future infection in right upper extremity    Baseline Assess if patient can be fitted with a pneumatic pump has no knowledge would not be able to tolerate a nighttime garment because of mental health would recommend the pump to use morning and evening. 10/15/2021: using pump consistently    Time 6    Period Weeks    Status Partially Met    Target Date 07/05/21                   Plan - 10/29/21 1642     Clinical Impression Statement Patient  referred by Dr. Grayland Ormond for right upper extremity lymphedema.  Patient is  s/p right mastectomy with 11-13 lymph nodes removed.  Pt was seen by this OT in May 23 for bandaging of R UE to decrease lymphedema and circumference in R UE - and fitted with daytime compression sleeve and glove as well as pump. Pt  return last week with increase of circumference in R UE lymphedema.  Pt wore some of the tubigrip compression and glove since last week. Pt return 10/15/21 with measurement taken in bilateral UE and compare to May 23. Pt did loose weight since then.  Pt R UE increase compare to May 23 by  3 cm in upper arm and elbow. Forearm  increase 5-6 cm , wrist 5 cm and hand 1.5 cm.  Discussed significant increases since May 23 with elbow to wrist the most. Pt  stated in  the past that she do not want to be bandage again - but also discuss with pt risk for infection - and pt arrive with Melburn Popper today - she had great success in May with 2 wks of banaging to decongest R UE .Pt was unable to decrease her R UE circumference the last 2 wks with using her pump and doing one layer bandate at home. Pt R UE was bandage this date by OT with 2 layer bandage - had pt and Son on speaker on phone to discuss plan for this week and next week to decongest R UE to get fitted with compression sleeve to decrease infection risk.   Patient can benefit from skilled OT services to decongest right upper extremity to keep lymphedema under control with appropriate compression garments and a home program- to prevent future cellulitis issues.    OT Occupational Profile and History Problem Focused Assessment - Including review of records relating to presenting problem  Occupational performance deficits (Please refer to evaluation for details): ADL's;IADL's;Leisure;Play    Body Structure / Function / Physical Skills ADL;Decreased knowledge of precautions;Flexibility;Scar mobility;IADL;Edema    Rehab Potential Good    Clinical Decision Making Limited treatment options, no task modification necessary    Comorbidities Affecting Occupational Performance: None    Modification or Assistance to Complete Evaluation  No modification of tasks or assist necessary to complete eval    OT Frequency 3x / week    OT Duration 6 weeks    OT Treatment/Interventions  Self-care/ADL training;Manual lymph drainage;DME and/or AE instruction;Compression bandaging;Scar mobilization;Manual Therapy;Patient/family education    Consulted and Agree with Plan of Care Patient             Patient will benefit from skilled therapeutic intervention in order to improve the following deficits and impairments:   Body Structure / Function / Physical Skills: ADL, Decreased knowledge of precautions, Flexibility, Scar mobility, IADL, Edema       Visit Diagnosis: Postmastectomy lymphedema syndrome  Scar condition and fibrosis of skin    Problem List Patient Active Problem List   Diagnosis Date Noted   Major depressive disorder, recurrent severe without psychotic features ([place]) 04/21/2021   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[place]) 01/28/2021   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   Medical non-compliance 08/17/2020   Hepatic abscess 01/10/2016   History of diabetes mellitus, type II 11/04/2014   History of essential hypertension 11/04/2014   Intra-abdominal and pelvic swelling, mass and lump, unspecified site 11/03/2013    Elizabeth [name], Elizabeth [name],Elizabeth [name] 10/29/2021, 4:48 PM  [facility] [address] [place], [address] Phone: [phone]   Fax:  [phone]  Name: Elizabeth [name] MRN: [id number] Date of Birth: [birth year]

## 2021-11-01 ENCOUNTER — Ambulatory Visit: Payer: Medicare HMO | Admitting: Occupational Therapy

## 2021-11-01 DIAGNOSIS — I972 Postmastectomy lymphedema syndrome: Secondary | ICD-10-CM | POA: Diagnosis not present

## 2021-11-01 DIAGNOSIS — L905 Scar conditions and fibrosis of skin: Secondary | ICD-10-CM

## 2021-11-01 NOTE — Therapy (Signed)
[facility] [address], [place], [address] Phone: [phone]   Fax:  [phone]  Occupational Therapy Treatment  Patient Details  Name: Elizabeth [name] MRN: [id number] Date of Birth: 08/21/1950 Referring Provider (OT): Dr Grayland Ormond   Encounter Date: 11/01/2021   OT End of Session - 11/01/21 1127     Visit Number 9    Number of Visits 18    Date for OT Re-Evaluation 11/26/21    OT Start Time 1127    OT Stop Time 1201    OT Time Calculation (min) 34 min    Activity Tolerance Patient tolerated treatment well    Behavior During Therapy [facility] for tasks assessed/performed             Past Medical History:  Diagnosis Date   Acute cholecystitis 2016   Anemia    Anemia 2016   Anxiety    panic attacks   Anxiety and depression    Bipolar 1 disorder (HCC)    Bowel obstruction ([place]) 2017   Common bile duct dilatation 2015   Dyspnea    Edema    Fibromyalgia    GI bleed 2017   History of kidney stones 01/21/2009   HSV-1 infection    Hyperglycemia    Hyperlipidemia    Hypokalemia    Hypothyroidism    Liver cyst    Malignant neoplasm of right female breast, unspecified estrogen receptor status, unspecified site of breast ([place])    Memory loss    Osteopenia    Panic disorder    Schizophrenia ([place])    Ventral hernia     Past Surgical History:  Procedure Laterality Date   ABDOMINAL HYSTERECTOMY     BREAST BIOPSY Right 01/19/2021   [place] Bx, ribbon, path pending   BREAST BIOPSY Right 01/19/2021   [place] Bx, Venus, path pending   BREAST BIOPSY Right 01/19/2021   [place] Bx, heart, path pending   BREAST BIOPSY Right 01/19/2021   [place] BX Axilla, Coil, path pending   CESAREAN SECTION     CHOLECYSTECTOMY OPEN  11/17/2013   with repair of bile duct-Dr. Marina Gravel   MASTECTOMY MODIFIED RADICAL Right 03/02/2021   Procedure: MASTECTOMY MODIFIED RADICAL;  Surgeon: Benjamine Sprague, DO;  Location: [facility] ORS;  Service:  General;  Laterality: Right;   OPEN REDUCTION INTERNAL FIXATION (ORIF) DISTAL RADIAL FRACTURE Right 12/20/2014   Procedure: OPEN REDUCTION INTERNAL FIXATION (ORIF) RIGHT DISTAL RADIAL FRACTURE;  Surgeon: Renette Butters, MD;  Location: [place];  Service: Orthopedics;  Laterality: Right;   STOMACH SURGERY      There were no vitals filed for this visit.   Subjective Assessment - 11/01/21 1127     Subjective  My arm really itches and I have to scratch it.  Do think he lost weight versus small portion like me.  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     PLAN:       1. 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  She underwent full mastectomy on December 30, 2021.  Despite recommendation of adjuvant Adriamycin and Cytoxan followed by Taxol, patient continues to be adamant that she will  not undergo chemotherapy.  She has highly hesitant to pursue adjuvant XRT as well.  Given her ER/PR status of her tumor, it was recommended patient also take letrozole which she is considering.  No intervention is needed at this time.  Patient will follow-up in 3 weeks for further evaluation and additional treatment planning.   2. Postoperative pain: Patient was given a prescription for hydrocodone today.  3. Lymphedema: Patient was given a referral to lymphedema clinic as well has an appointment with occupational therapy.    Patient Stated Goals To get my right arm smaller and prevent infection    Currently in Pain? No/denies                 LYMPHEDEMA/ONCOLOGY QUESTIONNAIRE - 11/01/21 0001       Right Upper Extremity Lymphedema   15 cm Proximal to Olecranon Process 26.5 cm     10 cm Proximal to Olecranon Process 27.5 cm    Olecranon Process 28 cm    15 cm Proximal to Ulnar Styloid Process 30 cm    10 cm Proximal to Ulnar Styloid Process 22.5 cm    Just Proximal to Ulnar Styloid Process 15 cm    Across Hand at [employer] 16.8 cm                Patient arrived with bandages all loosened up at elbow and proximal forearm causing that area to have significant increase in circumference.   Wrist and distal forearm as well as upper arm decrease greatly in circumference.  . Measurements taken of circumference see flowsheet.     Patient educated on lymphatic bandaging for right upper extremity again -and motivated - to decrease infection risk and she done great in May 2023 Patient report that elbow and forearm was itching that is why she tried to scratch it and pulled the bandages apart. Patient did skincare to arm and bathroom had multiple times tell patient not to scratch but only wrap her arm please. Applied Eucerin lotion and cortisone ointment that son both in the past on elbow and forearm Applied Isotoner glove soft stockinette applied to right upper extremity Artiflex 15 cm applied over and thru hand 2 x - and then overlapping 50% to upper arm   6 cm short stretch bandage 3 cycles around the wrist through thumb alternating and up to forearm figure 8's 8 cm at wrist 1 time through the thumb overlapping 50% up to proximal to axilla  Topper of Tubigrip E on upper arm  Told patient to lightly padded bandages wherever she is itching and apply ice over it to decrease itching.   Patient and son ( on speaker phone last visit) educated on precautions and when to remove or rewrap bandages. And POC- schedule  Answered again and educated patient on anatomy of lymphatics.  As well as lymphedema.   Patient to return tomorrow for reassess circumference and plan rebandage again -to fit in her day time compression sleeve- s patient did bring in today her day sleeve  and glove that was she was fitted within the past               OT Education - 11/01/21 1127     Education Details bandaging and POC    Person(s) Educated Patient;Parent(s)    Methods Explanation;Demonstration;Verbal cues;Handout;Tactile cues    Comprehension Verbal cues required;Tactile cues required;Returned demonstration;Verbalized understanding  OT Long Term Goals - 10/18/21 1515       OT LONG TERM GOAL #1   Title Patient to be independent in home program to tolerate and wearing of compression bandaging to decrease circumference in the right upper extremity to get measured for compression garments    Baseline Patient had never done bandaging before and showing right upper extremity and thoracic lymphedema.  Upper arm increased by 3 cm and elbow and forearm 3 to 4 cm and wrist 2.5 cm.  10/15/2021:  Pt has not been wearing daytime sleeve for a while, increase in measurements.    Time 2    Period Weeks    Status On-going    Target Date 10/29/21      OT LONG TERM GOAL #2   Title Patient to be independent and wearing of compression garment to decrease and maintain right upper extremity circumference.    Baseline Patient increased by 3 to 4 cm in right upper extremity compared to left.  Has no pump or compression done in the past.  No knowledge.  10/15/21:  reassessment of measurements, increases since last session and has not worn sleeve.    Time 6    Period Weeks    Status On-going    Target Date 07/05/21      OT LONG TERM GOAL #3   Title Patient to be independent in use of pneumatic compression pump to supplement daytime compression to maintain circumference and decrease future infection in right upper extremity    Baseline Assess if patient can be fitted with a pneumatic pump has no knowledge would not be able to tolerate a nighttime garment because of mental health would recommend the pump to use morning and evening. 10/15/2021: using pump  consistently    Time 6    Period Weeks    Status Partially Met    Target Date 07/05/21                   Plan - 11/01/21 1127     Clinical Impression Statement Patient  referred by Dr. Grayland Ormond for right upper extremity lymphedema.  Patient is  s/p right mastectomy with 11-13 lymph nodes removed.  Pt was seen by this OT in May 23 for bandaging of R UE to decrease lymphedema and circumference in R UE - and fitted with daytime compression sleeve and glove as well as pump. Pt return last week with increase of circumference in R UE lymphedema.  Pt wore some of the tubigrip compression and glove since last week. Pt return 10/15/21 with measurement taken in bilateral UE and compare to May 23. Pt did loose weight since then.  Pt R UE increase compare to May 23 by  3 cm in upper arm and elbow. Forearm  increase 5-6 cm , wrist 5 cm and hand 1.5 cm.  Discussed significant increases since May 23 with elbow to wrist the most.  Patient right upper extremity was bandaged 3 days ago.  Patient arrived this date with bandages [place] and loosened at proximal forearm and elbow causing a significant increase in circumference at proximal forearm and elbow.  Wrist and distal forearm as well as upper arm decrease greatly.  Patient reports that is really itching.  Did apply cortisone ointment on her elbow as well as thick lotion prior to bandaging.  Reminded patient several times during session not to scratch her arm but to rub it.  Also told patient to pad likely her arm and put ice  pack over her the bandages at home if it itches.  Patient scheduled to return tomorrow for follow-up. R UE was bandage this date by OT with 2 layer bandage again- had  Son on speaker  phone last visit to discuss plan for this week and next week to decongest R UE to get fitted with compression sleeve to decrease infection risk.   Patient can benefit from skilled OT services to decongest right upper extremity to keep lymphedema under control  with appropriate compression garments and a home program- to prevent future cellulitis issues.    OT Occupational Profile and History Problem Focused Assessment - Including review of records relating to presenting problem    Occupational performance deficits (Please refer to evaluation for details): ADL's;IADL's;Leisure;Play    Body Structure / Function / Physical Skills ADL;Decreased knowledge of precautions;Flexibility;Scar mobility;IADL;Edema    Rehab Potential Good    Clinical Decision Making Limited treatment options, no task modification necessary    Comorbidities Affecting Occupational Performance: None    Modification or Assistance to Complete Evaluation  No modification of tasks or assist necessary to complete eval    OT Frequency 3x / week    OT Duration 6 weeks    OT Treatment/Interventions Self-care/ADL training;Manual lymph drainage;DME and/or AE instruction;Compression bandaging;Scar mobilization;Manual Therapy;Patient/family education    Consulted and Agree with Plan of Care Patient             Patient will benefit from skilled therapeutic intervention in order to improve the following deficits and impairments:   Body Structure / Function / Physical Skills: ADL, Decreased knowledge of precautions, Flexibility, Scar mobility, IADL, Edema       Visit Diagnosis: Postmastectomy lymphedema syndrome  Scar condition and fibrosis of skin    Problem List Patient Active Problem List   Diagnosis Date Noted   Major depressive disorder, recurrent severe without psychotic features ([place]) 04/21/2021   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[place]) 01/28/2021   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   Medical non-compliance 08/17/2020   Hepatic abscess 01/10/2016   History of diabetes mellitus, type II 11/04/2014   History of essential hypertension 11/04/2014   Intra-abdominal and pelvic swelling, mass and lump, unspecified site 11/03/2013    Rosalyn Gess,  OTR/L,CLT 11/01/2021, 1:16 PM  [facility] [address], [place], [address] Phone: [phone]   Fax:  [phone]  Name: Elizabeth [name] MRN: [id number] Date of Birth: July 22, 1950

## 2021-11-02 ENCOUNTER — Ambulatory Visit: Payer: Medicare HMO | Admitting: Occupational Therapy

## 2021-11-02 DIAGNOSIS — L905 Scar conditions and fibrosis of skin: Secondary | ICD-10-CM

## 2021-11-02 DIAGNOSIS — I972 Postmastectomy lymphedema syndrome: Secondary | ICD-10-CM

## 2021-11-02 NOTE — Therapy (Signed)
[facility] [address], [place], [address] Phone: [phone]   Fax:  [phone]  Occupational Therapy Treatment  Patient Details  Name: Elizabeth [name] MRN: [id number] Date of Birth: 1950/08/24 Referring Provider (OT): Dr Grayland Ormond   Encounter Date: 11/02/2021   OT End of Session - 11/02/21 1337     Visit Number 10    Number of Visits 18    Date for OT Re-Evaluation 11/26/21    OT Start Time 1129    OT Stop Time 1208    OT Time Calculation (min) 39 min    Activity Tolerance Patient tolerated treatment well    Behavior During Therapy [facility] for tasks assessed/performed             Past Medical History:  Diagnosis Date   Acute cholecystitis 2016   Anemia    Anemia 2016   Anxiety    panic attacks   Anxiety and depression    Bipolar 1 disorder (HCC)    Bowel obstruction ([place]) 2017   Common bile duct dilatation 2015   Dyspnea    Edema    Fibromyalgia    GI bleed 2017   History of kidney stones 01/21/2009   HSV-1 infection    Hyperglycemia    Hyperlipidemia    Hypokalemia    Hypothyroidism    Liver cyst    Malignant neoplasm of right female breast, unspecified estrogen receptor status, unspecified site of breast ([place])    Memory loss    Osteopenia    Panic disorder    Schizophrenia ([place])    Ventral hernia     Past Surgical History:  Procedure Laterality Date   ABDOMINAL HYSTERECTOMY     BREAST BIOPSY Right 01/19/2021   [place] Bx, ribbon, path pending   BREAST BIOPSY Right 01/19/2021   [place] Bx, Venus, path pending   BREAST BIOPSY Right 01/19/2021   [place] Bx, heart, path pending   BREAST BIOPSY Right 01/19/2021   [place] BX Axilla, Coil, path pending   CESAREAN SECTION     CHOLECYSTECTOMY OPEN  11/17/2013   with repair of bile duct-Dr. Marina Gravel   MASTECTOMY MODIFIED RADICAL Right 03/02/2021   Procedure: MASTECTOMY MODIFIED RADICAL;  Surgeon: Benjamine Sprague, DO;  Location: [facility];  Service:  General;  Laterality: Right;   OPEN REDUCTION INTERNAL FIXATION (ORIF) DISTAL RADIAL FRACTURE Right 12/20/2014   Procedure: OPEN REDUCTION INTERNAL FIXATION (ORIF) RIGHT DISTAL RADIAL FRACTURE;  Surgeon: Renette Butters, MD;  Location: [facility];  Service: Orthopedics;  Laterality: Right;   STOMACH SURGERY      There were no vitals filed for this visit.   Subjective Assessment - 11/02/21 1336     Subjective  My arm is still itching.  My daughter is coming in today around 1:00.  My son is coming in next week.    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     PLAN:       1. 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  She underwent full mastectomy on December 30, 2021.  Despite recommendation of adjuvant Adriamycin and Cytoxan followed by Taxol, patient continues to be adamant that she will not undergo chemotherapy.  She has highly  hesitant to pursue adjuvant XRT as well.  Given her ER/PR status of her tumor, it was recommended patient also take letrozole which she is considering.  No intervention is needed at this time.  Patient will follow-up in 3 weeks for further evaluation and additional treatment planning.   2. Postoperative pain: Patient was given a prescription for hydrocodone today.  3. Lymphedema: Patient was given a referral to lymphedema clinic as well has an appointment with occupational therapy.    Patient Stated Goals To get my right arm smaller and prevent infection    Currently in Pain? No/denies                 LYMPHEDEMA/ONCOLOGY QUESTIONNAIRE - 11/02/21 0001       Right Upper Extremity Lymphedema   10 cm Proximal to Olecranon Process 27.5 cm    Olecranon Process 28 cm    15  cm Proximal to Ulnar Styloid Process 28.2 cm    10 cm Proximal to Ulnar Styloid Process 21.5 cm    Just Proximal to Ulnar Styloid Process 15 cm    Across Hand at [employer] 17 cm                 Patient arrived with bandages  loosened and folded at proximal forearm causing to still be significant increase in circumference. Did decrease compare to yesterday Wrist and distal forearm as well as upper arm  cont to decrease greatly in circumference.  . Measurements taken of circumference see flowsheet.     Patient educated on lymphatic bandaging for right upper extremity again -and motivated - to decrease infection risk and she done great in May 2023 Patient report that elbow and forearm was itching that is why she tried to scratch it and pulled the bandages apart. She also are wearing long sleeve that could slide bandages -ask pt to wear maybe short sleeve shirt under jacket  Done fibrotic techniques and MLD to prox forearm and elbow to decrease circumference where bandages cause increase circumference   Patient did skincare to arm in  bathroom Applied Eucerin lotion and cortisone ointment that son bought in the past on elbow and forearm Applied Isotoner glove soft stockinette applied to right upper extremity Switch to Komprex foam circulare around elbow and prox forearm under  Rosidal foam from hand - circular up overlapping 50% to upper arm   6 cm short stretch bandage 3 cycles around the wrist through thumb alternating and up to forearm figure 8's 8 cm at wrist 1 time through the thumb overlapping 50% up to proximal to axilla  Topper of Tubigrip F on upper arm    Patient and son ( on speaker phone ) educated on precautions and when to remove or rewrap bandages. And POC- schedule  Answered again and educated patient on anatomy of lymphatics.  As well as lymphedema.   Pt return again on Tues - Goal to decongest to fit in her day time compression sleeve-  patient did bring in  tlast visit day sleeve and glove that she was fitted within the past                OT Education - 11/02/21 1337     Education Details bandaging and POC    Person(s) Educated Patient;Child(ren)    Methods Explanation;Demonstration;Verbal cues;Handout;Tactile cues    Comprehension Verbal cues required;Tactile cues required;Returned demonstration;Verbalized understanding  OT Long Term Goals - 10/18/21 1515       OT LONG TERM GOAL #1   Title Patient to be independent in home program to tolerate and wearing of compression bandaging to decrease circumference in the right upper extremity to get measured for compression garments    Baseline Patient had never done bandaging before and showing right upper extremity and thoracic lymphedema.  Upper arm increased by 3 cm and elbow and forearm 3 to 4 cm and wrist 2.5 cm.  10/15/2021:  Pt has not been wearing daytime sleeve for a while, increase in measurements.    Time 2    Period Weeks    Status On-going    Target Date 10/29/21      OT LONG TERM GOAL #2   Title Patient to be independent and wearing of compression garment to decrease and maintain right upper extremity circumference.    Baseline Patient increased by 3 to 4 cm in right upper extremity compared to left.  Has no pump or compression done in the past.  No knowledge.  10/15/21:  reassessment of measurements, increases since last session and has not worn sleeve.    Time 6    Period Weeks    Status On-going    Target Date 07/05/21      OT LONG TERM GOAL #3   Title Patient to be independent in use of pneumatic compression pump to supplement daytime compression to maintain circumference and decrease future infection in right upper extremity    Baseline Assess if patient can be fitted with a pneumatic pump has no knowledge would not be able to tolerate a nighttime garment because of mental health would recommend the pump to use morning and evening. 10/15/2021:  using pump consistently    Time 6    Period Weeks    Status Partially Met    Target Date 07/05/21                   Plan - 11/02/21 1337     Clinical Impression Statement Patient  referred by Dr. Grayland Ormond for right upper extremity lymphedema.  Patient is  s/p right mastectomy with 11-13 lymph nodes removed.  Pt was seen by this OT in May 23 for bandaging of R UE to decrease lymphedema and circumference in R UE - and fitted with daytime compression sleeve and glove as well as pump. Pt return last week with increase of circumference in R UE lymphedema.  Pt wore some of the tubigrip compression and glove since last week. Pt return 10/15/21 with measurement taken in bilateral UE and compare to May 23. Pt did loose weight since then.  Pt R UE increase compare to May 23 by  3 cm in upper arm and elbow. Forearm  increase 5-6 cm , wrist 5 cm and hand 1.5 cm.  Discussed significant increases since May 23 with elbow to wrist the most.  Patient  R UE was bandaged  Monday and follow up after 3 days pt's banages was rolled and loosened at proximal forearm and elbow causing a significant increase in circumference at proximal forearm and elbow.  Wrist and distal forearm as well as upper arm decrease greatly.  Patient reports that is really itching.  Did apply yesterday cortisone ointment on her elbow as well as thick lotion prior to bandaging. Pt this date again arrive with bandages open and rolled at prox forearm -but better than last time- and R UE circumference decrease some more but  still increase greatly at prox forearm and elbow - MLD and fibrotic tech done- prior to redoing bandages - but this date done Komprex foam circular over prox forearm under Rosidal foam from hand to axilla instead of Artiflex.  Reminded patient several times during session not to scratch her arm but to rub it.  Also told patient to pad lightly her arm and put ice pack over her the bandages at home if it itches.  Patient scheduled  to return Tues for follow-up. Son on speaker  phone today again about POC to decongest R UE to get her to fit back into her compression sleeve to decrease infection risk.   Patient can benefit from skilled OT services to decongest right upper extremity to keep lymphedema under control with appropriate compression garments and a home program- to prevent future cellulitis issues.    OT Occupational Profile and History Problem Focused Assessment - Including review of records relating to presenting problem    Occupational performance deficits (Please refer to evaluation for details): ADL's;IADL's;Leisure;Play    Body Structure / Function / Physical Skills ADL;Decreased knowledge of precautions;Flexibility;Scar mobility;IADL;Edema    Rehab Potential Good    Clinical Decision Making Limited treatment options, no task modification necessary    Comorbidities Affecting Occupational Performance: None    Modification or Assistance to Complete Evaluation  No modification of tasks or assist necessary to complete eval    OT Frequency 3x / week    OT Duration 6 weeks    OT Treatment/Interventions Self-care/ADL training;Manual lymph drainage;DME and/or AE instruction;Compression bandaging;Scar mobilization;Manual Therapy;Patient/family education    Consulted and Agree with Plan of Care Patient             Patient will benefit from skilled therapeutic intervention in order to improve the following deficits and impairments:   Body Structure / Function / Physical Skills: ADL, Decreased knowledge of precautions, Flexibility, Scar mobility, IADL, Edema       Visit Diagnosis: Postmastectomy lymphedema syndrome  Scar condition and fibrosis of skin    Problem List Patient Active Problem List   Diagnosis Date Noted   Major depressive disorder, recurrent severe without psychotic features ([place]) 04/21/2021   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[place]) 01/28/2021   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    Medical non-compliance 08/17/2020   Hepatic abscess 01/10/2016   History of diabetes mellitus, type II 11/04/2014   History of essential hypertension 11/04/2014   Intra-abdominal and pelvic swelling, mass and lump, unspecified site 11/03/2013    Rosalyn Gess, OTR/L,CLT 11/02/2021, 1:50 PM  [facility] [address], [place], [address] Phone: [phone]   Fax:  [phone]  Name: Elizabeth [name] MRN: [id number] Date of Birth: 21-May-1950

## 2021-11-03 NOTE — Progress Notes (Unsigned)
BH MD/PA/NP OP Progress Note  11/05/2021 1:09 PM JANDI SWIGER  MRN:  [id number]  Chief Complaint:  Chief Complaint  Patient presents with   Follow-up   HPI:  This is a follow-up appointment for bipolar disorder, anxiety state and then insomnia.  When she enters the room, she states that she lost her weight.  She eats once a day due to appetite loss.  She drinks Ensure every day.  When she was asked about concern of weight gain, which was reported in the past, she denies that, and reports that she is concerned about her weight. She then states that she is "antichrist." She states that she goes to daycare twice a week.  It has been going "alright," doing arts and crafts. She enjoys having care giver three times a week.  She hopes to have her rather than going to daycare.  She states that this caregiver is fun to be with, and she has a smile on her face.  She enjoys coloring.  She states that she likes to do things, and she wishes to have a job.  When she was asked about her medication, she states that she takes it a few times per week.  When she was asked about the reason, she states that she does not think it is good for her, and "don't take it, period."  She denies any side effect from the medication.  She states that she tends to scream in the house.  She is thinks her "face is changed."  She believes the features of her face are changed.  She also feels that her foot is rotated outward. She thinks her shoulder is "growing." She thinks she has head in her shoulder, stating that she read in the Bible that antichrist has had on each side.  She feels terrified of this.  She feels like she is antichrist. The patient has mood symptoms as in PHQ-9/GAD-7. She denies SI, HI, hallucinations.   [name], her son presents to the visit.  He states that he was surprised by how Onisha has been honest about things.  He does not have anything to add.  He thinks that her body image, "antichrist" has been  getting worse over the past year or so.  He is aware that she does not take medication at times.  He states that he has been working for people with intellectual disability, and shares his view about ECT. He states that people have right to say no to this, and he is against this treatment. He thinks what is needed the most for her is psycho education.    Wt Readings from Last 3 Encounters:  11/05/21 108 lb 9.6 oz (49.3 kg)  08/23/21 110 lb (49.9 kg)  08/02/21 108 lb (49 kg)     Visit Diagnosis:    ICD-10-CM   1. Bipolar affective disorder, current episode depressed, current episode severity unspecified ([place])  F31.30     2. Insomnia, unspecified type  G47.00     3. Anxiety state  F41.1       Past Psychiatric History: Please see initial evaluation for full details. I have reviewed the history. No updates at this time.     Past Medical History:  Past Medical History:  Diagnosis Date   Acute cholecystitis 2016   Anemia    Anemia 2016   Anxiety    panic attacks   Anxiety and depression    Bipolar 1 disorder ([place])    Bowel obstruction ([place]) 2017  Common bile duct dilatation 2015   Dyspnea    Edema    Fibromyalgia    GI bleed 2017   History of kidney stones 01/21/2009   HSV-1 infection    Hyperglycemia    Hyperlipidemia    Hypokalemia    Hypothyroidism    Liver cyst    Malignant neoplasm of right female breast, unspecified estrogen receptor status, unspecified site of breast ([place])    Memory loss    Osteopenia    Panic disorder    Schizophrenia ([place])    Ventral hernia     Past Surgical History:  Procedure Laterality Date   ABDOMINAL HYSTERECTOMY     BREAST BIOPSY Right 01/19/2021   [place] Bx, ribbon, path pending   BREAST BIOPSY Right 01/19/2021   [place] Bx, Venus, path pending   BREAST BIOPSY Right 01/19/2021   [place] Bx, heart, path pending   BREAST BIOPSY Right 01/19/2021   [place] BX Axilla, Coil, path pending   CESAREAN SECTION     CHOLECYSTECTOMY OPEN  11/17/2013   with  repair of bile duct-Dr. Marina Gravel   MASTECTOMY MODIFIED RADICAL Right 03/02/2021   Procedure: MASTECTOMY MODIFIED RADICAL;  Surgeon: Benjamine Sprague, DO;  Location: [facility];  Service: General;  Laterality: Right;   OPEN REDUCTION INTERNAL FIXATION (ORIF) DISTAL RADIAL FRACTURE Right 12/20/2014   Procedure: OPEN REDUCTION INTERNAL FIXATION (ORIF) RIGHT DISTAL RADIAL FRACTURE;  Surgeon: Renette Butters, MD;  Location: [facility];  Service: Orthopedics;  Laterality: Right;   STOMACH SURGERY      Family Psychiatric History: Please see initial evaluation for full details. I have reviewed the history. No updates at this time.     Family History:  Family History  Problem Relation Age of Onset   Heart disease Father    Breast cancer Paternal Aunt    Kidney disease Paternal 36    Breast cancer Paternal Grandmother    Asthma Daughter    Bladder Cancer Neg Hx     Social History:  Social History   Socioeconomic History   Marital status: Married    Spouse name: Not on file   Number of children: 1   Years of education: Not on file   Highest education level: Not on file  Occupational History   Not on file  Tobacco Use   Smoking status: Former    Packs/day: 0.00    Years: 0.00    Total pack years: 0.00    Types: Cigarettes    Quit date: 11/03/1973    Years since quitting: 48.0    Passive exposure: Past   Smokeless tobacco: Never   Tobacco comments:    Smoked briefly as a teen- lives in secondhand smoke.   Vaping Use   Vaping Use: Never used  Substance and Sexual Activity   Alcohol use: No    Alcohol/week: 0.0 standard drinks of alcohol   Drug use: No   Sexual activity: Not Currently  Other Topics Concern   Not on file  Social History Narrative   Not on file   Social Determinants of Health   Financial Resource Strain: Not on file  Food Insecurity: Not on file  Transportation Needs: Not on file  Physical Activity: Not on file  Stress: Not on file  Social  Connections: Not on file    Allergies:  Allergies  Allergen Reactions   Penicillins Hives    TOLERATED CEFAZOLIN    Metabolic Disorder Labs: No results found for: "HGBA1C", "MPG" No results found  for: "PROLACTIN" No results found for: "CHOL", "TRIG", "HDL", "CHOLHDL", "VLDL", "LDLCALC" No results found for: "TSH"  Therapeutic Level Labs: No results found for: "LITHIUM" Lab Results  Component Value Date   VALPROATE <10 (L) 07/18/2021   No results found for: "CBMZ"  Current Medications: Current Outpatient Medications  Medication Sig Dispense Refill   alendronate (FOSAMAX) 70 MG tablet Take 1 tablet by mouth every Monday.     ferrous sulfate 325 (65 FE) MG tablet Take 1 tablet by mouth daily with breakfast.     letrozole (FEMARA) 2.5 MG tablet Take 1 tablet (2.5 mg total) by mouth daily. 90 tablet 3   levothyroxine (SYNTHROID, LEVOTHROID) 88 MCG tablet Take 88 mcg by mouth daily before breakfast.     QUEtiapine (SEROQUEL) 25 MG tablet Take 1 tablet (25 mg total) by mouth at bedtime. 30 tablet 0   sertraline (ZOLOFT) 25 MG tablet Take 1 tablet (25 mg total) by mouth daily. 30 tablet 0   Vitamin D, Ergocalciferol, (DRISDOL) 1.25 MG (50000 UNIT) CAPS capsule Take 50,000 Units by mouth once a week.     No current facility-administered medications for this visit.     Musculoskeletal: Strength & Muscle Tone: within normal limits Gait & Station:  uses a walker Patient leans: N/A  Psychiatric Specialty Exam: Review of Systems  Hematological:  Positive for adenopathy.  Psychiatric/Behavioral:  Positive for dysphoric mood. Negative for agitation, behavioral problems, confusion, decreased concentration, hallucinations, self-injury, sleep disturbance and suicidal ideas. The patient is nervous/anxious. The patient is not hyperactive.   All other systems reviewed and are negative.   Blood pressure 97/63, pulse 98, temperature 97.9 F (36.6 C), temperature source Temporal, height 5'  1.75" (1.568 m), weight 108 lb 9.6 oz (49.3 kg).Body mass index is 20.02 kg/m.  General Appearance: Fairly Groomed  Eye Contact:  Fair  Speech:  Clear and Coherent  Volume:  Normal  Mood:  Anxious  Affect:  Appropriate, Congruent, and Restricted  Thought Process:  Coherent and Irrelevant  Orientation:  Full (Time, Place, and Person)  Thought Content: Paranoid Ideation   Suicidal Thoughts:  No  Homicidal Thoughts:  No  Memory:  Immediate;   Good  Judgement:  Impaired  Insight:  Lacking  Psychomotor Activity:  Normal- no tremors, rigidity, tardive dyskinesia  Concentration:  Concentration: Good and Attention Span: Good  Recall:  Good  Fund of Knowledge: Good  Language: Good  Akathisia:  No  Handed:  Right  AIMS (if indicated): not done  Assets:  Communication Skills Desire for Improvement  ADL's:  Intact  Cognition: WNL  Sleep:  Fair   Screenings: GAD-7    Flowsheet Row Office Visit from 11/05/2021 in [facility]  Total GAD-7 Score 21      PHQ2-9    [place] Visit from 11/05/2021 in [place] Office Visit from 08/23/2021 in [place] Visit from 07/03/2021 in [facility]  PHQ-2 Total Score _0 PHQ-9 Total Score _1 [place] Office Visit from 11/05/2021 in [facility] Office Visit from 08/23/2021 in [facility] ED from 07/18/2021 in [facility] Error: Q3, 4, or 5 should not be populated when Q2 is No Error: Q3, 4, or 5 should not be populated when Q2 is No High Risk         11/05/2021  11:57 AM  Montreal Cognitive Assessment   Visuospatial/ Executive (0/5) 1  Naming (0/3) 3  Attention: Read list of digits (0/2) 1  Attention: Read list of letters (0/1) 0  Attention: Serial 7 subtraction  starting at 100 (0/3) 1  Language: Repeat phrase (0/2) 1  Language : Fluency (0/1) 0  Abstraction (0/2) 1  Delayed Recall (0/5) 0  Orientation (0/6) 6  Total 14     Assessment and Plan:  ROME SCHLAUCH is a 71 y.o. 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   1. Bipolar affective disorder, current episode depressed, current episode severity unspecified ([place]) 2. Insomnia, unspecified type # Anxiety state R/o MDD with psychotic features Unstable.  She continues to demonstrate disorganized thought process with rumination on "antichrist" and have preoccupation with somatic symptoms, although she is less irritable, and redirectable during exam. Differentials includes depression, bipolar disorder (has this diagnosis according to her son, although she has no known manic symptoms), and/or early manifestation of neuropsychiatric symptoms secondary to underlying neurocognitive disorder.  According to her son, she was doing better when she was in the hospital.  Although it was discussed again regarding the referral to ECT due to medication non adherence and weight loss with decreased appetite, both the patient and her son is adamant not to pursue this treatment.  She is not interested in uptitration of the medication either.  She has agreed to work on medication adherence.  Will continue sertraline to target depression.  Will continue quetiapine for bipolar disorder, insomnia and an appetite loss.    R/o cognitive impairment She does have cognitive deficits as evidenced by [place]. Differential diagnosis includes depression. There are no obvious physical signs consistent with parkinson symptoms.  She has an upcoming appointment with the neurologist in Feb.  Will continue to monitor this.    Plan (her son was advised  to contact the clinic if she needs any refills) Continue sertraline 25 mg daily Continue quetiapine 25 mg at night  Next appointment: 12/11 at 9:30, in person.  Her son agrees to come together - she sees a therapist, Tiffany summers LCSW, weekly at [facility],  - consent form to have 2-way conversation with her son, [name] is on file. -Discussed with her son to bring her to the hospital if any concern of continued decrease in appetite, any danger to self/others.     I have reviewed suicide assessment in detail. No change in the following assessment.    The patient demonstrates the following risk factors for suicide: Chronic risk factors for suicide include: psychiatric disorder of depression . Acute risk factors for suicide include: unemployment, social withdrawal/isolation, and recent suicide attempt/discharges from inpatient psychiatry. Protective factors for this patient include: positive social support. Considering these factors, the overall suicide risk at this point appears to be moderate, but not at imminent risk. There are no guns in the house. Patient is appropriate for outpatient follow up.    This clinician has discussed the side effect associated with medication prescribed during this encounter. Please refer to notes in the previous encounters for more details.     The duration of this appointment visit was 45 minutes of face-to-face time with the patient.  Greater than 50% of this time was spent in counseling, explanation of  diagnosis, planning of further management, and coordination of  care.      Collaboration of Care: Collaboration of Care: Other reviewed notes in Epic  Patient/Guardian was advised Release of Information must be obtained prior to any record release in order to collaborate their care with an outside provider. Patient/Guardian was advised if they have not already done so to contact the registration department to sign all necessary forms in order for [place]  to release information regarding their care.   Consent: Patient/Guardian gives verbal consent for treatment and assignment of benefits for services provided during this visit. Patient/Guardian expressed understanding and agreed to proceed.    Norman Clay, MD 11/05/2021, 1:09 PM

## 2021-11-05 ENCOUNTER — Encounter: Payer: Self-pay | Admitting: Psychiatry

## 2021-11-05 ENCOUNTER — Other Ambulatory Visit: Payer: Self-pay

## 2021-11-05 ENCOUNTER — Ambulatory Visit (INDEPENDENT_AMBULATORY_CARE_PROVIDER_SITE_OTHER): Payer: Medicare HMO | Admitting: Psychiatry

## 2021-11-05 VITALS — BP 97/63 | HR 98 | Temp 97.9°F | Ht 61.75 in | Wt 108.6 lb

## 2021-11-05 DIAGNOSIS — G4700 Insomnia, unspecified: Secondary | ICD-10-CM | POA: Diagnosis not present

## 2021-11-05 DIAGNOSIS — F3130 Bipolar disorder, current episode depressed, mild or moderate severity, unspecified: Secondary | ICD-10-CM

## 2021-11-05 DIAGNOSIS — F411 Generalized anxiety disorder: Secondary | ICD-10-CM

## 2021-11-05 NOTE — Patient Instructions (Signed)
Continue sertraline 25 mg daily Continue quetiapine 25 mg at night  Next appointment: 12/11 at 9:30, in person

## 2021-11-06 ENCOUNTER — Ambulatory Visit: Payer: Medicare HMO | Admitting: Occupational Therapy

## 2021-11-06 DIAGNOSIS — L905 Scar conditions and fibrosis of skin: Secondary | ICD-10-CM

## 2021-11-06 DIAGNOSIS — I972 Postmastectomy lymphedema syndrome: Secondary | ICD-10-CM | POA: Diagnosis not present

## 2021-11-06 NOTE — Therapy (Signed)
[facility] [address], [place], [address] Phone: [phone]   Fax:  [phone]  Occupational Therapy Treatment  Patient Details  Name: Elizabeth [name] MRN: [id number] Date of Birth: 04-09-1950 Referring Provider (OT): Dr Grayland Ormond   Encounter Date: 11/06/2021   OT End of Session - 11/06/21 1705     Visit Number 11    Number of Visits 18    Date for OT Re-Evaluation 11/26/21    OT Start Time 1705    OT Stop Time 1740    OT Time Calculation (min) 35 min    Activity Tolerance Patient tolerated treatment well    Behavior During Therapy [facility] for tasks assessed/performed             Past Medical History:  Diagnosis Date   Acute cholecystitis 2016   Anemia    Anemia 2016   Anxiety    panic attacks   Anxiety and depression    Bipolar 1 disorder (HCC)    Bowel obstruction ([place]) 2017   Common bile duct dilatation 2015   Dyspnea    Edema    Fibromyalgia    GI bleed 2017   History of kidney stones 01/21/2009   HSV-1 infection    Hyperglycemia    Hyperlipidemia    Hypokalemia    Hypothyroidism    Liver cyst    Malignant neoplasm of right female breast, unspecified estrogen receptor status, unspecified site of breast ([place])    Memory loss    Osteopenia    Panic disorder    Schizophrenia ([place])    Ventral hernia     Past Surgical History:  Procedure Laterality Date   ABDOMINAL HYSTERECTOMY     BREAST BIOPSY Right 01/19/2021   [place] Bx, ribbon, path pending   BREAST BIOPSY Right 01/19/2021   [place] Bx, Venus, path pending   BREAST BIOPSY Right 01/19/2021   [place] Bx, heart, path pending   BREAST BIOPSY Right 01/19/2021   [place] BX Axilla, Coil, path pending   CESAREAN SECTION     CHOLECYSTECTOMY OPEN  11/17/2013   with repair of bile duct-Dr. Marina Gravel   MASTECTOMY MODIFIED RADICAL Right 03/02/2021   Procedure: MASTECTOMY MODIFIED RADICAL;  Surgeon: Benjamine Sprague, DO;  Location: [facility] ORS;  Service:  General;  Laterality: Right;   OPEN REDUCTION INTERNAL FIXATION (ORIF) DISTAL RADIAL FRACTURE Right 12/20/2014   Procedure: OPEN REDUCTION INTERNAL FIXATION (ORIF) RIGHT DISTAL RADIAL FRACTURE;  Surgeon: Renette Butters, MD;  Location: [facility];  Service: Orthopedics;  Laterality: Right;   STOMACH SURGERY      There were no vitals filed for this visit.   Subjective Assessment - 11/06/21 1705     Subjective  I see my daughter and my son this weekend.  I got my hair done.  [name] read on my bandages yesterday I think.  You not can be happy with my bandages today.  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     PLAN:       1. 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  She underwent full mastectomy on December 30, 2021.  Despite recommendation of adjuvant Adriamycin and Cytoxan followed by Taxol,  patient continues to be adamant that she will not undergo chemotherapy.  She has highly hesitant to pursue adjuvant XRT as well.  Given her ER/PR status of her tumor, it was recommended patient also take letrozole which she is considering.  No intervention is needed at this time.  Patient will follow-up in 3 weeks for further evaluation and additional treatment planning.   2. Postoperative pain: Patient was given a prescription for hydrocodone today.  3. Lymphedema: Patient was given a referral to lymphedema clinic as well has an appointment with occupational therapy.    Patient Stated Goals To Get My Right Arm Smaller and Prevent Infection    Currently in Pain? No/denies                 LYMPHEDEMA/ONCOLOGY QUESTIONNAIRE - 11/06/21 0001       Right Upper Extremity Lymphedema   15 cm  Proximal to Olecranon Process 28 cm    10 cm Proximal to Olecranon Process 28 cm    Olecranon Process 28 cm    15 cm Proximal to Ulnar Styloid Process 28.4 cm    10 cm Proximal to Ulnar Styloid Process 25 cm    Just Proximal to Ulnar Styloid Process 15 cm    Across Hand at [employer] 17 cm                Patient arrived with bandages  loosened and still significant increase in circumference at forearm. Did decrease compare to earlier last week. Wrist and upper arm  cont to decrease greatly in circumference.  . Measurements taken of circumference see flowsheet.     Patient educated on lymphatic bandaging for right upper extremity again -and motivated - to decrease infection risk and she done great in May 2023 Patient report that elbow and forearm was itching that is why she tried to scratch it and pulled the bandages apart. She she did had short sleeves on this date and reminded patient again and caretaker that short sleeves would not disrupt the bandages.    done fibrotic techniques and MLD to prox forearm and elbow to decrease circumference where bandages cause increase circumference    Patient did skincare to arm in  bathroom Applied Eucerin lotion and cortisone ointment that son bought in the past on elbow and forearm Applied Isotoner glove soft stockinette applied to right upper extremity  Komprex foam circulare around elbow and prox forearm under  Rosidal foam from hand - circular up overlapping 50% to upper arm   6 cm short stretch bandage 3 cycles around the wrist through thumb alternating and up to forearm figure 8's 10 cm at wrist 1 time through the thumb overlapping 50% up to proximal to axilla  Topper of Tubigrip F on upper arm    Patient and caregiver was educated verbally on bandaging if needed to be redone or replaced over the weekend.   Patient to keep bandages on for the next 48 hours for follow-up.            OT Education - 11/06/21 1705      Education Details bandaging and POC    Person(s) Educated Patient;Caregiver(s)    Methods Explanation;Demonstration;Verbal cues;Handout;Tactile cues    Comprehension Verbal cues required;Tactile cues required;Returned demonstration;Verbalized understanding                 OT Long Term Goals - 10/18/21 1515       OT LONG TERM GOAL #1   Title Patient to  be independent in home program to tolerate and wearing of compression bandaging to decrease circumference in the right upper extremity to get measured for compression garments    Baseline Patient had never done bandaging before and showing right upper extremity and thoracic lymphedema.  Upper arm increased by 3 cm and elbow and forearm 3 to 4 cm and wrist 2.5 cm.  10/15/2021:  Pt has not been wearing daytime sleeve for a while, increase in measurements.    Time 2    Period Weeks    Status On-going    Target Date 10/29/21      OT LONG TERM GOAL #2   Title Patient to be independent and wearing of compression garment to decrease and maintain right upper extremity circumference.    Baseline Patient increased by 3 to 4 cm in right upper extremity compared to left.  Has no pump or compression done in the past.  No knowledge.  10/15/21:  reassessment of measurements, increases since last session and has not worn sleeve.    Time 6    Period Weeks    Status On-going    Target Date 07/05/21      OT LONG TERM GOAL #3   Title Patient to be independent in use of pneumatic compression pump to supplement daytime compression to maintain circumference and decrease future infection in right upper extremity    Baseline Assess if patient can be fitted with a pneumatic pump has no knowledge would not be able to tolerate a nighttime garment because of mental health would recommend the pump to use morning and evening. 10/15/2021: using pump consistently    Time 6    Period Weeks    Status Partially Met    Target Date 07/05/21                    Plan - 11/06/21 1705     Clinical Impression Statement Patient  referred by Dr. Grayland Ormond for right upper extremity lymphedema.  Patient is  s/p right mastectomy with 11-13 lymph nodes removed.  Pt was seen by this OT in May 23 for bandaging of R UE to decrease lymphedema and circumference in R UE - and fitted with daytime compression sleeve and glove as well as pump. Pt return last week with increase of circumference in R UE lymphedema.  Pt wore some of the tubigrip compression and glove since last week. Pt return 10/15/21 with measurement taken in bilateral UE and compare to May 23. Pt did loose weight since then.  Pt R UE increase compare to May 23 by  3 cm in upper arm and elbow. Forearm  increase 5-6 cm , wrist 5 cm and hand 1.5 cm.  Discussed significant increases since May 23 with elbow to wrist the most.  Patient's right upper extremity was bandaged since beginning of last week.  Patient arrived 1 day with bandages loosened up at the elbow causing significant increase at the proximal forearm.  At the moment having trouble for patient to keep bandages on or wrapped with correct gradient and compression to decrease right upper extremity circumference to fit back into her compression daytimes sleeve.  Pt this date again arrive with bandages loosen again and not gradient bandages. Forearm still increase in circumference.  MLD and fibrotic tech done- prior to redoing bandages - but this date done Komprex foam circular over prox forearm under Rosidal foam from hand to axilla instead of Artiflex. Brought pt's caretaker in this date to ed  her on doing bandages if needed - but if possible not the next 48 hrs but need it done over the weekend.  Reminded patient several times during session not to scratch her arm but to rub it.  Also told patient to pad lightly her arm and put ice pack over her the bandages at home if it itches.  aPatient can benefit from skilled OT services to decongest right upper extremity to keep  lymphedema under control with appropriate compression garments and a home program- to prevent future cellulitis issues.    OT Occupational Profile and History Problem Focused Assessment - Including review of records relating to presenting problem    Occupational performance deficits (Please refer to evaluation for details): ADL's;IADL's;Leisure;Play    Body Structure / Function / Physical Skills ADL;Decreased knowledge of precautions;Flexibility;Scar mobility;IADL;Edema    Rehab Potential Good    Clinical Decision Making Limited treatment options, no task modification necessary    Comorbidities Affecting Occupational Performance: None    Modification or Assistance to Complete Evaluation  No modification of tasks or assist necessary to complete eval    OT Frequency 3x / week    OT Duration 6 weeks    OT Treatment/Interventions Self-care/ADL training;Manual lymph drainage;DME and/or AE instruction;Compression bandaging;Scar mobilization;Manual Therapy;Patient/family education    Consulted and Agree with Plan of Care Patient             Patient will benefit from skilled therapeutic intervention in order to improve the following deficits and impairments:   Body Structure / Function / Physical Skills: ADL, Decreased knowledge of precautions, Flexibility, Scar mobility, IADL, Edema       Visit Diagnosis: Postmastectomy lymphedema syndrome  Scar condition and fibrosis of skin    Problem List Patient Active Problem List   Diagnosis Date Noted   Major depressive disorder, recurrent severe without psychotic features ([place]) 04/21/2021   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[place]) 01/28/2021   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   Medical non-compliance 08/17/2020   Hepatic abscess 01/10/2016   History of diabetes mellitus, type II 11/04/2014   History of essential hypertension 11/04/2014   Intra-abdominal and pelvic swelling, mass and lump, unspecified site 11/03/2013    Rosalyn Gess, OTR/L,CLT 11/06/2021, 5:56 PM  [facility] [address], [place], [address] Phone: [phone]   Fax:  [phone]  Name: MAALLE STARRETT MRN: [id number] Date of Birth: Sep 29, 1950

## 2021-11-08 ENCOUNTER — Ambulatory Visit: Payer: Medicare HMO | Admitting: Occupational Therapy

## 2021-11-08 DIAGNOSIS — I972 Postmastectomy lymphedema syndrome: Secondary | ICD-10-CM | POA: Diagnosis not present

## 2021-11-08 DIAGNOSIS — L905 Scar conditions and fibrosis of skin: Secondary | ICD-10-CM

## 2021-11-08 NOTE — Therapy (Signed)
[facility] [address], [place], [address] Phone: [phone]   Fax:  [phone]  Occupational Therapy Treatment  Patient Details  Name: Elizabeth [name] MRN: [id number] Date of Birth: 1950/05/16 Referring Provider (OT): Dr Grayland Ormond   Encounter Date: 11/08/2021   OT End of Session - 11/08/21 1524     Visit Number 12    Number of Visits 18    Date for OT Re-Evaluation 11/26/21    OT Start Time 1450    OT Stop Time 1524    OT Time Calculation (min) 34 min    Activity Tolerance Patient tolerated treatment well    Behavior During Therapy [facility] for tasks assessed/performed             Past Medical History:  Diagnosis Date   Acute cholecystitis 2016   Anemia    Anemia 2016   Anxiety    panic attacks   Anxiety and depression    Bipolar 1 disorder (HCC)    Bowel obstruction ([place]) 2017   Common bile duct dilatation 2015   Dyspnea    Edema    Fibromyalgia    GI bleed 2017   History of kidney stones 01/21/2009   HSV-1 infection    Hyperglycemia    Hyperlipidemia    Hypokalemia    Hypothyroidism    Liver cyst    Malignant neoplasm of right female breast, unspecified estrogen receptor status, unspecified site of breast ([place])    Memory loss    Osteopenia    Panic disorder    Schizophrenia ([place])    Ventral hernia     Past Surgical History:  Procedure Laterality Date   ABDOMINAL HYSTERECTOMY     BREAST BIOPSY Right 01/19/2021   [place] Bx, ribbon, path pending   BREAST BIOPSY Right 01/19/2021   [place] Bx, Venus, path pending   BREAST BIOPSY Right 01/19/2021   [place] Bx, heart, path pending   BREAST BIOPSY Right 01/19/2021   [place] BX Axilla, Coil, path pending   CESAREAN SECTION     CHOLECYSTECTOMY OPEN  11/17/2013   with repair of bile duct-Dr. Marina Gravel   MASTECTOMY MODIFIED RADICAL Right 03/02/2021   Procedure: MASTECTOMY MODIFIED RADICAL;  Surgeon: Benjamine Sprague, DO;  Location: [facility] ORS;  Service:  General;  Laterality: Right;   OPEN REDUCTION INTERNAL FIXATION (ORIF) DISTAL RADIAL FRACTURE Right 12/20/2014   Procedure: OPEN REDUCTION INTERNAL FIXATION (ORIF) RIGHT DISTAL RADIAL FRACTURE;  Surgeon: Renette Butters, MD;  Location: [facility];  Service: Orthopedics;  Laterality: Right;   STOMACH SURGERY      There were no vitals filed for this visit.   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     PLAN:       1. 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  She underwent full mastectomy on December 30, 2021.  Despite recommendation of adjuvant Adriamycin and Cytoxan followed by Taxol, patient continues to be adamant that she will not undergo chemotherapy.  She has highly hesitant to pursue adjuvant XRT as well.  Given her ER/PR  status of her tumor, it was recommended patient also take letrozole which she is considering.  No intervention is needed at this time.  Patient will follow-up in 3 weeks for further evaluation and additional treatment planning.   2. Postoperative pain: Patient was given a prescription for hydrocodone today.  3. Lymphedema: Patient was given a referral to lymphedema clinic as well has an appointment with occupational therapy.    Patient Stated Goals To Get My Right Arm Smaller and Prevent Infection    Currently in Pain? No/denies                 LYMPHEDEMA/ONCOLOGY QUESTIONNAIRE - 11/08/21 0001       Right Upper Extremity Lymphedema   15 cm Proximal to Olecranon Process 28 cm    10 cm Proximal to Olecranon Process 28 cm    Olecranon Process 27 cm    15 cm  Proximal to Ulnar Styloid Process 27.5 cm    10 cm Proximal to Ulnar Styloid Process 21.5 cm    Just Proximal to Ulnar Styloid Process 15 cm    Across Hand at [employer] 17 cm           Distal forearm decrease 4 cm    Patient arrived with bandages  in place since 48 hrs ago. Measurements taken of circumference see flowsheet.     Patient educated on lymphatic bandaging for right upper extremity again -and motivated - to decrease infection risk and she done great in May 2023  She she did had short sleeves on this date and reminded patient again and caretaker that short sleeves would not disrupt the bandages.       Patient did skincare to arm in  bathroom Applied Eucerin lotion and cortisone ointment that son bought in the past on elbow and forearm Applied Isotoner glove soft stockinette applied to right upper extremity  Komprex foam circulare around elbow and prox forearm under  Rosidal foam from hand - circular up overlapping 50% to upper arm   6 cm short stretch bandage 3 cycles around the wrist through thumb alternating and up to forearm figure 8's 10 cm at wrist 1 time through the thumb overlapping 50% up to proximal to axilla  Topper of Tubigrip F on upper arm  Caretaker did video about how to bandage  - will redo bandages in 48 hrs and follow up with me Tues                    OT Education - 11/08/21 1523     Education Details bandaging and POC    Person(s) Educated Patient;Caregiver(s)    Methods Explanation;Demonstration;Verbal cues;Handout;Tactile cues    Comprehension Verbal cues required;Tactile cues required;Returned demonstration;Verbalized understanding                 OT Long Term Goals - 10/18/21 1515       OT LONG TERM GOAL #1   Title Patient to be independent in home program to tolerate and wearing of compression bandaging to decrease circumference in the right upper extremity to get measured for compression garments    Baseline  Patient had never done bandaging before and showing right upper extremity and thoracic lymphedema.  Upper arm increased by 3 cm and elbow and forearm 3 to 4 cm and wrist 2.5 cm.  10/15/2021:  Pt has not been wearing daytime sleeve for a while, increase in measurements.    Time 2    Period Weeks  Status On-going    Target Date 10/29/21      OT LONG TERM GOAL #2   Title Patient to be independent and wearing of compression garment to decrease and maintain right upper extremity circumference.    Baseline Patient increased by 3 to 4 cm in right upper extremity compared to left.  Has no pump or compression done in the past.  No knowledge.  10/15/21:  reassessment of measurements, increases since last session and has not worn sleeve.    Time 6    Period Weeks    Status On-going    Target Date 07/05/21      OT LONG TERM GOAL #3   Title Patient to be independent in use of pneumatic compression pump to supplement daytime compression to maintain circumference and decrease future infection in right upper extremity    Baseline Assess if patient can be fitted with a pneumatic pump has no knowledge would not be able to tolerate a nighttime garment because of mental health would recommend the pump to use morning and evening. 10/15/2021: using pump consistently    Time 6    Period Weeks    Status Partially Met    Target Date 07/05/21                   Plan - 11/08/21 1524     Clinical Impression Statement Patient  referred by Dr. Grayland Ormond for right upper extremity lymphedema.  Patient is  s/p right mastectomy with 11-13 lymph nodes removed.  Pt was seen by this OT in May 23 for bandaging of R UE to decrease lymphedema and circumference in R UE - and fitted with daytime compression sleeve and glove as well as pump. Pt return last week with increase of circumference in R UE lymphedema.  Pt wore some of the tubigrip compression and glove since last week. Pt return 10/15/21 with measurement taken in  bilateral UE and compare to May 23. Pt did loose weight since then.  Pt R UE increase compare to May 23 by  3 cm in upper arm and elbow. Forearm  increase 5-6 cm , wrist 5 cm and hand 1.5 cm.  Discussed significant increases since May 23 with elbow to wrist the most.  Patient's right upper extremity was bandaged since beginning of last week.  Patient arrived 1 day with bandages loosened up at the elbow causing significant increase at the proximal forearm.  At the moment having trouble for patient to keep bandages on or wrapped with correct gradient and compression to decrease right upper extremity circumference to fit back into her compression daytimes sleeve.  Since last visit - pt kept bandages on and caretaker with her - Caretaker can redo bandages over the weekend - did video- and pt did decrease greatly in forearm today by 4 cm - but proximal forearm and elbow still increase - goal is for pt to be able to fit in her compression sleeve next week. Reminded patient several times during session not to scratch her arm but to rub it.  Also told patient to pad lightly her arm and put ice pack over her the bandages at home if it itches.  Patient can benefit from skilled OT services to decongest right upper extremity to keep lymphedema under control with appropriate compression garments and a home program- to prevent future cellulitis issues.    OT Occupational Profile and History Problem Focused Assessment - Including review of records relating to presenting problem  Occupational performance deficits (Please refer to evaluation for details): ADL's;IADL's;Leisure;Play    Body Structure / Function / Physical Skills ADL;Decreased knowledge of precautions;Flexibility;Scar mobility;IADL;Edema    Rehab Potential Good    Clinical Decision Making Limited treatment options, no task modification necessary    Comorbidities Affecting Occupational Performance: None    Modification or Assistance to Complete Evaluation  No  modification of tasks or assist necessary to complete eval    OT Frequency 3x / week    OT Duration 6 weeks    OT Treatment/Interventions Self-care/ADL training;Manual lymph drainage;DME and/or AE instruction;Compression bandaging;Scar mobilization;Manual Therapy;Patient/family education    Consulted and Agree with Plan of Care Patient             Patient will benefit from skilled therapeutic intervention in order to improve the following deficits and impairments:   Body Structure / Function / Physical Skills: ADL, Decreased knowledge of precautions, Flexibility, Scar mobility, IADL, Edema       Visit Diagnosis: Postmastectomy lymphedema syndrome  Scar condition and fibrosis of skin    Problem List Patient Active Problem List   Diagnosis Date Noted   Major depressive disorder, recurrent severe without psychotic features ([place]) 04/21/2021   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[place]) 01/28/2021   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   Medical non-compliance 08/17/2020   Hepatic abscess 01/10/2016   History of diabetes mellitus, type II 11/04/2014   History of essential hypertension 11/04/2014   Intra-abdominal and pelvic swelling, mass and lump, unspecified site 11/03/2013    Rosalyn Gess, OTR/L,CLT 11/08/2021, 3:27 PM  [facility] [address] [place], [address] Phone: [phone]   Fax:  [phone]  Name: EVELIA WASKEY MRN: [id number] Date of Birth: 09/02/1950

## 2021-11-13 ENCOUNTER — Ambulatory Visit: Payer: Medicare HMO | Admitting: Occupational Therapy

## 2021-11-13 ENCOUNTER — Encounter: Payer: Self-pay | Admitting: Occupational Therapy

## 2021-11-13 DIAGNOSIS — I972 Postmastectomy lymphedema syndrome: Secondary | ICD-10-CM

## 2021-11-13 DIAGNOSIS — L905 Scar conditions and fibrosis of skin: Secondary | ICD-10-CM

## 2021-11-13 NOTE — Therapy (Signed)
[place] [facility] [address], [place], [address] Phone: [phone]   Fax:  [phone]  Occupational Therapy Treatment  Patient Details  Name: Elizabeth [name] MRN: [id number] Date of Birth: 1950-03-19 Referring Provider (OT): Dr Grayland Ormond   Encounter Date: 11/13/2021   OT End of Session - 11/13/21 1704     Visit Number 13    Number of Visits 18    Date for OT Re-Evaluation 11/26/21    OT Start Time 1704    OT Stop Time 1750    OT Time Calculation (min) 46 min    Activity Tolerance Patient tolerated treatment well    Behavior During Therapy [facility] for tasks assessed/performed             Past Medical History:  Diagnosis Date   Acute cholecystitis 2016   Anemia    Anemia 2016   Anxiety    panic attacks   Anxiety and depression    Bipolar 1 disorder (HCC)    Bowel obstruction ([place]) 2017   Common bile duct dilatation 2015   Dyspnea    Edema    Fibromyalgia    GI bleed 2017   History of kidney stones 01/21/2009   HSV-1 infection    Hyperglycemia    Hyperlipidemia    Hypokalemia    Hypothyroidism    Liver cyst    Malignant neoplasm of right female breast, unspecified estrogen receptor status, unspecified site of breast ([place])    Memory loss    Osteopenia    Panic disorder    Schizophrenia ([place])    Ventral hernia     Past Surgical History:  Procedure Laterality Date   ABDOMINAL HYSTERECTOMY     BREAST BIOPSY Right 01/19/2021   [place] Bx, ribbon, path pending   BREAST BIOPSY Right 01/19/2021   [place] Bx, Venus, path pending   BREAST BIOPSY Right 01/19/2021   [place] Bx, heart, path pending   BREAST BIOPSY Right 01/19/2021   [place] BX Axilla, Coil, path pending   CESAREAN SECTION     CHOLECYSTECTOMY OPEN  11/17/2013   with repair of bile duct-Dr. Marina Gravel   MASTECTOMY MODIFIED RADICAL Right 03/02/2021   Procedure: MASTECTOMY MODIFIED RADICAL;  Surgeon: Benjamine Sprague, DO;  Location: [facility] ORS;  Service:  General;  Laterality: Right;   OPEN REDUCTION INTERNAL FIXATION (ORIF) DISTAL RADIAL FRACTURE Right 12/20/2014   Procedure: OPEN REDUCTION INTERNAL FIXATION (ORIF) RIGHT DISTAL RADIAL FRACTURE;  Surgeon: Renette Butters, MD;  Location: [place];  Service: Orthopedics;  Laterality: Right;   STOMACH SURGERY      There were no vitals filed for this visit.   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     PLAN:       1. 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  She underwent full mastectomy on December 30, 2021.  Despite recommendation of adjuvant Adriamycin and Cytoxan followed by Taxol, patient continues to be adamant that she will not undergo chemotherapy.  She has highly hesitant to pursue adjuvant XRT as well.  Given her ER/PR  status of her tumor, it was recommended patient also take letrozole which she is considering.  No intervention is needed at this time.  Patient will follow-up in 3 weeks for further evaluation and additional treatment planning.   2. Postoperative pain: Patient was given a prescription for hydrocodone today.  3. Lymphedema: Patient was given a referral to lymphedema clinic as well has an appointment with occupational therapy.    Patient Stated Goals To Get My Right Arm Smaller and Prevent Infection    Currently in Pain? No/denies                 LYMPHEDEMA/ONCOLOGY QUESTIONNAIRE - 11/13/21 0001       Right Upper Extremity Lymphedema   10 cm Proximal to Olecranon Process 28.3 cm    Olecranon Process 28 cm    15 cm Proximal to Ulnar Styloid Process 25.5 cm    10 cm  Proximal to Ulnar Styloid Process 19.2 cm    Just Proximal to Ulnar Styloid Process 14.7 cm    Across Hand at [employer] 17 cm                 Patient arrived with her bandages on R UE - caregiver redone her bandages past Sat and then upper arm part this afternoon.Pt had tubigrip rolled up at upper arm under bandages- and tap around upper arm - pt report arm itching  - reminded her again several times - pt fixated on itching every session At this time OT feels - her circumference decrease - and proximal forearm and elbow still increase but about 3-4 cm - and with pt itching and having caregiver 3 x wk -  Will have pt switch to using her pump 2 x day 45 min am and pm Review with pt donning and wearing of over-the-counter compression sleeve and glove on the right upper extremity.    Reviewed with patient wearing correctly compression sleeve.  As well as donning and doffing.   As well fitted Tubigrip E over isotoner glove from hand to axilla - review and had pt practice donning and wearing night time   Caregiver report pt not using pump 45 min - reinforce again importance of compliance of HEP - to prevent flare ups and needed to be bandage again   Pt can use lotion and anti itch ointment as needed   Caregiver present during education- Pt to follow up with me in week               OT Education - 11/13/21 1704     Education Details use of pump- donning and wearing of compression- and bandaging with caregiver    Person(s) Educated Patient;Caregiver(s)    Methods Explanation;Demonstration;Verbal cues;Handout;Tactile cues    Comprehension Verbal cues required;Tactile cues required;Returned demonstration;Verbalized understanding                 OT Long Term Goals - 10/18/21 1515       OT LONG TERM GOAL #1   Title Patient to be independent in home program to tolerate and wearing of compression bandaging to decrease circumference in the right upper extremity to  get measured for compression garments    Baseline Patient had never done bandaging before and showing right upper extremity and thoracic lymphedema.  Upper arm increased by 3 cm and elbow and forearm 3 to 4 cm and wrist 2.5 cm.  10/15/2021:  Pt has not been wearing daytime sleeve for a while, increase in  measurements.    Time 2    Period Weeks    Status On-going    Target Date 10/29/21      OT LONG TERM GOAL #2   Title Patient to be independent and wearing of compression garment to decrease and maintain right upper extremity circumference.    Baseline Patient increased by 3 to 4 cm in right upper extremity compared to left.  Has no pump or compression done in the past.  No knowledge.  10/15/21:  reassessment of measurements, increases since last session and has not worn sleeve.    Time 6    Period Weeks    Status On-going    Target Date 07/05/21      OT LONG TERM GOAL #3   Title Patient to be independent in use of pneumatic compression pump to supplement daytime compression to maintain circumference and decrease future infection in right upper extremity    Baseline Assess if patient can be fitted with a pneumatic pump has no knowledge would not be able to tolerate a nighttime garment because of mental health would recommend the pump to use morning and evening. 10/15/2021: using pump consistently    Time 6    Period Weeks    Status Partially Met    Target Date 07/05/21                   Plan - 11/13/21 1704     Clinical Impression Statement Patient  referred by Dr. Grayland Ormond for right upper extremity lymphedema.  Patient is  s/p right mastectomy with 11-13 lymph nodes removed.  Pt was seen by this OT in May 23 for bandaging of R UE to decrease lymphedema and circumference in R UE - and fitted with daytime compression sleeve and glove as well as pump. Pt return last week with increase of circumference in R UE lymphedema.  Pt wore some of the tubigrip compression and glove since last  week. Pt return 10/15/21 with measurement taken in bilateral UE and compare to May 23. Pt did loose weight since then.  Pt R UE increase compare to May 23 by  3 cm in upper arm and elbow. Forearm  increase 5-6 cm , wrist 5 cm and hand 1.5 cm.  Discussed significant increases since May 23 with elbow to wrist the most.Pt R UE circumference decrease - proximal forearm and elbow still increase but at this stage pt would benefit from using her pump 2 x day 45 min - wearing daytime compression and tubigrip E with isotoner glove night time- reviewed with pt and caregiver HEP. Practice with pt donning of compression sleeve herself. As well as compliance of HEP. If needed caregiver can bandage her again on Thursday if needed. Pt will follow up with me again next week to assess if pt can maintain her R UE lymphedema circumference with homeprogram- with pt having caregiver 3 x wk. Reminded patient several times during session not to scratch her arm but to rub it.  Also told patient to pad lightly her arm and put ice pack if needed.    OT Occupational Profile and History Problem Focused Assessment - Including review of records relating to presenting problem    Occupational performance deficits (Please refer to evaluation for details): ADL's;IADL's;Leisure;Play    Body Structure / Function / Physical Skills ADL;Decreased knowledge of precautions;Flexibility;Scar mobility;IADL;Edema    Rehab Potential Good    Clinical Decision Making Limited treatment options, no task modification necessary    Comorbidities  Affecting Occupational Performance: None    Modification or Assistance to Complete Evaluation  No modification of tasks or assist necessary to complete eval    OT Frequency 3x / week    OT Duration 6 weeks    OT Treatment/Interventions Self-care/ADL training;Manual lymph drainage;DME and/or AE instruction;Compression bandaging;Scar mobilization;Manual Therapy;Patient/family education    Consulted and Agree with Plan  of Care Patient             Patient will benefit from skilled therapeutic intervention in order to improve the following deficits and impairments:   Body Structure / Function / Physical Skills: ADL, Decreased knowledge of precautions, Flexibility, Scar mobility, IADL, Edema       Visit Diagnosis: Postmastectomy lymphedema syndrome  Scar condition and fibrosis of skin    Problem List Patient Active Problem List   Diagnosis Date Noted   Major depressive disorder, recurrent severe without psychotic features ([place]) 04/21/2021   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[place]) 01/28/2021   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   Medical non-compliance 08/17/2020   Hepatic abscess 01/10/2016   History of diabetes mellitus, type II 11/04/2014   History of essential hypertension 11/04/2014   Intra-abdominal and pelvic swelling, mass and lump, unspecified site 11/03/2013    Rosalyn Gess, OTR/L,CLT 11/13/2021, 8:50 PM  [place] [facility] [address], [place], [address] Phone: [phone]   Fax:  [phone]  Name: Elizabeth [name] MRN: [id number] Date of Birth: 05/21/1950

## 2021-11-20 ENCOUNTER — Ambulatory Visit: Payer: Medicare HMO | Admitting: Occupational Therapy

## 2021-11-20 ENCOUNTER — Other Ambulatory Visit: Payer: Self-pay | Admitting: Surgery

## 2021-11-20 DIAGNOSIS — L905 Scar conditions and fibrosis of skin: Secondary | ICD-10-CM

## 2021-11-20 DIAGNOSIS — Z1231 Encounter for screening mammogram for malignant neoplasm of breast: Secondary | ICD-10-CM

## 2021-11-20 DIAGNOSIS — I972 Postmastectomy lymphedema syndrome: Secondary | ICD-10-CM | POA: Diagnosis not present

## 2021-11-20 NOTE — Therapy (Signed)
[place] [facility] [address] [place], [place], [address] Phone: [phone]   Fax:  [phone]  Occupational Therapy Treatment  Patient Details  Name: Elizabeth [name] MRN: [id number] Date of Birth: [birth year] Referring Provider (OT): Dr Grayland Ormond   Encounter Date: 11/20/2021   OT End of Session - 11/20/21 1947     Visit Number 14    Number of Visits 18    Date for OT Re-Evaluation 11/26/21    OT Start Time 1710    OT Stop Time 1735    OT Time Calculation (min) 25 min    Activity Tolerance Patient tolerated treatment well    Behavior During Therapy [facility] for tasks assessed/performed             Past Medical History:  Diagnosis Date   Acute cholecystitis 2016   Anemia    Anemia 2016   Anxiety    panic attacks   Anxiety and depression    Bipolar 1 disorder (HCC)    Bowel obstruction ([place]) 2017   Common bile duct dilatation 2015   Dyspnea    Edema    Fibromyalgia    GI bleed 2017   History of kidney stones 01/21/2009   HSV-1 infection    Hyperglycemia    Hyperlipidemia    Hypokalemia    Hypothyroidism    Liver cyst    Malignant neoplasm of right female breast, unspecified estrogen receptor status, unspecified site of breast ([place])    Memory loss    Osteopenia    Panic disorder    Schizophrenia ([place])    Ventral hernia     Past Surgical History:  Procedure Laterality Date   ABDOMINAL HYSTERECTOMY     BREAST BIOPSY Right 01/19/2021   [place] Bx, ribbon, path pending   BREAST BIOPSY Right 01/19/2021   [place] Bx, Venus, path pending   BREAST BIOPSY Right 01/19/2021   [place] Bx, heart, path pending   BREAST BIOPSY Right 01/19/2021   [place] BX Axilla, Coil, path pending   CESAREAN SECTION     CHOLECYSTECTOMY OPEN  11/17/2013   with repair of bile duct-Dr. Marina Gravel   MASTECTOMY MODIFIED RADICAL Right 03/02/2021   Procedure: MASTECTOMY MODIFIED RADICAL;  Surgeon: Benjamine Sprague, DO;  Location: [facility] ORS;  Service:  General;  Laterality: Right;   OPEN REDUCTION INTERNAL FIXATION (ORIF) DISTAL RADIAL FRACTURE Right 12/20/2014   Procedure: OPEN REDUCTION INTERNAL FIXATION (ORIF) RIGHT DISTAL RADIAL FRACTURE;  Surgeon: Renette Butters, MD;  Location: [facility];  Service: Orthopedics;  Laterality: Right;   STOMACH SURGERY      There were no vitals filed for this visit.   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     PLAN:       1. 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  She underwent full mastectomy on December 30, 2021.  Despite recommendation of adjuvant Adriamycin and Cytoxan followed by Taxol, patient continues to be adamant that she will not undergo chemotherapy.  She has highly hesitant  to pursue adjuvant XRT as well.  Given her ER/PR status of her tumor, it was recommended patient also take letrozole which she is considering.  No intervention is needed at this time.  Patient will follow-up in 3 weeks for further evaluation and additional treatment planning.   2. Postoperative pain: Patient was given a prescription for hydrocodone today.  3. Lymphedema: Patient was given a referral to lymphedema clinic as well has an appointment with occupational therapy.    Patient Stated Goals To Get My Right Arm Smaller and Prevent Infection    Currently in Pain? No/denies                 LYMPHEDEMA/ONCOLOGY QUESTIONNAIRE - 11/20/21 0001       Right Upper Extremity Lymphedema   15 cm Proximal to Olecranon Process 28 cm    10 cm Proximal to Olecranon Process 28.5 cm     Olecranon Process 28 cm    15 cm Proximal to Ulnar Styloid Process 28 cm    10 cm Proximal to Ulnar Styloid Process 24 cm    Just Proximal to Ulnar Styloid Process 16.5 cm    Across Hand at [employer] 16.5 cm            Pt arrive with her daytime compression sleeve on but only 1/2 way- reported she used pump only 1 x day for 35 min - set for 35 min  Will get Rep to contact pt about fit and increase time  Pt wrist and forearm increase greatly again - Pt report she wore her daysleeve at night time too- since appear Sat  Caretaker report arm looked okay on Sat REINFORCE with pt again - tan compression only for daytime use - NOT night time- can cause tourniquet Pt in agreement to bandager her arm for 12 hrs until tomorrow am to decrease forearm and wrist some    Applied Eucerin lotion and cortisone ointment that son bought in the past on elbow and forearm Applied Isotoner glove soft stockinette applied to right upper extremity  Komprex foam circulare around forearm Rosidal foam from hand - circular up overlapping 50% to upper arm   6 cm short stretch bandage 3 cycles around the wrist through thumb alternating and up to forearm figure 8's 10 cm at wrist 1 time through the thumb overlapping 50% up to proximal to axilla  Topper of Tubigrip F on upper arm  Caretaker did video about how to bandage in past -  Caretaker will be then Thursday again               OT Education - 11/20/21 1947     Education Details use of pump- donning and wearing of compression- and bandaging with pt and  caregiver    Person(s) Educated Patient;Caregiver(s)    Methods Explanation;Demonstration;Verbal cues;Handout;Tactile cues    Comprehension Verbal cues required;Tactile cues required;Returned demonstration;Verbalized understanding                 OT Long Term Goals - 10/18/21 1515       OT LONG TERM GOAL #1   Title Patient to be independent in home program to tolerate and  wearing of compression bandaging to decrease circumference in the right upper extremity to get measured for compression garments    Baseline Patient had never done bandaging before and showing right upper extremity and thoracic lymphedema.  Upper arm increased by 3 cm and elbow and forearm 3 to 4 cm and wrist  2.5 cm.  10/15/2021:  Pt has not been wearing daytime sleeve for a while, increase in measurements.    Time 2    Period Weeks    Status On-going    Target Date 10/29/21      OT LONG TERM GOAL #2   Title Patient to be independent and wearing of compression garment to decrease and maintain right upper extremity circumference.    Baseline Patient increased by 3 to 4 cm in right upper extremity compared to left.  Has no pump or compression done in the past.  No knowledge.  10/15/21:  reassessment of measurements, increases since last session and has not worn sleeve.    Time 6    Period Weeks    Status On-going    Target Date 07/05/21      OT LONG TERM GOAL #3   Title Patient to be independent in use of pneumatic compression pump to supplement daytime compression to maintain circumference and decrease future infection in right upper extremity    Baseline Assess if patient can be fitted with a pneumatic pump has no knowledge would not be able to tolerate a nighttime garment because of mental health would recommend the pump to use morning and evening. 10/15/2021: using pump consistently    Time 6    Period Weeks    Status Partially Met    Target Date 07/05/21                   Plan - 11/20/21 1948     Clinical Impression Statement Patient  referred by Dr. Grayland Ormond for right upper extremity lymphedema.  Patient is  s/p right mastectomy with 11-13 lymph nodes removed.  Pt was seen by this OT in May 23 for bandaging of R UE to decrease lymphedema and circumference in R UE - and fitted with daytime compression sleeve and glove as well as pump. Pt return in Sept with increase of  circumference in R UE lymphedema.  Attempted pt to use her pump and light compression to decrease but was unable - then bandage pt until last week but pt not complaint with keeping bandages in place, itching and pt scratching her arm.  Switch pt to using her pump 2 x day and daysleeve with tubigrip night time -but this date pt arrive with wrist and forearm increase more than when se started- caretaker says arm still looked okay Sat-but appear pt done pump only one time day for 35 min and slept couple of nights with her daysleeve - that can cause tourniquet. At this time had discussion with pt about her HEP -and importance. WIll get Rep to contact her to assess pump fit and increase timeto 45 min to hour - and to use 2 x day for 45 min to hour - and then Daysleeve NOT at night time and tubigrip at night time- Pt will follow up with me again next week to assess if pt can maintain her R UE lymphedema circumference with homeprogram- with pt having caregiver 3 x wk. Provided pt writted HEP.    OT Occupational Profile and History Problem Focused Assessment - Including review of records relating to presenting problem    Occupational performance deficits (Please refer to evaluation for details): ADL's;IADL's;Leisure;Play    Body Structure / Function / Physical Skills ADL;Decreased knowledge of precautions;Flexibility;Scar mobility;IADL;Edema    Rehab Potential Good    Clinical Decision Making Limited treatment options, no task modification necessary    Comorbidities Affecting Occupational Performance:  None    Modification or Assistance to Complete Evaluation  No modification of tasks or assist necessary to complete eval    OT Frequency 3x / week    OT Duration 6 weeks    OT Treatment/Interventions Self-care/ADL training;Manual lymph drainage;DME and/or AE instruction;Compression bandaging;Scar mobilization;Manual Therapy;Patient/family education    Consulted and Agree with Plan of Care Patient              Patient will benefit from skilled therapeutic intervention in order to improve the following deficits and impairments:   Body Structure / Function / Physical Skills: ADL, Decreased knowledge of precautions, Flexibility, Scar mobility, IADL, Edema       Visit Diagnosis: Postmastectomy lymphedema syndrome  Scar condition and fibrosis of skin    Problem List Patient Active Problem List   Diagnosis Date Noted   Major depressive disorder, recurrent severe without psychotic features ([place]) 04/21/2021   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[place]) 01/28/2021   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   Medical non-compliance 08/17/2020   Hepatic abscess 01/10/2016   History of diabetes mellitus, type II 11/04/2014   History of essential hypertension 11/04/2014   Intra-abdominal and pelvic swelling, mass and lump, unspecified site 11/03/2013    Rosalyn Gess, OTR/L,CLT 11/20/2021, 7:55 PM  [place] [facility] [address] [place], [address] Phone: [phone]   Fax:  [phone]  Name: Elizabeth [name] MRN: [id number] Date of Birth: [birth year]

## 2021-11-27 ENCOUNTER — Ambulatory Visit: Payer: Medicare HMO | Attending: Oncology | Admitting: Occupational Therapy

## 2021-11-27 DIAGNOSIS — I972 Postmastectomy lymphedema syndrome: Secondary | ICD-10-CM | POA: Diagnosis not present

## 2021-11-27 DIAGNOSIS — L905 Scar conditions and fibrosis of skin: Secondary | ICD-10-CM | POA: Diagnosis present

## 2021-11-27 NOTE — Therapy (Signed)
[facility] [address] [place], [place], [address] Phone: [phone]   Fax:  [phone]  Occupational Therapy Treatment  Patient Details  Name: Elizabeth [name] MRN: [id number] Date of Birth: 04/13/1950 Referring Provider (OT): Dr Finnegan   Encounter Date: 11/27/2021   OT End of Session - 11/27/21 1829     Visit Number 15    Number of Visits 18    Date for OT Re-Evaluation 01/08/22    OT Start Time 1703    OT Stop Time 1728    OT Time Calculation (min) 25 min    Activity Tolerance Patient tolerated treatment well    Behavior During Therapy WFL for tasks assessed/performed             Past Medical History:  Diagnosis Date   Acute cholecystitis 2016   Anemia    Anemia 2016   Anxiety    panic attacks   Anxiety and depression    Bipolar 1 disorder (HCC)    Bowel obstruction (HCC) 2017   Common bile duct dilatation 2015   Dyspnea    Edema    Fibromyalgia    GI bleed 2017   History of kidney stones 01/21/2009   HSV-1 infection    Hyperglycemia    Hyperlipidemia    Hypokalemia    Hypothyroidism    Liver cyst    Malignant neoplasm of right female breast, unspecified estrogen receptor status, unspecified site of breast (HCC)    Memory loss    Osteopenia    Panic disorder    Schizophrenia (HCC)    Ventral hernia     Past Surgical History:  Procedure Laterality Date   ABDOMINAL HYSTERECTOMY     BREAST BIOPSY Right 01/19/2021   US Bx, ribbon, path pending   BREAST BIOPSY Right 01/19/2021   US Bx, Venus, path pending   BREAST BIOPSY Right 01/19/2021   US Bx, heart, path pending   BREAST BIOPSY Right 01/19/2021   US BX Axilla, Coil, path pending   CESAREAN SECTION     CHOLECYSTECTOMY OPEN  11/17/2013   with repair of bile duct-Dr. Bird   MASTECTOMY MODIFIED RADICAL Right 03/02/2021   Procedure: MASTECTOMY MODIFIED RADICAL;  Surgeon: Sakai, Isami, DO;  Location: [facility]S;  Service:  General;  Laterality: Right;   OPEN REDUCTION INTERNAL FIXATION (ORIF) DISTAL RADIAL FRACTURE Right 12/20/2014   Procedure: OPEN REDUCTION INTERNAL FIXATION (ORIF) RIGHT DISTAL RADIAL FRACTURE;  Surgeon: Timothy D Murphy, MD;  Location:  SURGERY CENTER;  Service: Orthopedics;  Laterality: Right;   STOMACH SURGERY      There were no vitals filed for this visit.   Subjective Assessment - 11/27/21 1827     Subjective  I done the pump some - and sleeve some. 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     PLAN:       1. 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  She underwent full mastectomy on December 30, 2021.  Despite recommendation of adjuvant Adriamycin and   Cytoxan followed by Taxol, patient continues to be adamant that she will not undergo chemotherapy.  She has highly hesitant to pursue adjuvant XRT as well.  Given her ER/PR status of her tumor, it was recommended patient also take letrozole which she is considering.  No intervention is needed at this time.  Patient will follow-up in 3 weeks for further evaluation and additional treatment planning.   2. Postoperative pain: Patient was given a prescription for hydrocodone today.  3. Lymphedema: Patient was given a referral to lymphedema clinic as well has an appointment with occupational therapy.    Patient Stated Goals To Get My Right Arm Smaller and Prevent Infection    Currently in Pain? No/denies                 LYMPHEDEMA/ONCOLOGY QUESTIONNAIRE - 11/27/21 0001       Right Upper Extremity  Lymphedema   15 cm Proximal to Olecranon Process 28 cm    10 cm Proximal to Olecranon Process 28 cm    Olecranon Process 27 cm    15 cm Proximal to Ulnar Styloid Process 26.4 cm    10 cm Proximal to Ulnar Styloid Process 24.8 cm    Just Proximal to Ulnar Styloid Process 17 cm    Across Hand at [employer] 17.5 cm                         Pt arrive with her daytime compression sleeve on but only 1/2 way up the upper arm  Reported only using pump some - and 35 min at time Rep to contact pt /son about fit and to increase time  Pt wrist and forearm increased greatly last time - this date decrease some in the forearm Caretaker report she put pt in pump over the weekend and today before coming in Pt had no compression sleeve on when she got there. -    REINFORCE with pt again - tan compression only for daytime use - NOT night time- can cause tourniquet       Caretaker did video about how to bandage in past -  Reviewed with patient again donning daytime compression with more ease and wearing it correctly. Provided patient with using a small plastic bag over the hand to sleep at on and pushing into the wall for elbow extension.  Much more ease to don. Should be easier for patient to wearing it-I am putting in on in the morning. As caretaker to encourage patient to use pump. Patient to continue with home program for 3 weeks and follow-up.                      OT Education - 11/27/21 1829     Education Details use of pump- donning and wearing of compression- and bandaging with pt and  caregiver    Person(s) Educated Patient;Caregiver(s)    Methods Explanation;Demonstration;Verbal cues;Handout;Tactile cues    Comprehension Verbal cues required;Tactile cues required;Returned demonstration;Verbalized understanding                 OT Long Term Goals - 11/27/21 1842       OT LONG TERM GOAL #1   Title Patient to be independent in home program to tolerate  and wearing of compression bandaging to decrease circumference in the right upper extremity to get measured for compression garments    Baseline Patient had never done bandaging before and showing right upper extremity  and thoracic lymphedema.  Upper arm increased by 3 cm and elbow and forearm 3 to 4 cm and wrist 2.5 cm.  10/15/2021:  Pt has not been wearing daytime sleeve for a while, increase in measurements. NOW pt was decrease in circumference 2 wsk ago but pt unable to maintain circumference with homeprogram - not compliant ?    Time 3    Period Weeks    Status On-going    Target Date 12/18/21      OT LONG TERM GOAL #2   Title Patient to be independent and wearing of compression garment to decrease and maintain right upper extremity circumference.    Baseline Patient increased by 3 to 4 cm in right upper extremity compared to left.  Has no pump or compression done in the past.  No knowledge.  10/15/21:  reassessment of measurement increase since 2 wks ago - but decrease the lsat week - caretakers to help pt use her pump and wear sleeve correctly daytime - tubigrip at night time    Time 6    Period Weeks    Status On-going    Target Date 01/08/22      OT LONG TERM GOAL #3   Title Patient to be independent in use of pneumatic compression pump to supplement daytime compression to maintain circumference and decrease future infection in right upper extremity    Baseline Assess if patient can be fitted with a pneumatic pump has no knowledge would not be able to tolerate a nighttime garment because of mental health would recommend the pump to use morning and evening. NOW pt to using pump consistant - time is decrease to 35 min and ? correct compression -ask Rep to reassess fit    Time 6    Status On-going    Target Date 01/08/22                   Plan - 11/27/21 1830     Clinical Impression Statement Patient  referred by Dr. Grayland Ormond for right upper extremity lymphedema.  Patient is   s/p right mastectomy with 11-13 lymph nodes removed.  Pt was seen by this OT in May 23 for bandaging of R UE to decrease lymphedema and circumference in R UE - and fitted with daytime compression sleeve and glove as well as pump. Pt return in Sept with increase of circumference in R UE lymphedema.  Attempted patient with pump and compression at home but was unable to decongest.  Done bandages for a couple of week with good success but patient could not tolerate it as well and a lot of itching at the elbow.  Patient was set up with a home program again to use her pump twice a day with a light nighttime Tubigrip compression and a sleeve.  Patient has a hard time being compliant with program.  Patient's forearm increased greatly 2 weeks ago.  This past week patient was able to to decrease forearm little bit with the help of caretaker using the pump and wearing the daytime compression caretaker helps to put on. Did contact Rep  to assess pump fit and increase timeto 45 min to hour - and to use 2 x day for 45 min to hour - and then Daysleeve NOT at night time and tubigrip at night time. Did talk to son last week. Pt will follow up in 3 wks and will reassess- if decrease will get her measure for new daytime compression sleeve- her existing sleeve is  about 7 months old. Pt  to decrease or maintain her R UE lymphedema circumference with homeprogram- with pt having caregiver 3 x wk.    OT Occupational Profile and History Problem Focused Assessment - Including review of records relating to presenting problem    Occupational performance deficits (Please refer to evaluation for details): ADL's;IADL's;Leisure;Play    Body Structure / Function / Physical Skills ADL;Decreased knowledge of precautions;Flexibility;Scar mobility;IADL;Edema    Rehab Potential Good    Clinical Decision Making Limited treatment options, no task modification necessary    Comorbidities Affecting Occupational Performance: None    Modification or  Assistance to Complete Evaluation  No modification of tasks or assist necessary to complete eval    OT Frequency Biweekly    OT Duration 6 weeks    OT Treatment/Interventions Self-care/ADL training;Manual lymph drainage;DME and/or AE instruction;Compression bandaging;Scar mobilization;Manual Therapy;Patient/family education    Consulted and Agree with Plan of Care Patient             Patient will benefit from skilled therapeutic intervention in order to improve the following deficits and impairments:   Body Structure / Function / Physical Skills: ADL, Decreased knowledge of precautions, Flexibility, Scar mobility, IADL, Edema       Visit Diagnosis: Postmastectomy lymphedema syndrome    Problem List Patient Active Problem List   Diagnosis Date Noted   Major depressive disorder, recurrent severe without psychotic features (HCC) 04/21/2021   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HCC) 01/28/2021   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 [place], [place], [address] Phone: [phone]   Fax:  [phone]  Name: Illana O Elden MRN: [id number] Date of Birth: 01/02/1951  

## 2021-12-18 ENCOUNTER — Ambulatory Visit: Payer: Medicare HMO | Admitting: Occupational Therapy

## 2021-12-18 DIAGNOSIS — I972 Postmastectomy lymphedema syndrome: Secondary | ICD-10-CM

## 2021-12-18 DIAGNOSIS — L905 Scar conditions and fibrosis of skin: Secondary | ICD-10-CM

## 2021-12-18 NOTE — Therapy (Signed)
[facility] [address] [place], [place], [address] Phone: [phone]   Fax:  [phone]  Occupational Therapy Treatment  Patient Details  Name: Elizabeth [name] MRN: [id number] Date of Birth: 1950-08-23 Referring Provider (OT): Dr Grayland Ormond   Encounter Date: 12/18/2021   OT End of Session - 12/18/21 1831     Visit Number 16    Number of Visits 18    Date for OT Re-Evaluation 01/08/22    OT Start Time 1624    OT Stop Time 1648    OT Time Calculation (min) 24 min    Activity Tolerance Patient tolerated treatment well    Behavior During Therapy [facility] for tasks assessed/performed             Past Medical History:  Diagnosis Date   Acute cholecystitis 2016   Anemia    Anemia 2016   Anxiety    panic attacks   Anxiety and depression    Bipolar 1 disorder (HCC)    Bowel obstruction ([place]) 2017   Common bile duct dilatation 2015   Dyspnea    Edema    Fibromyalgia    GI bleed 2017   History of kidney stones 01/21/2009   HSV-1 infection    Hyperglycemia    Hyperlipidemia    Hypokalemia    Hypothyroidism    Liver cyst    Malignant neoplasm of right female breast, unspecified estrogen receptor status, unspecified site of breast ([place])    Memory loss    Osteopenia    Panic disorder    Schizophrenia ([place])    Ventral hernia     Past Surgical History:  Procedure Laterality Date   ABDOMINAL HYSTERECTOMY     BREAST BIOPSY Right 01/19/2021   [place] Bx, ribbon, path pending   BREAST BIOPSY Right 01/19/2021   [place] Bx, Venus, path pending   BREAST BIOPSY Right 01/19/2021   [place] Bx, heart, path pending   BREAST BIOPSY Right 01/19/2021   [place] BX Axilla, Coil, path pending   CESAREAN SECTION     CHOLECYSTECTOMY OPEN  11/17/2013   with repair of bile duct-Dr. Marina Gravel   MASTECTOMY MODIFIED RADICAL Right 03/02/2021   Procedure: MASTECTOMY MODIFIED RADICAL;  Surgeon: Benjamine Sprague, DO;  Location: [facility] ORS;  Service:  General;  Laterality: Right;   OPEN REDUCTION INTERNAL FIXATION (ORIF) DISTAL RADIAL FRACTURE Right 12/20/2014   Procedure: OPEN REDUCTION INTERNAL FIXATION (ORIF) RIGHT DISTAL RADIAL FRACTURE;  Surgeon: Renette Butters, MD;  Location: [facility];  Service: Orthopedics;  Laterality: Right;   STOMACH SURGERY      There were no vitals filed for this visit.   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     PLAN:       1. 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  She underwent full mastectomy on December 30, 2021.  Despite recommendation of adjuvant Adriamycin  and Cytoxan followed by Taxol, patient continues to be adamant that she will not undergo chemotherapy.  She has highly hesitant to pursue adjuvant XRT as well.  Given her ER/PR status of her tumor, it was recommended patient also take letrozole which she is considering.  No intervention is needed at this time.  Patient will follow-up in 3 weeks for further evaluation and additional treatment planning.   2. Postoperative pain: Patient was given a prescription for hydrocodone today.  3. Lymphedema: Patient was given a referral to lymphedema clinic as well has an appointment with occupational therapy.    Patient Stated Goals To Get My Right Arm Smaller and Prevent Infection    Currently in Pain? No/denies                 LYMPHEDEMA/ONCOLOGY QUESTIONNAIRE - 12/18/21 0001       Right Upper Extremity  Lymphedema   15 cm Proximal to Olecranon Process 28 cm    10 cm Proximal to Olecranon Process 28 cm    Olecranon Process 28 cm    15 cm Proximal to Ulnar Styloid Process 26 cm    10 cm Proximal to Ulnar Styloid Process 23.4 cm    Just Proximal to Ulnar Styloid Process 17 cm    Across Hand at [employer] 18.5 cm                  Pt arrive with her daytime compression sleeve on - caretaker good about assisting pt the days she cames with pump hour and making sure when leaving to take off daytime compression and donn tubigrip compression  and isotoner glove  Rep to contact pt /son about fit and to increase time  Pt wrist and forearm increased greatly 2 visits ago -but the last 2 times including today decrease some in the forearm Caretaker report she put pt in pump a hour before coming in Pt has sometimes no compression on when she gets there   REINFORCE with pt again - tan compression only for daytime use - NOT night time- can cause tourniquet  MADE HER NOTE TO PUT UP AT BED         Ask caretaker to encourage patient to use pump  Pt ed on circaid compression garments benefit -for her maybe because she can wear same compression day and night - just loosen at night time And OT can set tabs for her to adjust velcro Medicare will cover compression sleeves started Jan 2024- pt to return then for follow up                        OT Education - 12/18/21 1831     Education Details use of pump- donning and wearing of compression- and bandaging with pt and  caregiver    Person(s) Educated Patient;Caregiver(s)    Methods Explanation;Demonstration;Verbal cues;Handout;Tactile cues    Comprehension Verbal cues required;Tactile cues required;Returned demonstration;Verbalized understanding                 OT Long Term Goals - 11/27/21 1842       OT LONG TERM GOAL #1   Title Patient to be independent in home program to tolerate and wearing of compression  bandaging to decrease circumference in the right upper extremity to get measured for compression garments    Baseline Patient had never done bandaging before and showing right upper extremity and thoracic lymphedema.  Upper arm increased by 3 cm  and elbow and forearm 3 to 4 cm and wrist 2.5 cm.  10/15/2021:  Pt has not been wearing daytime sleeve for a while, increase in measurements. NOW pt was decrease in circumference 2 wsk ago but pt unable to maintain circumference with homeprogram - not compliant ?    Time 3    Period Weeks    Status On-going    Target Date 12/18/21      OT LONG TERM GOAL #2   Title Patient to be independent and wearing of compression garment to decrease and maintain right upper extremity circumference.    Baseline Patient increased by 3 to 4 cm in right upper extremity compared to left.  Has no pump or compression done in the past.  No knowledge.  10/15/21:  reassessment of measurement increase since 2 wks ago - but decrease the lsat week - caretakers to help pt use her pump and wear sleeve correctly daytime - tubigrip at night time    Time 6    Period Weeks    Status On-going    Target Date 01/08/22      OT LONG TERM GOAL #3   Title Patient to be independent in use of pneumatic compression pump to supplement daytime compression to maintain circumference and decrease future infection in right upper extremity    Baseline Assess if patient can be fitted with a pneumatic pump has no knowledge would not be able to tolerate a nighttime garment because of mental health would recommend the pump to use morning and evening. NOW pt to using pump consistant - time is decrease to 35 min and ? correct compression -ask Rep to reassess fit    Time 6    Status On-going    Target Date 01/08/22                   Plan - 12/18/21 1836     Clinical Impression Statement Patient  referred by Dr. Grayland Ormond for right upper extremity lymphedema.  Patient is  s/p right mastectomy with  11-13 lymph nodes removed.  Pt was seen by this OT in May 23 for bandaging of R UE to decrease lymphedema and circumference in R UE - and fitted with daytime compression sleeve and glove as well as pump. Pt return in Sept with increase of circumference in R UE lymphedema.  Attempted patient with pump and compression at home but was unable to decongest.  Done bandages for a couple of week with good success but patient could not tolerate it as well and a lot of itching at the elbow.  Patient was set up with a home program again to use her pump twice a day with a light nighttime Tubigrip compression and a sleeve.  Patient has a hard time being compliant with program.  Patient's forearm increased greatly couple of times but the last 2 times able to decrease forearm with the help of caretaker using the pump and wearing the daytime compression caretaker during day. Did contact Rep  to assess pump fit and increase time to 45 min to hour - and to use 2 x day for 45 min to hour - Pt still having issues being compliant with wearing correct sleeve - MADE her note  for Daysleeve NOT to be wear at night time and tubigrip at night time. Pt will follow up in 5 wks and will reassess- Plan to fit pt at that time with Circaid compression sleeve that she can wear dayttime and adjust at  night time- to increase follow thru with homeprogram. Her existing sleeve is about 7-8 months old. Pt  to decrease or maintain her R UE lymphedema circumference with homeprogram- with pt having caregiver 3 x wk.    OT Occupational Profile and History Problem Focused Assessment - Including review of records relating to presenting problem    Occupational performance deficits (Please refer to evaluation for details): ADL's;IADL's;Leisure;Play    Body Structure / Function / Physical Skills ADL;Decreased knowledge of precautions;Flexibility;Scar mobility;IADL;Edema    Rehab Potential Good    Clinical Decision Making Limited treatment options, no task  modification necessary    Comorbidities Affecting Occupational Performance: None    Modification or Assistance to Complete Evaluation  No modification of tasks or assist necessary to complete eval    OT Frequency Monthly    OT Duration 4 weeks    OT Treatment/Interventions Self-care/ADL training;Manual lymph drainage;DME and/or AE instruction;Compression bandaging;Scar mobilization;Manual Therapy;Patient/family education    Consulted and Agree with Plan of Care Patient             Patient will benefit from skilled therapeutic intervention in order to improve the following deficits and impairments:   Body Structure / Function / Physical Skills: ADL, Decreased knowledge of precautions, Flexibility, Scar mobility, IADL, Edema       Visit Diagnosis: Postmastectomy lymphedema syndrome  Scar condition and fibrosis of skin    Problem List Patient Active Problem List   Diagnosis Date Noted   Major depressive disorder, recurrent severe without psychotic features ([place]) 04/21/2021   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[place]) 01/28/2021   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, [place], [address] Phone: [phone]   Fax:  [phone]  Name: Elizabeth [name] MRN: [id number] Date of Birth: 12-Mar-1950

## 2021-12-27 ENCOUNTER — Encounter: Payer: Self-pay | Admitting: Nurse Practitioner

## 2021-12-27 ENCOUNTER — Inpatient Hospital Stay: Payer: Medicare HMO | Attending: Oncology | Admitting: Nurse Practitioner

## 2021-12-27 ENCOUNTER — Ambulatory Visit: Payer: Medicare HMO | Admitting: Oncology

## 2021-12-27 VITALS — BP 121/70 | HR 82 | Temp 97.0°F | Wt 110.4 lb

## 2021-12-27 DIAGNOSIS — Z170 Estrogen receptor positive status [ER+]: Secondary | ICD-10-CM | POA: Diagnosis not present

## 2021-12-27 DIAGNOSIS — I972 Postmastectomy lymphedema syndrome: Secondary | ICD-10-CM | POA: Insufficient documentation

## 2021-12-27 DIAGNOSIS — Z9013 Acquired absence of bilateral breasts and nipples: Secondary | ICD-10-CM | POA: Diagnosis not present

## 2021-12-27 DIAGNOSIS — Z79811 Long term (current) use of aromatase inhibitors: Secondary | ICD-10-CM | POA: Insufficient documentation

## 2021-12-27 DIAGNOSIS — F319 Bipolar disorder, unspecified: Secondary | ICD-10-CM | POA: Diagnosis not present

## 2021-12-27 DIAGNOSIS — Z08 Encounter for follow-up examination after completed treatment for malignant neoplasm: Secondary | ICD-10-CM | POA: Diagnosis not present

## 2021-12-27 DIAGNOSIS — Z803 Family history of malignant neoplasm of breast: Secondary | ICD-10-CM | POA: Diagnosis not present

## 2021-12-27 DIAGNOSIS — L989 Disorder of the skin and subcutaneous tissue, unspecified: Secondary | ICD-10-CM | POA: Insufficient documentation

## 2021-12-27 DIAGNOSIS — Z853 Personal history of malignant neoplasm of breast: Secondary | ICD-10-CM

## 2021-12-27 DIAGNOSIS — F209 Schizophrenia, unspecified: Secondary | ICD-10-CM | POA: Insufficient documentation

## 2021-12-27 DIAGNOSIS — Z90710 Acquired absence of both cervix and uterus: Secondary | ICD-10-CM | POA: Diagnosis not present

## 2021-12-27 DIAGNOSIS — Z87891 Personal history of nicotine dependence: Secondary | ICD-10-CM | POA: Diagnosis not present

## 2021-12-27 DIAGNOSIS — C50911 Malignant neoplasm of unspecified site of right female breast: Secondary | ICD-10-CM | POA: Insufficient documentation

## 2021-12-27 NOTE — Progress Notes (Signed)
RN assisted NP with punch biopsy procedure.

## 2021-12-27 NOTE — Progress Notes (Signed)
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 She reports compliance with her letrozole. Reports new lumps in right skin/chest wall at site of her mastectomy scar. Reports this has appeared over past few weeks. She's had a 20+ lb weight loss unintentionally. She has chronic lymphedema of right arm and wears compression sleeve. She denies neurologic complaints. No recent fevers or illness. No pain, shortness of breath, cough, or hemoptysis. She denies nausea, vomiting, constipation, or diarrhea. She denies urinary complaints. She is accompanied by her son who lives in [place]. Per patient and son she has limited memory but is able to make medical decisions.   REVIEW OF SYSTEMS:   Review of Systems  Reason unable to perform ROS: limited.  Constitutional:  Positive for malaise/fatigue and weight loss. Negative for fever.  Respiratory:  Negative for cough, hemoptysis and shortness of breath.   Cardiovascular:  Negative for chest pain and leg swelling.  Gastrointestinal:  Negative for abdominal pain, blood in stool, constipation, diarrhea, heartburn, melena, nausea and vomiting.  Genitourinary:  Negative for dysuria and hematuria.  Musculoskeletal:  Negative for back pain.  Skin:  Positive for rash.  Neurological:  Negative for dizziness, sensory change, focal weakness, weakness and headaches.  Endo/Heme/Allergies:  Does not bruise/bleed easily.   Psychiatric/Behavioral:  Positive for memory loss. The patient is nervous/anxious.        Hx of bipolar and SI.   As per HPI. Otherwise, a complete review of systems is negative.  PAST MEDICAL HISTORY: Past Medical History:  Diagnosis Date   Acute cholecystitis 2016   Anemia    Anemia 2016   Anxiety    panic attacks   Anxiety and depression    Bipolar 1 disorder (HCC)    Bowel obstruction ([place]) 2017   Common bile duct dilatation 2015   Dyspnea    Edema    Fibromyalgia    GI bleed 2017   History of kidney stones 01/21/2009   HSV-1 infection    Hyperglycemia    Hyperlipidemia    Hypokalemia    Hypothyroidism    Liver cyst    Malignant neoplasm of right female breast, unspecified estrogen receptor status, unspecified site of breast ([place])    Memory loss    Osteopenia    Panic disorder    Schizophrenia ([place])    Ventral hernia     PAST SURGICAL HISTORY: Past Surgical History:  Procedure Laterality Date   ABDOMINAL HYSTERECTOMY     BREAST BIOPSY Right 01/19/2021   [place] Bx, ribbon, path pending   BREAST BIOPSY Right 01/19/2021   [place] Bx, Venus, path pending   BREAST BIOPSY Right 01/19/2021   [place] Bx, heart, path pending   BREAST BIOPSY Right 01/19/2021   [place] BX Axilla, Coil, path pending   CESAREAN SECTION     CHOLECYSTECTOMY OPEN  11/17/2013   with repair of bile duct-Dr. Marina Gravel   MASTECTOMY MODIFIED RADICAL Right 03/02/2021   Procedure: MASTECTOMY MODIFIED RADICAL;  Surgeon: Benjamine Sprague, DO;  Location: [facility] ORS;  Service: General;  Laterality: Right;   OPEN REDUCTION INTERNAL FIXATION (ORIF) DISTAL RADIAL FRACTURE Right 12/20/2014   Procedure: OPEN REDUCTION INTERNAL FIXATION (ORIF) RIGHT DISTAL RADIAL FRACTURE;  Surgeon: Renette Butters, MD;  Location: [facility];  Service: Orthopedics;  Laterality: Right;   STOMACH SURGERY      FAMILY HISTORY: Family History  Problem Relation Age of Onset   Heart disease Father    Breast cancer Paternal Aunt     Kidney disease Paternal 26    Breast cancer Paternal Grandmother    Asthma Daughter    Bladder Cancer Neg Hx     ADVANCED DIRECTIVES (Y/N):  N   HEALTH MAINTENANCE: Social History   Tobacco Use   Smoking status: Former    Packs/day: 0.00    Years: 0.00    Total pack years: 0.00    Types: Cigarettes    Quit date: 11/03/1973    Years since quitting: 48.1    Passive exposure: Past   Smokeless tobacco: Never   Tobacco comments:    Smoked briefly as a teen- lives in secondhand smoke.   Vaping Use   Vaping Use: Never used  Substance Use Topics   Alcohol use: No    Alcohol/week: 0.0 standard drinks of alcohol   Drug use: No     Colonoscopy:  PAP:  Bone density:  Lipid panel:   Allergies  Allergen Reactions   Penicillins Hives    TOLERATED CEFAZOLIN    Current Outpatient Medications  Medication Sig Dispense Refill   alendronate (FOSAMAX) 70 MG tablet Take 1 tablet by mouth every Monday.     ferrous sulfate 325 (65 FE) MG tablet Take 1 tablet by mouth daily with breakfast.     letrozole (FEMARA) 2.5 MG tablet Take 1 tablet (2.5 mg total) by mouth daily. 90 tablet 3   levothyroxine (SYNTHROID, LEVOTHROID) 88 MCG tablet Take 88 mcg by mouth daily before breakfast.     Vitamin D, Ergocalciferol, (DRISDOL) 1.25 MG (50000 UNIT) CAPS capsule Take 50,000 Units by mouth once a week.     QUEtiapine (SEROQUEL) 25 MG tablet Take 1 tablet (25 mg total) by mouth at bedtime. 30 tablet 0   sertraline (ZOLOFT) 25 MG tablet Take 1 tablet (25 mg total) by mouth daily. 30 tablet 0   No current facility-administered medications for this visit.    OBJECTIVE: Vitals:   12/27/21 1354  BP: 121/70  Pulse: 82  Temp: (!) 97 F (36.1 C)     Body mass index is 20.36 kg/m.    ECOG FS:0 - Asymptomatic  General: Thin build.  Eyes: Pink conjunctiva, anicteric sclera. HEENT: Normocephalic, moist mucous membranes. Breast: Right mastectomy. See image.  Lungs: No audible wheezing or  coughing. Heart: Regular rate and rhythm. Abdomen: Soft, nontender, no obvious distention. Musculoskeletal: Right arm in lymphedema sleeve. Neuro: Alert, answering all questions appropriately. Cranial nerves grossly intact. Skin: No rashes or petechiae noted. Psych: anxious. Fluctuating thought process. Repetitive. Paranoid (requesting to take gown with her)    Skin lesion punch biopsy The risks and benefits of the procedure were reviewed and informed consent obtained including risks including but not limited to: bleeding, pain, infection, need for further surgery/procedures. Time out was performed. The patient received pre-procedure teaching and expressed understanding. The post-procedure instructions were reviewed with the patient and she expressed understanding. The patient and her son verbalized consent.  After obtaining informed consent, the area of the right chest was prepped in usual fashion.  Anesthesia was obtained with 2% lidocaine.  A full-thickness punch biopsy was obtained with a 4 mm punch. Silver nitrate was applied and pressure dressing applied until hemostasis was achieved. Clean, dry dressing applied to area. Specimen sent to pathology.  Wound dressed with gauze covered by a transparent dressing.    LAB RESULTS:  Lab Results  Component Value Date   NA 139 07/18/2021   K 3.8 07/18/2021   CL 107 07/18/2021   CO2 25 07/18/2021   GLUCOSE 131 (H) 07/18/2021   BUN 16 07/18/2021   CREATININE 0.79 07/18/2021   CALCIUM 8.7 (L) 07/18/2021   PROT 7.3 07/18/2021   ALBUMIN 3.8 07/18/2021   AST 19 07/18/2021   ALT 13 07/18/2021   ALKPHOS 115 07/18/2021   BILITOT 0.4 07/18/2021   GFRNONAA >60 07/18/2021   GFRAA 28 (L) 12/18/2015    Lab Results  Component Value Date   WBC 7.0 07/18/2021   NEUTROABS 8.1 (H) 11/20/2013   HGB 11.3 (L) 07/18/2021   HCT 35.0 (L) 07/18/2021   MCV 90.9 07/18/2021   PLT 437 (H) 07/18/2021    STUDIES: No results found.  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  She underwent full mastectomy on December 30, 2021. Adjuvant chemo and/or radiation was recommended however, patient declined. She continues to tolerate letrozole well without significant side effects. Reports compliance however, questionable. New skin lesion (see below) and subjective weight loss (weight stable since August 2023).  Chest/skin lesion- clinically highly suspicious for recurrent breast cancer. Biopsy obtained today. Recommend CT C/A/P to evaluate for recurrent disease. Patient agrees  Lymphedema- secondary to mastectomy. Continue follow up with lymphedema clinic. Can refill lymphedema sleeves when needed.  History of bipolar/schizophrenia/suicidal ideation- followed by Dr. Modesta Messing. Denies SI today. Per patient and her son, she is able to make medical decisions. Currently lives alone.  Bone health- hold on bone density imaging for now.  Goals of care- patient unsure if she would want to take any treatments if found to have recurrent disease. Recommended she meet with palliative care at next visit to discuss goals and provide symptom management. Patient agrees.   Updated Dr Grayland Ormond who agrees with plan of care and follow up.   Disposition: Ct c/a/p Follow up with Dr. Grayland Ormond for results. Will have patient see palliative care on same day.   Patient expressed understanding and was in agreement with this plan. She also understands that She can call clinic at any time with any questions, concerns, or complaints.    Cancer Staging  Invasive ductal carcinoma of right breast [facility]) Staging form: Breast, AJCC 8th Edition - Pathologic stage from 03/14/2021: Stage IIIB (pT2, pN3a, cM0, G3,  ER+, PR+, HER2-) - Signed by [name], MD on 03/14/2021 Stage prefix: Initial diagnosis Histologic grading system: 3 grade system   Verlon Au, NP   12/27/2021

## 2021-12-27 NOTE — Progress Notes (Addendum)
[place] MD/PA/NP OP Progress Note  12/31/2021 10:37 AM Elizabeth [name]  MRN:  [id number]  Chief Complaint:  Chief Complaint  Patient presents with   Follow-up   HPI:  This is a follow-up appointment for bipolar disorder and insomnia.  She states that she has a caregiver, who comes twice a week (per Elizabeth [name], she comes three times a week).  She likes this caregiver, stating that she cheers her up.  Although she goes to the day program twice a week, she does not like it.  She does not to be around with people.  When she was asked about her appetite, she states that she is not "good at all." She lost two pounds (she was reassured that her weight has not changed for the past 2 months).  She states that she has dysphagia (per Elizabeth [name], she tends to eat large meals/medication all at once).  When she was offered to consider switching to Abilify to convert it to injection in the future, she declines this.  She states that her sister was on Abilify.  Her sister died by shooting herself in the head.  She also declines to increase of the dose of the medication, although she is unable to elaborate the reason.  The patient has mood symptoms as in PHQ-9/GAD-7.  Although she reports SI with plan, she does not elaborate the plan.  She denies any suicide attempts since the last time, and denies any intention to act on it.  When she was asked if she has guns, she paused without saying anything. She then states that she does not have access to them.  When she was asked about the reason of pause, she "dozed off" that moment (per Elizabeth [name], she was resisting to answer the questions, stating that it is her "behavioral" issues.)   Elizabeth [name], her son presents to the visit.  He has been at her place for the past week.  She appears to be sleeping good.  She has been eating 2-3 times a day, and he denies concern about appetite.  He thinks she has been taking medication 5 days a week, which has been unchanged. She occasionally has "illusion"  of being somebody else.  Her mood tends to worsen in the morning. He denies any gun access at home. He denies any other concern at this time, and has agreed with the plan as below.   Months backward test- Dec-Aug. (Unable to continue)  Wt Readings from Last 3 Encounters:  12/31/21 108 lb 6.4 oz (49.2 kg)  12/27/21 110 lb 6.4 oz (50.1 kg)  11/05/21 108 lb 9.6 oz (49.3 kg)    Visit Diagnosis:    ICD-10-CM   1. Bipolar affective disorder, currently depressed, moderate (HCC)  F31.32 EKG 12-Lead    2. Insomnia, unspecified type  G47.00     3. Anxiety state  F41.1       Past Psychiatric History: Please see initial evaluation for full details. I have reviewed the history. No updates at this time.     Past Medical History:  Past Medical History:  Diagnosis Date   Acute cholecystitis 2016   Anemia    Anemia 2016   Anxiety    panic attacks   Anxiety and depression    Bipolar 1 disorder (HCC)    Bowel obstruction ([place]) 2017   Common bile duct dilatation 2015   Dyspnea    Edema    Fibromyalgia    GI bleed 2017   History of kidney stones 01/21/2009  HSV-1 infection    Hyperglycemia    Hyperlipidemia    Hypokalemia    Hypothyroidism    Liver cyst    Malignant neoplasm of right female breast, unspecified estrogen receptor status, unspecified site of breast ([place])    Memory loss    Osteopenia    Panic disorder    Schizophrenia ([place])    Ventral hernia     Past Surgical History:  Procedure Laterality Date   ABDOMINAL HYSTERECTOMY     BREAST BIOPSY Right 01/19/2021   [place] Bx, ribbon, path pending   BREAST BIOPSY Right 01/19/2021   [place] Bx, Venus, path pending   BREAST BIOPSY Right 01/19/2021   [place] Bx, heart, path pending   BREAST BIOPSY Right 01/19/2021   [place] BX Axilla, Coil, path pending   CESAREAN SECTION     CHOLECYSTECTOMY OPEN  11/17/2013   with repair of bile duct-Dr. Marina Gravel   MASTECTOMY MODIFIED RADICAL Right 03/02/2021   Procedure: MASTECTOMY MODIFIED RADICAL;   Surgeon: Benjamine Sprague, DO;  Location: [facility] ORS;  Service: General;  Laterality: Right;   OPEN REDUCTION INTERNAL FIXATION (ORIF) DISTAL RADIAL FRACTURE Right 12/20/2014   Procedure: OPEN REDUCTION INTERNAL FIXATION (ORIF) RIGHT DISTAL RADIAL FRACTURE;  Surgeon: Renette Butters, MD;  Location: [facility] [facility];  Service: Orthopedics;  Laterality: Right;   STOMACH SURGERY      Family Psychiatric History: Please see initial evaluation for full details. I have reviewed the history. No updates at this time.     Family History:  Family History  Problem Relation Age of Onset   Heart disease Father    Breast cancer Paternal Aunt    Kidney disease Paternal 67    Breast cancer Paternal Grandmother    Asthma Daughter    Bladder Cancer Neg Hx     Social History:  Social History   Socioeconomic History   Marital status: Married    Spouse name: Not on file   Number of children: 1   Years of education: Not on file   Highest education level: Not on file  Occupational History   Not on file  Tobacco Use   Smoking status: Former    Packs/day: 0.00    Years: 0.00    Total pack years: 0.00    Types: Cigarettes    Quit date: 11/03/1973    Years since quitting: 48.1    Passive exposure: Past   Smokeless tobacco: Never   Tobacco comments:    Smoked briefly as a teen- lives in secondhand smoke.   Vaping Use   Vaping Use: Never used  Substance and Sexual Activity   Alcohol use: No    Alcohol/week: 0.0 standard drinks of alcohol   Drug use: No   Sexual activity: Not Currently  Other Topics Concern   Not on file  Social History Narrative   Not on file   Social Determinants of Health   Financial Resource Strain: Not on file  Food Insecurity: Not on file  Transportation Needs: Not on file  Physical Activity: Not on file  Stress: Not on file  Social Connections: Not on file    Allergies:  Allergies  Allergen Reactions   Penicillins Hives    TOLERATED CEFAZOLIN     Metabolic Disorder Labs: No results found for: "HGBA1C", "MPG" No results found for: "PROLACTIN" No results found for: "CHOL", "TRIG", "HDL", "CHOLHDL", "VLDL", "LDLCALC" No results found for: "TSH"  Therapeutic Level Labs: No results found for: "LITHIUM" Lab Results  Component Value  Date   VALPROATE <10 (L) 07/18/2021   No results found for: "CBMZ"  Current Medications: Current Outpatient Medications  Medication Sig Dispense Refill   alendronate (FOSAMAX) 70 MG tablet Take 1 tablet by mouth every Monday.     ferrous sulfate 325 (65 FE) MG tablet Take 1 tablet by mouth daily with breakfast.     letrozole (FEMARA) 2.5 MG tablet Take 1 tablet (2.5 mg total) by mouth daily. 90 tablet 3   levothyroxine (SYNTHROID, LEVOTHROID) 88 MCG tablet Take 88 mcg by mouth daily before breakfast.     Vitamin D, Ergocalciferol, (DRISDOL) 1.25 MG (50000 UNIT) CAPS capsule Take 50,000 Units by mouth once a week.     QUEtiapine (SEROQUEL) 25 MG tablet Take 1 tablet (25 mg total) by mouth at bedtime. 30 tablet 1   sertraline (ZOLOFT) 25 MG tablet Take 1 tablet (25 mg total) by mouth daily. 30 tablet 1   No current facility-administered medications for this visit.     Musculoskeletal: Strength & Muscle Tone: within normal limits Gait & Station: normal Patient leans: N/A  Psychiatric Specialty Exam: Review of Systems  Psychiatric/Behavioral:  Positive for decreased concentration, dysphoric mood and suicidal ideas. Negative for agitation, behavioral problems, confusion, hallucinations, self-injury and sleep disturbance. The patient is nervous/anxious. The patient is not hyperactive.   All other systems reviewed and are negative.   Blood pressure 111/68, pulse 91, height 5' 1.75" (1.568 m), weight 108 lb 6.4 oz (49.2 kg).Body mass index is 19.99 kg/m.  General Appearance: Fairly Groomed  Eye Contact:  Minimal  Speech:  Clear and Coherent  Volume:  Normal  Mood:  Depressed  Affect:   Appropriate, Congruent, and Restricted  Thought Process:  Coherent  Orientation:  Full (Time, Place, and Person) (except the date "12 th")  Thought Content: Illogical and Delusions   Suicidal Thoughts:  Yes.  without intent/plan  Homicidal Thoughts:  No  Memory:  Immediate;   Good  Judgement:  Impaired  Insight:  Lacking  Psychomotor Activity:  Decreased. Normal tone, no rigidity, no resting/postural tremors, no tardive dyskinesia    Concentration:  Concentration: Poor and Attention Span: Poor  Recall:  [place] of Knowledge: Good  Language: Good  Akathisia:  No  Handed:  Right  AIMS (if indicated): not done  Assets:  Social Support  ADL's:  Intact  Cognition: WNL  Sleep:  Good   Screenings: GAD-7    Flowsheet Row Office Visit from 12/31/2021 in [place] Office Visit from 11/05/2021 in [facility]  Total GAD-7 Score 18 21      PHQ2-9    [place] Visit from 12/31/2021 in [place] Office Visit from 11/05/2021 in [place] Office Visit from 08/23/2021 in [place] Visit from 07/03/2021 in [place]  PHQ-2 Total Score _0 PHQ-9 Total Score _1 [place] Office Visit from 12/31/2021 in [facility] [place] Office Visit from 11/05/2021 in [place] Office Visit from 08/23/2021 in [place] Error: Q7 should not be populated when Q6 is No Error: Q3, 4, or 5 should not be populated when Q2 is No Error: Q3, 4, or 5 should not be populated when Q2 is No        Assessment and Plan:  SIARAH DELEO is a 71 y.o. year  old female with a history of depression, bipolar I disorder, schizophrenia, stage IIIb ER/PR positive, HER2 negative multifocal invasive  carcinoma of right breast s/p mastectomy,, who presents for follow up appointment for below.   1. Bipolar affective disorder, currently depressed, moderate ([place]) 2. Insomnia, unspecified type R/o MDD with psychotic features Unstable.  She continues to demonstrate disorganized thought process with rumination on being "antichrist", preoccupation with somatic symptoms, although she is calmer through the interview. Differentials includes depression, bipolar disorder (has this diagnosis according to her son, although she has no known manic symptoms), and/or early manifestation of neuropsychiatric symptoms secondary to underlying neurocognitive disorder.  According to her son, she was doing better when she was in the hospital.  Although it was discussed again regarding the referral to ECT due to medication non adherence and weight loss with decreased appetite, both the patient and her son is adamant not to pursue this treatment.  She is not interested in uptitration of the medication nor injection (Abilify).  Will continue quetiapine to target depression, insomnia.  Will continue sertraline to target depression and anxiety.   R/o cognitive impairment Unstable. She does have cognitive deficits as evidenced by [place]. Differential diagnosis includes depression. There are no obvious physical signs consistent with parkinson symptoms.  She has an upcoming appointment with the neurologist in Feb.  Will continue to monitor this.    Plan (her son was advised to contact the clinic if she needs any refills) Continue sertraline 25 mg daily Continue quetiapine 25 mg at night (436 msec 02/2021) Obtain EKG to monitor Qtc prolongation Next appointment: 2/5 at 11 AM.  Her son agrees to come together - she sees a therapist, Tiffany summers LCSW, weekly at [facility],  - consent form to have 2-way conversation with her son, Elizabeth [name] is on file. -Discussed with her son to bring her to the hospital if any concern  of continued decrease in appetite, any danger to self/others.     I have reviewed suicide assessment in detail. No change in the following assessment.    The patient demonstrates the following risk factors for suicide: Chronic risk factors for suicide include: psychiatric disorder of depression . Acute risk factors for suicide include: unemployment, social withdrawal/isolation, and recent suicide attempt/discharges from inpatient psychiatry. Protective factors for this patient include: positive social support. Considering these factors, the overall suicide risk at this point appears to be moderate, but not at imminent risk. There are no guns in the house. Patient is appropriate for outpatient follow up.      Collaboration of Care: Collaboration of Care: Other reviewed notes in Epic  Patient/Guardian was advised Release of Information must be obtained prior to any record release in order to collaborate their care with an outside provider. Patient/Guardian was advised if they have not already done so to contact the registration department to sign all necessary forms in order for [place] to release information regarding their care.   The duration of the time spent on the following activities on the date of the encounter was 45 minutes.   Preparing to see the patient (e.g., review of test, records)  Obtaining and/or reviewing separately obtained history  Performing a medically necessary exam and/or evaluation  Counseling and educating the patient/family/caregiver  Ordering medications, tests, or procedures  Documenting clinical information in the electronic or paper health record  Consent: Patient/Guardian gives verbal consent for treatment and assignment of benefits for services provided during this visit. Patient/Guardian expressed understanding and agreed to proceed.  Norman Clay, MD 12/31/2021, 10:37 AM

## 2021-12-28 ENCOUNTER — Telehealth: Payer: Self-pay | Admitting: Oncology

## 2021-12-28 ENCOUNTER — Other Ambulatory Visit: Payer: Self-pay | Admitting: Psychiatry

## 2021-12-28 NOTE — Patient Instructions (Signed)
Punch Biopsy of the Skin   What is punch biopsy? Punch biopsy is a commonly performed diagnostic procedure on abnormal skin growths or skin tumors. It is performed using a local anesthetic (numbing medicine). A pencil-like instrument is used to remove a small, thin cylinder of tissue. The small hole in the skin then may be sutured (stitched) closed.   What happens to the biopsy specimen once it is removed? After removal, the biopsy specimen is sent to the laboratory for further evaluation. The specimen is examined under a microscope by a subspecialist doctor known as a pathologist. The pathologist is trained to correctly identify the cells of various skin growths, which will assist your doctor in selecting the proper treatment.   Are there any complications after punch biopsy? Complications are uncommon following this simple procedure but can occur with any surgical procedure. Some of the complications associated with punch biopsy include local bleeding and bruising, pain, infection, allergic reaction to the numbing medicine used in the procedure, or damage to the structures beneath the skin site (such as an artery or a nerve). Your doctor will take care to reduce the likelihood of these rare problems.   What happens to the site where the piece of skin was removed? The biopsy site may be sutured (stitched) closed, depending on the size of the skin defect. The area often heals with a small scar. Your doctor may ask you to return in 5 to 14 days for removal of the stitches. You will be given instructions on how to help the biopsy site heal. The results of the biopsy evaluation will determine if further treatment of the skin site will be needed.   How long before I will receive the results of the biopsy evaluation? The biopsy results usually are available in one to two weeks. Your doctor?s office will notify you of the results. You do not need to call the office in the first two weeks after the  procedure. Sometimes the doctor will review the results with you at the follow-up (stitch removal) visit. If 1 month goes by and you have not heard from your doctor, call the office for the results of the biopsy.   Following Punch Biopsy of the Skin Immediately after removal of the skin biopsy specimen and closure of the biopsy site, your doctor will apply antibiotic ointment and a bandage to the site. Continue to apply antibiotic ointment to the wound until it is completely healed. The antibiotic ointment Mycitracin Plus is recommended because it contains numbing medicine in addition to the antibiotic.   You can remove the bandage at any time, but you may prefer to keep the wound covered. Keeping the site covered with a bandage may prevent rubbing at the site and will also keep the antibiotic ointment off your clothing.   If the biopsy site begins to bleed, apply direct pressure for 10 minutes. If it continues to bleed, call your doctor.   If you experience discomfort at the biopsy site, you can take ibuprofen (brand names: Advil, Motrin), three 200-mg tablets 3 times a day with food, or acetaminophen (brand name: Tylenol), two 325-mg tablets every 6 hours.   Skin infection can follow any surgical procedure. If you develop increased pain, redness, pus or swelling at the biopsy site, call your doctor.   Most doctors use the suture removal visit to discuss with you the pathology results of the biopsy, if they are available. It usually takes 1 to 2 weeks for your doctor to  receive the results of your biopsy. The doctor's office will contact you with the results. If 1 month goes by and you have not heard from your doctor's office, call to check on the biopsy results.   Am Fam Physician. 2002 Mar 15;65(6):1167-1168.

## 2021-12-28 NOTE — Telephone Encounter (Signed)
spoke with pt son, notified him that attempts have been made to have pt scheduled for CT, Son communicated that he would give office a call to schedule, [place] message sent to CT Scheduling notifying them of communication.

## 2021-12-31 ENCOUNTER — Encounter: Payer: Self-pay | Admitting: Psychiatry

## 2021-12-31 ENCOUNTER — Ambulatory Visit (INDEPENDENT_AMBULATORY_CARE_PROVIDER_SITE_OTHER): Payer: Medicare HMO | Admitting: Psychiatry

## 2021-12-31 VITALS — BP 111/68 | HR 91 | Ht 61.75 in | Wt 108.4 lb

## 2021-12-31 DIAGNOSIS — F3132 Bipolar disorder, current episode depressed, moderate: Secondary | ICD-10-CM | POA: Diagnosis not present

## 2021-12-31 DIAGNOSIS — F411 Generalized anxiety disorder: Secondary | ICD-10-CM | POA: Diagnosis not present

## 2021-12-31 DIAGNOSIS — G4700 Insomnia, unspecified: Secondary | ICD-10-CM | POA: Diagnosis not present

## 2021-12-31 MED ORDER — QUETIAPINE FUMARATE 25 MG PO TABS: 25.0000 mg | ORAL_TABLET | Freq: Every day | ORAL | 1 refills | Status: DC

## 2021-12-31 MED ORDER — SERTRALINE HCL 25 MG PO TABS: 25.0000 mg | ORAL_TABLET | Freq: Every day | ORAL | 1 refills | Status: DC

## 2021-12-31 NOTE — Patient Instructions (Signed)
Continue sertraline 25 mg daily Continue quetiapine 25 mg at night Obtain EKG Next appointment: 2/5 at 11 AM.

## 2022-01-01 LAB — SURGICAL PATHOLOGY

## 2022-01-05 IMAGING — CT CT CERVICAL SPINE W/O CM
5 series · 15 of 34 positions shown, 17 images · non-contrast
Comparison: None.

CLINICAL DATA: Found unresponsive on the floor.

EXAM:
CT CERVICAL SPINE WITHOUT CONTRAST
TECHNIQUE: Multidetector CT imaging of the cervical spine was performed without
intravenous contrast. Multiplanar CT image reconstructions were also
generated.

[Series 4: c_spine 2.0 orthogonals · oblique · 0.28mm/px · 2 of 76 slices shown]
[im 19/76  bone]
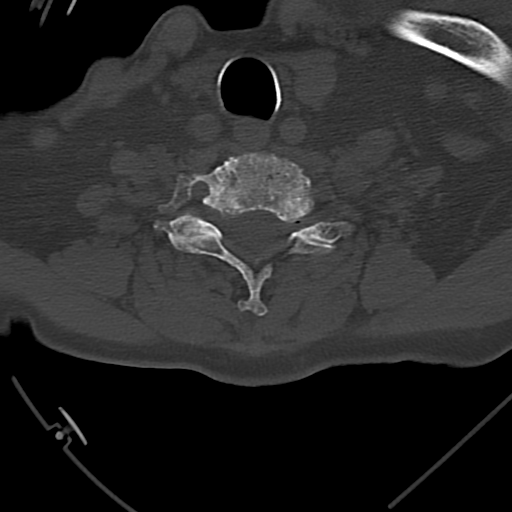
[im 38/76  bone]
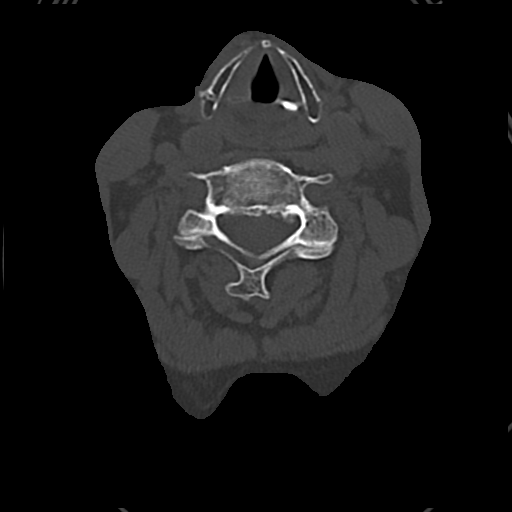

[Series 5: c_spine 2.0 st · axial · 0.26mm/px · z∈[-456,-408]mm · 2 of 72 slices shown]
[im 24/72  bone]
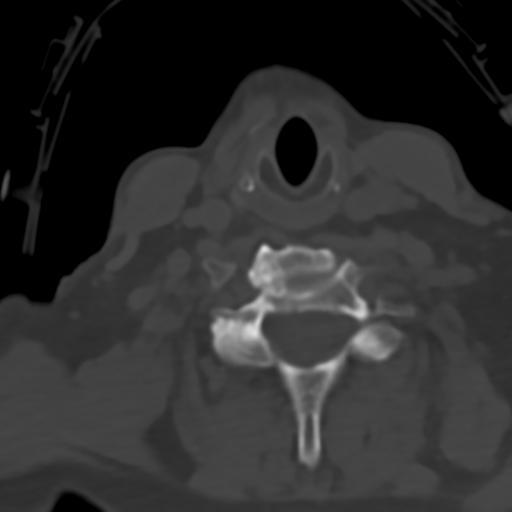
[im 48/72  bone]
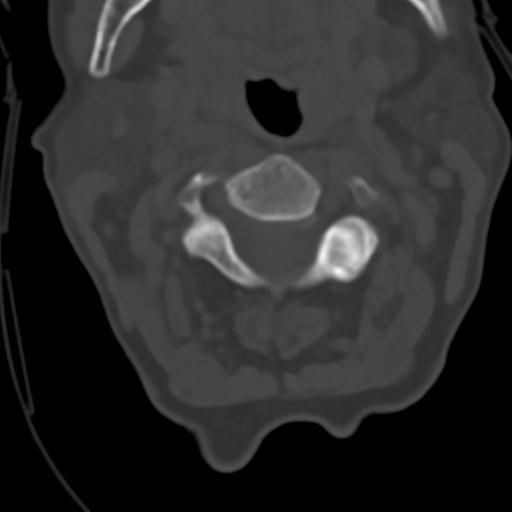

[Series 6: c_spine 2.0 ax bone · axial · 0.26mm/px · z∈[-497,-421]mm · 3 of 80 slices shown, 4 images]
[im 20/80  soft-tissue]
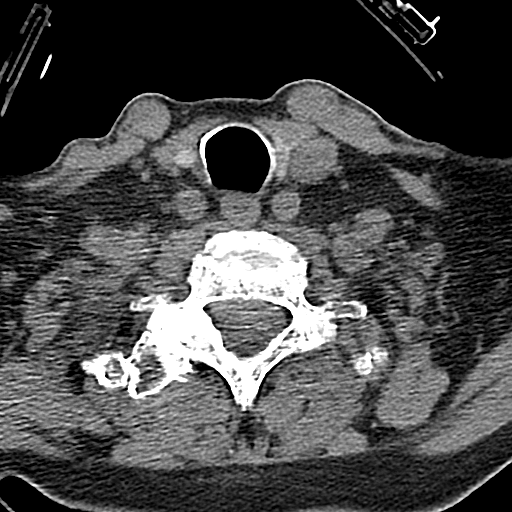
[im 20/80  bone]
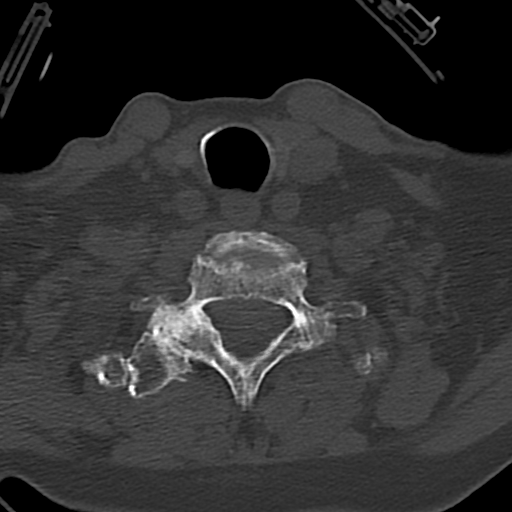
[im 40/80  bone]
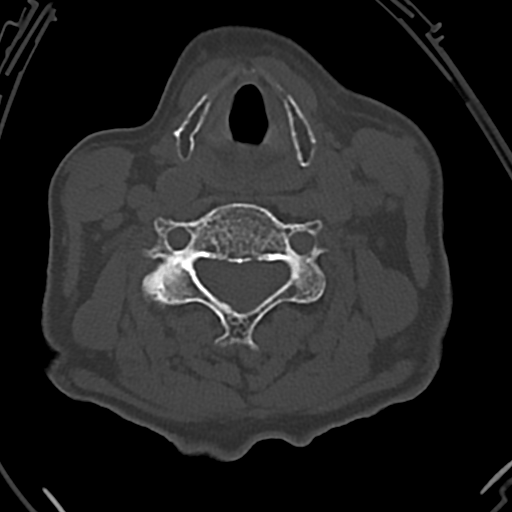
[im 60/80  bone]
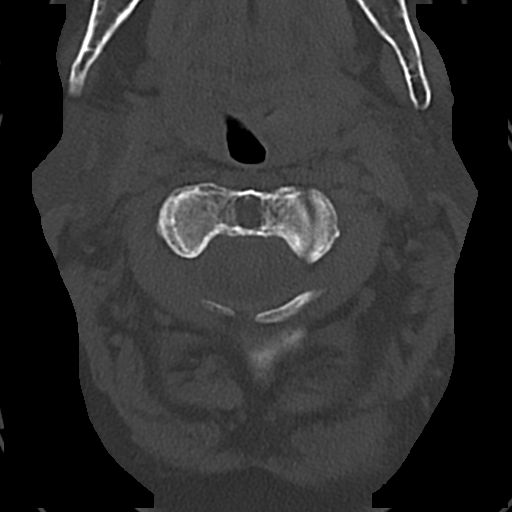

[Series 7: c_spine 2.0 sag bone · sagittal · 0.23mm/px · 5 of 54 slices shown, 6 images]
[im 18/54  bone]
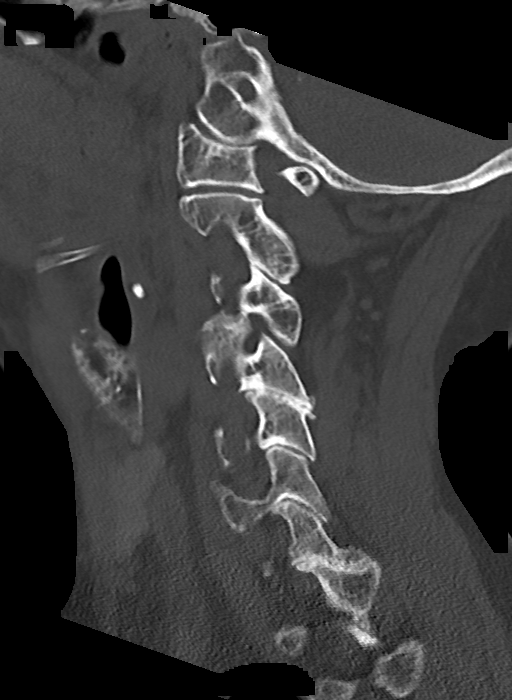
[im 23/54  bone]
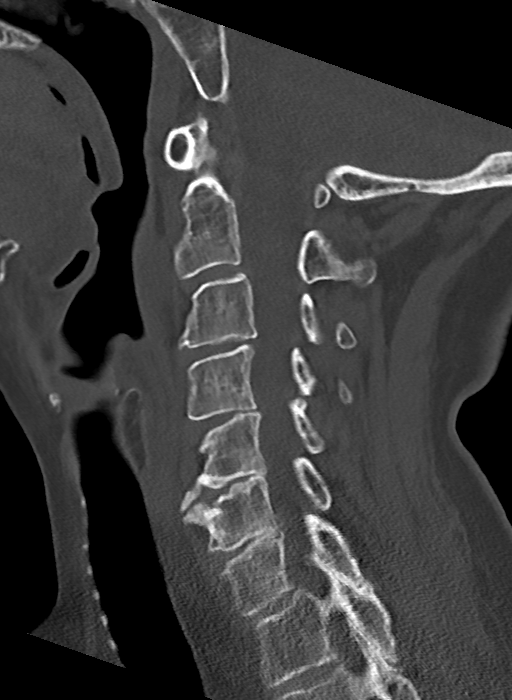
[im 27/54  soft-tissue]
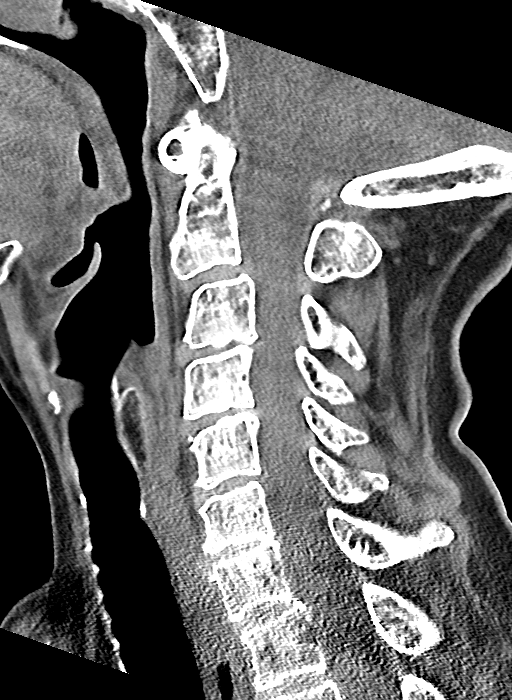
[im 27/54  bone]
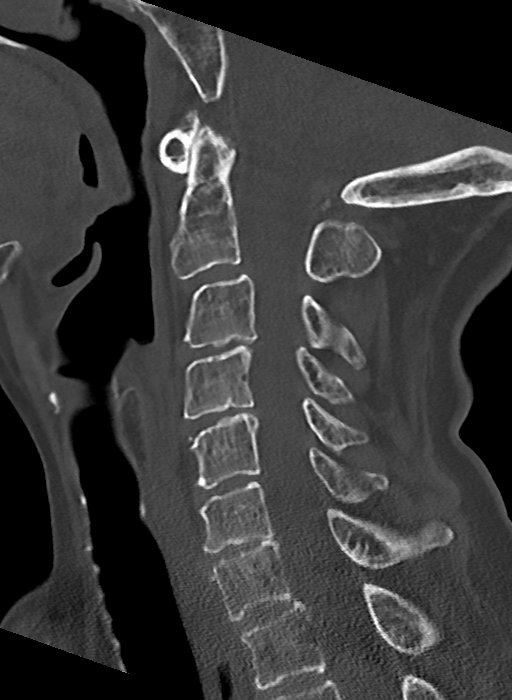
[im 31/54  bone]
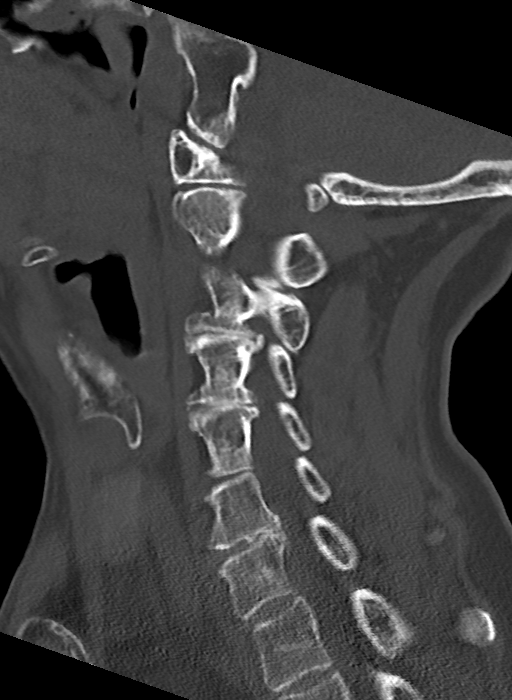
[im 36/54  bone]
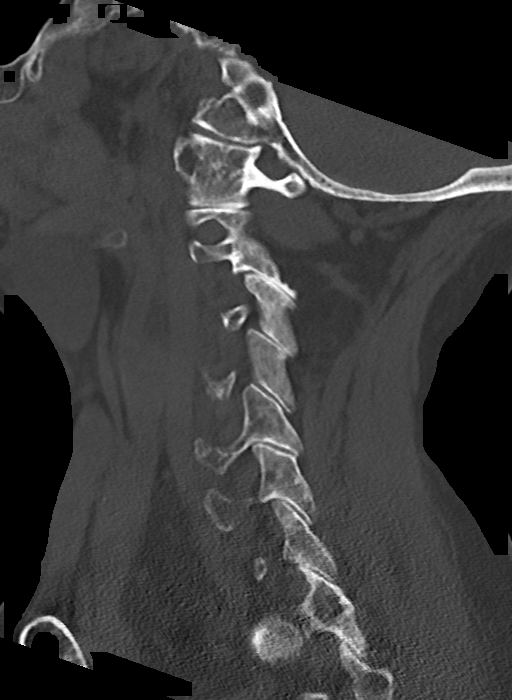

[Series 8: c_spine 2.0 cor bone · coronal · 0.23mm/px · 3 of 56 slices shown]
[im 12/56  bone]
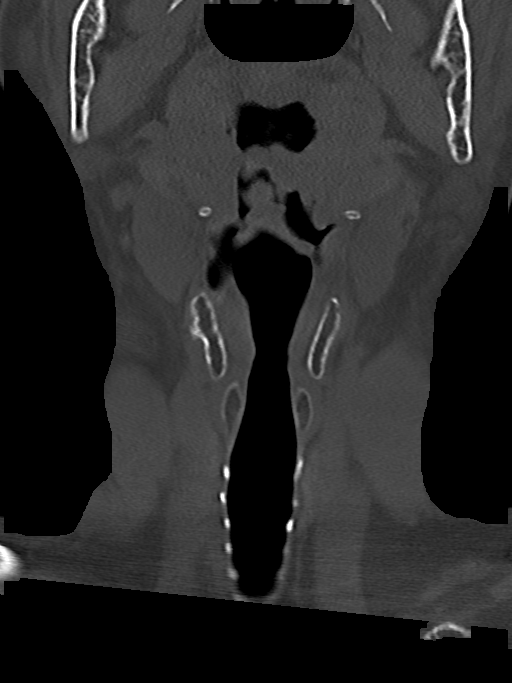
[im 23/56  bone]
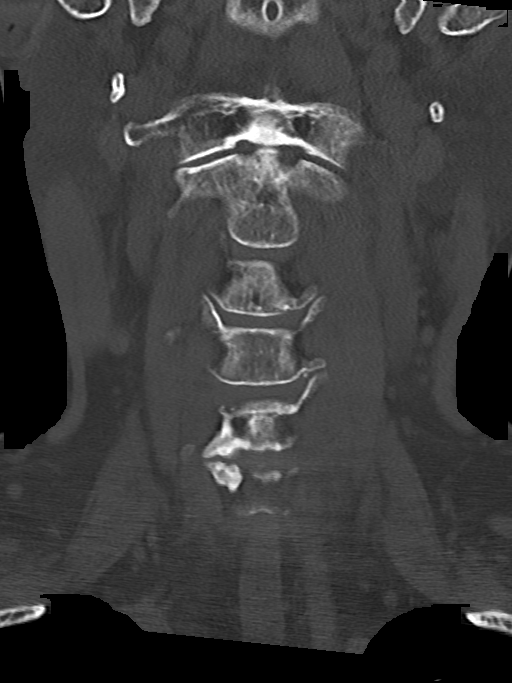
[im 34/56  bone]
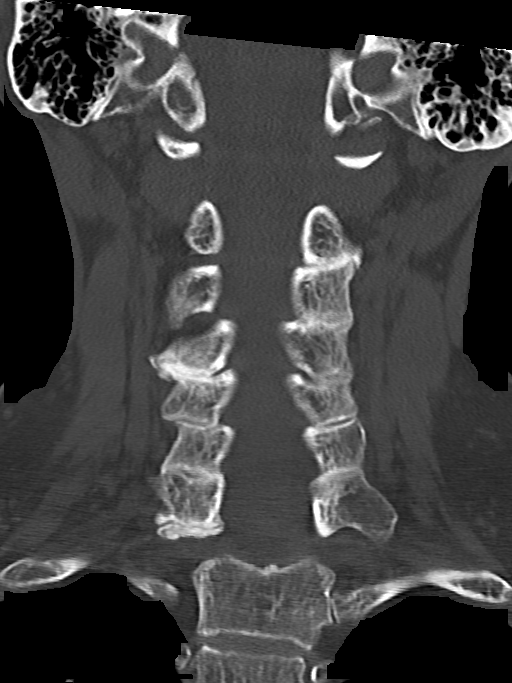

[15 of 34 positions shown; findings below may reference images not displayed]

FINDINGS: Alignment: Minimal scoliotic curvature convex to the right.

Skull base and vertebrae: No evidence of regional fracture or focal
bone lesion.

Soft tissues and spinal canal: No significant soft tissue finding.

Disc levels: Ordinary mild spondylosis from C3-4 through C6-7. No
apparent compressive canal stenosis. Mild foraminal narrowing at
C3-4, C4-5 and C6-7. facet osteoarthritis right at C7-T1.

Upper chest: Negative

Other: None
IMPRESSION: Mild curvature and degenerative change. No acute or traumatic
finding.

## 2022-01-05 IMAGING — CT CT HEAD W/O CM
4 series · 17 of 47 positions shown, 19 images · non-contrast
Comparison: 10/29/2011

CLINICAL DATA: Found unresponsive on the floor.  Head trauma.

EXAM:
CT HEAD WITHOUT CONTRAST
TECHNIQUE: Contiguous axial images were obtained from the base of the skull
through the vertex without intravenous contrast.

[Series 3: head without · axial · non-contrast · 0.43mm/px · z∈[-346,-226]mm · 7 of 32 slices shown, 9 images]
[im 4/32  brain]
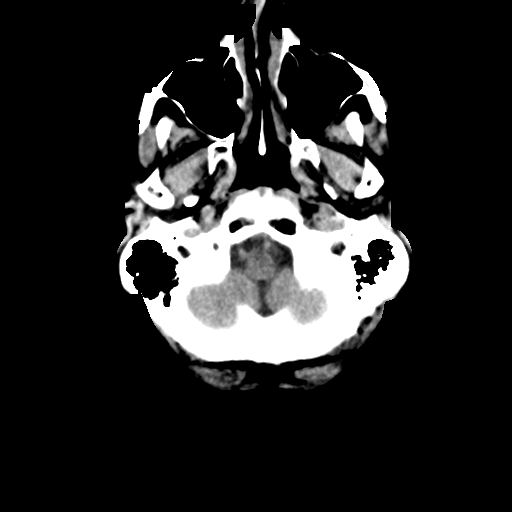
[im 4/32  bone]
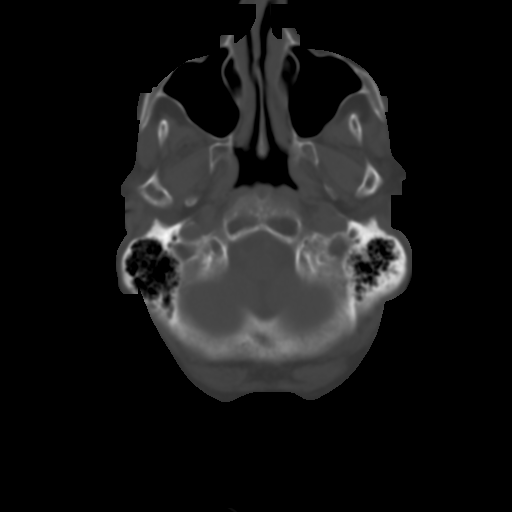
[im 8/32  brain]
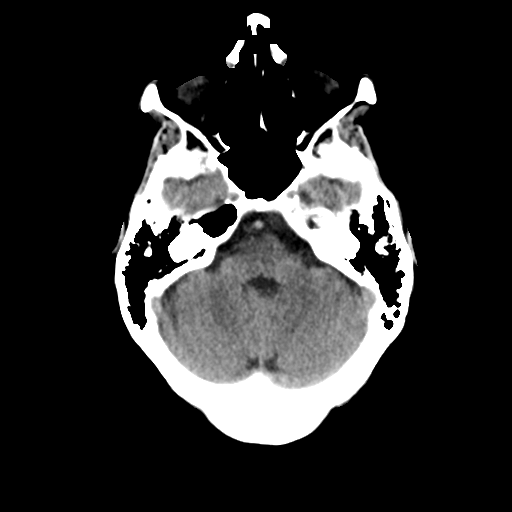
[im 12/32  brain]
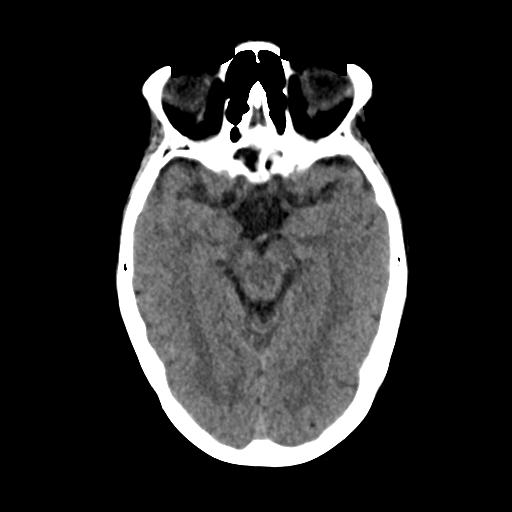
[im 16/32  brain]
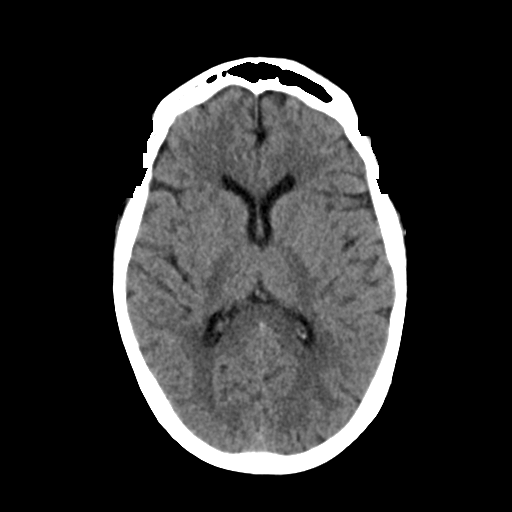
[im 20/32  brain]
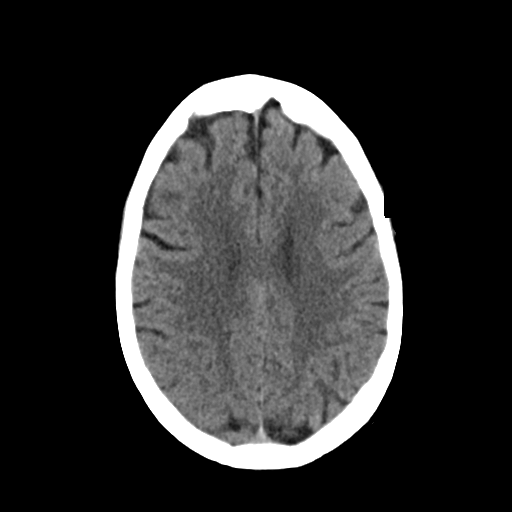
[im 20/32  bone]
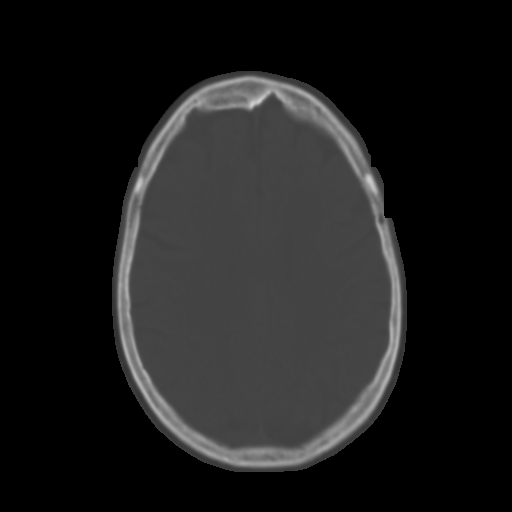
[im 24/32  brain]
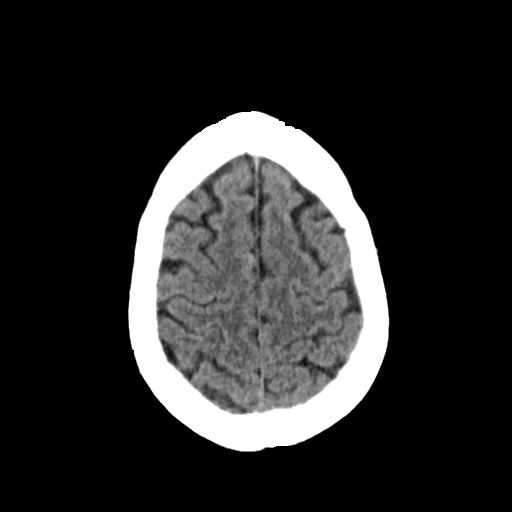
[im 28/32  brain]
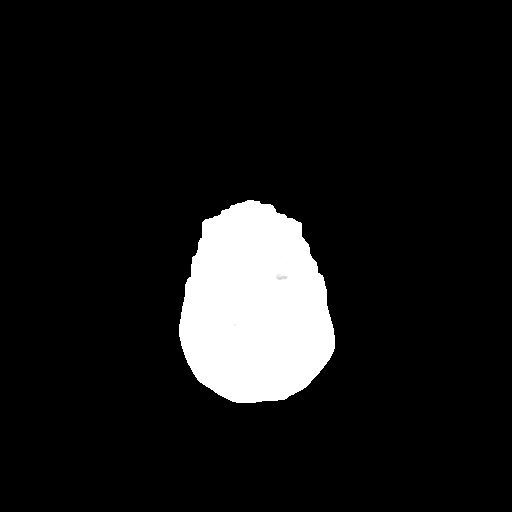

[Series 4: head bone · axial · 0.35mm/px · z∈[-354,-298]mm · 4 of 79 slices shown]
[im 8/79  bone]
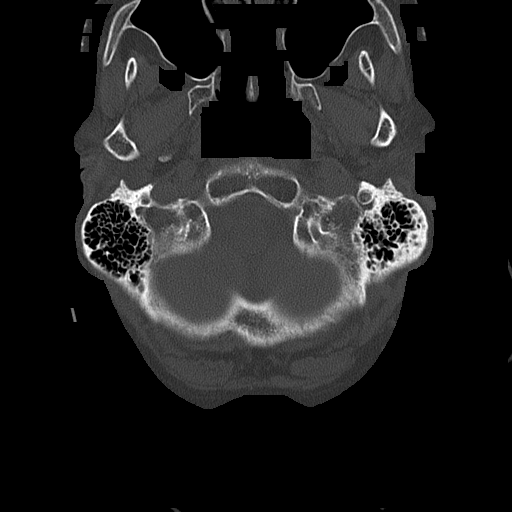
[im 16/79  bone]
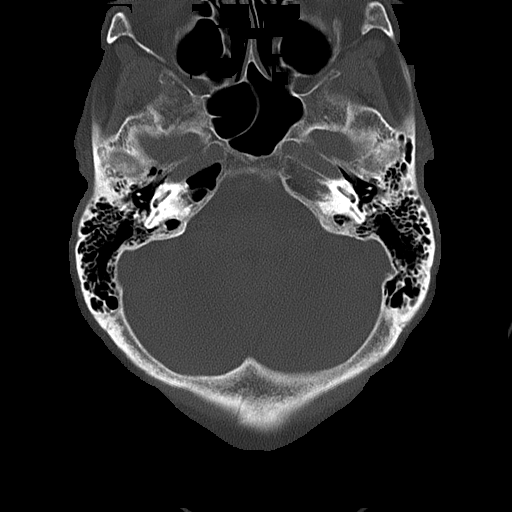
[im 24/79  bone]
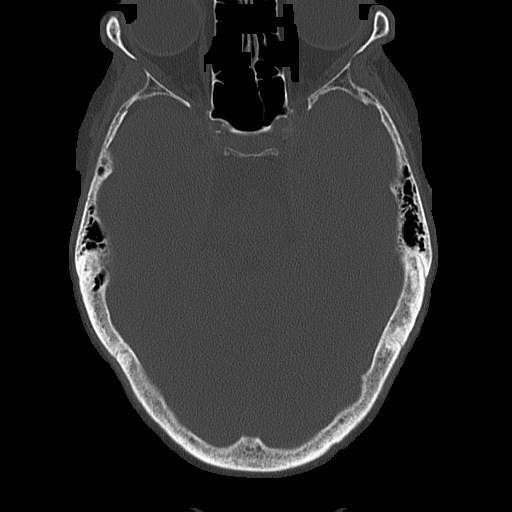
[im 36/79  bone]
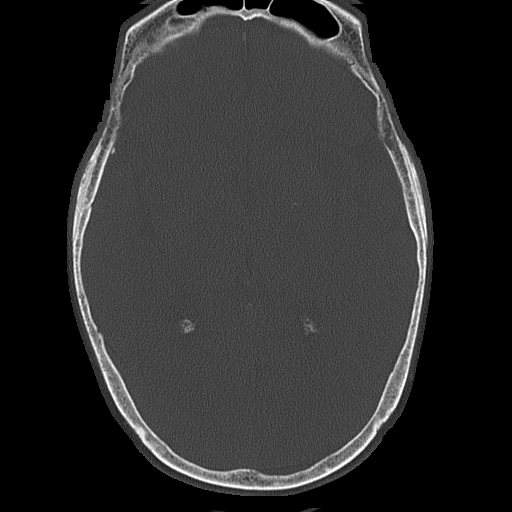

[Series 5: head without cor · coronal · non-contrast · 0.32mm/px · 3 of 64 slices shown]
[im 23/64  brain]
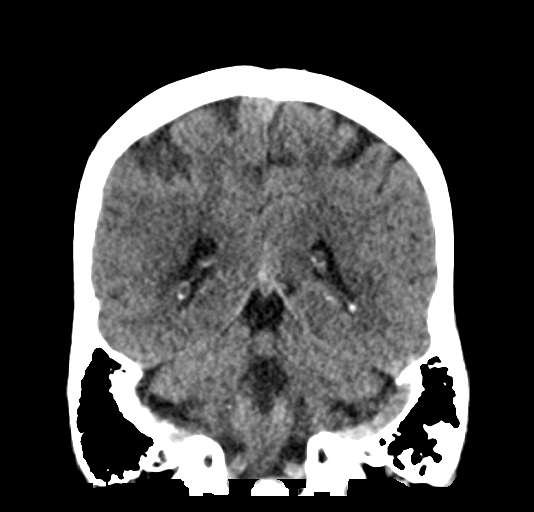
[im 29/64  brain]
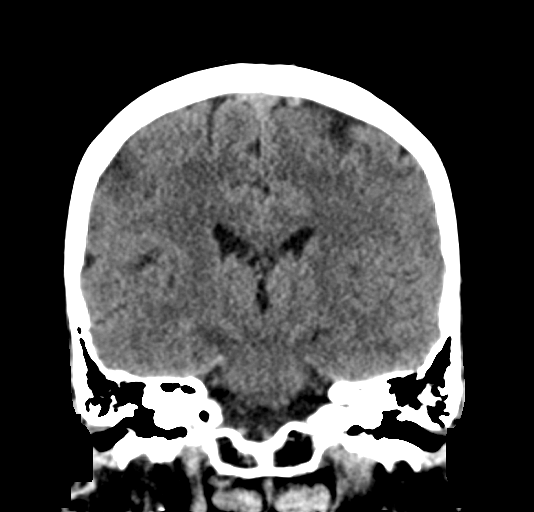
[im 35/64  brain]
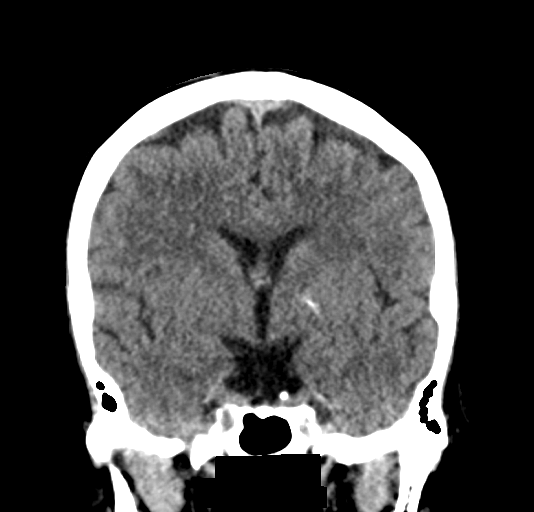

[Series 6: head without sag · sagittal · non-contrast · 0.33mm/px · 3 of 48 slices shown]
[im 16/48  brain]
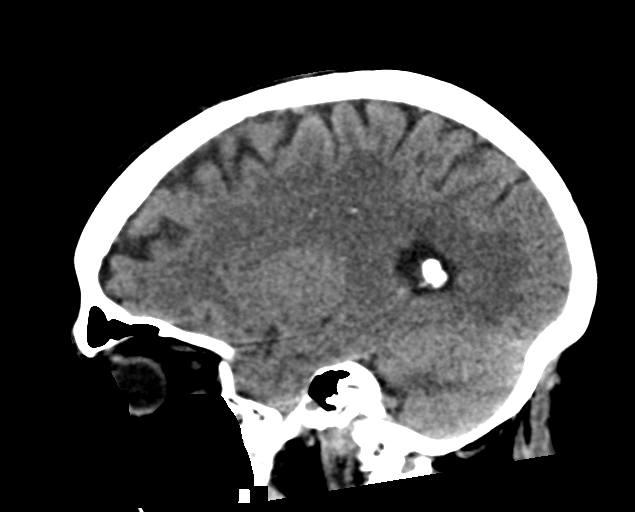
[im 24/48  brain]
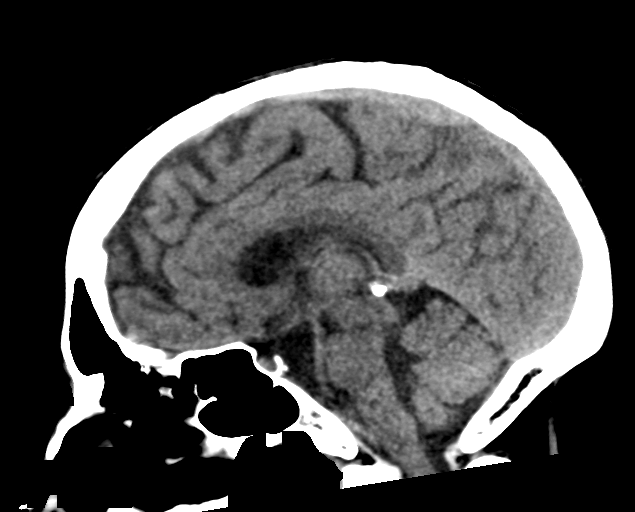
[im 32/48  brain]
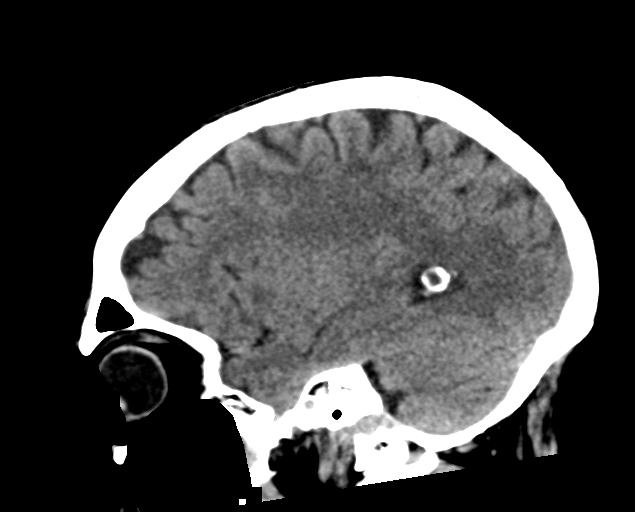

[17 of 47 positions shown; findings below may reference images not displayed]

FINDINGS: Brain: Brain does not show accelerated volume loss. No sign of old
or acute focal infarction, mass lesion, hemorrhage, hydrocephalus or
extra-axial collection.

Vascular: There is atherosclerotic calcification of the major
vessels at the base of the brain.

Skull: Negative.  No skull fracture.

Sinuses/Orbits: Clear/normal

Other: None
IMPRESSION: Normal head CT for age.

## 2022-01-07 ENCOUNTER — Ambulatory Visit
Admission: RE | Admit: 2022-01-07 | Discharge: 2022-01-07 | Disposition: A | Discharge status: Home / Self Care | Payer: Medicare HMO | Source: Ambulatory Visit | Attending: Nurse Practitioner | Admitting: Nurse Practitioner

## 2022-01-07 DIAGNOSIS — C50911 Malignant neoplasm of unspecified site of right female breast: Secondary | ICD-10-CM | POA: Diagnosis present

## 2022-01-07 DIAGNOSIS — Z08 Encounter for follow-up examination after completed treatment for malignant neoplasm: Secondary | ICD-10-CM | POA: Insufficient documentation

## 2022-01-07 DIAGNOSIS — Z853 Personal history of malignant neoplasm of breast: Secondary | ICD-10-CM | POA: Insufficient documentation

## 2022-01-07 DIAGNOSIS — L989 Disorder of the skin and subcutaneous tissue, unspecified: Secondary | ICD-10-CM | POA: Diagnosis not present

## 2022-01-07 LAB — POCT I-STAT CREATININE: Creatinine, Ser: 1 mg/dL (ref 0.44–1.00)

## 2022-01-07 MED ORDER — IOHEXOL 300 MG/ML  SOLN
80.0000 mL | Freq: Once | INTRAMUSCULAR | Status: AC | PRN
  Administered 2022-01-07: 80 mL via INTRAVENOUS

## 2022-01-08 ENCOUNTER — Inpatient Hospital Stay (HOSPITAL_BASED_OUTPATIENT_CLINIC_OR_DEPARTMENT_OTHER): Payer: Medicare HMO | Admitting: Oncology

## 2022-01-08 ENCOUNTER — Encounter: Payer: Self-pay | Admitting: Oncology

## 2022-01-08 ENCOUNTER — Other Ambulatory Visit (HOSPITAL_COMMUNITY): Payer: Self-pay | Admitting: Physician Assistant

## 2022-01-08 ENCOUNTER — Inpatient Hospital Stay (HOSPITAL_BASED_OUTPATIENT_CLINIC_OR_DEPARTMENT_OTHER): Payer: Medicare HMO | Admitting: Hospice and Palliative Medicine

## 2022-01-08 ENCOUNTER — Telehealth: Payer: Self-pay | Admitting: *Deleted

## 2022-01-08 VITALS — BP 114/70 | HR 87 | Temp 96.8°F | Ht 61.75 in | Wt 109.0 lb

## 2022-01-08 DIAGNOSIS — Z853 Personal history of malignant neoplasm of breast: Secondary | ICD-10-CM

## 2022-01-08 DIAGNOSIS — C50911 Malignant neoplasm of unspecified site of right female breast: Secondary | ICD-10-CM | POA: Diagnosis not present

## 2022-01-08 DIAGNOSIS — Z515 Encounter for palliative care: Secondary | ICD-10-CM

## 2022-01-08 DIAGNOSIS — G301 Alzheimer's disease with late onset: Secondary | ICD-10-CM

## 2022-01-08 DIAGNOSIS — Z08 Encounter for follow-up examination after completed treatment for malignant neoplasm: Secondary | ICD-10-CM

## 2022-01-08 NOTE — Telephone Encounter (Signed)
Called and spoke to Her son, [name] and let him know we have canceled her upcoming mammogram.

## 2022-01-08 NOTE — Progress Notes (Signed)
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  INTERVAL HISTORY: Patient last evaluated in clinic in May 2023.  She returns to clinic for further evaluation and discussion on whether or not to initiate treatment.  Recently she was noted to have a biopsy-proven chest wall recurrence.  She continues to have a poor memory, but much of the history is given by her son and daughter-in-law.  She has no neurologic complaints.  She denies any recent fevers or illnesses.  She has a good appetite and denies weight loss.  She has no chest pain, shortness of breath, cough, or hemoptysis.  She denies any nausea, vomiting, constipation, or diarrhea.  She has no urinary complaints.  Patient offers no further specific complaints today.  REVIEW OF SYSTEMS:   Review of Systems  Constitutional: Negative.  Negative for fever, malaise/fatigue and weight loss.  Respiratory: Negative.  Negative for cough, hemoptysis and shortness of breath.   Cardiovascular: Negative.  Negative for chest pain and leg swelling.  Gastrointestinal: Negative.  Negative for abdominal pain.  Genitourinary: Negative.  Negative for dysuria.  Musculoskeletal: Negative.  Negative for back pain.  Skin: Negative.  Negative for rash.  Neurological: Negative.  Negative for dizziness, sensory change, focal weakness, weakness and headaches.  Psychiatric/Behavioral:  The patient is not nervous/anxious.     As per HPI. Otherwise, a complete review of systems is negative.  PAST MEDICAL HISTORY: Past Medical History:  Diagnosis Date   Acute  cholecystitis 2016   Anemia    Anemia 2016   Anxiety    panic attacks   Anxiety and depression    Bipolar 1 disorder (HCC)    Bowel obstruction ([place]) 2017   Common bile duct dilatation 2015   Dyspnea    Edema    Fibromyalgia    GI bleed 2017   History of kidney stones 01/21/2009   HSV-1 infection    Hyperglycemia    Hyperlipidemia    Hypokalemia    Hypothyroidism    Liver cyst    Malignant neoplasm of right female breast, unspecified estrogen receptor status, unspecified site of breast ([place])    Memory loss    Osteopenia    Panic disorder    Schizophrenia ([place])    Ventral hernia     PAST SURGICAL HISTORY: Past Surgical History:  Procedure Laterality Date   ABDOMINAL HYSTERECTOMY     BREAST BIOPSY Right 01/19/2021   [place] Bx, ribbon, path pending   BREAST BIOPSY Right 01/19/2021   [place] Bx, Venus, path pending   BREAST BIOPSY Right 01/19/2021   [place] Bx, heart, path pending   BREAST BIOPSY Right 01/19/2021   [place] BX Axilla, Coil, path pending   CESAREAN SECTION     CHOLECYSTECTOMY OPEN  11/17/2013   with repair of bile duct-Dr. Marina Gravel   MASTECTOMY MODIFIED RADICAL Right 03/02/2021   Procedure: MASTECTOMY MODIFIED RADICAL;  Surgeon: Benjamine Sprague, DO;  Location: [facility];  Service: General;  Laterality: Right;   OPEN REDUCTION INTERNAL FIXATION (ORIF) DISTAL RADIAL FRACTURE Right 12/20/2014   Procedure: OPEN REDUCTION INTERNAL FIXATION (ORIF) RIGHT DISTAL RADIAL FRACTURE;  Surgeon: Renette Butters, MD;  Location: [facility];  Service: Orthopedics;  Laterality: Right;   STOMACH SURGERY      FAMILY HISTORY: Family History  Problem Relation Age of Onset   Heart disease Father    Breast cancer Paternal Aunt    Kidney disease Paternal 6    Breast cancer Paternal Grandmother    Asthma Daughter    Bladder Cancer Neg Hx     ADVANCED DIRECTIVES (Y/N):  N  HEALTH MAINTENANCE: Social History   Tobacco Use   Smoking status: Former    Packs/day: 0.00    Years:  0.00    Total pack years: 0.00    Types: Cigarettes    Quit date: 11/03/1973    Years since quitting: 48.2    Passive exposure: Past   Smokeless tobacco: Never   Tobacco comments:    Smoked briefly as a teen- lives in secondhand smoke.   Vaping Use   Vaping Use: Never used  Substance Use Topics   Alcohol use: No    Alcohol/week: 0.0 standard drinks of alcohol   Drug use: No     Colonoscopy:  PAP:  Bone density:  Lipid panel:  Allergies  Allergen Reactions   Penicillins Hives    TOLERATED CEFAZOLIN    Current Outpatient Medications  Medication Sig Dispense Refill   alendronate (FOSAMAX) 70 MG tablet Take 1 tablet by mouth every Monday.     ferrous sulfate 325 (65 FE) MG tablet Take 1 tablet by mouth daily with breakfast.     letrozole (FEMARA) 2.5 MG tablet Take 1 tablet (2.5 mg total) by mouth daily. 90 tablet 3   levothyroxine (SYNTHROID, LEVOTHROID) 88 MCG tablet Take 88 mcg by mouth daily before breakfast.     QUEtiapine (SEROQUEL) 25 MG tablet Take 1 tablet (25 mg total) by mouth at bedtime. 30 tablet 1   sertraline (ZOLOFT) 25 MG tablet Take 1 tablet (25 mg total) by mouth daily. 30 tablet 1   Vitamin D, Ergocalciferol, (DRISDOL) 1.25 MG (50000 UNIT) CAPS capsule Take 50,000 Units by mouth once a week.     No current facility-administered medications for this visit.    OBJECTIVE: Vitals:   01/08/22 1045  BP: 114/70  Pulse: 87  Temp: (!) 96.8 F (36 C)  SpO2: 100%     Body mass index is 20.1 kg/m.    ECOG FS:0 - Asymptomatic  General: Well-developed, well-nourished, no acute distress. Eyes: Pink conjunctiva, anicteric sclera. HEENT: Normocephalic, moist mucous membranes. Breast: Right mastectomy with chest wall recurrence noted. Lungs: No audible wheezing or coughing. Heart: Regular rate and rhythm. Abdomen: Soft, nontender, no obvious distention. Musculoskeletal: No edema, cyanosis, or clubbing. Neuro: Alert, answering all questions appropriately.  Cranial nerves grossly intact. Skin: No rashes or petechiae noted. Psych: Flat affect.  LAB RESULTS:  Lab Results  Component Value Date   NA 139 07/18/2021   K 3.8 07/18/2021   CL 107 07/18/2021   CO2 25 07/18/2021   GLUCOSE 131 (H) 07/18/2021   BUN 16 07/18/2021   CREATININE 1.00 01/07/2022   CALCIUM 8.7 (L) 07/18/2021   PROT 7.3 07/18/2021   ALBUMIN 3.8 07/18/2021   AST 19 07/18/2021   ALT 13 07/18/2021   ALKPHOS 115 07/18/2021   BILITOT 0.4 07/18/2021   GFRNONAA >60 07/18/2021   GFRAA 28 (L) 12/18/2015    Lab Results  Component Value Date   WBC 7.0 07/18/2021   NEUTROABS 8.1 (H) 11/20/2013   HGB 11.3 (L) 07/18/2021   HCT 35.0 (L) 07/18/2021  MCV 90.9 07/18/2021   PLT 437 (H) 07/18/2021     STUDIES: No results found.  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  She underwent full mastectomy on December 30, 2021.  Despite recommendations patient refused adjuvant chemotherapy or radiation therapy.  She expressed understanding that this increases her rate of recurrence.  She was placed on letrozole, but unclear of her compliance.  Patient now has had biopsy-proven chest wall recurrence.  CT of the chest, abdomen, and pelvis to assess for systemic disease is pending at time of dictation.  We discussed the possibility of pursuing radiation for local control treatment or possibly adding in [place].  Hospice was also discussed.  Patient initially stated she did not want to do anything, but also stated she wanted to try oral chemotherapy.  Plan is to further discuss with her son and daughter-in-law and return to clinic in 2 weeks with a final decision.  Appreciate palliative care input. Lymphedema: Chronic and unchanged.  I  spent a total of 30 minutes reviewing chart data, face-to-face evaluation with the patient, counseling and coordination of care as detailed above.    Patient expressed understanding and was in agreement with this plan. She also understands that She can call clinic at any time with any questions, concerns, or complaints.    Cancer Staging  Invasive ductal carcinoma of right breast [facility]) Staging form: Breast, AJCC 8th Edition - Pathologic stage from 03/14/2021: Stage IIIB (pT2, pN3a, cM0, G3, ER+, PR+, HER2-) - Signed by [name], MD on 03/14/2021 Stage prefix: Initial diagnosis Histologic grading system: 3 grade system   [name], MD   01/08/2022 11:05 AM

## 2022-01-08 NOTE — Progress Notes (Signed)
[place] [facility] Telephone:(336) [phone] Fax:(336) [phone]   Name: Elizabeth [name] Date: 01/08/2022 MRN: [id number]  DOB: 1950-06-28  Patient Care Team: Sharyne Peach, MD as PCP - General (Family Medicine) Rico Junker, RN as Oncology Nurse Navigator Grayland Ormond, Kathlene November, MD as Consulting Physician (Hematology and Oncology)    REASON FOR CONSULTATION: Elizabeth [name] is a 71 y.o. 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  Patient is status postmastectomy but refused adjuvant treatment with exception of letrozole.  Unfortunately, she had recurrence in the right chest wall.  Palliative care was consulted to help address goals.  SOCIAL HISTORY:     reports that she quit smoking about 48 years ago. Her smoking use included cigarettes. She has been exposed to tobacco smoke. She has never used smokeless tobacco. She reports that she does not drink alcohol and does not use drugs.  Patient lives at home alone.  She has a son who lives in [place].  Patient has a caregiver who comes for 12 hours each week.  ADVANCE DIRECTIVES:  Does not have  CODE STATUS: DNR (DNR order signed on 01/08/2022)  PAST MEDICAL HISTORY: Past Medical History:  Diagnosis Date   Acute cholecystitis 2016   Anemia    Anemia 2016   Anxiety    panic attacks   Anxiety and depression    Bipolar 1 disorder (HCC)    Bowel obstruction ([place]) 2017   Common bile duct dilatation 2015   Dyspnea    Edema    Fibromyalgia    GI bleed 2017   History of kidney stones 01/21/2009   HSV-1 infection    Hyperglycemia    Hyperlipidemia    Hypokalemia    Hypothyroidism    Liver cyst    Malignant neoplasm of right female breast, unspecified estrogen receptor status, unspecified site of breast ([place])    Memory loss     Osteopenia    Panic disorder    Schizophrenia ([place])    Ventral hernia     PAST SURGICAL HISTORY:  Past Surgical History:  Procedure Laterality Date   ABDOMINAL HYSTERECTOMY     BREAST BIOPSY Right 01/19/2021   [place] Bx, ribbon, path pending   BREAST BIOPSY Right 01/19/2021   [place] Bx, Venus, path pending   BREAST BIOPSY Right 01/19/2021   [place] Bx, heart, path pending   BREAST BIOPSY Right 01/19/2021   [place] BX Axilla, Coil, path pending   CESAREAN SECTION     CHOLECYSTECTOMY OPEN  11/17/2013   with repair of bile duct-Dr. Marina Gravel   MASTECTOMY MODIFIED RADICAL Right 03/02/2021   Procedure: MASTECTOMY MODIFIED RADICAL;  Surgeon: Benjamine Sprague, DO;  Location: [facility];  Service: General;  Laterality: Right;   OPEN REDUCTION INTERNAL FIXATION (ORIF) DISTAL RADIAL FRACTURE Right 12/20/2014   Procedure: OPEN REDUCTION INTERNAL FIXATION (ORIF) RIGHT DISTAL RADIAL FRACTURE;  Surgeon: Renette Butters, MD;  Location: [facility];  Service: Orthopedics;  Laterality: Right;   STOMACH SURGERY      HEMATOLOGY/ONCOLOGY HISTORY:  Oncology History  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[place])  01/28/2021 Initial Diagnosis   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[place])   03/14/2021 Cancer Staging   Staging form: Breast, AJCC 8th Edition - Pathologic stage from 03/14/2021: Stage IIIB (pT2, pN3a, cM0, G3, ER+, PR+, HER2-) - Signed by [name], MD on  03/14/2021 Stage prefix: Initial diagnosis Histologic grading system: 3 grade system   03/14/2021 - 03/14/2021 Chemotherapy   Patient is on Treatment Plan : BREAST ADJUVANT DOSE DENSE AC q14d / PACLitaxel q7d       ALLERGIES:  is allergic to penicillins.  MEDICATIONS:  Current Outpatient Medications  Medication Sig Dispense Refill   alendronate (FOSAMAX) 70 MG tablet Take 1 tablet by mouth every Monday.     ferrous sulfate 325 (65 FE) MG tablet Take 1 tablet by mouth daily with breakfast.     letrozole (FEMARA) 2.5 MG tablet Take  1 tablet (2.5 mg total) by mouth daily. 90 tablet 3   levothyroxine (SYNTHROID, LEVOTHROID) 88 MCG tablet Take 88 mcg by mouth daily before breakfast.     QUEtiapine (SEROQUEL) 25 MG tablet Take 1 tablet (25 mg total) by mouth at bedtime. 30 tablet 1   sertraline (ZOLOFT) 25 MG tablet Take 1 tablet (25 mg total) by mouth daily. 30 tablet 1   Vitamin D, Ergocalciferol, (DRISDOL) 1.25 MG (50000 UNIT) CAPS capsule Take 50,000 Units by mouth once a week.     No current facility-administered medications for this visit.    VITAL SIGNS: There were no vitals taken for this visit. There were no vitals filed for this visit.  Estimated body mass index is 20.1 kg/m as calculated from the following:   Height as of an earlier encounter on 01/08/22: 5' 1.75" (1.568 m).   Weight as of an earlier encounter on 01/08/22: 109 lb (49.4 kg).  LABS: CBC:    Component Value Date/Time   WBC 7.0 07/18/2021 1458   HGB 11.3 (L) 07/18/2021 1458   HGB 9.8 (L) 11/20/2013 0452   HCT 35.0 (L) 07/18/2021 1458   HCT 29.4 (L) 11/20/2013 0452   PLT 437 (H) 07/18/2021 1458   PLT 299 11/20/2013 0452   MCV 90.9 07/18/2021 1458   MCV 93 11/20/2013 0452   NEUTROABS 8.1 (H) 11/20/2013 0452   LYMPHSABS 0.7 (L) 11/20/2013 0452   MONOABS 0.7 11/20/2013 0452   EOSABS 0.0 11/20/2013 0452   BASOSABS 0.0 11/20/2013 0452   Comprehensive Metabolic Panel:    Component Value Date/Time   NA 139 07/18/2021 1458   NA 144 11/20/2013 0452   K 3.8 07/18/2021 1458   K 4.0 11/20/2013 0452   CL 107 07/18/2021 1458   CL 110 (H) 11/20/2013 0452   CO2 25 07/18/2021 1458   CO2 27 11/20/2013 0452   BUN 16 07/18/2021 1458   BUN 8 11/20/2013 0452   CREATININE 1.00 01/07/2022 1629   CREATININE 0.91 11/20/2013 0452   GLUCOSE 131 (H) 07/18/2021 1458   GLUCOSE 130 (H) 11/20/2013 0452   CALCIUM 8.7 (L) 07/18/2021 1458   CALCIUM 7.8 (L) 11/20/2013 0452   AST 19 07/18/2021 1458   AST 51 (H) 11/18/2013 0441   ALT 13 07/18/2021 1458    ALT 54 11/18/2013 0441   ALKPHOS 115 07/18/2021 1458   ALKPHOS 96 11/18/2013 0441   BILITOT 0.4 07/18/2021 1458   BILITOT 0.4 11/18/2013 0441   PROT 7.3 07/18/2021 1458   PROT 6.7 11/18/2013 0441   ALBUMIN 3.8 07/18/2021 1458   ALBUMIN 2.8 (L) 11/18/2013 0441    RADIOGRAPHIC STUDIES: No results found.  PERFORMANCE STATUS (ECOG) : 1 - Symptomatic but completely ambulatory  Review of Systems Unless otherwise noted, a complete review of systems is negative.  Physical Exam General: NAD Pulmonary: Unlabored Extremities: no edema, no joint deformities Skin: no rashes Neurological: Weakness  but otherwise nonfocal  IMPRESSION: I met with patient and son.  Patient's daughter-in-law participated via phone.  Patient with biopsy-proven recurrence.  Patient confirms that she has been taking letrozole but son admits that she likely misses at least 6 doses per month.  Patient admits that she previously did not want to pursue more aggressive cancer treatment and is still unsure if she would want to pursue any further treatments.  Plan is for follow-up in 2 weeks for further discussion of goals.  We discussed the difference between palliative care and hospice.  Patient sent home with ACP documents/MOST form.  She has previously communicated a desire to family to be DNR/DNI and reaffirms that decision today.  DNR order signed for patient to take home.  PLAN: -Continue current scope of treatment -DNR/DNI -Follow-up in 2 weeks  Case and plan discussed with Dr. Grayland Ormond   Patient expressed understanding and was in agreement with this plan. She also understands that She can call the clinic at any time with any questions, concerns, or complaints.     Time Total: 20 minutes  Visit consisted of counseling and education dealing with the complex and emotionally intense issues of symptom management and palliative care in the setting of serious and potentially life-threatening illness.Greater  than 50%  of this time was spent counseling and coordinating care related to the above assessment and plan.  Signed by: Altha Harm, PhD, NP-C

## 2022-01-22 ENCOUNTER — Ambulatory Visit: Payer: Medicare HMO | Attending: Oncology | Admitting: Occupational Therapy

## 2022-01-22 DIAGNOSIS — I972 Postmastectomy lymphedema syndrome: Secondary | ICD-10-CM | POA: Insufficient documentation

## 2022-01-22 NOTE — Therapy (Signed)
[facility] [facility] [address], [place], [address] Phone: [phone]   Fax:  [phone]  Occupational Therapy Treatment  Patient Details  Name: Elizabeth [name] MRN: [id number] Date of Birth: December 19, 1950 Referring Provider (OT): Dr Grayland Ormond   Encounter Date: 01/22/2022   OT End of Session - 01/22/22 1700     Visit Number 17    Number of Visits 20    Date for OT Re-Evaluation 04/16/22    OT Start Time 1619    OT Stop Time 1645    OT Time Calculation (min) 26 min    Activity Tolerance Patient tolerated treatment well    Behavior During Therapy  [facility] for tasks assessed/performed             Past Medical History:  Diagnosis Date   Acute cholecystitis 2016   Anemia    Anemia 2016   Anxiety    panic attacks   Anxiety and depression    Bipolar 1 disorder (HCC)    Bowel obstruction ([place]) 2017   Common bile duct dilatation 2015   Dyspnea    Edema    Fibromyalgia    GI bleed 2017   History of kidney stones 01/21/2009   HSV-1 infection    Hyperglycemia    Hyperlipidemia    Hypokalemia    Hypothyroidism    Liver cyst    Malignant neoplasm of right female breast, unspecified estrogen receptor status, unspecified site of breast ([place])    Memory loss    Osteopenia    Panic disorder    Schizophrenia ([place])    Ventral hernia     Past Surgical History:  Procedure Laterality Date   ABDOMINAL HYSTERECTOMY     BREAST BIOPSY Right 01/19/2021   [place] Bx, ribbon, path pending   BREAST BIOPSY Right 01/19/2021   [place] Bx, Venus, path pending   BREAST BIOPSY Right 01/19/2021   [place] Bx, heart, path pending   BREAST BIOPSY Right 01/19/2021   [place] BX Axilla, Coil, path pending   CESAREAN SECTION     CHOLECYSTECTOMY OPEN  11/17/2013   with repair of bile duct-Dr. Marina Gravel   MASTECTOMY MODIFIED RADICAL Right 03/02/2021   Procedure: MASTECTOMY MODIFIED RADICAL;  Surgeon: Benjamine Sprague, DO;  Location: [facility] ORS;  Service:  General;  Laterality: Right;   OPEN REDUCTION INTERNAL FIXATION (ORIF) DISTAL RADIAL FRACTURE Right 12/20/2014   Procedure: OPEN REDUCTION INTERNAL FIXATION (ORIF) RIGHT DISTAL RADIAL FRACTURE;  Surgeon: Renette Butters, MD;  Location: [facility];  Service: Orthopedics;  Laterality: Right;   STOMACH SURGERY      There were no vitals filed for this visit.   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     PLAN:       1. 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  She underwent full mastectomy on December 30, 2021.  Despite recommendation of adjuvant Adriamycin and Cytoxan followed by Taxol, patient continues to be  adamant that she will not undergo chemotherapy.  She has highly hesitant to pursue adjuvant XRT as well.  Given her ER/PR status of her tumor, it was recommended patient also take letrozole which she is considering.  No intervention is needed at this time.  Patient will follow-up in 3 weeks for further evaluation and additional treatment planning.   2. Postoperative pain: Patient was given a prescription for hydrocodone today.  3. Lymphedema: Patient was given a referral to lymphedema clinic as well has an appointment with occupational therapy.    Patient Stated Goals To Get My Right Arm Smaller and Prevent Infection    Currently in Pain? No/denies                 LYMPHEDEMA/ONCOLOGY QUESTIONNAIRE - 01/22/22 0001       Right Upper Extremity Lymphedema   15 cm  Proximal to Olecranon Process 26.5 cm    10 cm Proximal to Olecranon Process 27 cm    Olecranon Process 27 cm    15 cm Proximal to Ulnar Styloid Process 27.2 cm    10 cm Proximal to Ulnar Styloid Process 25 cm    Just Proximal to Ulnar Styloid Process 16.5 cm    Across Hand at [employer] 18 cm                  Pt arrive with her daytime compression sleeve on - caretaker good about assisting pt the days she cames with pump hour  Pt sleeve up to elbow only - tourniquet at elbow and wrist -  Pt know - review with pt and caretaker again donning correctly and wearing  Pt demo understanding and much more ease  " Pt to work smarter not harder" Done one layer bandage from hand to elbow figure 8's with 8 cm short stretch - over daytime compression and to be done only afternoons when caretaker is there  Demo understanding   REINFORCE with pt again - tan compression only for daytime use - NOT night time- can cause tourniquet  MADE HER NOTE TO PUT UP AT BED LAST visit         Ask caretaker to encourage patient to use pump  Pt ed on circaid compression garments benefit -for her maybe because she can wear same compression day and night - just loosen at night time And OT can set tabs for her to adjust velcro Medicare will cover compression sleeves started Jan 2024- WIl contact [place] to see what requirements are and go from there                    OT Education - 01/22/22 1700     Education Details use of pump- donning and wearing of compression- and bandaging with pt and  caregiver    Person(s) Educated Patient;Caregiver(s)    Methods Explanation;Demonstration;Verbal cues;Handout;Tactile cues    Comprehension Verbal cues required;Tactile cues required;Returned demonstration;Verbalized understanding                 OT Long Term Goals - 01/22/22 1705       OT LONG TERM GOAL #1   Title Patient to be independent in home program to tolerate and wearing of  compression bandaging to decrease circumference in the right upper extremity to get measured for compression garments    Baseline Patient had never done bandaging before and showing right upper extremity and thoracic lymphedema.  Upper arm increased by 3 cm and elbow and forearm  3 to 4 cm and wrist 2.5 cm.  10/15/2021:  Pt has not been wearing daytime sleeve for a while, increase in measurements. NOW pt was decrease in circumference 2 wsk ago but pt unable to maintain circumference with homeprogram - not compliant ?    Time 8    Period Weeks    Status On-going    Target Date 03/19/22      OT LONG TERM GOAL #2   Title Patient to be independent and wearing of compression garment to decrease and maintain right upper extremity circumference.    Baseline Patient increased by 3 to 4 cm in right upper extremity compared to left.  Has no pump or compression done in the past.  No knowledge.  10/15/21:  reassessment of measurement increase since 2 wks ago - - caretakers to help pt use her pump and wear sleeve correctly daytime - tubigrip at night time - and bandage over sleeve while caretaker there - get measure for circ aid    Time 12    Period Weeks    Status On-going    Target Date 04/16/22      OT LONG TERM GOAL #3   Title Patient to be independent in use of pneumatic compression pump to supplement daytime compression to maintain circumference and decrease future infection in right upper extremity    Status Deferred                   Plan - 01/22/22 1700     Clinical Impression Statement Patient  referred by Dr. Grayland Ormond for right upper extremity lymphedema.  Patient is  s/p right mastectomy with 11-13 lymph nodes removed.  Pt was seen by this OT in May 23 for bandaging of R UE to decrease lymphedema and circumference in R UE - and fitted with daytime compression sleeve and glove as well as pump. Pt return in Sept 23 with increase of circumference in R UE lymphedema.  Attempted patient with  pump and compression at home but was unable to decongest.  Done bandages for a couple of week with good success but patient could not tolerate it as well and a lot of itching at the elbow.  Patient was set up with a home program again to use her pump twice a day with a light nighttime Tubigrip compression and a sleeve.  Patient has a hard time being compliant with program.  Patient's forearm increased greatly couple of times but the last  2 appt from last year - was able  decrease forearm with the help of caretaker using the pump and wearing the daytime compression caretaker. Pt still having issues being compliant with wearing correct sleeve - MADE her note  for Daysleeve NOT to be wear at night time and tubigrip at night time last visit. Pt follow up today with increase forearm measurements and sleeve up to elbow coming in- Ed and review with pt and caretaker donning of sleeve correcctly and done one layer bandage from hand to elbow - caretaker to do it 3 x wk for few hours over daytime sleeve to decrease circumference- Plan to fit with Circaid compression sleeve that she can wear dayttime and adjust at night time- to increase follow thru with homeprogram. Her existing sleeve is about 8-9 months old. Will contact [place] to get info for Medicare requirement to cover compression garments .  Pt  to decrease or maintain her R UE lymphedema circumference with homeprogram- with pt having caregiver 3 x  wk.    OT Occupational Profile and History Problem Focused Assessment - Including review of records relating to presenting problem    Occupational performance deficits (Please refer to evaluation for details): ADL's;IADL's;Leisure;Play    Body Structure / Function / Physical Skills ADL;Decreased knowledge of precautions;Flexibility;Scar mobility;IADL;Edema    Rehab Potential Good    Clinical Decision Making Limited treatment options, no task modification necessary    Comorbidities Affecting Occupational  Performance: None    Modification or Assistance to Complete Evaluation  No modification of tasks or assist necessary to complete eval    OT Frequency Monthly    OT Duration 12 weeks    OT Treatment/Interventions Self-care/ADL training;Manual lymph drainage;DME and/or AE instruction;Compression bandaging;Scar mobilization;Manual Therapy;Patient/family education    Consulted and Agree with Plan of Care Patient             Patient will benefit from skilled therapeutic intervention in order to improve the following deficits and impairments:   Body Structure / Function / Physical Skills: ADL, Decreased knowledge of precautions, Flexibility, Scar mobility, IADL, Edema       Visit Diagnosis: Postmastectomy lymphedema syndrome    Problem List Patient Active Problem List   Diagnosis Date Noted   Major depressive disorder, recurrent severe without psychotic features ([place]) 04/21/2021   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[place]) 01/28/2021   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   Medical non-compliance 08/17/2020   Hepatic abscess 01/10/2016   History of diabetes mellitus, type II 11/04/2014   History of essential hypertension 11/04/2014   Intra-abdominal and pelvic swelling, mass and lump, unspecified site 11/03/2013    Rosalyn Gess, OTR/L,CLT 01/22/2022, 5:08 PM  [facility] [address], [place], [address] Phone: [phone]   Fax:  [phone]  Name: Elizabeth [name] MRN: [id number] Date of Birth: Oct 01, 1950

## 2022-01-25 ENCOUNTER — Encounter: Payer: Self-pay | Admitting: Oncology

## 2022-01-25 ENCOUNTER — Telehealth: Payer: Self-pay

## 2022-01-25 ENCOUNTER — Ambulatory Visit: Admission: RE | Admit: 2022-01-25 | Payer: Medicare HMO | Source: Ambulatory Visit

## 2022-01-25 ENCOUNTER — Inpatient Hospital Stay: Payer: Medicare HMO | Attending: Oncology | Admitting: Oncology

## 2022-01-25 ENCOUNTER — Inpatient Hospital Stay (HOSPITAL_BASED_OUTPATIENT_CLINIC_OR_DEPARTMENT_OTHER): Payer: Medicare HMO | Admitting: Hospice and Palliative Medicine

## 2022-01-25 VITALS — BP 110/62 | HR 88 | Temp 97.6°F | Resp 20 | Ht 61.75 in | Wt 111.0 lb

## 2022-01-25 DIAGNOSIS — I890 Lymphedema, not elsewhere classified: Secondary | ICD-10-CM | POA: Diagnosis not present

## 2022-01-25 DIAGNOSIS — Z9011 Acquired absence of right breast and nipple: Secondary | ICD-10-CM | POA: Diagnosis not present

## 2022-01-25 DIAGNOSIS — Z87891 Personal history of nicotine dependence: Secondary | ICD-10-CM | POA: Diagnosis not present

## 2022-01-25 DIAGNOSIS — Z803 Family history of malignant neoplasm of breast: Secondary | ICD-10-CM | POA: Insufficient documentation

## 2022-01-25 DIAGNOSIS — C50911 Malignant neoplasm of unspecified site of right female breast: Secondary | ICD-10-CM | POA: Insufficient documentation

## 2022-01-25 DIAGNOSIS — Z79811 Long term (current) use of aromatase inhibitors: Secondary | ICD-10-CM | POA: Diagnosis not present

## 2022-01-25 DIAGNOSIS — Z515 Encounter for palliative care: Secondary | ICD-10-CM | POA: Diagnosis not present

## 2022-01-25 DIAGNOSIS — Z170 Estrogen receptor positive status [ER+]: Secondary | ICD-10-CM | POA: Diagnosis not present

## 2022-01-25 NOTE — Progress Notes (Signed)
[facility]  Telephone:([phone]) [phone] Fax:(336) [phone]  ID: Elizabeth [name] OB: April 26, 1950  MR#: [id number]  BZJ#:[id number]  Patient Care Team: Sharyne Peach, MD as PCP - General (Family Medicine) Rico Junker, RN as Oncology Nurse Navigator Grayland Ormond, Kathlene November, MD as Consulting Physician (Hematology and Oncology)  CHIEF COMPLAINT: Recurrent ER/PR positive, HER2 negative multifocal invasive carcinoma of right breast.  INTERVAL HISTORY: Patient returns to clinic today for further evaluation, discussion of her imaging results, and treatment planning.  She continues to have a poor memory, but much of the history is given by her son and daughter-in-law.  She has no neurologic complaints.  She denies any recent fevers or illnesses.  She has a good appetite and denies weight loss.  She has no chest pain, shortness of breath, cough, or hemoptysis.  She denies any nausea, vomiting, constipation, or diarrhea.  She has no urinary complaints.  Patient offers no specific complaints today.  REVIEW OF SYSTEMS:   Review of Systems  Constitutional: Negative.  Negative for fever, malaise/fatigue and weight loss.  Respiratory: Negative.  Negative for cough, hemoptysis and shortness of breath.   Cardiovascular: Negative.  Negative for chest pain and leg swelling.  Gastrointestinal: Negative.  Negative for abdominal pain.  Genitourinary: Negative.  Negative for dysuria.  Musculoskeletal: Negative.  Negative for back pain.  Skin: Negative.  Negative for rash.  Neurological: Negative.  Negative for dizziness, sensory change, focal weakness, weakness and headaches.  Psychiatric/Behavioral:  The patient is not nervous/anxious.     As per HPI. Otherwise, a complete review of systems is negative.  PAST MEDICAL HISTORY: Past Medical History:  Diagnosis Date   Acute cholecystitis 2016   Anemia    Anemia 2016   Anxiety    panic attacks   Anxiety and depression    Bipolar 1  disorder (HCC)    Bowel obstruction ([place]) 2017   Common bile duct dilatation 2015   Dyspnea    Edema    Fibromyalgia    GI bleed 2017   History of kidney stones 01/21/2009   HSV-1 infection    Hyperglycemia    Hyperlipidemia    Hypokalemia    Hypothyroidism    Liver cyst    Malignant neoplasm of right female breast, unspecified estrogen receptor status, unspecified site of breast ([place])    Memory loss    Osteopenia    Panic disorder    Schizophrenia ([place])    Ventral hernia     PAST SURGICAL HISTORY: Past Surgical History:  Procedure Laterality Date   ABDOMINAL HYSTERECTOMY     BREAST BIOPSY Right 01/19/2021   [place] Bx, ribbon, path pending   BREAST BIOPSY Right 01/19/2021   [place] Bx, Venus, path pending   BREAST BIOPSY Right 01/19/2021   [place] Bx, heart, path pending   BREAST BIOPSY Right 01/19/2021   [place] BX Axilla, Coil, path pending   CESAREAN SECTION     CHOLECYSTECTOMY OPEN  11/17/2013   with repair of bile duct-Dr. Marina Gravel   MASTECTOMY MODIFIED RADICAL Right 03/02/2021   Procedure: MASTECTOMY MODIFIED RADICAL;  Surgeon: Benjamine Sprague, DO;  Location: [facility] ORS;  Service: General;  Laterality: Right;   OPEN REDUCTION INTERNAL FIXATION (ORIF) DISTAL RADIAL FRACTURE Right 12/20/2014   Procedure: OPEN REDUCTION INTERNAL FIXATION (ORIF) RIGHT DISTAL RADIAL FRACTURE;  Surgeon: Renette Butters, MD;  Location: [facility];  Service: Orthopedics;  Laterality: Right;   STOMACH SURGERY      FAMILY HISTORY: Family History  Problem Relation Age of Onset   Heart disease Father    Breast cancer Paternal Aunt    Kidney disease Paternal 38    Breast cancer Paternal Grandmother    Asthma Daughter    Bladder Cancer Neg Hx     ADVANCED DIRECTIVES (Y/N):  N  HEALTH MAINTENANCE: Social History   Tobacco Use   Smoking status: Former    Packs/day: 0.00    Years: 0.00    Total pack years: 0.00    Types: Cigarettes    Quit date: 11/03/1973    Years since quitting: 48.2     Passive exposure: Past   Smokeless tobacco: Never   Tobacco comments:    Smoked briefly as a teen- lives in secondhand smoke.   Vaping Use   Vaping Use: Never used  Substance Use Topics   Alcohol use: No    Alcohol/week: 0.0 standard drinks of alcohol   Drug use: No     Colonoscopy:  PAP:  Bone density:  Lipid panel:  Allergies  Allergen Reactions   Penicillins Hives    TOLERATED CEFAZOLIN    Current Outpatient Medications  Medication Sig Dispense Refill   alendronate (FOSAMAX) 70 MG tablet Take 1 tablet by mouth every Monday.     ferrous sulfate 325 (65 FE) MG tablet Take 1 tablet by mouth daily with breakfast.     letrozole (FEMARA) 2.5 MG tablet Take 1 tablet (2.5 mg total) by mouth daily. 90 tablet 3   levothyroxine (SYNTHROID, LEVOTHROID) 88 MCG tablet Take 88 mcg by mouth daily before breakfast.     QUEtiapine (SEROQUEL) 25 MG tablet Take 1 tablet (25 mg total) by mouth at bedtime. 30 tablet 1   sertraline (ZOLOFT) 25 MG tablet Take 1 tablet (25 mg total) by mouth daily. 30 tablet 1   Vitamin D, Ergocalciferol, (DRISDOL) 1.25 MG (50000 UNIT) CAPS capsule Take 50,000 Units by mouth once a week.     No current facility-administered medications for this visit.    OBJECTIVE: Vitals:   01/25/22 1110  BP: 110/62  Pulse: 88  Resp: 20  Temp: 97.6 F (36.4 C)  SpO2: 100%     Body mass index is 20.47 kg/m.    ECOG FS:0 - Asymptomatic  General: Well-developed, well-nourished, no acute distress. Eyes: Pink conjunctiva, anicteric sclera. HEENT: Normocephalic, moist mucous membranes. Lungs: No audible wheezing or coughing. Heart: Regular rate and rhythm. Abdomen: Soft, nontender, no obvious distention. Musculoskeletal: No edema, cyanosis, or clubbing. Neuro: Alert, answering all questions appropriately. Cranial nerves grossly intact. Skin: No rashes or petechiae noted. Psych: Flat affect.  LAB RESULTS:  Lab Results  Component Value Date   NA 139 07/18/2021   K  3.8 07/18/2021   CL 107 07/18/2021   CO2 25 07/18/2021   GLUCOSE 131 (H) 07/18/2021   BUN 16 07/18/2021   CREATININE 1.00 01/07/2022   CALCIUM 8.7 (L) 07/18/2021   PROT 7.3 07/18/2021   ALBUMIN 3.8 07/18/2021   AST 19 07/18/2021   ALT 13 07/18/2021   ALKPHOS 115 07/18/2021   BILITOT 0.4 07/18/2021   GFRNONAA >60 07/18/2021   GFRAA 28 (L) 12/18/2015    Lab Results  Component Value Date   WBC 7.0 07/18/2021   NEUTROABS 8.1 (H) 11/20/2013   HGB 11.3 (L) 07/18/2021   HCT 35.0 (L) 07/18/2021   MCV 90.9 07/18/2021   PLT 437 (H) 07/18/2021     STUDIES: CT CHEST ABDOMEN PELVIS W CONTRAST  Result Date: 01/09/2022 CLINICAL DATA:  Metastatic disease. Evaluate new right chest wall lesion. Evaluate for possible. Breast cancer. EXAM: CT CHEST, ABDOMEN, AND PELVIS WITH CONTRAST TECHNIQUE: Multidetector CT imaging of the chest, abdomen and pelvis was performed following the standard protocol during bolus administration of intravenous contrast. RADIATION DOSE REDUCTION: This exam was performed according to the departmental dose-optimization program which includes automated exposure control, adjustment of the mA and/or kV according to patient size and/or use of iterative reconstruction technique. CONTRAST:  47m OMNIPAQUE IOHEXOL 300 MG/ML  SOLN COMPARISON:  CT chest abdomen and pelvis 02/08/2021. FINDINGS: CT CHEST FINDINGS Cardiovascular: No significant vascular findings. Normal heart size. No pericardial effusion. Mediastinum/Nodes: No enlarged mediastinal, hilar, or axillary lymph nodes. Thyroid gland, trachea, and esophagus demonstrate no significant findings. Lungs/Pleura: Lungs are clear. No pleural effusion or pneumothorax. Musculoskeletal: Right mastectomy changes are present. There is skin thickening of the anterior right chest wall. There is a new enlarged right axillary lymph node measuring 1.2 x 1.8 cm. There are healed right eighth, ninth, tenth, eleventh and twelfth rib fractures. No  acute fractures are seen. No focal osseous lesions are seen. CT ABDOMEN PELVIS FINDINGS Hepatobiliary: Patient is status post cholecystectomy. There is stable intrahepatic and extrahepatic biliary ductal dilatation. There is a 4.1 cm cyst in the inferior right lobe of the liver similar to prior. No new liver lesions lesions are identified. Pancreas: Unremarkable. No pancreatic ductal dilatation or surrounding inflammatory changes. Spleen: Normal in size without focal abnormality. Adrenals/Urinary Tract: There is a 1.5 cm right adrenal nodule which is unchanged from prior. Left adrenal gland is within normal limits. There cysts in the left kidney measuring up to 2.5 cm. There is no hydronephrosis or perinephric fluid. Bladder is within normal limits. Stomach/Bowel: No evidence of bowel wall thickening, distention, or inflammatory changes. There is a large amount of stool throughout the entire colon. The appendix is surgically absent. Distal gastrectomy changes are again noted. The stomach is otherwise within normal limits. Vascular/Lymphatic: No significant vascular findings are present. No enlarged abdominal or pelvic lymph nodes. Reproductive: Status post hysterectomy. No adnexal masses. Other: No abdominal wall hernia or abnormality. No abdominopelvic ascites. Musculoskeletal: Are degenerative changes of the hips. No focal osseous lesions are seen. IMPRESSION: 1. New enlarged right axillary lymph node worrisome for metastatic disease. 2. Right mastectomy with right anterior chest wall thickening. 3. No other evidence for metastatic disease in the chest, abdomen or pelvis. 4. Stable right adrenal nodule. 5. Stable biliary ductal dilatation status post cholecystectomy. Electronically Signed   By: ARonney AstersM.D.   On: 01/09/2022 00:10    ASSESSMENT: Recurrent ER/PR positive, HER2 negative multifocal invasive carcinoma of right breast.  PLAN:    Recurrent ER/PR positive, HER2 negative multifocal invasive  carcinoma of right breast: Final pathology reviewed and patient noted to have 11 out of 11 lymph nodes positive for disease.  She underwent full mastectomy on December 30, 2021.  Despite recommendations patient refused adjuvant chemotherapy or radiation therapy.  She expressed understanding that this increases her rate of recurrence.  She was placed on letrozole, but unclear of her compliance.  Patient now has had biopsy-proven chest wall recurrence.  CT of the chest, abdomen, and pelvis completed on January 09, 2022 reviewed independently and reported as above revealing only local recurrence of disease with possible right axillary lymph node involvement.  No metastatic disease was noted.  Have recommended pursuing radiation therapy for local control disease.  Systemic therapy, IV or oral, is not recommended.  Patient expressed understanding  that she does not pursue any treatment hospice is an option for her.  A referral has been made to radiation oncology, but patient may or may not attend appointment.  No follow-up has been scheduled at this time.   Lymphedema: Chronic and unchanged.  I spent a total of 30 minutes reviewing chart data, face-to-face evaluation with the patient, counseling and coordination of care as detailed above.   Patient expressed understanding and was in agreement with this plan. She also understands that She can call clinic at any time with any questions, concerns, or complaints.    Cancer Staging  Invasive ductal carcinoma of right breast [facility]) Staging form: Breast, AJCC 8th Edition - Pathologic stage from 03/14/2021: Stage IIIB (pT2, pN3a, cM0, G3, ER+, PR+, HER2-) - Signed by [name], MD on 03/14/2021 Stage prefix: Initial diagnosis Histologic grading system: 3 grade system   [name], MD   01/25/2022 12:11 PM

## 2022-01-25 NOTE — Telephone Encounter (Signed)
Patients son stopped at my desk with concerns about CT scan from 01/07/22. He said he noticed that it said something about cysts and a previous rib fracture and wanted you to take a look to make sure there are not additional concerns that were not mentioned. Please advise.

## 2022-01-25 NOTE — Progress Notes (Signed)
[place] at [facility] Telephone:([phone] [phone]) [phone]   Name: Elizabeth [name] Date: 01/25/2022 MRN: [id number]  DOB: [birth year]  Patient Care Team: Sharyne Peach, MD as PCP - General (Family Medicine) Rico Junker, RN as Oncology Nurse Navigator Grayland Ormond, Kathlene November, MD as Consulting Physician (Hematology and Oncology)    REASON FOR CONSULTATION: Elizabeth [name] is a 72 y.o. 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  Patient is status postmastectomy but refused adjuvant treatment with exception of letrozole.  Unfortunately, she had recurrence in the right chest wall.  Palliative care was consulted to help address goals.  SOCIAL HISTORY:     reports that she quit smoking about 48 years ago. Her smoking use included cigarettes. She has been exposed to tobacco smoke. She has never used smokeless tobacco. She reports that she does not drink alcohol and does not use drugs.  Patient lives at home alone.  She has a son who lives in [place].  Patient has a caregiver who comes for 12 hours each week.  ADVANCE DIRECTIVES:  Does not have  CODE STATUS: DNR (DNR order signed on 01/08/2022)  PAST MEDICAL HISTORY: Past Medical History:  Diagnosis Date   Acute cholecystitis 2016   Anemia    Anemia 2016   Anxiety    panic attacks   Anxiety and depression    Bipolar 1 disorder (HCC)    Bowel obstruction ([place]) 2017   Common bile duct dilatation 2015   Dyspnea    Edema    Fibromyalgia    GI bleed 2017   History of kidney stones 01/21/2009   HSV-1 infection    Hyperglycemia    Hyperlipidemia    Hypokalemia    Hypothyroidism    Liver cyst    Malignant neoplasm of right female breast, unspecified estrogen receptor status, unspecified site of breast ([place])    Memory loss     Osteopenia    Panic disorder    Schizophrenia ([place])    Ventral hernia     PAST SURGICAL HISTORY:  Past Surgical History:  Procedure Laterality Date   ABDOMINAL HYSTERECTOMY     BREAST BIOPSY Right 01/19/2021   [place] Bx, ribbon, path pending   BREAST BIOPSY Right 01/19/2021   [place] Bx, Venus, path pending   BREAST BIOPSY Right 01/19/2021   [place] Bx, heart, path pending   BREAST BIOPSY Right 01/19/2021   [place] BX Axilla, Coil, path pending   CESAREAN SECTION     CHOLECYSTECTOMY OPEN  11/17/2013   with repair of bile duct-Dr. Marina Gravel   MASTECTOMY MODIFIED RADICAL Right 03/02/2021   Procedure: MASTECTOMY MODIFIED RADICAL;  Surgeon: Benjamine Sprague, DO;  Location: [facility];  Service: General;  Laterality: Right;   OPEN REDUCTION INTERNAL FIXATION (ORIF) DISTAL RADIAL FRACTURE Right 12/20/2014   Procedure: OPEN REDUCTION INTERNAL FIXATION (ORIF) RIGHT DISTAL RADIAL FRACTURE;  Surgeon: Renette Butters, MD;  Location: [place];  Service: Orthopedics;  Laterality: Right;   STOMACH SURGERY      HEMATOLOGY/ONCOLOGY HISTORY:  Oncology History  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[place])  01/28/2021 Initial Diagnosis   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[place])   03/14/2021 Cancer Staging   Staging form: Breast, AJCC 8th Edition - Pathologic stage from 03/14/2021: Stage IIIB (pT2, pN3a, cM0, G3, ER+, PR+, HER2-) - Signed by [name], MD on  03/14/2021 Stage prefix: Initial diagnosis Histologic grading system: 3 grade system   03/14/2021 - 03/14/2021 Chemotherapy   Patient is on Treatment Plan : BREAST ADJUVANT DOSE DENSE AC q14d / PACLitaxel q7d       ALLERGIES:  is allergic to penicillins.  MEDICATIONS:  Current Outpatient Medications  Medication Sig Dispense Refill   alendronate (FOSAMAX) 70 MG tablet Take 1 tablet by mouth every Monday.     ferrous sulfate 325 (65 FE) MG tablet Take 1 tablet by mouth daily with breakfast.     letrozole (FEMARA) 2.5 MG tablet Take  1 tablet (2.5 mg total) by mouth daily. 90 tablet 3   levothyroxine (SYNTHROID, LEVOTHROID) 88 MCG tablet Take 88 mcg by mouth daily before breakfast.     QUEtiapine (SEROQUEL) 25 MG tablet Take 1 tablet (25 mg total) by mouth at bedtime. 30 tablet 1   sertraline (ZOLOFT) 25 MG tablet Take 1 tablet (25 mg total) by mouth daily. 30 tablet 1   Vitamin D, Ergocalciferol, (DRISDOL) 1.25 MG (50000 UNIT) CAPS capsule Take 50,000 Units by mouth once a week.     No current facility-administered medications for this visit.    VITAL SIGNS: There were no vitals taken for this visit. There were no vitals filed for this visit.  Estimated body mass index is 20.47 kg/m as calculated from the following:   Height as of an earlier encounter on 01/25/22: 5' 1.75" (1.568 m).   Weight as of an earlier encounter on 01/25/22: 111 lb (50.3 kg).  LABS: CBC:    Component Value Date/Time   WBC 7.0 07/18/2021 1458   HGB 11.3 (L) 07/18/2021 1458   HGB 9.8 (L) 11/20/2013 0452   HCT 35.0 (L) 07/18/2021 1458   HCT 29.4 (L) 11/20/2013 0452   PLT 437 (H) 07/18/2021 1458   PLT 299 11/20/2013 0452   MCV 90.9 07/18/2021 1458   MCV 93 11/20/2013 0452   NEUTROABS 8.1 (H) 11/20/2013 0452   LYMPHSABS 0.7 (L) 11/20/2013 0452   MONOABS 0.7 11/20/2013 0452   EOSABS 0.0 11/20/2013 0452   BASOSABS 0.0 11/20/2013 0452   Comprehensive Metabolic Panel:    Component Value Date/Time   NA 139 07/18/2021 1458   NA 144 11/20/2013 0452   K 3.8 07/18/2021 1458   K 4.0 11/20/2013 0452   CL 107 07/18/2021 1458   CL 110 (H) 11/20/2013 0452   CO2 25 07/18/2021 1458   CO2 27 11/20/2013 0452   BUN 16 07/18/2021 1458   BUN 8 11/20/2013 0452   CREATININE 1.00 01/07/2022 1629   CREATININE 0.91 11/20/2013 0452   GLUCOSE 131 (H) 07/18/2021 1458   GLUCOSE 130 (H) 11/20/2013 0452   CALCIUM 8.7 (L) 07/18/2021 1458   CALCIUM 7.8 (L) 11/20/2013 0452   AST 19 07/18/2021 1458   AST 51 (H) 11/18/2013 0441   ALT 13 07/18/2021 1458   ALT  54 11/18/2013 0441   ALKPHOS 115 07/18/2021 1458   ALKPHOS 96 11/18/2013 0441   BILITOT 0.4 07/18/2021 1458   BILITOT 0.4 11/18/2013 0441   PROT 7.3 07/18/2021 1458   PROT 6.7 11/18/2013 0441   ALBUMIN 3.8 07/18/2021 1458   ALBUMIN 2.8 (L) 11/18/2013 0441    RADIOGRAPHIC STUDIES: CT CHEST ABDOMEN PELVIS W CONTRAST  Result Date: 01/09/2022 CLINICAL DATA:  Metastatic disease. Evaluate new right chest wall lesion. Evaluate for possible. Breast cancer. EXAM: CT CHEST, ABDOMEN, AND PELVIS WITH CONTRAST TECHNIQUE: Multidetector CT imaging of the chest, abdomen and pelvis was performed  following the standard protocol during bolus administration of intravenous contrast. RADIATION DOSE REDUCTION: This exam was performed according to the departmental dose-optimization program which includes automated exposure control, adjustment of the mA and/or kV according to patient size and/or use of iterative reconstruction technique. CONTRAST:  76m OMNIPAQUE IOHEXOL 300 MG/ML  SOLN COMPARISON:  CT chest abdomen and pelvis 02/08/2021. FINDINGS: CT CHEST FINDINGS Cardiovascular: No significant vascular findings. Normal heart size. No pericardial effusion. Mediastinum/Nodes: No enlarged mediastinal, hilar, or axillary lymph nodes. Thyroid gland, trachea, and esophagus demonstrate no significant findings. Lungs/Pleura: Lungs are clear. No pleural effusion or pneumothorax. Musculoskeletal: Right mastectomy changes are present. There is skin thickening of the anterior right chest wall. There is a new enlarged right axillary lymph node measuring 1.2 x 1.8 cm. There are healed right eighth, ninth, tenth, eleventh and twelfth rib fractures. No acute fractures are seen. No focal osseous lesions are seen. CT ABDOMEN PELVIS FINDINGS Hepatobiliary: Patient is status post cholecystectomy. There is stable intrahepatic and extrahepatic biliary ductal dilatation. There is a 4.1 cm cyst in the inferior right lobe of the liver similar to  prior. No new liver lesions lesions are identified. Pancreas: Unremarkable. No pancreatic ductal dilatation or surrounding inflammatory changes. Spleen: Normal in size without focal abnormality. Adrenals/Urinary Tract: There is a 1.5 cm right adrenal nodule which is unchanged from prior. Left adrenal gland is within normal limits. There cysts in the left kidney measuring up to 2.5 cm. There is no hydronephrosis or perinephric fluid. Bladder is within normal limits. Stomach/Bowel: No evidence of bowel wall thickening, distention, or inflammatory changes. There is a large amount of stool throughout the entire colon. The appendix is surgically absent. Distal gastrectomy changes are again noted. The stomach is otherwise within normal limits. Vascular/Lymphatic: No significant vascular findings are present. No enlarged abdominal or pelvic lymph nodes. Reproductive: Status post hysterectomy. No adnexal masses. Other: No abdominal wall hernia or abnormality. No abdominopelvic ascites. Musculoskeletal: Are degenerative changes of the hips. No focal osseous lesions are seen. IMPRESSION: 1. New enlarged right axillary lymph node worrisome for metastatic disease. 2. Right mastectomy with right anterior chest wall thickening. 3. No other evidence for metastatic disease in the chest, abdomen or pelvis. 4. Stable right adrenal nodule. 5. Stable biliary ductal dilatation status post cholecystectomy. Electronically Signed   By: ARonney AstersM.D.   On: 01/09/2022 00:10    PERFORMANCE STATUS (ECOG) : 1 - Symptomatic but completely ambulatory  Review of Systems Unless otherwise noted, a complete review of systems is negative.  Physical Exam General: NAD Pulmonary: Unlabored Extremities: no edema, no joint deformities Skin: no rashes Neurological: Weakness but otherwise nonfocal  IMPRESSION: Follow-up visit today with patient and son.  Patient's daughter-in-law participated via video.  CT of the chest abdomen and  pelvis on 01/04/2022 showed new enlarged right axillary lymph node worrisome for metastatic disease, right anterior chest wall thickening, but no other evidence of distant metastatic disease.  Patient saw Dr. FGrayland Ormondtoday with referral for radiation.  Patient tells me that she does not think she is interested in pursuing cancer treatment and recognizes that the outcome would be probably "death" if she opted to forego treatment entirely.  She is most concerned about transportation and finances despite reassurance by family that those should not be barriers to her care.  Family seems interested in her pursuing XRT.  Patient and family to discuss goals and let u[place]ore[place]now what they decide.  Patient is scheduled to see Dr. CBaruch Gouty  Patient and family are interested in referral to psychology/counseling.  PLAN: -Continue current scope of treatment -Referral to psychology -Patient pending evaluation by radiation oncology -Follow-up telephone visit 3 to 4 weeks  Case and plan discussed with Dr. Grayland Ormond   Patient expressed understanding and was in agreement with this plan. She also understands that She can call the clinic at any time with any questions, concerns, or complaints.     Time Total: 20 minutes  Visit consisted of counseling and education dealing with the complex and emotionally intense issues of symptom management and palliative care in the setting of serious and potentially life-threatening illness.Greater than 50%  of this time was spent counseling and coordinating care related to the above assessment and plan.  Signed by: Altha Harm, PhD, NP-C

## 2022-01-26 ENCOUNTER — Ambulatory Visit (HOSPITAL_COMMUNITY): Payer: Medicare HMO

## 2022-01-26 ENCOUNTER — Ambulatory Visit (HOSPITAL_COMMUNITY)
Admission: RE | Admit: 2022-01-26 | Discharge: 2022-01-26 | Disposition: A | Discharge status: Home / Self Care | Payer: Medicare HMO | Source: Ambulatory Visit | Attending: Physician Assistant | Admitting: Physician Assistant

## 2022-01-26 DIAGNOSIS — G301 Alzheimer's disease with late onset: Secondary | ICD-10-CM | POA: Diagnosis present

## 2022-01-26 DIAGNOSIS — I6782 Cerebral ischemia: Secondary | ICD-10-CM | POA: Insufficient documentation

## 2022-01-29 ENCOUNTER — Other Ambulatory Visit: Payer: Self-pay

## 2022-01-29 ENCOUNTER — Emergency Department
Admission: EM | Admit: 2022-01-29 | Discharge: 2022-01-29 | Disposition: A | Discharge status: Home / Self Care | Payer: Medicare HMO | Attending: Emergency Medicine | Admitting: Emergency Medicine

## 2022-01-29 ENCOUNTER — Emergency Department: Payer: Medicare HMO

## 2022-01-29 DIAGNOSIS — S3210XA Unspecified fracture of sacrum, initial encounter for closed fracture: Secondary | ICD-10-CM | POA: Diagnosis not present

## 2022-01-29 DIAGNOSIS — R4182 Altered mental status, unspecified: Secondary | ICD-10-CM | POA: Diagnosis not present

## 2022-01-29 DIAGNOSIS — W06XXXA Fall from bed, initial encounter: Secondary | ICD-10-CM | POA: Diagnosis not present

## 2022-01-29 DIAGNOSIS — R102 Pelvic and perineal pain: Secondary | ICD-10-CM | POA: Insufficient documentation

## 2022-01-29 DIAGNOSIS — S34139A Unspecified injury to sacral spinal cord, initial encounter: Secondary | ICD-10-CM | POA: Diagnosis present

## 2022-01-29 LAB — COMPREHENSIVE METABOLIC PANEL
ALT: 14 U/L (ref 0–44)
AST: 20 U/L (ref 15–41)
Albumin: 3.5 g/dL (ref 3.5–5.0)
Alkaline Phosphatase: 176 U/L — ABNORMAL HIGH (ref 38–126)
Anion gap: 8 (ref 5–15)
BUN: 21 mg/dL (ref 8–23)
CO2: 26 mmol/L (ref 22–32)
Calcium: 8.6 mg/dL — ABNORMAL LOW (ref 8.9–10.3)
Chloride: 105 mmol/L (ref 98–111)
Creatinine, Ser: 0.75 mg/dL (ref 0.44–1.00)
GFR, Estimated: >60 mL/min (ref >60)
Glucose, Bld: 107 mg/dL — ABNORMAL HIGH (ref 70–99)
Potassium: 3.6 mmol/L (ref 3.5–5.1)
Sodium: 139 mmol/L (ref 135–145)
Total Bilirubin: 0.6 mg/dL (ref 0.3–1.2)
Total Protein: 7.2 g/dL (ref 6.5–8.1)

## 2022-01-29 LAB — URINALYSIS, ROUTINE W REFLEX MICROSCOPIC
Bilirubin Urine: NEGATIVE
Glucose, UA: NEGATIVE mg/dL
Ketones, ur: 5 mg/dL — AB
Nitrite: NEGATIVE
Protein, ur: NEGATIVE mg/dL
Specific Gravity, Urine: 1.019 (ref 1.005–1.030)
pH: 6 (ref 5.0–8.0)

## 2022-01-29 LAB — CBC WITH DIFFERENTIAL/PLATELET
Abs Immature Granulocytes: 0.02 10*3/uL (ref 0.00–0.07)
Basophils Absolute: 0.1 10*3/uL (ref 0.0–0.1)
Basophils Relative: 1 %
Eosinophils Absolute: 0.1 10*3/uL (ref 0.0–0.5)
Eosinophils Relative: 1 %
HCT: 34.1 % — ABNORMAL LOW (ref 36.0–46.0)
Hemoglobin: 10.8 g/dL — ABNORMAL LOW (ref 12.0–15.0)
Immature Granulocytes: 0 %
Lymphocytes Relative: 15 %
Lymphs Abs: 1.1 10*3/uL (ref 0.7–4.0)
MCH: 29.7 pg (ref 26.0–34.0)
MCHC: 31.7 g/dL (ref 30.0–36.0)
MCV: 93.7 fL (ref 80.0–100.0)
Monocytes Absolute: 0.5 10*3/uL (ref 0.1–1.0)
Monocytes Relative: 7 %
Neutro Abs: 5.5 10*3/uL (ref 1.7–7.7)
Neutrophils Relative %: 76 %
Platelets: 390 10*3/uL (ref 150–400)
RBC: 3.64 MIL/uL — ABNORMAL LOW (ref 3.87–5.11)
RDW: 12.9 % (ref 11.5–15.5)
WBC: 7.3 10*3/uL (ref 4.0–10.5)
nRBC: 0 % (ref 0.0–0.2)

## 2022-01-29 MED ORDER — OXYCODONE-ACETAMINOPHEN 5-325 MG PO TABS
1.0000 | ORAL_TABLET | Freq: Once | ORAL | Status: AC
  Administered 2022-01-29: 1 via ORAL
  Filled 2022-01-29: qty 1

## 2022-01-29 MED ORDER — KETOROLAC TROMETHAMINE 15 MG/ML IJ SOLN: 30.0000 mg | Freq: Once | INTRAMUSCULAR | Status: DC

## 2022-01-29 MED ORDER — KETOROLAC TROMETHAMINE 30 MG/ML IJ SOLN
30.0000 mg | Freq: Once | INTRAMUSCULAR | Status: AC
  Administered 2022-01-29: 30 mg via INTRAMUSCULAR
  Filled 2022-01-29: qty 1

## 2022-01-29 NOTE — ED Notes (Signed)
ED Provider at bedside. 
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
## 2022-01-29 NOTE — ED Provider Notes (Signed)
[facility] Provider Note    Event Date/Time   First MD Initiated Contact with Patient 01/29/22 2027     (approximate)   History   Tailbone Pain and Altered Mental Status   HPI  Elizabeth [name] is a 72 y.o. female past medical history significant for bipolar/schizophrenia, invasive carcinoma of the right breast with recurrence and palliative care, who presents to the emergency department for a fall and pelvic pain.  Patient states that she fell out of bed on Christmas and has been having pain to her tailbone since that time.  Pain is worse with ambulating.  Has not taking anything for the pain.  Denies any burning with urination.  Complaining of back pain.  Denies nausea vomiting or fever.  No history of kidney stones.  Caregiver is with the patient who comes 12 hours 3 times a week.  Patient's son lives in [place].     Physical Exam   Triage Vital Signs: ED Triage Vitals  Enc Vitals Group     BP 01/29/22 1350 108/69     Pulse Rate 01/29/22 1350 96     Resp 01/29/22 1350 17     Temp 01/29/22 1350 99 F (37.2 C)     Temp Source 01/29/22 1350 Oral     SpO2 01/29/22 1350 97 %     Weight --      Height --      Head Circumference --      Peak Flow --      Pain Score 01/29/22 1353 6     Pain Loc --      Pain Edu? --      Excl. in [place]? --     Most recent vital signs: Vitals:   01/29/22 1622 01/29/22 2114  BP: 110/78 129/68  Pulse: 88 72  Resp: 14 16  Temp: 98.8 F (37.1 C)   SpO2: 96% 95%    Physical Exam Constitutional:      Appearance: She is well-developed.  HENT:     Head: Atraumatic.  Eyes:     Conjunctiva/sclera: Conjunctivae normal.  Cardiovascular:     Rate and Rhythm: Regular rhythm.  Pulmonary:     Effort: No respiratory distress.  Abdominal:     General: There is no distension.     Tenderness: There is no abdominal tenderness.  Musculoskeletal:        General: Normal range of motion.     Cervical back: Normal range  of motion.     Comments: Tenderness to palpation to the sacral area that is midline.  No midline tenderness to the thoracic spine.  No significant tenderness to bilateral hips.  Skin:    General: Skin is warm.  Neurological:     Mental Status: She is alert. Mental status is at baseline.      IMPRESSION / MDM / ASSESSMENT AND PLAN / ED COURSE  I reviewed the triage vital signs and the nursing notes.  Differential diagnosis including pyelonephritis, urinary tract infection, kidney stones, fracture, ligamentous injury, strain.  No symptoms of cauda equina or epidural compression syndrome.  Do not feel that an emergent MRI is necessary at this time.     RADIOLOGY I independently reviewed imaging, my interpretation of imaging: X-ray imaging with no acute fracture  CT scan with no signs of hydronephrosis.  No signs of kidney stone.  Read as sacral S2 nondisplaced fracture of the spinous process of S2.   Labs (all labs ordered  are listed, but only abnormal results are displayed) Labs interpreted as -  No signs of a GI bleed.  No signs of urinary tract infection.  Creatinine at baseline with no significant electrolyte abnormalities Labs Reviewed  CBC WITH DIFFERENTIAL/PLATELET - Abnormal; Notable for the following components:      Result Value   RBC 3.64 (*)    Hemoglobin 10.8 (*)    HCT 34.1 (*)    All other components within normal limits  COMPREHENSIVE METABOLIC PANEL - Abnormal; Notable for the following components:   Glucose, Bld 107 (*)    Calcium 8.6 (*)    Alkaline Phosphatase 176 (*)    All other components within normal limits  URINALYSIS, ROUTINE W REFLEX MICROSCOPIC - Abnormal; Notable for the following components:   Color, Urine YELLOW (*)    APPearance HAZY (*)    Hgb urine dipstick SMALL (*)    Ketones, ur 5 (*)    Leukocytes,Ua SMALL (*)    Bacteria, UA RARE (*)    Crystals PRESENT (*)    All other components within normal limits      Given Percocet and  Toradol for pain control  Discussed with the son and his wife over the phone.  Discussed NSAIDs and Lidoderm patches.  Discussed scheduling a close follow-up with primary care physician and discussing physical therapy.  Given return precautions for any worsening symptoms.   PROCEDURES:  Critical Care performed: No  Procedures  Patient's presentation is most consistent with acute presentation with potential threat to life or bodily function.   MEDICATIONS ORDERED IN ED: Medications  oxyCODONE-acetaminophen (PERCOCET/ROXICET) 5-325 MG per tablet 1 tablet (1 tablet Oral Given 01/29/22 2107)  ketorolac (TORADOL) 30 MG/ML injection 30 mg (30 mg Intramuscular Given 01/29/22 2107)    FINAL CLINICAL IMPRESSION(S) / ED DIAGNOSES   Final diagnoses:  Closed fracture of sacrum, unspecified fracture morphology, initial encounter ([place])     Rx / DC Orders   ED Discharge Orders     None        Note:  This document was prepared using Dragon voice recognition software and may include unintentional dictation errors.   Nathaniel Man, MD 01/29/22 2206

## 2022-01-29 NOTE — ED Notes (Signed)
Pt has a caregiver at the bedside.

## 2022-01-29 NOTE — ED Triage Notes (Signed)
Pt presents to ED with c/o complaints of tailbone pain. Pt states she fell out of bed.

## 2022-01-29 NOTE — Discharge Instructions (Signed)
You were seen in the emergency department with pain to your tailbone and pain with walking.  You were found to have a small broken bone to your sacral area (tailbone).  Alternate ibuprofen and Tylenol for pain control.  Call your primary care physician tomorrow to schedule close follow-up appointment so they can determine if your symptoms are improving with Tylenol and ibuprofen or if you may need a stronger pain medication.  Lidoderm patches 4% you can use every 12 hours to the area of pain.  These are over-the-counter and can be purchased at a pharmacy.  Pain control:  Ibuprofen (motrin/aleve/advil) - You can take 3-4 tablets (600-800 mg) every 6 hours as needed for pain/fever.  Acetaminophen (tylenol) - You can take 2 extra strength tablets (1000 mg) every 6 hours as needed for pain/fever.  You can alternate these medications or take them together.  Make sure you eat food/drink water when taking these medications.

## 2022-02-04 ENCOUNTER — Ambulatory Visit (HOSPITAL_COMMUNITY): Payer: Medicare HMO

## 2022-02-11 ENCOUNTER — Ambulatory Visit
Admission: RE | Admit: 2022-02-11 | Discharge: 2022-02-11 | Disposition: A | Discharge status: Home / Self Care | Payer: Medicare HMO | Source: Ambulatory Visit | Attending: Radiation Oncology | Admitting: Radiation Oncology

## 2022-02-11 VITALS — BP 123/64 | HR 77 | Temp 96.6°F | Resp 15 | Ht 61.75 in | Wt 112.2 lb

## 2022-02-11 DIAGNOSIS — C50811 Malignant neoplasm of overlapping sites of right female breast: Secondary | ICD-10-CM

## 2022-02-11 NOTE — Consult Note (Signed)
NEW PATIENT EVALUATION  Name: Elizabeth [name]  MRN: [id number]  Date:   02/11/2022     DOB: [birth year]   This 72 y.o. 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  REFERRING PHYSICIAN: Sharyne Peach, MD  CHIEF COMPLAINT: No chief complaint on file.   DIAGNOSIS: There were no encounter diagnoses.   PREVIOUS INVESTIGATIONS:  Pathology reports reviewed CT scan reviewed Clinical notes reviewed  HPI: Patient is a 72 year old female now out close to 1 year having completed right modified radical mastectomy for a pathologic stage IIIc (pT2 pN3a M0 ER/PR positive HER2 negative invasive mammary carcinoma.  Tumor was 2.2 cm with skin involvement with tumor directly invading the dermis.  Lymphatic and vascular invasion was present with margins clear at 7 mm.  There were 11 lymph nodes with macro metastatic disease.  She has developed lymphedema of her right upper extremity and is wearing a sleeve for that.  She refused any adjuvant chemotherapy or radiation at that time.  She has now developed chest wall recurrence biopsy-proven with an erythematous area of tumor involvement.  She also by CT criteria has a new enlarged right axillary lymph node worrisome for metastatic disease.  She does have anterior chest wall thickening with no other evidence of metastatic disease.  MRI of her brain shows no metastatic disease.  She is seen today for radiation oncology opinion.  Most of history and discussion is through her family member.  PLANNED TREATMENT REGIMEN: Right chest wall and peripheral lymphatic radiation  PAST MEDICAL HISTORY:  has a past medical history of Acute cholecystitis (2016), Anemia, Anemia (2016), Anxiety, Anxiety and depression, Bipolar 1 disorder  ([place]), Bowel obstruction ([place]) (2017), Common bile duct dilatation (2015), Dyspnea, Edema, Fibromyalgia, GI bleed (2017), History of kidney stones (01/21/2009), HSV-1 infection, Hyperglycemia, Hyperlipidemia, Hypokalemia, Hypothyroidism, Liver cyst, Malignant neoplasm of right female breast, unspecified estrogen receptor status, unspecified site of breast ([place]), Memory loss, Osteopenia, Panic disorder, Schizophrenia ([place]), and Ventral hernia.    PAST SURGICAL HISTORY:  Past Surgical History:  Procedure Laterality Date   ABDOMINAL HYSTERECTOMY     BREAST BIOPSY Right 01/19/2021   [place] Bx, ribbon, path pending   BREAST BIOPSY Right 01/19/2021   [place] Bx, Venus, path pending   BREAST BIOPSY Right 01/19/2021   [place] Bx, heart, path pending   BREAST BIOPSY Right 01/19/2021   [place] BX Axilla, Coil, path pending   CESAREAN SECTION     CHOLECYSTECTOMY OPEN  11/17/2013   with repair of bile duct-Dr. Marina Gravel   MASTECTOMY MODIFIED RADICAL Right 03/02/2021   Procedure: MASTECTOMY MODIFIED RADICAL;  Surgeon: Benjamine Sprague, DO;  Location: [facility];  Service: General;  Laterality: Right;   OPEN REDUCTION INTERNAL FIXATION (ORIF) DISTAL RADIAL FRACTURE Right 12/20/2014   Procedure: OPEN REDUCTION INTERNAL FIXATION (ORIF) RIGHT DISTAL RADIAL FRACTURE;  Surgeon: Renette Butters, MD;  Location: [facility];  Service: Orthopedics;  Laterality: Right;   STOMACH SURGERY      FAMILY HISTORY: family history includes Asthma in her daughter; Breast cancer in her paternal aunt and paternal grandmother; Heart disease in her father; Kidney disease in her paternal aunt.  SOCIAL HISTORY:  reports that she quit smoking about 48 years ago. Her smoking use included cigarettes. She has been exposed to tobacco smoke. She has never used  smokeless tobacco. She reports that she does not drink alcohol and does not use drugs.  ALLERGIES: Penicillins  MEDICATIONS:  Current Outpatient Medications  Medication Sig Dispense Refill    alendronate (FOSAMAX) 70 MG tablet Take 1 tablet by mouth every Monday.     ferrous sulfate 325 (65 FE) MG tablet Take 1 tablet by mouth daily with breakfast.     letrozole (FEMARA) 2.5 MG tablet Take 1 tablet (2.5 mg total) by mouth daily. 90 tablet 3   levothyroxine (SYNTHROID, LEVOTHROID) 88 MCG tablet Take 88 mcg by mouth daily before breakfast.     QUEtiapine (SEROQUEL) 25 MG tablet Take 1 tablet (25 mg total) by mouth at bedtime. 30 tablet 1   sertraline (ZOLOFT) 25 MG tablet Take 1 tablet (25 mg total) by mouth daily. 30 tablet 1   Vitamin D, Ergocalciferol, (DRISDOL) 1.25 MG (50000 UNIT) CAPS capsule Take 50,000 Units by mouth once a week.     No current facility-administered medications for this encounter.    ECOG PERFORMANCE STATUS:  1 - Symptomatic but completely ambulatory  REVIEW OF SYSTEMS: Patient denies any weight loss, fatigue, weakness, fever, chills or night sweats. Patient denies any loss of vision, blurred vision. Patient denies any ringing  of the ears or hearing loss. No irregular heartbeat. Patient denies heart murmur or history of fainting. Patient denies any chest pain or pain radiating to her upper extremities. Patient denies any shortness of breath, difficulty breathing at night, cough or hemoptysis. Patient denies any swelling in the lower legs. Patient denies any nausea vomiting, vomiting of blood, or coffee ground material in the vomitus. Patient denies any stomach pain. Patient states has had normal bowel movements no significant constipation or diarrhea. Patient denies any dysuria, hematuria or significant nocturia. Patient denies any problems walking, swelling in the joints or loss of balance. Patient denies any skin changes, loss of hair or loss of weight. Patient denies any excessive worrying or anxiety or significant depression. Patient denies any problems with insomnia. Patient denies excessive thirst, polyuria, polydipsia. Patient denies any swollen glands,  patient denies easy bruising or easy bleeding. Patient denies any recent infections, allergies or URI. Patient "s visual fields have not changed significantly in recent time.   PHYSICAL EXAM: BP 123/64   Pulse 77   Temp (!) 96.6 F (35.9 C)   Resp 15   Ht 5' 1.75" (1.568 m)   Wt 112 lb 3.2 oz (50.9 kg)   BMI 20.69 kg/m  Patient status post right modified radical mastectomy there is a area approximately 5 cm in greatest dimension of recurrent metastatic breast cancer involvement.  She does have a sleeve on so identification of lymphedema is difficult.  No evidence of axillary or supraclavicular adenopathy is identified.  Well-developed well-nourished patient in NAD. HEENT reveals PERLA, EOMI, discs not visualized.  Oral cavity is clear. No oral mucosal lesions are identified. Neck is clear without evidence of cervical or supraclavicular adenopathy. Lungs are clear to A&P. Cardiac examination is essentially unremarkable with regular rate and rhythm without murmur rub or thrill. Abdomen is benign with no organomegaly or masses noted. Motor sensory and DTR levels are equal and symmetric in the upper and lower extremities. Cranial nerves II through XII are grossly intact. Proprioception is intact. No peripheral adenopathy or edema is identified. No motor or sensory levels are noted. Crude visual fields are within normal range.  LABORATORY DATA: Pathology reports reviewed    RADIOLOGY RESULTS: CT scan of chest abdomen pelvis  reviewed compatible with above-stated findings   IMPRESSION: Biopsy-proven recurrent breast cancer of the right chest wall and patient close to a year out status post right modified radical mastectomy with no adjuvant treatment in 72 year old female  PLAN: At this time I have recommended chest wall and peripheral lymphatic radiation therapy.  Would take both areas to the 5040 cGy in 28 fractions.  Would boost her chest wall and the axillary lymph node up to 20 Gray using electron  beam and possible mixed photon beam for the axillary node.  Risks and benefits of treatment including skin reaction fatigue alteration blood counts possible inclusion of superficial lung possible worsening of her lymphedema all were discussed in detail with the patient and her family member.  I personally set been given a simulation appointment for later this week.  Patient is reluctant to commit to radiation treatment so we will we will discuss that again at the time of simulation.  I would like to take this opportunity to thank you for allowing me to participate in the care of your patient.Noreene Filbert, MD

## 2022-02-12 ENCOUNTER — Institutional Professional Consult (permissible substitution): Payer: Medicare HMO | Admitting: Radiation Oncology

## 2022-02-12 NOTE — Telephone Encounter (Addendum)
CARE Discharge Report:  Number of Sessions: 8 total  - Will need to be re-referred with clearance from MD to resume exercise program again if wanted by patient. Reason: Continuous weight loss during program. Pt aware.

## 2022-02-13 ENCOUNTER — Ambulatory Visit: Payer: Medicare HMO

## 2022-02-14 ENCOUNTER — Ambulatory Visit
Admission: RE | Admit: 2022-02-14 | Discharge: 2022-02-14 | Disposition: A | Discharge status: Home / Self Care | Payer: Medicare HMO | Source: Ambulatory Visit | Attending: Radiation Oncology | Admitting: Radiation Oncology

## 2022-02-14 DIAGNOSIS — Z170 Estrogen receptor positive status [ER+]: Secondary | ICD-10-CM | POA: Insufficient documentation

## 2022-02-14 DIAGNOSIS — C50811 Malignant neoplasm of overlapping sites of right female breast: Secondary | ICD-10-CM | POA: Diagnosis present

## 2022-02-20 ENCOUNTER — Encounter: Payer: Self-pay | Admitting: *Deleted

## 2022-02-20 DIAGNOSIS — C50811 Malignant neoplasm of overlapping sites of right female breast: Secondary | ICD-10-CM | POA: Diagnosis not present

## 2022-02-20 NOTE — Patient Instructions (Signed)
Orders for the following faxed to [facility] supply as requested by [facility] and fitted with Circaid compression garment with hand piece for R UE lymphedema.

## 2022-02-21 ENCOUNTER — Other Ambulatory Visit: Payer: Self-pay | Admitting: *Deleted

## 2022-02-21 DIAGNOSIS — Z170 Estrogen receptor positive status [ER+]: Secondary | ICD-10-CM

## 2022-02-21 NOTE — Progress Notes (Signed)
[place] MD/PA/NP OP Progress Note  02/25/2022 1:36 PM MENDI CONSTABLE  MRN:  [id number]  Chief Complaint:  Chief Complaint  Patient presents with   Follow-up   HPI:  This is a follow-up appointment for bipolar disorder and insomnia.   According to the chart review, she was seen at [facility] clinic 12/2021 "Benjamine Mola Steuart's syndrome is best described as visuospatial/executive, memory and behavioral difficulties that impair her daily function consistent with a diagnosis of dementia. 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 However, she does have multiple other conditions that can be contributing to her symptoms including significant mood disorders, limited formal education, history of learning disability/special education courses, breast cancer with current use of letrozole, HTN, HLD, T2DM, hypothyroidism, GI conditions, low vitamin B12, multiple falls with head injury, chronic use of antipsychotics with current use of Seroquel, medical noncompliance. Notably, patient has family history of Alzheimer's disease (mother)."  "-Discussed and recommended for patient to consider lumbar puncture to further assess for Alzheimer's disease biomarkers via CSF. She would like to discuss further with her oncologist which is certainly reasonable and I advised that if she and family decide they want to pursue to please reach out to me. -Advise against use of cholinesterase inhibitors and memantine at this stage of dementia as can cause more harm than benefit -Continue vitamin B 12 supplementation. Advised patient to follow-up with PCP to ensure she is absorbing it well. Recommend keeping levels > 400 if possible for adequate brain perfusion."   MMSE Total Score 26/30  Orientation 8/10, Registration 3/3, Sequencing 5/5, Recall 2/3, Naming 2/ 2, Repetition 1/ 1, Comprehension 1/1, 3 step command 3 /3, language 1 /1, copy  0/1.   Clock draw: 4/5   Head MRI 01/2022 IMPRESSION: 1.  Mild, age-appropriate generalized volume loss without lobar predilection. 2.  Mild white matter chronic microvascular ischemic changes.    She states that her daughter moved in with her friend.  She states that her friend's mother passed away.  She feels sad as she really liked her. She still has a concern of her being "antichrist." She also has a concern of decrease in po intake due to dental issues. She states that she "cannot enjoy life." She has found out that she needs to do chemotherapy and radiation. She states that "not today, not tomorrow." She does not want to do radiation as she wants to die. She also states that she did not want to have her breast off either. When she is asked if she has SI, she answers "yes and no."  She denies any plans or intent.  Although she wants to clean the house, she feels weak and has not been able to do so.   [name], her son presents to the visit.  He states that she was told to die if she were not to get radiation, although she would live otherwise. She is indecisive and wavering on whether to pursue radiation treatment or not.He states that she fell and fractured her tailbone.  She was not given prescription, and has been recovering from it.  PT comes once a week.  She was seen by [facility] neurology and had brain MRI.  Although she continues to talk about "negative things," being worried about her body, and has paranoia, he does not think there has been any change.  She misses to take medication a few times.  However, she has been approved for a  program which enables her to have in home health.  He believes it would help her to take medication regularly as she would take the medication if somebody gives it to her.  He does not think impatient would help as he believes she is processing things happened, which includes recent loss and past trauma.  He agrees to try higher dose of sertraline this time    Wt  Readings from Last 3 Encounters:  02/25/22 109 lb (49.4 kg)  02/11/22 112 lb 3.2 oz (50.9 kg)  01/25/22 111 lb (50.3 kg)       Visit Diagnosis:    ICD-10-CM   1. Bipolar affective disorder, currently depressed, moderate ([place])  F31.32     2. Insomnia, unspecified type  G47.00       Past Psychiatric History: Please see initial evaluation for full details. I have reviewed the history. No updates at this time.     Past Medical History:  Past Medical History:  Diagnosis Date   Acute cholecystitis 2016   Anemia    Anemia 2016   Anxiety    panic attacks   Anxiety and depression    Bipolar 1 disorder (HCC)    Bowel obstruction ([place]) 2017   Common bile duct dilatation 2015   Dyspnea    Edema    Fibromyalgia    GI bleed 2017   History of kidney stones 01/21/2009   HSV-1 infection    Hyperglycemia    Hyperlipidemia    Hypokalemia    Hypothyroidism    Liver cyst    Malignant neoplasm of right female breast, unspecified estrogen receptor status, unspecified site of breast ([place])    Memory loss    Osteopenia    Panic disorder    Schizophrenia ([place])    Ventral hernia     Past Surgical History:  Procedure Laterality Date   ABDOMINAL HYSTERECTOMY     BREAST BIOPSY Right 01/19/2021   [place] Bx, ribbon, path pending   BREAST BIOPSY Right 01/19/2021   [place] Bx, Venus, path pending   BREAST BIOPSY Right 01/19/2021   [place] Bx, heart, path pending   BREAST BIOPSY Right 01/19/2021   [place] BX Axilla, Coil, path pending   CESAREAN SECTION     CHOLECYSTECTOMY OPEN  11/17/2013   with repair of bile duct-Dr. Marina Gravel   MASTECTOMY MODIFIED RADICAL Right 03/02/2021   Procedure: MASTECTOMY MODIFIED RADICAL;  Surgeon: Benjamine Sprague, DO;  Location: [facility] ORS;  Service: General;  Laterality: Right;   OPEN REDUCTION INTERNAL FIXATION (ORIF) DISTAL RADIAL FRACTURE Right 12/20/2014   Procedure: OPEN REDUCTION INTERNAL FIXATION (ORIF) RIGHT DISTAL RADIAL FRACTURE;  Surgeon: Renette Butters, MD;  Location:  [place];  Service: Orthopedics;  Laterality: Right;   STOMACH SURGERY      Family Psychiatric History: Please see initial evaluation for full details. I have reviewed the history. No updates at this time.     Family History:  Family History  Problem Relation Age of Onset   Heart disease Father    Breast cancer Paternal Aunt    Kidney disease Paternal 65    Breast cancer Paternal Grandmother    Asthma Daughter    Bladder Cancer Neg Hx     Social History:  Social History   Socioeconomic History   Marital status: Married    Spouse name: Not on file   Number of children: 1   Years of education: Not on file   Highest education level: Not on file  Occupational History   Not on file  Tobacco Use   Smoking status: Former    Packs/day: 0.00    Years: 0.00    Total pack years: 0.00    Types: Cigarettes    Quit date: 11/03/1973    Years since quitting: 48.3    Passive exposure: Past   Smokeless tobacco: Never   Tobacco comments:    Smoked briefly as a teen- lives in secondhand smoke.   Vaping Use   Vaping Use: Never used  Substance and Sexual Activity   Alcohol use: No    Alcohol/week: 0.0 standard drinks of alcohol   Drug use: No   Sexual activity: Not Currently  Other Topics Concern   Not on file  Social History Narrative   Not on file   Social Determinants of Health   Financial Resource Strain: Not on file  Food Insecurity: Not on file  Transportation Needs: Not on file  Physical Activity: Not on file  Stress: Not on file  Social Connections: Not on file    Allergies:  Allergies  Allergen Reactions   Penicillins Hives    TOLERATED CEFAZOLIN    Metabolic Disorder Labs: No results found for: "HGBA1C", "MPG" No results found for: "PROLACTIN" No results found for: "CHOL", "TRIG", "HDL", "CHOLHDL", "VLDL", "LDLCALC" No results found for: "TSH"  Therapeutic Level Labs: No results found for: "LITHIUM" Lab Results  Component Value  Date   VALPROATE <10 (L) 07/18/2021   No results found for: "CBMZ"  Current Medications: Current Outpatient Medications  Medication Sig Dispense Refill   alendronate (FOSAMAX) 70 MG tablet Take 1 tablet by mouth every Monday.     ferrous sulfate 325 (65 FE) MG tablet Take 1 tablet by mouth daily with breakfast.     letrozole (FEMARA) 2.5 MG tablet Take 1 tablet (2.5 mg total) by mouth daily. 90 tablet 3   levothyroxine (SYNTHROID, LEVOTHROID) 88 MCG tablet Take 88 mcg by mouth daily before breakfast.     Vitamin D, Ergocalciferol, (DRISDOL) 1.25 MG (50000 UNIT) CAPS capsule Take 50,000 Units by mouth once a week.     [START ON 03/01/2022] QUEtiapine (SEROQUEL) 25 MG tablet Take 1 tablet (25 mg total) by mouth at bedtime. 30 tablet 2   sertraline (ZOLOFT) 50 MG tablet Take 1 tablet (50 mg total) by mouth daily. 30 tablet 2   No current facility-administered medications for this visit.     Musculoskeletal: Strength & Muscle Tone: within normal limits Gait & Station:  using a walker Patient leans: N/A  Psychiatric Specialty Exam: Review of Systems  Psychiatric/Behavioral:  Positive for decreased concentration, dysphoric mood and sleep disturbance. Negative for agitation, behavioral problems, confusion, hallucinations, self-injury and suicidal ideas. The patient is nervous/anxious. The patient is not hyperactive.   All other systems reviewed and are negative.   Blood pressure 118/62, pulse 84, temperature 97.8 F (36.6 C), height 5' (1.524 m), weight 109 lb (49.4 kg), SpO2 96 %.Body mass index is 21.29 kg/m.  General Appearance: Fairly Groomed  Eye Contact:  Minimal  Speech:  Clear and Coherent  Volume:  Normal  Mood:  Depressed  Affect:  Appropriate, Congruent, and Restricted  Thought Process:  Coherent  Orientation:  Full (Time, Place, and Person)  Thought Content: Logical and Rumination   Suicidal Thoughts:   "yes and no." Denies plan/intent  Homicidal Thoughts:  No  Memory:   Immediate;   Fair  Judgement:  Poor  Insight:  Lacking  Psychomotor Activity:  Normal  Concentration:  Concentration: Good and Attention Span: Good  Recall:  Poor  Fund of Knowledge: Good  Language: Good  Akathisia:  No  Handed:  Right  AIMS (if indicated): not done  Assets:  Social Support  ADL's:  Intact  Cognition: WNL  Sleep:  Poor   Screenings: GAD-7    Flowsheet Row Office Visit from 02/25/2022 in [place] Office Visit from 12/31/2021 in [place] Office Visit from 11/05/2021 in [place]  Total GAD-7 Score '19 18 21      '$ PHQ2-9    [place] Office Visit from 02/25/2022 in [place] Office Visit from 12/31/2021 in [place] Office Visit from 11/05/2021 in [place] Office Visit from 08/23/2021 in [place] Office Visit from 07/03/2021 in [place]  PHQ-2 Total Score '6 4 2 6 5  '$ PHQ-9 Total Score '18 16 11 20 20      '$ [place] Office Visit from 02/25/2022 in [place] ED from 01/29/2022 in [facility] Emergency Department [facility] Visit from 12/31/2021 in [place] No Risk No Risk Error: Q7 should not be populated when Q6 is No        Assessment and Plan:  TATIA PETRUCCI is a 72 y.o. 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   1. Bipolar affective disorder, currently depressed, moderate ([place]) 2. Insomnia, unspecified  type R/o MDD with psychotic features Unstable.  She continues to demonstrate disorganized thought process,  fixating on the notion of being the "antichrist," and remains preoccupied with somatic symptoms. Differentials includes depression, bipolar disorder (has this diagnosis according to her son, although she has no known manic symptoms), and/or early manifestation of neuropsychiatric symptoms secondary to underlying neurocognitive disorder.  Will uptitrate sertraline to optimize treatment for depression.  Will continue current dose of quetiapine to target depression and the rumination.   # capacity to refuse radiation Discussions with her son have raised concerns about the influence of her current mental state, which includes depression and potential dementia, on her decision-making on refusing radiation. Although the possibility of electroconvulsive therapy (ECT) or inpatient treatment to ensure medication adherence has been considered, her son reports that she has been approved for a program providing additional in-home health support. He believes her adherence to treatment will improve with the presence of a caregiver and is hopeful about the impact of this home health program. Ongoing assessment of this situation will be conducted.    R/o cognitive impairment - she was seen at [facility] clinic 12/2021. MMSE Total Score 26/30 per chart. Dx r/o late onset alzheimer. (MOCA 14 in 10/2021) A recent MRI has ruled out metastasis, and no significant abnormalities were observed to explain her cognitive impairment, except for age-appropriate volume loss and microvascular changes. It is noteworthy that there has been a considerable improvement in cognition scores, although MOCA and MMSE are not directly comparable. The potential differentials include Lewy body dementia, Alzheimer's disease, and/or depression. Further evaluation will be deferred at this time, and she has an upcoming appointment to discuss the  situation.  Plan (her son  was advised to contact the clinic if she needs any refills) Increase sertraline 50 mg daily Continue quetiapine 25 mg at night (436 msec 02/2021) Obtain EKG to monitor Qtc prolongation Next appointment: 4/15 at 10 AM, IP  Her son agrees to come together - she sees a therapist, Tiffany summers LCSW, weekly [facility],  - consent form to have 2-way conversation with her son, [name] is on file. -Discussed with her son to bring her to the hospital if any concern of continued decrease in appetite, any danger to self/others.     Addendum (late entry): Communicated with her oncologist, Beckey Rutter, NP on 01/01/2022. There is a recurrence of breast cancer. She understands her current medical condition per provider. She will be seen by Dr. Grayland Ormond and Altha Harm, NP. They may take head CT scan. Will consider enhance head MRI if it not done at [place] clinic/upcoming appointment.     I have reviewed suicide assessment in detail. No change in the following assessment.    The patient demonstrates the following risk factors for suicide: Chronic risk factors for suicide include: psychiatric disorder of depression . Acute risk factors for suicide include: unemployment, social withdrawal/isolation, and recent suicide attempt/discharges from inpatient psychiatry. Protective factors for this patient include: positive social support. Considering these factors, the overall suicide risk at this point appears to be moderate, but not at imminent risk. There are no guns in the house. Patient is appropriate for outpatient follow up.       Collaboration of Care: Collaboration of Care: Other reviewed notes in Epic  Patient/Guardian was advised Release of Information must be obtained prior to any record release in order to collaborate their care with an outside provider. Patient/Guardian was advised if they have not already done so to contact the registration department  to sign all necessary forms in order for [place] to release information regarding their care.   Consent: Patient/Guardian gives verbal consent for treatment and assignment of benefits for services provided during this visit. Patient/Guardian expressed understanding and agreed to proceed.    Norman Clay, MD 02/25/2022, 1:36 PM

## 2022-02-22 ENCOUNTER — Inpatient Hospital Stay (HOSPITAL_BASED_OUTPATIENT_CLINIC_OR_DEPARTMENT_OTHER): Payer: Medicare HMO | Admitting: Hospice and Palliative Medicine

## 2022-02-22 ENCOUNTER — Encounter: Payer: Self-pay | Admitting: Licensed Clinical Social Worker

## 2022-02-22 DIAGNOSIS — C50911 Malignant neoplasm of unspecified site of right female breast: Secondary | ICD-10-CM | POA: Diagnosis not present

## 2022-02-22 DIAGNOSIS — Z170 Estrogen receptor positive status [ER+]: Secondary | ICD-10-CM | POA: Insufficient documentation

## 2022-02-22 NOTE — Progress Notes (Signed)
Virtual Visit via Telephone Note  I connected with Elizabeth [name] on 02/22/22 at  1:20 PM EST by telephone and verified that I am speaking with the correct person using two identifiers.  Location: Patient: Home Provider: Clinic   I discussed the limitations, risks, security and privacy concerns of performing an evaluation and management service by telephone and the availability of in person appointments. I also discussed with the patient that there may be a patient responsible charge related to this service. The patient expressed understanding and agreed to proceed.   History of Present Illness: Elizabeth [name] is a 72 y.o. 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  Patient is status postmastectomy but refused adjuvant treatment with exception of letrozole.  Unfortunately, she had recurrence in the right chest wall.  Palliative care was consulted to help address goals.    Observations/Objective: I spoke with patient and son via phone.  Patient reports that she is doing about the same.  She saw Dr. Baruch Gouty and underwent simulation to start XRT next week.  However, patient says that she is still reluctant to proceed with treatment and would prefer hospice.  Her family would like to try treatment and then have her stop if it does not go well.  They will continue discussing goals together.  I asked son to call us if patient ultimately decides not to proceed with further treatment.  Son asked about support groups and community support.  Will refer to social work and palliative care.  Assessment and Plan: Recurrent ER/PR positive, HER2 negative breast cancer -pending XRT but patient unsure if she will ultimately proceed with treatment.  She is considering hospice.  Son will let us know what patient decides.  Will send referral to social work and  community palliative care per her son's request.  Follow Up Instructions: Follow-up telephone visit 1 to 2 months   I discussed the assessment and treatment plan with the patient. The patient was provided an opportunity to ask questions and all were answered. The patient agreed with the plan and demonstrated an understanding of the instructions.   The patient was advised to call back or seek an in-person evaluation if the symptoms worsen or if the condition fails to improve as anticipated.  I provided 10 minutes of non-face-to-face time during this encounter.   Irean Hong, NP

## 2022-02-22 NOTE — Progress Notes (Signed)
Finger Work  Clinical Social Work was referred by medical provider for assessment of psychosocial needs.  Clinical Social Worker attempted to contact patient by phone  to offer support and assess for needs.  CSW left voicemail for patient's son and primary caregiver Avira Tillison [phone] with contact information and request for return call.  CSW left information about [facility], and other events and groups.  CSW will mail the February events/groups calendar.    Adelene Amas, [place] Worker [facility]

## 2022-02-25 ENCOUNTER — Ambulatory Visit (INDEPENDENT_AMBULATORY_CARE_PROVIDER_SITE_OTHER): Payer: Medicare HMO | Admitting: Psychiatry

## 2022-02-25 ENCOUNTER — Telehealth: Payer: Self-pay

## 2022-02-25 ENCOUNTER — Encounter: Payer: Self-pay | Admitting: Psychiatry

## 2022-02-25 ENCOUNTER — Ambulatory Visit: Admission: RE | Admit: 2022-02-25 | Payer: Medicare HMO | Source: Ambulatory Visit

## 2022-02-25 VITALS — BP 118/62 | HR 84 | Temp 97.8°F | Ht 60.0 in | Wt 109.0 lb

## 2022-02-25 DIAGNOSIS — C50811 Malignant neoplasm of overlapping sites of right female breast: Secondary | ICD-10-CM | POA: Insufficient documentation

## 2022-02-25 DIAGNOSIS — F3132 Bipolar disorder, current episode depressed, moderate: Secondary | ICD-10-CM

## 2022-02-25 DIAGNOSIS — G4700 Insomnia, unspecified: Secondary | ICD-10-CM

## 2022-02-25 DIAGNOSIS — Z170 Estrogen receptor positive status [ER+]: Secondary | ICD-10-CM | POA: Insufficient documentation

## 2022-02-25 MED ORDER — SERTRALINE HCL 50 MG PO TABS: 50.0000 mg | ORAL_TABLET | Freq: Every day | ORAL | 2 refills | Status: DC

## 2022-02-25 MED ORDER — QUETIAPINE FUMARATE 25 MG PO TABS: 25.0000 mg | ORAL_TABLET | Freq: Every day | ORAL | 2 refills | Status: DC

## 2022-02-25 NOTE — Telephone Encounter (Signed)
Palliative care outreach to patients, [name], to schedule initial PC visit.  Call unsuccessful. PC SW LVM with contact information.

## 2022-02-25 NOTE — Patient Instructions (Signed)
Increase sertraline 50 mg daily Continue quetiapine 25 mg at night  Obtain EKG to monitor Qtc prolongation Next appointment: 4/15 at 10 AM

## 2022-02-26 ENCOUNTER — Other Ambulatory Visit: Payer: Self-pay

## 2022-02-26 ENCOUNTER — Ambulatory Visit: Payer: Medicare HMO | Admitting: Psychologist

## 2022-02-26 ENCOUNTER — Telehealth: Payer: Self-pay

## 2022-02-26 ENCOUNTER — Ambulatory Visit
Admission: RE | Admit: 2022-02-26 | Discharge: 2022-02-26 | Disposition: A | Discharge status: Home / Self Care | Payer: Medicare HMO | Source: Ambulatory Visit | Attending: Radiation Oncology | Admitting: Radiation Oncology

## 2022-02-26 DIAGNOSIS — C50811 Malignant neoplasm of overlapping sites of right female breast: Secondary | ICD-10-CM | POA: Diagnosis not present

## 2022-02-26 LAB — RAD ONC ARIA SESSION SUMMARY
Course Elapsed Days: 0
Plan Fractions Treated to Date: 1
Plan Prescribed Dose Per Fraction: 1.8 Gy
Plan Total Fractions Prescribed: 28
Plan Total Prescribed Dose: 50.4 Gy
Reference Point Dosage Given to Date: 1.8 Gy
Reference Point Session Dosage Given: 1.8 Gy
Session Number: 1

## 2022-02-26 NOTE — Telephone Encounter (Signed)
Palliative care outreach to patients son, [name], to schedule initial PC visit.   Visit scheduled for 03/14/22 '@10'$ , per son request, with RN/SW

## 2022-02-27 ENCOUNTER — Inpatient Hospital Stay: Payer: Medicare HMO

## 2022-02-27 ENCOUNTER — Ambulatory Visit
Admission: RE | Admit: 2022-02-27 | Discharge: 2022-02-27 | Disposition: A | Discharge status: Home / Self Care | Payer: Medicare HMO | Source: Ambulatory Visit | Attending: Radiation Oncology | Admitting: Radiation Oncology

## 2022-02-27 ENCOUNTER — Other Ambulatory Visit: Payer: Self-pay

## 2022-02-27 DIAGNOSIS — C50811 Malignant neoplasm of overlapping sites of right female breast: Secondary | ICD-10-CM | POA: Diagnosis not present

## 2022-02-27 LAB — RAD ONC ARIA SESSION SUMMARY
Course Elapsed Days: 1
Plan Fractions Treated to Date: 2
Plan Prescribed Dose Per Fraction: 1.8 Gy
Plan Total Fractions Prescribed: 28
Plan Total Prescribed Dose: 50.4 Gy
Reference Point Dosage Given to Date: 3.6 Gy
Reference Point Session Dosage Given: 1.8 Gy
Session Number: 2

## 2022-02-28 ENCOUNTER — Ambulatory Visit
Admission: RE | Admit: 2022-02-28 | Discharge: 2022-02-28 | Disposition: A | Discharge status: Home / Self Care | Payer: Medicare HMO | Source: Ambulatory Visit | Attending: Radiation Oncology | Admitting: Radiation Oncology

## 2022-02-28 ENCOUNTER — Other Ambulatory Visit: Payer: Self-pay

## 2022-02-28 DIAGNOSIS — C50811 Malignant neoplasm of overlapping sites of right female breast: Secondary | ICD-10-CM | POA: Diagnosis not present

## 2022-02-28 LAB — RAD ONC ARIA SESSION SUMMARY
Course Elapsed Days: 2
Plan Fractions Treated to Date: 3
Plan Prescribed Dose Per Fraction: 1.8 Gy
Plan Total Fractions Prescribed: 28
Plan Total Prescribed Dose: 50.4 Gy
Reference Point Dosage Given to Date: 5.4 Gy
Reference Point Session Dosage Given: 1.8 Gy
Session Number: 3

## 2022-03-01 ENCOUNTER — Inpatient Hospital Stay: Payer: Medicare HMO

## 2022-03-01 ENCOUNTER — Ambulatory Visit
Admission: RE | Admit: 2022-03-01 | Discharge: 2022-03-01 | Disposition: A | Discharge status: Home / Self Care | Payer: Medicare HMO | Source: Ambulatory Visit | Attending: Radiation Oncology | Admitting: Radiation Oncology

## 2022-03-01 ENCOUNTER — Other Ambulatory Visit: Payer: Self-pay

## 2022-03-01 DIAGNOSIS — C50811 Malignant neoplasm of overlapping sites of right female breast: Secondary | ICD-10-CM | POA: Diagnosis not present

## 2022-03-01 LAB — RAD ONC ARIA SESSION SUMMARY
Course Elapsed Days: 3
Plan Fractions Treated to Date: 4
Plan Prescribed Dose Per Fraction: 1.8 Gy
Plan Total Fractions Prescribed: 28
Plan Total Prescribed Dose: 50.4 Gy
Reference Point Dosage Given to Date: 7.2 Gy
Reference Point Session Dosage Given: 1.8 Gy
Session Number: 4

## 2022-03-04 ENCOUNTER — Other Ambulatory Visit: Payer: Self-pay

## 2022-03-04 ENCOUNTER — Ambulatory Visit
Admission: RE | Admit: 2022-03-04 | Discharge: 2022-03-04 | Disposition: A | Discharge status: Home / Self Care | Payer: Medicare HMO | Source: Ambulatory Visit | Attending: Radiation Oncology | Admitting: Radiation Oncology

## 2022-03-04 DIAGNOSIS — C50811 Malignant neoplasm of overlapping sites of right female breast: Secondary | ICD-10-CM | POA: Diagnosis not present

## 2022-03-04 LAB — RAD ONC ARIA SESSION SUMMARY
Course Elapsed Days: 6
Plan Fractions Treated to Date: 5
Plan Prescribed Dose Per Fraction: 1.8 Gy
Plan Total Fractions Prescribed: 28
Plan Total Prescribed Dose: 50.4 Gy
Reference Point Dosage Given to Date: 9 Gy
Reference Point Session Dosage Given: 1.8 Gy
Session Number: 5

## 2022-03-05 ENCOUNTER — Other Ambulatory Visit: Payer: Self-pay

## 2022-03-05 ENCOUNTER — Inpatient Hospital Stay: Payer: Medicare HMO

## 2022-03-05 ENCOUNTER — Ambulatory Visit
Admission: RE | Admit: 2022-03-05 | Discharge: 2022-03-05 | Disposition: A | Discharge status: Home / Self Care | Payer: Medicare HMO | Source: Ambulatory Visit | Attending: Radiation Oncology | Admitting: Radiation Oncology

## 2022-03-05 DIAGNOSIS — C50811 Malignant neoplasm of overlapping sites of right female breast: Secondary | ICD-10-CM | POA: Diagnosis not present

## 2022-03-05 LAB — RAD ONC ARIA SESSION SUMMARY
Course Elapsed Days: 7
Plan Fractions Treated to Date: 6
Plan Prescribed Dose Per Fraction: 1.8 Gy
Plan Total Fractions Prescribed: 28
Plan Total Prescribed Dose: 50.4 Gy
Reference Point Dosage Given to Date: 10.8 Gy
Reference Point Session Dosage Given: 1.8 Gy
Session Number: 6

## 2022-03-06 ENCOUNTER — Ambulatory Visit
Admission: RE | Admit: 2022-03-06 | Discharge: 2022-03-06 | Disposition: A | Discharge status: Home / Self Care | Payer: Medicare HMO | Source: Ambulatory Visit | Attending: Radiation Oncology | Admitting: Radiation Oncology

## 2022-03-06 ENCOUNTER — Inpatient Hospital Stay: Payer: Medicare HMO

## 2022-03-06 ENCOUNTER — Other Ambulatory Visit: Payer: Self-pay

## 2022-03-06 DIAGNOSIS — C50811 Malignant neoplasm of overlapping sites of right female breast: Secondary | ICD-10-CM | POA: Diagnosis not present

## 2022-03-06 LAB — RAD ONC ARIA SESSION SUMMARY
Course Elapsed Days: 8
Plan Fractions Treated to Date: 7
Plan Prescribed Dose Per Fraction: 1.8 Gy
Plan Total Fractions Prescribed: 28
Plan Total Prescribed Dose: 50.4 Gy
Reference Point Dosage Given to Date: 12.6 Gy
Reference Point Session Dosage Given: 1.8 Gy
Session Number: 7

## 2022-03-07 ENCOUNTER — Other Ambulatory Visit: Payer: Self-pay

## 2022-03-07 ENCOUNTER — Ambulatory Visit
Admission: RE | Admit: 2022-03-07 | Discharge: 2022-03-07 | Disposition: A | Discharge status: Home / Self Care | Payer: Medicare HMO | Source: Ambulatory Visit | Attending: Radiation Oncology | Admitting: Radiation Oncology

## 2022-03-07 DIAGNOSIS — C50811 Malignant neoplasm of overlapping sites of right female breast: Secondary | ICD-10-CM | POA: Diagnosis not present

## 2022-03-07 LAB — RAD ONC ARIA SESSION SUMMARY
Course Elapsed Days: 9
Plan Fractions Treated to Date: 8
Plan Prescribed Dose Per Fraction: 1.8 Gy
Plan Total Fractions Prescribed: 28
Plan Total Prescribed Dose: 50.4 Gy
Reference Point Dosage Given to Date: 14.4 Gy
Reference Point Session Dosage Given: 1.8 Gy
Session Number: 8

## 2022-03-08 ENCOUNTER — Inpatient Hospital Stay: Payer: Medicare HMO

## 2022-03-08 ENCOUNTER — Encounter: Payer: Self-pay | Admitting: *Deleted

## 2022-03-08 ENCOUNTER — Other Ambulatory Visit: Payer: Self-pay

## 2022-03-08 ENCOUNTER — Ambulatory Visit
Admission: RE | Admit: 2022-03-08 | Discharge: 2022-03-08 | Disposition: A | Discharge status: Home / Self Care | Payer: Medicare HMO | Source: Ambulatory Visit | Attending: Radiation Oncology | Admitting: Radiation Oncology

## 2022-03-08 DIAGNOSIS — C50811 Malignant neoplasm of overlapping sites of right female breast: Secondary | ICD-10-CM | POA: Diagnosis not present

## 2022-03-08 LAB — RAD ONC ARIA SESSION SUMMARY
Course Elapsed Days: 10
Plan Fractions Treated to Date: 9
Plan Prescribed Dose Per Fraction: 1.8 Gy
Plan Total Fractions Prescribed: 28
Plan Total Prescribed Dose: 50.4 Gy
Reference Point Dosage Given to Date: 16.2 Gy
Reference Point Session Dosage Given: 1.8 Gy
Session Number: 9

## 2022-03-11 ENCOUNTER — Other Ambulatory Visit: Payer: Self-pay

## 2022-03-11 ENCOUNTER — Ambulatory Visit
Admission: RE | Admit: 2022-03-11 | Discharge: 2022-03-11 | Disposition: A | Discharge status: Home / Self Care | Payer: Medicare HMO | Source: Ambulatory Visit | Attending: Radiation Oncology | Admitting: Radiation Oncology

## 2022-03-11 DIAGNOSIS — C50811 Malignant neoplasm of overlapping sites of right female breast: Secondary | ICD-10-CM | POA: Diagnosis not present

## 2022-03-11 LAB — RAD ONC ARIA SESSION SUMMARY
Course Elapsed Days: 13
Plan Fractions Treated to Date: 10
Plan Prescribed Dose Per Fraction: 1.8 Gy
Plan Total Fractions Prescribed: 28
Plan Total Prescribed Dose: 50.4 Gy
Reference Point Dosage Given to Date: 18 Gy
Reference Point Session Dosage Given: 1.8 Gy
Session Number: 10

## 2022-03-12 ENCOUNTER — Ambulatory Visit
Admission: RE | Admit: 2022-03-12 | Discharge: 2022-03-12 | Disposition: A | Discharge status: Home / Self Care | Payer: Medicare HMO | Source: Ambulatory Visit | Attending: Radiation Oncology | Admitting: Radiation Oncology

## 2022-03-12 ENCOUNTER — Other Ambulatory Visit: Payer: Self-pay

## 2022-03-12 DIAGNOSIS — C50811 Malignant neoplasm of overlapping sites of right female breast: Secondary | ICD-10-CM | POA: Diagnosis not present

## 2022-03-12 LAB — RAD ONC ARIA SESSION SUMMARY
Course Elapsed Days: 14
Plan Fractions Treated to Date: 11
Plan Prescribed Dose Per Fraction: 1.8 Gy
Plan Total Fractions Prescribed: 28
Plan Total Prescribed Dose: 50.4 Gy
Reference Point Dosage Given to Date: 19.8 Gy
Reference Point Session Dosage Given: 1.8 Gy
Session Number: 11

## 2022-03-13 ENCOUNTER — Other Ambulatory Visit: Payer: Self-pay

## 2022-03-13 ENCOUNTER — Ambulatory Visit
Admission: RE | Admit: 2022-03-13 | Discharge: 2022-03-13 | Disposition: A | Discharge status: Home / Self Care | Payer: Medicare HMO | Source: Ambulatory Visit | Attending: Radiation Oncology | Admitting: Radiation Oncology

## 2022-03-13 ENCOUNTER — Inpatient Hospital Stay: Payer: Medicare HMO

## 2022-03-13 DIAGNOSIS — C50811 Malignant neoplasm of overlapping sites of right female breast: Secondary | ICD-10-CM | POA: Diagnosis not present

## 2022-03-13 LAB — RAD ONC ARIA SESSION SUMMARY
Course Elapsed Days: 15
Plan Fractions Treated to Date: 12
Plan Prescribed Dose Per Fraction: 1.8 Gy
Plan Total Fractions Prescribed: 28
Plan Total Prescribed Dose: 50.4 Gy
Reference Point Dosage Given to Date: 21.6 Gy
Reference Point Session Dosage Given: 1.8 Gy
Session Number: 12

## 2022-03-14 ENCOUNTER — Ambulatory Visit
Admission: RE | Admit: 2022-03-14 | Discharge: 2022-03-14 | Disposition: A | Discharge status: Home / Self Care | Payer: Medicare HMO | Source: Ambulatory Visit | Attending: Radiation Oncology | Admitting: Radiation Oncology

## 2022-03-14 ENCOUNTER — Other Ambulatory Visit: Payer: Self-pay

## 2022-03-14 ENCOUNTER — Other Ambulatory Visit: Payer: Medicare HMO

## 2022-03-14 VITALS — BP 118/74 | HR 84 | Temp 97.5°F

## 2022-03-14 DIAGNOSIS — Z515 Encounter for palliative care: Secondary | ICD-10-CM

## 2022-03-14 DIAGNOSIS — C50811 Malignant neoplasm of overlapping sites of right female breast: Secondary | ICD-10-CM | POA: Diagnosis not present

## 2022-03-14 LAB — RAD ONC ARIA SESSION SUMMARY
Course Elapsed Days: 16
Plan Fractions Treated to Date: 13
Plan Prescribed Dose Per Fraction: 1.8 Gy
Plan Total Fractions Prescribed: 28
Plan Total Prescribed Dose: 50.4 Gy
Reference Point Dosage Given to Date: 23.4 Gy
Reference Point Session Dosage Given: 1.8 Gy
Session Number: 13

## 2022-03-14 NOTE — Progress Notes (Signed)
COMMUNITY PALLIATIVE CARE SW NOTE  PATIENT NAME: Elizabeth [name] DOB: 1950-03-15 MRN: [id number]  PRIMARY CARE PROVIDER: Sharyne Peach, MD  RESPONSIBLE PARTY:  Acct ID - Guarantor Home Phone Work Phone Relationship Acct Type  [id number] RAZIAH, [phone]  Self P/F     [address], [place], [place] [address]     PLAN OF CARE and INTERVENTIONS:              GOALS OF CARE/ ADVANCE CARE PLANNING:    Goals include to maximize quality of life and assist with pain management. Our advance care planning conversation included a discussion about:    The value and importance of advance care planning  Review and updating or creation of an advance directive document.                          Code status: DNR.                          Advance directives: none in place.  2.        SOCIAL/EMOTIONAL/SPIRITUAL ASSESSMENT/ INTERVENTIONS:         Palliative care encounter: SW and RN completed initial joint in home visit with patient, son connected via telephone.   Presenting problem: New palliative care referral from Billey Chang NP to discuss [place] and offer support to family and patient. Palliaitve care criteria and services reviewed and consent signed.  Breast cancer: Patient with stage lll breast cancer. Patient is status postmastectomy but refused adjuvant treatment with exception of letrozole. Patient receiving radiation daily for 8 weeks. Patient has 6 more weeks of radiation. Patient and family has decided to not seek chemo treatment.   Appetite: patient states her appetite is poor. Current weight is 109lbs. Patient feesl as though she is losing a lot of weight and not able to eat much. Son shared during call that patient eats 4 meals a day. No supplement drinks being consumed.    Dementia: Patient with Alzheimer's disease with late onset. Son shares that patients memory is declining and becomes fixated on certain things like fiancnes and body appearance.   ADL: continues to live  independently, bathes self in sink and dresses self, can cook meals with. No falls reported. Patient uses rollator. Patient shares that she is feeling weaker.   Hospice: Hospice vs palliative care services has been discussed and educated patient on hospice services. Patient share that she desires hospice services as she wishes to stop raditaion and desires quality of life over qwuantity of life, however she feels her children wants her to continue radiation. Son share that they support patient wishes but want to ensure that patient udnerstands end of life services and support that she currently has may end in the event hospice is introduced. Family concerned with patient neglecting her care due to her desire to pursue end of life services.    Psychosocial assessment: completed.   In home support: patient resides in home independently. Patient has private caregiver that visits 2-3x/wk in the afternoons with VA coverage. Son is working on getting more VA services in the home for patient to proivde support. [facility] also [facility] [place] and [facility].   Transportation: no needs.  Food: no food insecurities witnessed. Patient has MOW's.  Safety and long term planning: patient feels safe in her home and desires to remain in her home.    SW discussed  goals, reviewed care plan, provided emotional support, used active and reflective listening in the form of reciprocity emotional response. Questions and concerns were addressed. The patient/family was encouraged to call with any additional questions and/or concerns. PC Provided general support and encouragement, no other unmet needs identified. Will continue to follow.   3.         PATIENT/CAREGIVER EDUCATION/ COPING:   Appearance: well groomed, appropriate given situation  Mental Status: Alert/oriented. Eye Contact: Good. Able to engage in proper eye contact  Thought Process: rational  Thought Content: not assessed  Speech: normal  Mood:  Normal and calm Affect: Congruent to endorsed mood, full ranging Insight: normal Judgement: normal  Interaction Style: Cooperative   Patient A&O and able to make needs known, patient engaged in fluent conversation and answered all questions appropriately. No cognitive deficits witnessed this visit, however does suffer from alzheimer's.   Mental health: Patient suffers from mood disorder to include bipolar and depression. Patient has hx of SI and suicide attempts, that resulted in IP psych stay, Patient with symptoms of body dismorphia. Patient being followed by psychiatry. Patients MH effects her processing the full over view of her medical complexities and desires to live.   4.         PERSONAL EMERGENCY PLAN:  Patient will call 9-1-1 for emergencies.    5.         COMMUNITY RESOURCES COORDINATION/ HEALTH CARE NAVIGATION:  patient and son manages her care.    6.      FINANCIAL CONCERNS/NEEDS: None                         Primary Health Insurance: [employer] Secondary Health Insurance: none Prescription Coverage: Yes, no history of difficulty obtaining or affording prescriptions reported.     SOCIAL HX:  Social History   Tobacco Use   Smoking status: Former    Packs/day: 0.00    Years: 0.00    Total pack years: 0.00    Types: Cigarettes    Quit date: 11/03/1973    Years since quitting: 48.3    Passive exposure: Past   Smokeless tobacco: Never   Tobacco comments:    Smoked briefly as a teen- lives in secondhand smoke.   Substance Use Topics   Alcohol use: No    Alcohol/week: 0.0 standard drinks of alcohol    CODE STATUS: DNR  ADVANCED DIRECTIVES: N MOST FORM COMPLETE:  N HOSPICE EDUCATION PROVIDED: Y  PPS: patient is independent with ADL's    Time spent: 17 min    [place], [place]

## 2022-03-14 NOTE — Progress Notes (Signed)
PATIENT NAME: Elizabeth [name] DOB: 1950-05-20 MRN: [id number]  PRIMARY CARE PROVIDER: Sharyne Peach, MD  RESPONSIBLE PARTY:  Acct ID - Guarantor Home Phone Work Phone Relationship Acct Type  [id number] SANAYA, [phone]  Self P/F     [address], [place], [place] [address]    Home visit completed with patient, [place], SW and connected with son [name] by phone.   Appetite:  Patient endorses poor appetite.  She states weight loss has occurred over the last several months.  Patient advised she is now 106 lbs and was previously 130 lbs.  Son reports patient is eating 4 meals a day.  She is also receiving meals on wheels.   Mental Health:  Patient shared she tired to take her life and she is struggling with her mental health. Son is currently working with cancer center SW to assist patient.  Patient has declined counseling. Son shared long history of mental health struggles.   Right Breast Ca:  Has lymphedema to the right arm.  Has compression machine present but does not use it regularly.  Compression sleeve placed during this visit.   Patient shared journey with breast cancer and mastectomy.  Currently receiving radiation treatment.  Has 6 more weeks of treatment 5 days a week.   Palliative Care vs Hospice:  Explained Palliative Care services to patient and consents signed.  Explained differences between hospice and palliative care.  Patient advised she was not interested in pursuing radiation treatment and wanted hospice.  Lengthy conversation had with both patient and son.   Resources:  Currently has VA assistance for private caregiver 4 hours a day for 3 days a week.  Also going to [facility] on Wednesday/Friday.     CODE STATUS: DNR ADVANCED DIRECTIVES: No MOST FORM: No PPS: 40%   PHYSICAL EXAM:   VITALS: Today's Vitals   03/14/22 1029  BP: 118/74  Pulse: 84  Temp: (!) 97.5 F (36.4 C)  SpO2: 96%  PainSc: 0-No pain    LUNGS: clear to  auscultation  CARDIAC: Cor RRR}  EXTREMITIES: - for edema SKIN: Skin color, texture, turgor normal. No rashes or lesions or right breast mastectomy with lesion present.    NEURO: positive for gait problems and memory problems       Lorenza Burton, RN

## 2022-03-15 ENCOUNTER — Ambulatory Visit
Admission: RE | Admit: 2022-03-15 | Discharge: 2022-03-15 | Disposition: A | Discharge status: Home / Self Care | Payer: Medicare HMO | Source: Ambulatory Visit | Attending: Radiation Oncology | Admitting: Radiation Oncology

## 2022-03-15 ENCOUNTER — Inpatient Hospital Stay: Payer: Medicare HMO

## 2022-03-15 ENCOUNTER — Encounter: Payer: Self-pay | Admitting: *Deleted

## 2022-03-15 ENCOUNTER — Other Ambulatory Visit: Payer: Self-pay

## 2022-03-15 DIAGNOSIS — C50811 Malignant neoplasm of overlapping sites of right female breast: Secondary | ICD-10-CM | POA: Diagnosis not present

## 2022-03-15 LAB — RAD ONC ARIA SESSION SUMMARY
Course Elapsed Days: 17
Plan Fractions Treated to Date: 14
Plan Prescribed Dose Per Fraction: 1.8 Gy
Plan Total Fractions Prescribed: 28
Plan Total Prescribed Dose: 50.4 Gy
Reference Point Dosage Given to Date: 25.2 Gy
Reference Point Session Dosage Given: 1.8 Gy
Session Number: 14

## 2022-03-18 ENCOUNTER — Telehealth: Payer: Self-pay

## 2022-03-18 ENCOUNTER — Other Ambulatory Visit: Payer: Self-pay

## 2022-03-18 ENCOUNTER — Ambulatory Visit
Admission: RE | Admit: 2022-03-18 | Discharge: 2022-03-18 | Disposition: A | Discharge status: Home / Self Care | Payer: Medicare HMO | Source: Ambulatory Visit | Attending: Radiation Oncology | Admitting: Radiation Oncology

## 2022-03-18 ENCOUNTER — Telehealth: Payer: Self-pay | Admitting: Hospice and Palliative Medicine

## 2022-03-18 DIAGNOSIS — C50811 Malignant neoplasm of overlapping sites of right female breast: Secondary | ICD-10-CM | POA: Diagnosis not present

## 2022-03-18 LAB — RAD ONC ARIA SESSION SUMMARY
Course Elapsed Days: 20
Plan Fractions Treated to Date: 15
Plan Prescribed Dose Per Fraction: 1.8 Gy
Plan Total Fractions Prescribed: 28
Plan Total Prescribed Dose: 50.4 Gy
Reference Point Dosage Given to Date: 27 Gy
Reference Point Session Dosage Given: 1.8 Gy
Session Number: 15

## 2022-03-18 NOTE — Telephone Encounter (Signed)
I received another message from [facility] that patient's son had declined hospice and would continue XRT for now.

## 2022-03-18 NOTE — Telephone Encounter (Signed)
2 pm.  Message received that patient is requesting a Palliative Care visit for today and would like to be admitted to hospice tomorrow.  Return call made to patient.  She advised she has received a bill for $30000 for her radiation treatment and is unable to pay this.  Patient states this is her reasoning for wanting hospice.  I asked if this was an EOB or a true bill but patient was unable to specify.  I encouraged her to contact her son who is currently managing her finances and he would likely be able to provide more information on this.  Patient hung up the phone.  I contacted son [name] to advise of above.  He is aware of situation and reports patient often becomes fixated on money and cost.  She does not understand EOB's  and coverages with her insurance.  Son shares long history of mental illness and he is currently working with neurology due to patient's dementia.  He is also considering guardianship.  Palliative Care will follow up next month as previously scheduled.

## 2022-03-18 NOTE — Telephone Encounter (Signed)
Received notification from [facility] that patient/family had requested hospice services with Dr. Gilford Rile to be hospice attending.

## 2022-03-19 ENCOUNTER — Ambulatory Visit
Admission: RE | Admit: 2022-03-19 | Discharge: 2022-03-19 | Disposition: A | Discharge status: Home / Self Care | Payer: Medicare HMO | Source: Ambulatory Visit | Attending: Radiation Oncology | Admitting: Radiation Oncology

## 2022-03-19 ENCOUNTER — Inpatient Hospital Stay: Payer: Medicare HMO

## 2022-03-19 ENCOUNTER — Other Ambulatory Visit: Payer: Self-pay

## 2022-03-19 DIAGNOSIS — C50811 Malignant neoplasm of overlapping sites of right female breast: Secondary | ICD-10-CM

## 2022-03-19 LAB — RAD ONC ARIA SESSION SUMMARY
Course Elapsed Days: 21
Plan Fractions Treated to Date: 16
Plan Prescribed Dose Per Fraction: 1.8 Gy
Plan Total Fractions Prescribed: 28
Plan Total Prescribed Dose: 50.4 Gy
Reference Point Dosage Given to Date: 28.8 Gy
Reference Point Session Dosage Given: 1.8 Gy
Session Number: 16

## 2022-03-19 LAB — CBC
HCT: 32.7 % — ABNORMAL LOW (ref 36.0–46.0)
Hemoglobin: 10.5 g/dL — ABNORMAL LOW (ref 12.0–15.0)
MCH: 30.3 pg (ref 26.0–34.0)
MCHC: 32.1 g/dL (ref 30.0–36.0)
MCV: 94.5 fL (ref 80.0–100.0)
Platelets: 334 10*3/uL (ref 150–400)
RBC: 3.46 MIL/uL — ABNORMAL LOW (ref 3.87–5.11)
RDW: 13.1 % (ref 11.5–15.5)
WBC: 5.9 10*3/uL (ref 4.0–10.5)
nRBC: 0 % (ref 0.0–0.2)

## 2022-03-20 ENCOUNTER — Ambulatory Visit
Admission: RE | Admit: 2022-03-20 | Discharge: 2022-03-20 | Disposition: A | Discharge status: Home / Self Care | Payer: Medicare HMO | Source: Ambulatory Visit | Attending: Radiation Oncology | Admitting: Radiation Oncology

## 2022-03-20 ENCOUNTER — Inpatient Hospital Stay: Payer: Medicare HMO

## 2022-03-20 ENCOUNTER — Other Ambulatory Visit: Payer: Self-pay

## 2022-03-20 DIAGNOSIS — C50811 Malignant neoplasm of overlapping sites of right female breast: Secondary | ICD-10-CM | POA: Diagnosis not present

## 2022-03-20 LAB — RAD ONC ARIA SESSION SUMMARY
Course Elapsed Days: 22
Plan Fractions Treated to Date: 17
Plan Prescribed Dose Per Fraction: 1.8 Gy
Plan Total Fractions Prescribed: 28
Plan Total Prescribed Dose: 50.4 Gy
Reference Point Dosage Given to Date: 30.6 Gy
Reference Point Session Dosage Given: 1.8 Gy
Session Number: 17

## 2022-03-21 ENCOUNTER — Ambulatory Visit
Admission: RE | Admit: 2022-03-21 | Discharge: 2022-03-21 | Disposition: A | Discharge status: Home / Self Care | Payer: Medicare HMO | Source: Ambulatory Visit | Attending: Radiation Oncology | Admitting: Radiation Oncology

## 2022-03-21 ENCOUNTER — Other Ambulatory Visit: Payer: Self-pay

## 2022-03-21 DIAGNOSIS — C50811 Malignant neoplasm of overlapping sites of right female breast: Secondary | ICD-10-CM | POA: Diagnosis not present

## 2022-03-21 LAB — RAD ONC ARIA SESSION SUMMARY
Course Elapsed Days: 23
Plan Fractions Treated to Date: 18
Plan Prescribed Dose Per Fraction: 1.8 Gy
Plan Total Fractions Prescribed: 28
Plan Total Prescribed Dose: 50.4 Gy
Reference Point Dosage Given to Date: 32.4 Gy
Reference Point Session Dosage Given: 1.8 Gy
Session Number: 18

## 2022-03-22 ENCOUNTER — Other Ambulatory Visit: Payer: Self-pay

## 2022-03-22 ENCOUNTER — Encounter: Payer: Self-pay | Admitting: *Deleted

## 2022-03-22 ENCOUNTER — Inpatient Hospital Stay: Payer: Medicare HMO | Attending: Oncology

## 2022-03-22 ENCOUNTER — Ambulatory Visit
Admission: RE | Admit: 2022-03-22 | Discharge: 2022-03-22 | Disposition: A | Discharge status: Home / Self Care | Payer: Medicare HMO | Source: Ambulatory Visit | Attending: Radiation Oncology | Admitting: Radiation Oncology

## 2022-03-22 DIAGNOSIS — Z170 Estrogen receptor positive status [ER+]: Secondary | ICD-10-CM | POA: Diagnosis not present

## 2022-03-22 DIAGNOSIS — C50411 Malignant neoplasm of upper-outer quadrant of right female breast: Secondary | ICD-10-CM | POA: Diagnosis present

## 2022-03-22 DIAGNOSIS — C50811 Malignant neoplasm of overlapping sites of right female breast: Secondary | ICD-10-CM | POA: Insufficient documentation

## 2022-03-22 DIAGNOSIS — Z510 Encounter for antineoplastic radiation therapy: Secondary | ICD-10-CM | POA: Diagnosis not present

## 2022-03-22 LAB — RAD ONC ARIA SESSION SUMMARY
Course Elapsed Days: 24
Plan Fractions Treated to Date: 19
Plan Prescribed Dose Per Fraction: 1.8 Gy
Plan Total Fractions Prescribed: 28
Plan Total Prescribed Dose: 50.4 Gy
Reference Point Dosage Given to Date: 34.2 Gy
Reference Point Session Dosage Given: 1.8 Gy
Session Number: 19

## 2022-03-25 ENCOUNTER — Other Ambulatory Visit: Payer: Self-pay | Admitting: *Deleted

## 2022-03-25 ENCOUNTER — Ambulatory Visit
Admission: RE | Admit: 2022-03-25 | Discharge: 2022-03-25 | Disposition: A | Discharge status: Home / Self Care | Payer: Medicare HMO | Source: Ambulatory Visit | Attending: Radiation Oncology | Admitting: Radiation Oncology

## 2022-03-25 ENCOUNTER — Other Ambulatory Visit: Payer: Self-pay

## 2022-03-25 DIAGNOSIS — Z510 Encounter for antineoplastic radiation therapy: Secondary | ICD-10-CM | POA: Diagnosis not present

## 2022-03-25 LAB — RAD ONC ARIA SESSION SUMMARY
Course Elapsed Days: 27
Plan Fractions Treated to Date: 20
Plan Prescribed Dose Per Fraction: 1.8 Gy
Plan Total Fractions Prescribed: 28
Plan Total Prescribed Dose: 50.4 Gy
Reference Point Dosage Given to Date: 36 Gy
Reference Point Session Dosage Given: 1.8 Gy
Session Number: 20

## 2022-03-25 MED ORDER — SILVER SULFADIAZINE 1 % EX CREA: 1.0000 | TOPICAL_CREAM | Freq: Two times a day (BID) | CUTANEOUS | 0 refills | Status: DC

## 2022-03-26 ENCOUNTER — Ambulatory Visit: Admission: RE | Admit: 2022-03-26 | Payer: Medicare HMO | Source: Ambulatory Visit

## 2022-03-26 ENCOUNTER — Ambulatory Visit: Payer: Medicare HMO

## 2022-03-26 ENCOUNTER — Telehealth: Payer: Self-pay

## 2022-03-26 ENCOUNTER — Telehealth: Payer: Self-pay | Admitting: *Deleted

## 2022-03-26 NOTE — Telephone Encounter (Signed)
Returned call to patient's son and answered questions regarding break in treatment and change in appointments. He verbalized understanding.

## 2022-03-27 ENCOUNTER — Inpatient Hospital Stay: Payer: Medicare HMO

## 2022-03-27 ENCOUNTER — Ambulatory Visit: Payer: Medicare HMO

## 2022-03-28 ENCOUNTER — Other Ambulatory Visit: Payer: Medicare HMO

## 2022-03-28 ENCOUNTER — Ambulatory Visit: Payer: Medicare HMO

## 2022-03-28 VITALS — BP 110/72 | HR 85 | Temp 97.4°F

## 2022-03-28 DIAGNOSIS — Z515 Encounter for palliative care: Secondary | ICD-10-CM

## 2022-03-28 NOTE — Progress Notes (Signed)
COMMUNITY PALLIATIVE CARE SW NOTE  PATIENT NAME: NORHAN TANZILLO DOB: Apr 04, 1950 MRN: [id number]  PRIMARY CARE PROVIDER: Sharyne Peach, MD  RESPONSIBLE PARTY:  Acct ID - Guarantor Home Phone Work Phone Relationship Acct Type  [id number] BRONISLAWA, [phone]  Self P/F     [address], [place], [place] [address]     PLAN OF CARE and INTERVENTIONS:              Encounter: Follow up palliative care visit completed with [facility] RN, Almyra Free, and patient. Patients son, [name], called via telephone during visit.  Hospice: patient called into hospice referral center on 2/26 requesting hospice services however after speaking with patients son, hospice services were put on hold. As patient was anxious about a bill that she re received.   Cancer: patients radiation was put on hold. For a week. Patient to follow up again next week and have a CT scan to assess the progress of radiation.   Support: SW provided emotional support, used active and reflective listening in the form of reciprocity emotional response. Patient continues to ruminate on her body appearance, weight and finances in regard to her medical bills. SW encouraged patient to participate in virtual psych support in between [facility] visits. Patient share that her weight was 112-115lbs and she is now 106-107lbs. This weight loss seems to really bother patient along with the scarring from radiation. Patient share that she doesn't eat, but then stated that she does eat. Patient could benefit from ongoing counseling to assist in addressing her ruminating thought pattern, anxiety and depression.   In home support resources: Son is working in Clinical cytogeneticist so that patient can receive CAP-[facility] services. Patient continue to private caregiver in place through aid &attendance. Patient continues to [facility].    Palliative to continue to follow and provide support as needed.  SOCIAL HX:  Social History   Tobacco Use    Smoking status: Former    Packs/day: 0.00    Years: 0.00    Total pack years: 0.00    Types: Cigarettes    Quit date: 11/03/1973    Years since quitting: 48.4    Passive exposure: Past   Smokeless tobacco: Never   Tobacco comments:    Smoked briefly as a teen- lives in secondhand smoke.   Substance Use Topics   Alcohol use: No    Alcohol/week: 0.0 standard drinks of alcohol    CODE STATUS: DNR ADVANCED DIRECTIVES: N MOST FORM COMPLETE: N HOSPICE EDUCATION PROVIDED: Y  PPS: patient is (I) with ADL's.      Doreene Eland, [place]

## 2022-03-28 NOTE — Progress Notes (Signed)
PATIENT NAME: MARAGRET BOHLKEN DOB: Nov 08, 1950 MRN: [id number]  PRIMARY CARE PROVIDER: Sharyne Peach, MD  RESPONSIBLE PARTY:  Acct ID - Guarantor Home Phone Work Phone Relationship Acct Type  [id number] CHYANNA, [phone]  Self P/F     [address], [place], [place] [address]   Home visit completed with patient, [place], SW and by phone with son [name].   Active Listening:  Patient continues to focus on body imagine and finances.  Goes back and forth between continuing treatment.  Patient continues to go to treatment appointments at her own will.  Active listening provided and support offered.   Appetite:  Remains unchanged.  Weight is 107-108 lbs.  Patient focused on her weight throughout the visit.  Son shares deformities to patient's stomach and surgery several years ago.  Patient does have more frequent bowel movements.  She is eating well.   Edema:  Lymph edema present to the right arm.  Patient advised she is not using her compression machine or stocking often. Son is working to obtain another soft compression to wear at night.   Radiation Treatment:  Currently on hold.  Patient shared right breast is draining, burning from radiation.  Discoloration noted to right chest.  Patient will be reassessed on Monday for continuation of treatment.  Will follow up in 1 month at patient/son request to continue to offer support to patient.    CODE STATUS: DNR ADVANCED DIRECTIVES: Yes MOST FORM: No PPS: 50%   PHYSICAL EXAM:   VITALS: Today's Vitals   03/28/22 0942  BP: 110/72  Pulse: 85  Temp: (!) 97.4 F (36.3 C)  SpO2: 94%    LUNGS: clear to auscultation  CARDIAC: Cor RRR}  EXTREMITIES: lymph edema present to right arm.  Right lower extremity SKIN: Skin color, texture, turgor normal. No rashes or lesions or right chest with drainage present and discoloration.   NEURO: positive for gait problems and memory problems       Lorenza Burton, RN

## 2022-03-29 ENCOUNTER — Inpatient Hospital Stay: Payer: Medicare HMO

## 2022-03-29 ENCOUNTER — Ambulatory Visit: Payer: Medicare HMO

## 2022-04-01 ENCOUNTER — Ambulatory Visit
Admission: RE | Admit: 2022-04-01 | Discharge: 2022-04-01 | Disposition: A | Discharge status: Home / Self Care | Payer: Medicare HMO | Source: Ambulatory Visit | Attending: Radiation Oncology | Admitting: Radiation Oncology

## 2022-04-01 ENCOUNTER — Other Ambulatory Visit: Payer: Self-pay

## 2022-04-01 DIAGNOSIS — Z510 Encounter for antineoplastic radiation therapy: Secondary | ICD-10-CM | POA: Diagnosis not present

## 2022-04-01 LAB — RAD ONC ARIA SESSION SUMMARY
Course Elapsed Days: 34
Plan Fractions Treated to Date: 21
Plan Prescribed Dose Per Fraction: 1.8 Gy
Plan Total Fractions Prescribed: 28
Plan Total Prescribed Dose: 50.4 Gy
Reference Point Dosage Given to Date: 37.8 Gy
Reference Point Session Dosage Given: 1.8 Gy
Session Number: 21

## 2022-04-02 ENCOUNTER — Ambulatory Visit
Admission: RE | Admit: 2022-04-02 | Discharge: 2022-04-02 | Disposition: A | Discharge status: Home / Self Care | Payer: Medicare HMO | Source: Ambulatory Visit | Attending: Radiation Oncology | Admitting: Radiation Oncology

## 2022-04-02 ENCOUNTER — Other Ambulatory Visit: Payer: Self-pay

## 2022-04-02 ENCOUNTER — Inpatient Hospital Stay: Payer: Medicare HMO

## 2022-04-02 DIAGNOSIS — Z510 Encounter for antineoplastic radiation therapy: Secondary | ICD-10-CM | POA: Diagnosis not present

## 2022-04-02 LAB — RAD ONC ARIA SESSION SUMMARY
Course Elapsed Days: 35
Plan Fractions Treated to Date: 22
Plan Prescribed Dose Per Fraction: 1.8 Gy
Plan Total Fractions Prescribed: 28
Plan Total Prescribed Dose: 50.4 Gy
Reference Point Dosage Given to Date: 39.6 Gy
Reference Point Session Dosage Given: 1.8 Gy
Session Number: 22

## 2022-04-03 ENCOUNTER — Inpatient Hospital Stay: Payer: Medicare HMO

## 2022-04-03 ENCOUNTER — Other Ambulatory Visit: Payer: Self-pay

## 2022-04-03 ENCOUNTER — Ambulatory Visit
Admission: RE | Admit: 2022-04-03 | Discharge: 2022-04-03 | Disposition: A | Discharge status: Home / Self Care | Payer: Medicare HMO | Source: Ambulatory Visit | Attending: Radiation Oncology | Admitting: Radiation Oncology

## 2022-04-03 DIAGNOSIS — Z510 Encounter for antineoplastic radiation therapy: Secondary | ICD-10-CM | POA: Diagnosis not present

## 2022-04-03 LAB — RAD ONC ARIA SESSION SUMMARY
Course Elapsed Days: 36
Plan Fractions Treated to Date: 23
Plan Prescribed Dose Per Fraction: 1.8 Gy
Plan Total Fractions Prescribed: 28
Plan Total Prescribed Dose: 50.4 Gy
Reference Point Dosage Given to Date: 41.4 Gy
Reference Point Session Dosage Given: 1.8 Gy
Session Number: 23

## 2022-04-04 ENCOUNTER — Other Ambulatory Visit: Payer: Self-pay

## 2022-04-04 ENCOUNTER — Ambulatory Visit: Payer: Medicare HMO

## 2022-04-04 ENCOUNTER — Ambulatory Visit
Admission: RE | Admit: 2022-04-04 | Discharge: 2022-04-04 | Disposition: A | Discharge status: Home / Self Care | Payer: Medicare HMO | Source: Ambulatory Visit | Attending: Radiation Oncology | Admitting: Radiation Oncology

## 2022-04-04 DIAGNOSIS — Z510 Encounter for antineoplastic radiation therapy: Secondary | ICD-10-CM | POA: Diagnosis not present

## 2022-04-04 LAB — RAD ONC ARIA SESSION SUMMARY
Course Elapsed Days: 37
Plan Fractions Treated to Date: 24
Plan Prescribed Dose Per Fraction: 1.8 Gy
Plan Total Fractions Prescribed: 28
Plan Total Prescribed Dose: 50.4 Gy
Reference Point Dosage Given to Date: 43.2 Gy
Reference Point Session Dosage Given: 1.8 Gy
Session Number: 24

## 2022-04-05 ENCOUNTER — Encounter: Payer: Self-pay | Admitting: *Deleted

## 2022-04-05 ENCOUNTER — Other Ambulatory Visit: Payer: Self-pay

## 2022-04-05 ENCOUNTER — Ambulatory Visit
Admission: RE | Admit: 2022-04-05 | Discharge: 2022-04-05 | Disposition: A | Discharge status: Home / Self Care | Payer: Medicare HMO | Source: Ambulatory Visit | Attending: Radiation Oncology | Admitting: Radiation Oncology

## 2022-04-05 ENCOUNTER — Inpatient Hospital Stay: Payer: Medicare HMO

## 2022-04-05 DIAGNOSIS — Z510 Encounter for antineoplastic radiation therapy: Secondary | ICD-10-CM | POA: Diagnosis not present

## 2022-04-05 LAB — RAD ONC ARIA SESSION SUMMARY
Course Elapsed Days: 38
Plan Fractions Treated to Date: 25
Plan Prescribed Dose Per Fraction: 1.8 Gy
Plan Total Fractions Prescribed: 28
Plan Total Prescribed Dose: 50.4 Gy
Reference Point Dosage Given to Date: 45 Gy
Reference Point Session Dosage Given: 1.8 Gy
Session Number: 25

## 2022-04-08 ENCOUNTER — Other Ambulatory Visit: Payer: Self-pay

## 2022-04-08 ENCOUNTER — Ambulatory Visit
Admission: RE | Admit: 2022-04-08 | Discharge: 2022-04-08 | Disposition: A | Discharge status: Home / Self Care | Payer: Medicare HMO | Source: Ambulatory Visit | Attending: Radiation Oncology | Admitting: Radiation Oncology

## 2022-04-08 DIAGNOSIS — Z510 Encounter for antineoplastic radiation therapy: Secondary | ICD-10-CM | POA: Diagnosis not present

## 2022-04-08 LAB — RAD ONC ARIA SESSION SUMMARY
Course Elapsed Days: 41
Plan Fractions Treated to Date: 26
Plan Prescribed Dose Per Fraction: 1.8 Gy
Plan Total Fractions Prescribed: 28
Plan Total Prescribed Dose: 50.4 Gy
Reference Point Dosage Given to Date: 46.8 Gy
Reference Point Session Dosage Given: 1.8 Gy
Session Number: 26

## 2022-04-09 ENCOUNTER — Ambulatory Visit
Admission: RE | Admit: 2022-04-09 | Discharge: 2022-04-09 | Disposition: A | Discharge status: Home / Self Care | Payer: Medicare HMO | Source: Ambulatory Visit | Attending: Radiation Oncology | Admitting: Radiation Oncology

## 2022-04-09 ENCOUNTER — Other Ambulatory Visit: Payer: Self-pay

## 2022-04-09 DIAGNOSIS — Z510 Encounter for antineoplastic radiation therapy: Secondary | ICD-10-CM | POA: Diagnosis not present

## 2022-04-09 LAB — RAD ONC ARIA SESSION SUMMARY
Course Elapsed Days: 42
Plan Fractions Treated to Date: 27
Plan Prescribed Dose Per Fraction: 1.8 Gy
Plan Total Fractions Prescribed: 28
Plan Total Prescribed Dose: 50.4 Gy
Reference Point Dosage Given to Date: 48.6 Gy
Reference Point Session Dosage Given: 1.8 Gy
Session Number: 27

## 2022-04-10 ENCOUNTER — Ambulatory Visit: Admission: RE | Admit: 2022-04-10 | Payer: Medicare HMO | Source: Ambulatory Visit

## 2022-04-10 ENCOUNTER — Other Ambulatory Visit: Payer: Self-pay

## 2022-04-10 ENCOUNTER — Inpatient Hospital Stay: Payer: Medicare HMO

## 2022-04-10 ENCOUNTER — Ambulatory Visit
Admission: RE | Admit: 2022-04-10 | Discharge: 2022-04-10 | Disposition: A | Discharge status: Home / Self Care | Payer: Medicare HMO | Source: Ambulatory Visit | Attending: Radiation Oncology | Admitting: Radiation Oncology

## 2022-04-10 ENCOUNTER — Encounter: Payer: Self-pay | Admitting: *Deleted

## 2022-04-10 DIAGNOSIS — Z510 Encounter for antineoplastic radiation therapy: Secondary | ICD-10-CM | POA: Diagnosis not present

## 2022-04-10 LAB — RAD ONC ARIA SESSION SUMMARY
Course Elapsed Days: 43
Plan Fractions Treated to Date: 28
Plan Prescribed Dose Per Fraction: 1.8 Gy
Plan Total Fractions Prescribed: 28
Plan Total Prescribed Dose: 50.4 Gy
Reference Point Dosage Given to Date: 50.4 Gy
Reference Point Session Dosage Given: 1.8 Gy
Session Number: 28

## 2022-04-11 ENCOUNTER — Ambulatory Visit
Admission: RE | Admit: 2022-04-11 | Discharge: 2022-04-11 | Disposition: A | Discharge status: Home / Self Care | Payer: Medicare HMO | Source: Ambulatory Visit | Attending: Radiation Oncology | Admitting: Radiation Oncology

## 2022-04-11 ENCOUNTER — Other Ambulatory Visit: Payer: Self-pay

## 2022-04-11 DIAGNOSIS — Z510 Encounter for antineoplastic radiation therapy: Secondary | ICD-10-CM | POA: Diagnosis not present

## 2022-04-11 LAB — RAD ONC ARIA SESSION SUMMARY
Course Elapsed Days: 44
Plan Fractions Treated to Date: 1
Plan Fractions Treated to Date: 1
Plan Prescribed Dose Per Fraction: 2 Gy
Plan Prescribed Dose Per Fraction: 2 Gy
Plan Total Fractions Prescribed: 10
Plan Total Fractions Prescribed: 10
Plan Total Prescribed Dose: 20 Gy
Plan Total Prescribed Dose: 20 Gy
Reference Point Dosage Given to Date: 2 Gy
Reference Point Dosage Given to Date: 2 Gy
Reference Point Session Dosage Given: 2 Gy
Reference Point Session Dosage Given: 2 Gy
Session Number: 29

## 2022-04-12 ENCOUNTER — Other Ambulatory Visit: Payer: Self-pay

## 2022-04-12 ENCOUNTER — Inpatient Hospital Stay: Payer: Medicare HMO

## 2022-04-12 ENCOUNTER — Ambulatory Visit
Admission: RE | Admit: 2022-04-12 | Discharge: 2022-04-12 | Disposition: A | Discharge status: Home / Self Care | Payer: Medicare HMO | Source: Ambulatory Visit | Attending: Radiation Oncology | Admitting: Radiation Oncology

## 2022-04-12 ENCOUNTER — Encounter: Payer: Self-pay | Admitting: *Deleted

## 2022-04-12 DIAGNOSIS — Z510 Encounter for antineoplastic radiation therapy: Secondary | ICD-10-CM | POA: Diagnosis not present

## 2022-04-12 LAB — RAD ONC ARIA SESSION SUMMARY
Course Elapsed Days: 45
Plan Fractions Treated to Date: 2
Plan Fractions Treated to Date: 2
Plan Prescribed Dose Per Fraction: 2 Gy
Plan Prescribed Dose Per Fraction: 2 Gy
Plan Total Fractions Prescribed: 10
Plan Total Fractions Prescribed: 10
Plan Total Prescribed Dose: 20 Gy
Plan Total Prescribed Dose: 20 Gy
Reference Point Dosage Given to Date: 4 Gy
Reference Point Dosage Given to Date: 4 Gy
Reference Point Session Dosage Given: 2 Gy
Reference Point Session Dosage Given: 2 Gy
Session Number: 30

## 2022-04-15 ENCOUNTER — Ambulatory Visit
Admission: RE | Admit: 2022-04-15 | Discharge: 2022-04-15 | Disposition: A | Discharge status: Home / Self Care | Payer: Medicare HMO | Source: Ambulatory Visit | Attending: Family Medicine | Admitting: Family Medicine

## 2022-04-15 ENCOUNTER — Ambulatory Visit
Admission: RE | Admit: 2022-04-15 | Discharge: 2022-04-15 | Disposition: A | Discharge status: Home / Self Care | Payer: Medicare HMO | Source: Ambulatory Visit | Attending: Radiation Oncology | Admitting: Radiation Oncology

## 2022-04-15 ENCOUNTER — Other Ambulatory Visit: Payer: Self-pay

## 2022-04-15 DIAGNOSIS — I517 Cardiomegaly: Secondary | ICD-10-CM | POA: Insufficient documentation

## 2022-04-15 DIAGNOSIS — Z510 Encounter for antineoplastic radiation therapy: Secondary | ICD-10-CM | POA: Diagnosis not present

## 2022-04-15 DIAGNOSIS — R9431 Abnormal electrocardiogram [ECG] [EKG]: Secondary | ICD-10-CM | POA: Diagnosis not present

## 2022-04-15 DIAGNOSIS — Z01810 Encounter for preprocedural cardiovascular examination: Secondary | ICD-10-CM

## 2022-04-15 LAB — RAD ONC ARIA SESSION SUMMARY
Course Elapsed Days: 48
Plan Fractions Treated to Date: 3
Plan Fractions Treated to Date: 3
Plan Prescribed Dose Per Fraction: 2 Gy
Plan Prescribed Dose Per Fraction: 2 Gy
Plan Total Fractions Prescribed: 10
Plan Total Fractions Prescribed: 10
Plan Total Prescribed Dose: 20 Gy
Plan Total Prescribed Dose: 20 Gy
Reference Point Dosage Given to Date: 6 Gy
Reference Point Dosage Given to Date: 6 Gy
Reference Point Session Dosage Given: 2 Gy
Reference Point Session Dosage Given: 2 Gy
Session Number: 31

## 2022-04-16 ENCOUNTER — Ambulatory Visit
Admission: RE | Admit: 2022-04-16 | Discharge: 2022-04-16 | Disposition: A | Discharge status: Home / Self Care | Payer: Medicare HMO | Source: Ambulatory Visit | Attending: Radiation Oncology | Admitting: Radiation Oncology

## 2022-04-16 ENCOUNTER — Inpatient Hospital Stay: Payer: Medicare HMO

## 2022-04-16 ENCOUNTER — Other Ambulatory Visit: Payer: Self-pay

## 2022-04-16 DIAGNOSIS — Z510 Encounter for antineoplastic radiation therapy: Secondary | ICD-10-CM | POA: Diagnosis not present

## 2022-04-16 LAB — RAD ONC ARIA SESSION SUMMARY
Course Elapsed Days: 49
Plan Fractions Treated to Date: 4
Plan Fractions Treated to Date: 4
Plan Prescribed Dose Per Fraction: 2 Gy
Plan Prescribed Dose Per Fraction: 2 Gy
Plan Total Fractions Prescribed: 10
Plan Total Fractions Prescribed: 10
Plan Total Prescribed Dose: 20 Gy
Plan Total Prescribed Dose: 20 Gy
Reference Point Dosage Given to Date: 8 Gy
Reference Point Dosage Given to Date: 8 Gy
Reference Point Session Dosage Given: 2 Gy
Reference Point Session Dosage Given: 2 Gy
Session Number: 32

## 2022-04-17 ENCOUNTER — Ambulatory Visit
Admission: RE | Admit: 2022-04-17 | Discharge: 2022-04-17 | Disposition: A | Discharge status: Home / Self Care | Payer: Medicare HMO | Source: Ambulatory Visit | Attending: Radiation Oncology | Admitting: Radiation Oncology

## 2022-04-17 ENCOUNTER — Inpatient Hospital Stay: Payer: Medicare HMO

## 2022-04-17 ENCOUNTER — Other Ambulatory Visit: Payer: Self-pay

## 2022-04-17 DIAGNOSIS — Z510 Encounter for antineoplastic radiation therapy: Secondary | ICD-10-CM | POA: Diagnosis not present

## 2022-04-17 LAB — RAD ONC ARIA SESSION SUMMARY
Course Elapsed Days: 50
Plan Fractions Treated to Date: 5
Plan Fractions Treated to Date: 5
Plan Prescribed Dose Per Fraction: 2 Gy
Plan Prescribed Dose Per Fraction: 2 Gy
Plan Total Fractions Prescribed: 10
Plan Total Fractions Prescribed: 10
Plan Total Prescribed Dose: 20 Gy
Plan Total Prescribed Dose: 20 Gy
Reference Point Dosage Given to Date: 10 Gy
Reference Point Dosage Given to Date: 10 Gy
Reference Point Session Dosage Given: 2 Gy
Reference Point Session Dosage Given: 2 Gy
Session Number: 33

## 2022-04-18 ENCOUNTER — Other Ambulatory Visit: Payer: Self-pay

## 2022-04-18 ENCOUNTER — Ambulatory Visit
Admission: RE | Admit: 2022-04-18 | Discharge: 2022-04-18 | Disposition: A | Discharge status: Home / Self Care | Payer: Medicare HMO | Source: Ambulatory Visit | Attending: Radiation Oncology | Admitting: Radiation Oncology

## 2022-04-18 DIAGNOSIS — Z510 Encounter for antineoplastic radiation therapy: Secondary | ICD-10-CM | POA: Diagnosis not present

## 2022-04-18 LAB — RAD ONC ARIA SESSION SUMMARY
Course Elapsed Days: 51
Plan Fractions Treated to Date: 6
Plan Fractions Treated to Date: 6
Plan Prescribed Dose Per Fraction: 2 Gy
Plan Prescribed Dose Per Fraction: 2 Gy
Plan Total Fractions Prescribed: 10
Plan Total Fractions Prescribed: 10
Plan Total Prescribed Dose: 20 Gy
Plan Total Prescribed Dose: 20 Gy
Reference Point Dosage Given to Date: 12 Gy
Reference Point Dosage Given to Date: 12 Gy
Reference Point Session Dosage Given: 2 Gy
Reference Point Session Dosage Given: 2 Gy
Session Number: 34

## 2022-04-19 ENCOUNTER — Ambulatory Visit
Admission: RE | Admit: 2022-04-19 | Discharge: 2022-04-19 | Disposition: A | Discharge status: Home / Self Care | Payer: Medicare HMO | Source: Ambulatory Visit | Attending: Radiation Oncology | Admitting: Radiation Oncology

## 2022-04-19 ENCOUNTER — Inpatient Hospital Stay: Payer: Medicare HMO

## 2022-04-19 ENCOUNTER — Encounter: Payer: Self-pay | Admitting: *Deleted

## 2022-04-19 ENCOUNTER — Other Ambulatory Visit: Payer: Self-pay

## 2022-04-19 DIAGNOSIS — Z510 Encounter for antineoplastic radiation therapy: Secondary | ICD-10-CM | POA: Diagnosis not present

## 2022-04-19 LAB — RAD ONC ARIA SESSION SUMMARY
Course Elapsed Days: 52
Plan Fractions Treated to Date: 7
Plan Fractions Treated to Date: 7
Plan Prescribed Dose Per Fraction: 2 Gy
Plan Prescribed Dose Per Fraction: 2 Gy
Plan Total Fractions Prescribed: 10
Plan Total Fractions Prescribed: 10
Plan Total Prescribed Dose: 20 Gy
Plan Total Prescribed Dose: 20 Gy
Reference Point Dosage Given to Date: 14 Gy
Reference Point Dosage Given to Date: 14 Gy
Reference Point Session Dosage Given: 2 Gy
Reference Point Session Dosage Given: 2 Gy
Session Number: 35

## 2022-04-19 NOTE — Progress Notes (Unsigned)
BH MD/PA/NP OP Progress Note  04/22/2022 5:31 PM Elizabeth [name]  MRN:  [id number]  Chief Complaint:  Chief Complaint  Patient presents with   Follow-up   HPI:  This is a follow-up appointment for bipolar disorder and insomnia.  She states that it has been good and bad.  She does not have a desire to leave, although she did denies any plan or intent.  She is getting radiation.  She is certain about edema in her arm, and burning her skin.  She feels like a man.  When she is asked about antichrist, she states that it is still the same.  She spends time cleaning the floor.  She eats sandwiches at times.  Although she has caregivers, she does not like this person.  She does not like the day program either as she wants to be by herself.  She states that it is the end of the ward, citing the day program. She feels that she is ugly, "antichrist." The patient has mood symptoms as in PHQ-9/GAD-7. She denies AH, VH.   Elizabeth [name], her son presents to the visit.  He states that she does not have any caregiver from Friday through Sunday due to making sure about the transportation to get radiation during the weekday.  However, he thinks she is not as "manic" since uptitration of sertraline/on days she takes medication.  She is more realistic with things, and what is going on in her life.  She is not talking about negative things, and not paranoid.  They are in the process of applying for Medicaid so that she can have additional in home services.  Although he reports a preference to switch to injection, he verbalized understanding to stay on the current medication as she is not interested in trying Abilify. She is seen at memory clinic, and will get PET scan, and labs for alzheimer.   Support: son Household: by herself Marital status: widow. Her husband died form COPD in 05/20/05 Number of children: 1(2 children) Employment: works at [employer],  Education:  tenth grade (to work at [employer], was under drugs) Last  PCP / ongoing medical evaluation:  Wt Readings from Last 3 Encounters:  04/22/22 108 lb 3.2 oz (49.1 kg)  02/25/22 109 lb (49.4 kg)  02/11/22 112 lb 3.2 oz (50.9 kg)     Visit Diagnosis:    ICD-10-CM   1. Bipolar affective disorder, currently depressed, moderate  F31.32     2. Insomnia, unspecified type  G47.00       Past Psychiatric History: Please see initial evaluation for full details. I have reviewed the history. No updates at this time.     Past Medical History:  Past Medical History:  Diagnosis Date   Acute cholecystitis 2014/05/21   Anemia    Anemia 2014/05/21   Anxiety    panic attacks   Anxiety and depression    Bipolar 1 disorder    Bowel obstruction May 21, 2015   Common bile duct dilatation 05/20/2013   Dyspnea    Edema    Fibromyalgia    GI bleed 21-May-2015   History of kidney stones 01/21/2009   HSV-1 infection    Hyperglycemia    Hyperlipidemia    Hypokalemia    Hypothyroidism    Liver cyst    Malignant neoplasm of right female breast, unspecified estrogen receptor status, unspecified site of breast    Memory loss    Osteopenia    Panic disorder    Schizophrenia  Ventral hernia     Past Surgical History:  Procedure Laterality Date   ABDOMINAL HYSTERECTOMY     BREAST BIOPSY Right 01/19/2021   [place] Bx, ribbon, path pending   BREAST BIOPSY Right 01/19/2021   [place] Bx, Venus, path pending   BREAST BIOPSY Right 01/19/2021   [place] Bx, heart, path pending   BREAST BIOPSY Right 01/19/2021   [place] BX Axilla, Coil, path pending   CESAREAN SECTION     CHOLECYSTECTOMY OPEN  11/17/2013   with repair of bile duct-Dr. Marina Gravel   MASTECTOMY MODIFIED RADICAL Right 03/02/2021   Procedure: MASTECTOMY MODIFIED RADICAL;  Surgeon: Benjamine Sprague, DO;  Location: [facility];  Service: General;  Laterality: Right;   OPEN REDUCTION INTERNAL FIXATION (ORIF) DISTAL RADIAL FRACTURE Right 12/20/2014   Procedure: OPEN REDUCTION INTERNAL FIXATION (ORIF) RIGHT DISTAL RADIAL FRACTURE;  Surgeon: Renette Butters, MD;   Location: [place];  Service: Orthopedics;  Laterality: Right;   STOMACH SURGERY      Family Psychiatric History: Please see initial evaluation for full details. I have reviewed the history. No updates at this time.     Family History:  Family History  Problem Relation Age of Onset   Heart disease Father    Breast cancer Paternal Aunt    Kidney disease Paternal 75    Breast cancer Paternal Grandmother    Asthma Daughter    Bladder Cancer Neg Hx     Social History:  Social History   Socioeconomic History   Marital status: Married    Spouse name: Not on file   Number of children: 1   Years of education: Not on file   Highest education level: Not on file  Occupational History   Not on file  Tobacco Use   Smoking status: Former    Packs/day: 0.00    Years: 0.00    Additional pack years: 0.00    Total pack years: 0.00    Types: Cigarettes    Quit date: 11/03/1973    Years since quitting: 48.4    Passive exposure: Past   Smokeless tobacco: Never   Tobacco comments:    Smoked briefly as a teen- lives in secondhand smoke.   Vaping Use   Vaping Use: Never used  Substance and Sexual Activity   Alcohol use: No    Alcohol/week: 0.0 standard drinks of alcohol   Drug use: No   Sexual activity: Not Currently  Other Topics Concern   Not on file  Social History Narrative   Not on file   Social Determinants of Health   Financial Resource Strain: Not on file  Food Insecurity: Not on file  Transportation Needs: Unmet Transportation Needs (04/19/2022)   PRAPARE - Hydrologist (Medical): Yes    Lack of Transportation (Non-Medical): Yes  Physical Activity: Not on file  Stress: Not on file  Social Connections: Not on file    Allergies:  Allergies  Allergen Reactions   Penicillins Hives    TOLERATED CEFAZOLIN    Metabolic Disorder Labs: No results found for: "HGBA1C", "MPG" No results found for: "PROLACTIN" No results  found for: "CHOL", "TRIG", "HDL", "CHOLHDL", "VLDL", "LDLCALC" No results found for: "TSH"  Therapeutic Level Labs: No results found for: "LITHIUM" Lab Results  Component Value Date   VALPROATE <10 (L) 07/18/2021   No results found for: "CBMZ"  Current Medications: Current Outpatient Medications  Medication Sig Dispense Refill   alendronate (FOSAMAX) 70 MG tablet Take 1  tablet by mouth every Monday.     ferrous sulfate 325 (65 FE) MG tablet Take 1 tablet by mouth daily with breakfast.     letrozole (FEMARA) 2.5 MG tablet Take 1 tablet (2.5 mg total) by mouth daily. 90 tablet 3   levothyroxine (SYNTHROID, LEVOTHROID) 88 MCG tablet Take 88 mcg by mouth daily before breakfast.     silver sulfADIAZINE (SILVADENE) 1 % cream Apply 1 Application topically 2 (two) times daily. 50 g 0   Vitamin D, Ergocalciferol, (DRISDOL) 1.25 MG (50000 UNIT) CAPS capsule Take 50,000 Units by mouth once a week.     [START ON 05/30/2022] QUEtiapine (SEROQUEL) 25 MG tablet Take 1 tablet (25 mg total) by mouth at bedtime. 90 tablet 0   [START ON 05/26/2022] sertraline (ZOLOFT) 50 MG tablet Take 1 tablet (50 mg total) by mouth daily. 90 tablet 0   No current facility-administered medications for this visit.     Musculoskeletal: Strength & Muscle Tone: within normal limits Gait & Station:  uses a walker Patient leans: N/A  Psychiatric Specialty Exam: Review of Systems  Psychiatric/Behavioral:  Positive for decreased concentration, dysphoric mood, sleep disturbance and suicidal ideas. Negative for agitation, behavioral problems, confusion, hallucinations and self-injury. The patient is nervous/anxious. The patient is not hyperactive.   All other systems reviewed and are negative.   Blood pressure 115/72, pulse 94, temperature 97.8 F (36.6 C), temperature source Skin, height 5' (1.524 m), weight 108 lb 3.2 oz (49.1 kg).Body mass index is 21.13 kg/m.  General Appearance: Fairly Groomed  Eye Contact:  Good   Speech:  Clear and Coherent  Volume:  Normal  Mood:  Depressed  Affect:  Appropriate, Congruent, and Restricted  Thought Process:  Disorganized  Orientation:  Full (Time, Place, and Person)  Thought Content: Illogical and Delusions   Suicidal Thoughts:  Yes.  without intent/plan  Homicidal Thoughts:  No  Memory:  Immediate;   Good  Judgement:  Impaired  Insight:  Lacking  Psychomotor Activity:  Normal, Normal tone, no rigidity, no resting/postural tremors, no tardive dyskinesia    Concentration:  Concentration: Good and Attention Span: Good  Recall:  Good  Fund of Knowledge: Good  Language: Good  Akathisia:  No  Handed:  Right  AIMS (if indicated): not done  Assets:  Communication Skills Desire for Improvement  ADL's:  Intact  Cognition: WNL  Sleep:  Poor   Screenings: GAD-7    Physiological scientist Office Visit from 02/25/2022 in [place] Office Visit from 12/31/2021 in [place] Office Visit from 11/05/2021 in [place]  Total GAD-7 Score 19 18 21       PHQ2-9    [place] Office Visit from 04/22/2022 in [place] Office Visit from 02/25/2022 in [place] Office Visit from 12/31/2021 in [place] Office Visit from 11/05/2021 in [place] Office Visit from 08/23/2021 in [place]  PHQ-2 Total Score 6 6 4 2 6   PHQ-9 Total Score 22 18 16 11 20       [place] Office Visit from 04/22/2022 in [place] Office Visit from 02/25/2022 in [place] ED from 01/29/2022 in [facility] Emergency Department at [place] Error: Q3, 4, or 5 should  not be populated when Q2 is  No No Risk No Risk        Assessment and Plan:  GAVRIELLE NEVES is a 72 y.o. 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   1. Bipolar affective disorder, currently depressed, moderate 2. Insomnia, unspecified type R/o MDD with psychotic features  Acute stressors include: undergoing radiation treatment for breast caner s/p mastectomy Other stressors include:    History:  history of bipolar disorder (has this diagnosis according to her son, although she has no known manic symptoms), r/o early manifestation of neuropsychiatric symptoms secondary to underlying neurocognitive disorder.  Unstable with slight improvement. She continues to demonstrate disorganized thought process, rumination on the notion of being "antichrist," preoccupation with somatic symptoms.  Her son reports slight improvement in her symptoms since uptitration.  Will continue sertraline to target depression.  Will continue quetiapine for depression, and rumination/delusion, insomnia.  It is noted that although her son voiced a preference to try injection, the patient is not willing to try Abilify. Will not plan to try risperidone, Haldol, or any other injection due to their risk of EPS. Both the patient and her son agree to continue with quetiapine with the hope that she will be open to exploring other treatment options in the future.  R/o cognitive impairment Functional Status   IADL: Independent in the following: managing finances, medications, driving           Requires assistance with the following:  ADL  Independent in the following: bathing and hygiene, feeding, continence, grooming and toileting, walking          Requires assistance with the following: Folate, Vtamin B12, TSH Images Brain MRI 01/2022   Brain: Diffusion imaging does not show any acute or subacute infarction. No focal abnormality affects the brainstem or cerebellum. Cerebral hemispheres show subjectively the relative absence of volume loss. There are scattered foci of T2 and FLAIR signal within the white matter consistent with mild to low moderate chronic small-vessel ischemic change. No advanced disease. No cortical or large vessel territory infarction. No mass lesion, hemorrhage, hydrocephalus or extra-axial collection. No generalized or lobar specific volume loss. Moderate chronic small-vessel ischemic changes of the cerebral hemispheric white matter. No acute or reversible finding. Neuropsych assessment: she was seen at [facility] clinic 12/2021. Dx r/o late onset alzheimer. MMSE Total Score 26/30 per chart. ([place] 14 in 10/2021) Etiology:  Lewy body dementia, Alzheimer's disease, and/or depression She is currently seen at [facility] clinic, and will have PET scan, and the labs to rule out Alzheimer's disease. Will defer further evaluation at this time.    Plan  Continue sertraline 50 mg daily Continue quetiapine 25 mg at night (QTc HR 73, QTC 427 msec 03/2022) Next appointment: 5/28 at 3:30, IP  Her son agrees to come together - she sees a therapist, Tiffany summers LCSW, weekly at [facility],  - consent form to have 2-way conversation with her son, Elizabeth [name] is on file. -Discussed with her son to bring her to the hospital if any concern of continued decrease in appetite, any danger to self/others.     Addendum (late entry): Communicated with her oncologist, Beckey Rutter, NP on 01/01/2022. There is a recurrence of breast cancer. She understands her current medical condition per provider. She will be seen by Dr. Grayland Ormond and Altha Harm, NP. They may take head CT  scan. Will consider enhance head MRI if it not done at [place] clinic/upcoming appointment.     I have reviewed suicide assessment in detail. No  change in the following assessment.    The patient demonstrates the following risk factors for suicide: Chronic risk factors for suicide include: psychiatric disorder of depression . Acute risk factors for suicide include: unemployment, social withdrawal/isolation, and recent suicide attempt/discharges from inpatient psychiatry. Protective factors for this patient include: positive social support. Considering these factors, the overall suicide risk at this point appears to be moderate, but not at imminent risk. There are no guns in the house. Patient is appropriate for outpatient follow up.     Collaboration of Care: Collaboration of Care: Other reviewed notes in Epic  Patient/Guardian was advised Release of Information must be obtained prior to any record release in order to collaborate their care with an outside provider. Patient/Guardian was advised if they have not already done so to contact the registration department to sign all necessary forms in order for [place] to release information regarding their care.   Consent: Patient/Guardian gives verbal consent for treatment and assignment of benefits for services provided during this visit. Patient/Guardian expressed understanding and agreed to proceed.    Norman Clay, MD 04/22/2022, 5:31 PM

## 2022-04-19 NOTE — Progress Notes (Incomplete)
BH MD/PA/NP OP Progress Note  04/19/2022 2:36 PM Elizabeth [name]  MRN:  [id number]  Chief Complaint: No chief complaint on file.  HPI: *** His son wants xrt ? Palliative care  Visit Diagnosis: No diagnosis found.  Past Psychiatric History: ***  Past Medical History:  Past Medical History:  Diagnosis Date  . Acute cholecystitis 2016  . Anemia   . Anemia 2016  . Anxiety    panic attacks  . Anxiety and depression   . Bipolar 1 disorder ([place])   . Bowel obstruction ([place]) 2017  . Common bile duct dilatation 2015  . Dyspnea   . Edema   . Fibromyalgia   . GI bleed 2017  . History of kidney stones 01/21/2009  . HSV-1 infection   . Hyperglycemia   . Hyperlipidemia   . Hypokalemia   . Hypothyroidism   . Liver cyst   . Malignant neoplasm of right female breast, unspecified estrogen receptor status, unspecified site of breast ([place])   . Memory loss   . Osteopenia   . Panic disorder   . Schizophrenia ([place])   . Ventral hernia     Past Surgical History:  Procedure Laterality Date  . ABDOMINAL HYSTERECTOMY    . BREAST BIOPSY Right 01/19/2021   [place] Bx, ribbon, path pending  . BREAST BIOPSY Right 01/19/2021   [place] Bx, Venus, path pending  . BREAST BIOPSY Right 01/19/2021   [place] Bx, heart, path pending  . BREAST BIOPSY Right 01/19/2021   [place] BX Axilla, Coil, path pending  . CESAREAN SECTION    . CHOLECYSTECTOMY OPEN  11/17/2013   with repair of bile duct-Dr. Marina Gravel  . MASTECTOMY MODIFIED RADICAL Right 03/02/2021   Procedure: MASTECTOMY MODIFIED RADICAL;  Surgeon: Benjamine Sprague, DO;  Location: [facility];  Service: General;  Laterality: Right;  . OPEN REDUCTION INTERNAL FIXATION (ORIF) DISTAL RADIAL FRACTURE Right 12/20/2014   Procedure: OPEN REDUCTION INTERNAL FIXATION (ORIF) RIGHT DISTAL RADIAL FRACTURE;  Surgeon: Renette Butters, MD;  Location: [facility];  Service: Orthopedics;  Laterality: Right;  . STOMACH SURGERY      Family Psychiatric History: ***  Family  History:  Family History  Problem Relation Age of Onset  . Heart disease Father   . Breast cancer Paternal Aunt   . Kidney disease Paternal Aunt   . Breast cancer Paternal Grandmother   . Asthma Daughter   . Bladder Cancer Neg Hx     Social History:  Social History   Socioeconomic History  . Marital status: Married    Spouse name: Not on file  . Number of children: 1  . Years of education: Not on file  . Highest education level: Not on file  Occupational History  . Not on file  Tobacco Use  . Smoking status: Former    Packs/day: 0.00    Years: 0.00    Additional pack years: 0.00    Total pack years: 0.00    Types: Cigarettes    Quit date: 11/03/1973    Years since quitting: 48.4    Passive exposure: Past  . Smokeless tobacco: Never  . Tobacco comments:    Smoked briefly as a teen- lives in secondhand smoke.   Vaping Use  . Vaping Use: Never used  Substance and Sexual Activity  . Alcohol use: No    Alcohol/week: 0.0 standard drinks of alcohol  . Drug use: No  . Sexual activity: Not Currently  Other Topics Concern  . Not on file  Social History Narrative  . Not on file   Social Determinants of Health   Financial Resource Strain: Not on file  Food Insecurity: Not on file  Transportation Needs: Unmet Transportation Needs (04/19/2022)   PRAPARE - Transportation   . Lack of Transportation (Medical): Yes   . Lack of Transportation (Non-Medical): Yes  Physical Activity: Not on file  Stress: Not on file  Social Connections: Not on file    Allergies:  Allergies  Allergen Reactions  . Penicillins Hives    TOLERATED CEFAZOLIN    Metabolic Disorder Labs: No results found for: "HGBA1C", "MPG" No results found for: "PROLACTIN" No results found for: "CHOL", "TRIG", "HDL", "CHOLHDL", "VLDL", "LDLCALC" No results found for: "TSH"  Therapeutic Level Labs: No results found for: "LITHIUM" Lab Results  Component Value Date   VALPROATE <10 (L) 07/18/2021   No  results found for: "CBMZ"  Current Medications: Current Outpatient Medications  Medication Sig Dispense Refill  . alendronate (FOSAMAX) 70 MG tablet Take 1 tablet by mouth every Monday.    . ferrous sulfate 325 (65 FE) MG tablet Take 1 tablet by mouth daily with breakfast.    . letrozole (FEMARA) 2.5 MG tablet Take 1 tablet (2.5 mg total) by mouth daily. 90 tablet 3  . levothyroxine (SYNTHROID, LEVOTHROID) 88 MCG tablet Take 88 mcg by mouth daily before breakfast.    . QUEtiapine (SEROQUEL) 25 MG tablet Take 1 tablet (25 mg total) by mouth at bedtime. 30 tablet 2  . sertraline (ZOLOFT) 50 MG tablet Take 1 tablet (50 mg total) by mouth daily. 30 tablet 2  . silver sulfADIAZINE (SILVADENE) 1 % cream Apply 1 Application topically 2 (two) times daily. 50 g 0  . Vitamin D, Ergocalciferol, (DRISDOL) 1.25 MG (50000 UNIT) CAPS capsule Take 50,000 Units by mouth once a week.     No current facility-administered medications for this visit.     Musculoskeletal: Strength & Muscle Tone: {desc; muscle tone:32375} Gait & Station: {PE GAIT ED EF:6704556 Patient leans: {Patient Leans:21022755}  Psychiatric Specialty Exam: Review of Systems  There were no vitals taken for this visit.There is no height or weight on file to calculate BMI.  General Appearance: {Appearance:22683}  Eye Contact:  {BHH EYE CONTACT:22684}  Speech:  {Speech:22685}  Volume:  {Volume (PAA):22686}  Mood:  {BHH MOOD:22306}  Affect:  {Affect (PAA):22687}  Thought Process:  {Thought Process (PAA):22688}  Orientation:  {BHH ORIENTATION (PAA):22689}  Thought Content: {Thought Content:22690}   Suicidal Thoughts:  {ST/HT (PAA):22692}  Homicidal Thoughts:  {ST/HT (PAA):22692}  Memory:  {BHH MEMORY:22881}  Judgement:  {Judgement (PAA):22694}  Insight:  {Insight (PAA):22695}  Psychomotor Activity:  {Psychomotor (PAA):22696}  Concentration:  {Concentration:21399}  Recall:  {BHH GOOD/FAIR/POOR:22877}  Fund of Knowledge: {BHH  GOOD/FAIR/POOR:22877}  Language: {BHH GOOD/FAIR/POOR:22877}  Akathisia:  {BHH YES OR NO:22294}  Handed:  {Handed:22697}  AIMS (if indicated): {Desc; done/not:10129}  Assets:  {Assets (PAA):22698}  ADL's:  {BHH XO:4411959  Cognition: {chl bhh cognition:304700322}  Sleep:  {BHH GOOD/FAIR/POOR:22877}   Screenings: GAD-7    Physiological scientist Office Visit from 02/25/2022 in [facility] Office Visit from 12/31/2021 in [facility] Office Visit from 11/05/2021 in [facility]  Total GAD-7 Score 19 18 21       PHQ2-9    [place] Office Visit from 02/25/2022 in [facility] Office Visit from 12/31/2021 in [facility] Office Visit from 11/05/2021 in [facility]  [facility] Office Visit from 08/23/2021 in [facility] Office Visit from 07/03/2021 in [facility]  PHQ-2 Total Score 6 4 2 6 5   PHQ-9 Total Score 18 16 11 20 20       [place] Office Visit from 02/25/2022 in [facility] ED from 01/29/2022 in [facility] Department at [facility] Visit from 12/31/2021 in [place] No Risk No Risk Error: Q7 should not be populated when Q6 is No        Assessment and Plan:  AMAYLA HEITNER is a 72 y.o. 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    1. Bipolar affective disorder, currently depressed, moderate ([place]) 2. Insomnia, unspecified type R/o MDD with psychotic  features Unstable.  She continues to demonstrate disorganized thought process,  fixating on the notion of being the "antichrist," and remains preoccupied with somatic symptoms. Differentials includes depression, bipolar disorder (has this diagnosis according to her son, although she has no known manic symptoms), and/or early manifestation of neuropsychiatric symptoms secondary to underlying neurocognitive disorder.  Will uptitrate sertraline to optimize treatment for depression.  Will continue current dose of quetiapine to target depression and the rumination.    # capacity to refuse radiation Discussions with her son have raised concerns about the influence of her current mental state, which includes depression and potential dementia, on her decision-making on refusing radiation. Although the possibility of electroconvulsive therapy (ECT) or inpatient treatment to ensure medication adherence has been considered, her son reports that she has been approved for a program providing additional in-home health support. He believes her adherence to treatment will improve with the presence of a caregiver and is hopeful about the impact of this home health program. Ongoing assessment of this situation will be conducted.    R/o cognitive impairment - she was seen at [facility] clinic 12/2021. MMSE Total Score 26/30 per chart. Dx r/o late onset alzheimer. (MOCA 14 in 10/2021) A recent MRI has ruled out metastasis, and no significant abnormalities were observed to explain her cognitive impairment, except for age-appropriate volume loss and microvascular changes. It is noteworthy that there has been a considerable improvement in cognition scores, although MOCA and MMSE are not directly comparable. The potential differentials include Lewy body dementia, Alzheimer's disease, and/or depression. Further evaluation will be deferred at this time, and she has an upcoming appointment to discuss the situation.   Plan (her son  was advised to contact the clinic if she needs any refills) Increase sertraline 50 mg daily Continue quetiapine 25 mg at night (436 msec 02/2021) Obtain EKG to monitor Qtc prolongation Next appointment: 4/15 at 10 AM, IP  Her son agrees to come together - she sees a therapist, Tiffany summers LCSW, weekly at [facility],  - consent form to have 2-way conversation with her son, Elizabeth [name] is on file. -Discussed with her son to bring her to the hospital if any concern of continued decrease in appetite, any danger to self/others.     Addendum (late entry): Communicated with her oncologist, Beckey Rutter, NP on 01/01/2022. There is a recurrence of breast cancer. She understands her current medical condition per provider. She will be seen by Dr. Grayland Ormond and Vonna Kotyk  Borders, NP. They may take head CT scan. Will consider enhance head MRI if it not done at [facility] clinic/upcoming appointment.     I have reviewed suicide assessment in detail. No change in the following assessment.    The patient demonstrates the following risk factors for suicide: Chronic risk factors for suicide include: psychiatric disorder of depression . Acute risk factors for suicide include: unemployment, social withdrawal/isolation, and recent suicide attempt/discharges from inpatient psychiatry. Protective factors for this patient include: positive social support. Considering these factors, the overall suicide risk at this point appears to be moderate, but not at imminent risk. There are no guns in the house. Patient is appropriate for outpatient follow up.       Collaboration of Care: Collaboration of Care: {BH OP Collaboration of Care:21014065}  Patient/Guardian was advised Release of Information must be obtained prior to any record release in order to collaborate their care with an outside provider. Patient/Guardian was advised if they have not already done so to contact the registration department to sign all  necessary forms in order for [place] to release information regarding their care.   Consent: Patient/Guardian gives verbal consent for treatment and assignment of benefits for services provided during this visit. Patient/Guardian expressed understanding and agreed to proceed.    Norman Clay, MD 04/19/2022, 2:36 PM

## 2022-04-22 ENCOUNTER — Encounter: Payer: Self-pay | Admitting: Psychiatry

## 2022-04-22 ENCOUNTER — Ambulatory Visit
Admission: RE | Admit: 2022-04-22 | Discharge: 2022-04-22 | Disposition: A | Discharge status: Home / Self Care | Payer: Medicare HMO | Source: Ambulatory Visit | Attending: Radiation Oncology | Admitting: Radiation Oncology

## 2022-04-22 ENCOUNTER — Other Ambulatory Visit: Payer: Self-pay

## 2022-04-22 ENCOUNTER — Ambulatory Visit (INDEPENDENT_AMBULATORY_CARE_PROVIDER_SITE_OTHER): Payer: Medicare HMO | Admitting: Psychiatry

## 2022-04-22 VITALS — BP 115/72 | HR 94 | Temp 97.8°F | Ht 60.0 in | Wt 108.2 lb

## 2022-04-22 DIAGNOSIS — Z170 Estrogen receptor positive status [ER+]: Secondary | ICD-10-CM | POA: Diagnosis not present

## 2022-04-22 DIAGNOSIS — Z510 Encounter for antineoplastic radiation therapy: Secondary | ICD-10-CM | POA: Diagnosis not present

## 2022-04-22 DIAGNOSIS — F3132 Bipolar disorder, current episode depressed, moderate: Secondary | ICD-10-CM | POA: Diagnosis not present

## 2022-04-22 DIAGNOSIS — C50411 Malignant neoplasm of upper-outer quadrant of right female breast: Secondary | ICD-10-CM | POA: Diagnosis present

## 2022-04-22 DIAGNOSIS — G4700 Insomnia, unspecified: Secondary | ICD-10-CM

## 2022-04-22 LAB — RAD ONC ARIA SESSION SUMMARY
Course Elapsed Days: 55
Plan Fractions Treated to Date: 8
Plan Fractions Treated to Date: 8
Plan Prescribed Dose Per Fraction: 2 Gy
Plan Prescribed Dose Per Fraction: 2 Gy
Plan Total Fractions Prescribed: 10
Plan Total Fractions Prescribed: 10
Plan Total Prescribed Dose: 20 Gy
Plan Total Prescribed Dose: 20 Gy
Reference Point Dosage Given to Date: 16 Gy
Reference Point Dosage Given to Date: 16 Gy
Reference Point Session Dosage Given: 2 Gy
Reference Point Session Dosage Given: 2 Gy
Session Number: 36

## 2022-04-22 MED ORDER — SERTRALINE HCL 50 MG PO TABS: 50.0000 mg | ORAL_TABLET | Freq: Every day | ORAL | 0 refills | Status: DC

## 2022-04-22 MED ORDER — QUETIAPINE FUMARATE 25 MG PO TABS: 25.0000 mg | ORAL_TABLET | Freq: Every day | ORAL | 2 refills | Status: DC

## 2022-04-22 MED ORDER — QUETIAPINE FUMARATE 25 MG PO TABS: 25.0000 mg | ORAL_TABLET | Freq: Every day | ORAL | 0 refills | Status: DC

## 2022-04-22 NOTE — Patient Instructions (Addendum)
Continue sertraline 50 mg daily Continue quetiapine 25 mg at night  Next appointment: 5/28 at 3:30

## 2022-04-23 ENCOUNTER — Ambulatory Visit
Admission: RE | Admit: 2022-04-23 | Discharge: 2022-04-23 | Disposition: A | Discharge status: Home / Self Care | Payer: Medicare HMO | Source: Ambulatory Visit | Attending: Radiation Oncology | Admitting: Radiation Oncology

## 2022-04-23 ENCOUNTER — Ambulatory Visit: Payer: Medicare HMO

## 2022-04-23 ENCOUNTER — Other Ambulatory Visit: Payer: Self-pay

## 2022-04-23 DIAGNOSIS — Z510 Encounter for antineoplastic radiation therapy: Secondary | ICD-10-CM | POA: Diagnosis not present

## 2022-04-23 LAB — RAD ONC ARIA SESSION SUMMARY
Course Elapsed Days: 56
Plan Fractions Treated to Date: 9
Plan Fractions Treated to Date: 9
Plan Prescribed Dose Per Fraction: 2 Gy
Plan Prescribed Dose Per Fraction: 2 Gy
Plan Total Fractions Prescribed: 10
Plan Total Fractions Prescribed: 10
Plan Total Prescribed Dose: 20 Gy
Plan Total Prescribed Dose: 20 Gy
Reference Point Dosage Given to Date: 18 Gy
Reference Point Dosage Given to Date: 18 Gy
Reference Point Session Dosage Given: 2 Gy
Reference Point Session Dosage Given: 2 Gy
Session Number: 37

## 2022-04-24 ENCOUNTER — Inpatient Hospital Stay: Payer: Medicare HMO | Attending: Oncology

## 2022-04-24 ENCOUNTER — Ambulatory Visit
Admission: RE | Admit: 2022-04-24 | Discharge: 2022-04-24 | Disposition: A | Discharge status: Home / Self Care | Payer: Medicare HMO | Source: Ambulatory Visit | Attending: Radiation Oncology | Admitting: Radiation Oncology

## 2022-04-24 ENCOUNTER — Other Ambulatory Visit: Payer: Self-pay

## 2022-04-24 ENCOUNTER — Encounter: Payer: Self-pay | Admitting: *Deleted

## 2022-04-24 DIAGNOSIS — Z510 Encounter for antineoplastic radiation therapy: Secondary | ICD-10-CM | POA: Diagnosis not present

## 2022-04-24 LAB — RAD ONC ARIA SESSION SUMMARY
Course Elapsed Days: 57
Plan Fractions Treated to Date: 10
Plan Fractions Treated to Date: 10
Plan Prescribed Dose Per Fraction: 2 Gy
Plan Prescribed Dose Per Fraction: 2 Gy
Plan Total Fractions Prescribed: 10
Plan Total Fractions Prescribed: 10
Plan Total Prescribed Dose: 20 Gy
Plan Total Prescribed Dose: 20 Gy
Reference Point Dosage Given to Date: 20 Gy
Reference Point Dosage Given to Date: 20 Gy
Reference Point Session Dosage Given: 2 Gy
Reference Point Session Dosage Given: 2 Gy
Session Number: 38

## 2022-04-29 ENCOUNTER — Telehealth: Payer: Medicare HMO | Admitting: Hospice and Palliative Medicine

## 2022-04-30 ENCOUNTER — Other Ambulatory Visit: Payer: Medicare HMO

## 2022-04-30 VITALS — BP 110/58 | HR 88 | Temp 97.6°F | Wt 106.0 lb

## 2022-04-30 DIAGNOSIS — Z515 Encounter for palliative care: Secondary | ICD-10-CM

## 2022-04-30 NOTE — Progress Notes (Signed)
PATIENT NAME: Elizabeth [name] DOB: 09/11/1950 MRN: [id number]  PRIMARY CARE PROVIDER: Rayetta Humphrey, MD  RESPONSIBLE PARTY:  Acct ID - Guarantor Home Phone Work Phone Relationship Acct Type  [id number] Elizabeth [name], Elizabeth [name]* [phone]  Self P/F     [address], [place], [place] [address]   Home visit completed with patient and virtually with Joni Reining (daughter-in-law).  Appetite:  Eating well.  Continues with MOW.  Eating 3 meals a day per Joni Reining.  Patient had 2 bowls of cereal this morning.   Fall:  Patient rolled out of her bed about 1 month ago.  Reported back discomfort but this has improved.  Has a rolling walker but not always using.  Gait is unsteady without walker.   Hospice:  Patient continues to ask about hospice.  Follow up visit will be made after oncology appointment in May.  Uncertain if patient meets criteria at this time.  Will also discuss with son Onalee Hua if needed.   Medication Management: Patient believes psych medication adjustment was made but unsure which one.  No other medication changes were made to her knowledge.  Using a pill box but not always compliant with medications.   Mental Health:  Continues to be followed by behavioral health. Patient is not open to counseling.  Continues to focus on the negatives  Right sided Breast CA/Radiation:  Has completed treatment as of 04/24/22.  Will follow up with oncology next month.  Has a phone visit tomorrow with Elouise Munroe, NP.  Redness present to right chest wall.  Patient endorses some tenderness to this site.  Applying silvadene cream as needed to site.  Lymphedema to right arm.  Patient wearing a compression stocking to right arm.  Today she is using her compression machine.  Reports using this for about 1 hour daily.   CODE STATUS: DNR ADVANCED DIRECTIVES: No MOST FORM: No PPS: 50%   PHYSICAL EXAM:   VITALS: Today's Vitals   04/30/22 1013  BP: (!) 110/58  Pulse: 88  Temp: 97.6 F (36.4 C)  SpO2: 99%   Weight: 106 lb (48.1 kg)    LUNGS: clear to auscultation  CARDIAC: Cor RRR}  EXTREMITIES: Non-pitting edema to bilateral lower extremities.  SKIN: Skin color, texture, turgor normal. No rashes or lesions or normal see above for right chest wall. NEURO: positive for gait problems       Truitt Merle, RN

## 2022-05-01 ENCOUNTER — Inpatient Hospital Stay (HOSPITAL_BASED_OUTPATIENT_CLINIC_OR_DEPARTMENT_OTHER): Payer: Medicare HMO | Admitting: Hospice and Palliative Medicine

## 2022-05-01 DIAGNOSIS — Z515 Encounter for palliative care: Secondary | ICD-10-CM

## 2022-05-01 DIAGNOSIS — C50911 Malignant neoplasm of unspecified site of right female breast: Secondary | ICD-10-CM | POA: Diagnosis not present

## 2022-05-01 NOTE — Progress Notes (Signed)
Virtual Visit via Telephone Note  I connected with Elizabeth [name] on 05/01/22 at 11:30 AM EDT by telephone and verified that I am speaking with the correct person using two identifiers.  Location: Patient: Home Provider: Clinic   I discussed the limitations, risks, security and privacy concerns of performing an evaluation and management service by telephone and the availability of in person appointments. I also discussed with the patient that there may be a patient responsible charge related to this service. The patient expressed understanding and agreed to proceed.   History of Present Illness: Elizabeth [name] is a 72 y.o. 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  Patient is status postmastectomy but refused adjuvant treatment with exception of letrozole.  Unfortunately, she had recurrence in the right chest wall.  Palliative care was consulted to help address goals.    Observations/Objective: I spoke with patient and son via phone.  Patient feels like she is declining with weight loss.  However, son disagrees with her assessment.  Patient previously referred to hospice but ultimately opted to proceed with radiation.  She is now being followed at home by community palliative care.  Patient is pending follow-up appointment with radiation oncology in May.  Assessment and Plan: Recurrent ER/PR positive, HER2 negative breast cancer -patient is status post XRT.  Has follow-up in May with Dr. Rushie Chestnut.  Recommend continued follow-up with palliative care to coordinate further care transitions as needed.  Follow Up Instructions: Follow-up as needed   I discussed the assessment and treatment plan with the patient. The patient was provided an opportunity to ask questions and all were answered. The patient agreed with the plan and demonstrated an  understanding of the instructions.   The patient was advised to call back or seek an in-person evaluation if the symptoms worsen or if the condition fails to improve as anticipated.  I provided 10 minutes of non-face-to-face time during this encounter.   Malachy Moan, NP

## 2022-05-06 ENCOUNTER — Ambulatory Visit: Payer: Medicare HMO | Admitting: Psychiatry

## 2022-05-20 ENCOUNTER — Other Ambulatory Visit: Payer: Self-pay | Admitting: Psychiatry

## 2022-05-20 ENCOUNTER — Telehealth: Payer: Self-pay

## 2022-05-20 MED ORDER — QUETIAPINE FUMARATE 25 MG PO TABS: 25.0000 mg | ORAL_TABLET | Freq: Every day | ORAL | 0 refills | Status: DC

## 2022-05-20 NOTE — Telephone Encounter (Signed)
I called the pharmacy because it looks like rx was already sent and should be on hold.  But when I call the pharmacy they states that the rx had been canceled by dr. Vanetta Shawl so if patient is suppose to take it than a new rx will need to be sent.     Disp Refills Start End   QUEtiapine (SEROQUEL) 25 MG tablet 90 tablet 0 05/30/2022 08/28/2022   Sig - Route: Take 1 tablet (25 mg total) by mouth at bedtime. - Oral   Sent to pharmacy as: QUEtiapine (SEROQUEL) 25 MG tablet   Notes to Pharmacy: This medication is scheduled to be started on 5/9. Please fill it earlier than one week prior to avoid accumulating excess medication.   E-Prescribing Status: Receipt confirmed by pharmacy (04/22/2022  5:31 PM EDT)

## 2022-05-20 NOTE — Telephone Encounter (Signed)
pt called states she needs a refill on the quetiapine pt was last seen on 4-1 and next appt 5-28

## 2022-05-20 NOTE — Telephone Encounter (Signed)
Re-ordered (I did not cancel, so not sure what happened)

## 2022-05-23 ENCOUNTER — Encounter: Payer: Self-pay | Admitting: Radiation Oncology

## 2022-05-23 ENCOUNTER — Ambulatory Visit
Admission: RE | Admit: 2022-05-23 | Discharge: 2022-05-23 | Disposition: A | Discharge status: Home / Self Care | Payer: Medicare HMO | Source: Ambulatory Visit | Attending: Radiation Oncology | Admitting: Radiation Oncology

## 2022-05-23 ENCOUNTER — Inpatient Hospital Stay: Payer: Medicare HMO

## 2022-05-23 VITALS — BP 103/65 | HR 75 | Temp 97.6°F | Resp 14 | Ht 60.0 in | Wt 108.2 lb

## 2022-05-23 DIAGNOSIS — Z170 Estrogen receptor positive status [ER+]: Secondary | ICD-10-CM | POA: Diagnosis present

## 2022-05-23 DIAGNOSIS — C50811 Malignant neoplasm of overlapping sites of right female breast: Secondary | ICD-10-CM | POA: Diagnosis present

## 2022-05-23 NOTE — Progress Notes (Signed)
Radiation Oncology Follow up Note  Name: Elizabeth [name]   Date:   05/23/2022 MRN:  [id number] DOB: March 19, 1950    This 72 y.o. 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  She refused chemotherapy or radiation.  She developed chest wall recurrence biopsy-proven.  She also developed enlarged right axillary lymph nodes with lymphedema of the right upper extremity.  We radiated her chest wall and lymphatics she is now seen out 1 month.  She is doing well although extreme extremely poor historian.  She is having no chest pain at this time..  COMPLICATIONS OF TREATMENT: none  FOLLOW UP COMPLIANCE: keeps appointments   PHYSICAL EXAM:  BP 103/65   Pulse 75   Temp 97.6 F (36.4 C)   Resp 14   Ht 5' (1.524 m)   Wt 108 lb 3.2 oz (49.1 kg)   BMI 21.13 kg/m  Right chest wall has dry desquamation no evidence of viable tumor is still present.  Left breast is free of dominant mass.  She is wearing a lymphedema sleeve.  Well-developed well-nourished patient in NAD. HEENT reveals PERLA, EOMI, discs not visualized.  Oral cavity is clear. No oral mucosal lesions are identified. Neck is clear without evidence of cervical or supraclavicular adenopathy. Lungs are clear to A&P. Cardiac examination is essentially unremarkable with regular rate and rhythm without murmur rub or thrill. Abdomen is benign with no organomegaly or masses noted. Motor sensory and DTR levels are equal and symmetric in the upper and lower extremities. Cranial nerves II through XII are grossly intact. Proprioception is intact. No peripheral adenopathy or edema  is identified. No motor or sensory levels are noted. Crude visual fields are within normal range.  RADIOLOGY RESULTS: No current films for review  PLAN: Present time she is being followed by medical oncology as well as symptom management.  She has had a complete response to radiation therapy.  I will see her back in 5 months for follow-up.  Patient knows to call with any concerns.      Carmina Miller, MD

## 2022-05-27 ENCOUNTER — Other Ambulatory Visit: Payer: Medicare HMO

## 2022-05-29 ENCOUNTER — Telehealth: Payer: Self-pay

## 2022-05-29 NOTE — Telephone Encounter (Signed)
1424 Palliative Care Note  RN called and spoke with son. Apologized for need to cancel appt for this week and let him know RN would call back to reschedule appt. Verbalized understanding.  Barbette Merino, RN

## 2022-06-18 ENCOUNTER — Telehealth: Payer: Medicare HMO | Admitting: Psychiatry

## 2022-06-23 NOTE — Progress Notes (Deleted)
BH MD/PA/NP OP Progress Note  06/23/2022 3:41 PM Elizabeth [name]  MRN:  [id number]  Chief Complaint: No chief complaint on file.  HPI: ***  Wt Readings from Last 3 Encounters:  05/23/22 108 lb 3.2 oz (49.1 kg)  04/30/22 106 lb (48.1 kg)  04/22/22 108 lb 3.2 oz (49.1 kg)    Visit Diagnosis: No diagnosis found.  Past Psychiatric History: Please see initial evaluation for full details. I have reviewed the history. No updates at this time.     Past Medical History:  Past Medical History:  Diagnosis Date   Acute cholecystitis 2016   Anemia    Anemia 2016   Anxiety    panic attacks   Anxiety and depression    Bipolar 1 disorder (HCC)    Bowel obstruction (HCC) 2017   Common bile duct dilatation 2015   Dyspnea    Edema    Fibromyalgia    GI bleed 2017   History of kidney stones 01/21/2009   HSV-1 infection    Hyperglycemia    Hyperlipidemia    Hypokalemia    Hypothyroidism    Liver cyst    Malignant neoplasm of right female breast, unspecified estrogen receptor status, unspecified site of breast (HCC)    Memory loss    Osteopenia    Panic disorder    Schizophrenia (HCC)    Ventral hernia     Past Surgical History:  Procedure Laterality Date   ABDOMINAL HYSTERECTOMY     BREAST BIOPSY Right 01/19/2021   [place] Bx, ribbon, path pending   BREAST BIOPSY Right 01/19/2021   [place] Bx, Venus, path pending   BREAST BIOPSY Right 01/19/2021   [place] Bx, heart, path pending   BREAST BIOPSY Right 01/19/2021   [place] BX Axilla, Coil, path pending   CESAREAN SECTION     CHOLECYSTECTOMY OPEN  11/17/2013   with repair of bile duct-Dr. Egbert Garibaldi   MASTECTOMY MODIFIED RADICAL Right 03/02/2021   Procedure: MASTECTOMY MODIFIED RADICAL;  Surgeon: Sung Amabile, DO;  Location: [facility];  Service: General;  Laterality: Right;   OPEN REDUCTION INTERNAL FIXATION (ORIF) DISTAL RADIAL FRACTURE Right 12/20/2014   Procedure: OPEN REDUCTION INTERNAL FIXATION (ORIF) RIGHT DISTAL RADIAL FRACTURE;  Surgeon:  Sheral Apley, MD;  Location: [facility];  Service: Orthopedics;  Laterality: Right;   STOMACH SURGERY      Family Psychiatric History: Please see initial evaluation for full details. I have reviewed the history. No updates at this time.     Family History:  Family History  Problem Relation Age of Onset   Heart disease Father    Breast cancer Paternal Aunt    Kidney disease Paternal Aunt    Breast cancer Paternal Grandmother    Asthma Daughter    Bladder Cancer Neg Hx     Social History:  Social History   Socioeconomic History   Marital status: Married    Spouse name: Not on file   Number of children: 1   Years of education: Not on file   Highest education level: Not on file  Occupational History   Not on file  Tobacco Use   Smoking status: Former    Packs/day: 0.00    Years: 0.00    Additional pack years: 0.00    Total pack years: 0.00    Types: Cigarettes    Quit date: 11/03/1973    Years since quitting: 48.6    Passive exposure: Past   Smokeless tobacco: Never   Tobacco  comments:    Smoked briefly as a teen- lives in secondhand smoke.   Vaping Use   Vaping Use: Never used  Substance and Sexual Activity   Alcohol use: No    Alcohol/week: 0.0 standard drinks of alcohol   Drug use: No   Sexual activity: Not Currently  Other Topics Concern   Not on file  Social History Narrative   Not on file   Social Determinants of Health   Financial Resource Strain: Not on file  Food Insecurity: Not on file  Transportation Needs: Unmet Transportation Needs (04/24/2022)   PRAPARE - Administrator, Civil Service (Medical): Yes    Lack of Transportation (Non-Medical): Yes  Physical Activity: Not on file  Stress: Not on file  Social Connections: Not on file    Allergies:  Allergies  Allergen Reactions   Penicillins Hives    TOLERATED CEFAZOLIN    Metabolic Disorder Labs: No results found for: "HGBA1C", "MPG" No results found for:  "PROLACTIN" No results found for: "CHOL", "TRIG", "HDL", "CHOLHDL", "VLDL", "LDLCALC" No results found for: "TSH"  Therapeutic Level Labs: No results found for: "LITHIUM" Lab Results  Component Value Date   VALPROATE <10 (L) 07/18/2021   No results found for: "CBMZ"  Current Medications: Current Outpatient Medications  Medication Sig Dispense Refill   alendronate (FOSAMAX) 70 MG tablet Take 1 tablet by mouth every Monday.     ferrous sulfate 325 (65 FE) MG tablet Take 1 tablet by mouth daily with breakfast.     letrozole (FEMARA) 2.5 MG tablet Take 1 tablet (2.5 mg total) by mouth daily. 90 tablet 3   levothyroxine (SYNTHROID, LEVOTHROID) 88 MCG tablet Take 88 mcg by mouth daily before breakfast.     QUEtiapine (SEROQUEL) 25 MG tablet Take 1 tablet (25 mg total) by mouth at bedtime. 90 tablet 0   sertraline (ZOLOFT) 50 MG tablet Take 1 tablet (50 mg total) by mouth daily. 90 tablet 0   silver sulfADIAZINE (SILVADENE) 1 % cream Apply 1 Application topically 2 (two) times daily. 50 g 0   Vitamin D, Ergocalciferol, (DRISDOL) 1.25 MG (50000 UNIT) CAPS capsule Take 50,000 Units by mouth once a week.     No current facility-administered medications for this visit.     Musculoskeletal: Strength & Muscle Tone:  N/A Gait & Station:  N?A Patient leans: N/A  Psychiatric Specialty Exam: Review of Systems  There were no vitals taken for this visit.There is no height or weight on file to calculate BMI.  General Appearance: {Appearance:22683}  Eye Contact:  {BHH EYE CONTACT:22684}  Speech:  Clear and Coherent  Volume:  Normal  Mood:  {BHH MOOD:22306}  Affect:  {Affect (PAA):22687}  Thought Process:  Coherent  Orientation:  Full (Time, Place, and Person)  Thought Content: Logical   Suicidal Thoughts:  {ST/HT (PAA):22692}  Homicidal Thoughts:  {ST/HT (PAA):22692}  Memory:  Immediate;   Good  Judgement:  {Judgement (PAA):22694}  Insight:  {Insight (PAA):22695}  Psychomotor Activity:   Normal  Concentration:  {Concentration:21399}  Recall:  {BHH GOOD/FAIR/POOR:22877}  Fund of Knowledge: Good  Language: Good  Akathisia:  No  Handed:  Right  AIMS (if indicated): not done  Assets:  Communication Skills Desire for Improvement  ADL's:  Intact  Cognition: WNL  Sleep:  {BHH GOOD/FAIR/POOR:22877}   Screenings: GAD-7    Garment/textile technologist Visit from 02/25/2022 in [facility] Office Visit from 12/31/2021 in [facility] Office Visit  from 11/05/2021 in [facility]  Total GAD-7 Score 19 18 21       PHQ2-9    Flowsheet Row Office Visit from 04/22/2022 in [facility] Office Visit from 02/25/2022 in [facility] Office Visit from 12/31/2021 in [facility] Office Visit from 11/05/2021 in [facility] Office Visit from 08/23/2021 in [facility]  PHQ-2 Total Score 6 6 4 2 6   PHQ-9 Total Score 22 18 16 11 20       Flowsheet Row Office Visit from 04/22/2022 in [facility] Office Visit from 02/25/2022 in [facility] ED from 01/29/2022 in [facility] at [facility]  C-SSRS RISK CATEGORY Error: Q3, 4, or 5 should not be populated when Q2 is No No Risk No Risk        Assessment and Plan:  Elizabeth [name] is a 72 y.o. 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    1. Bipolar affective disorder, currently depressed, moderate 2. Insomnia, unspecified type R/o MDD with psychotic  features  Acute stressors include: undergoing radiation treatment for breast caner s/p mastectomy Other stressors include:    History:  history of bipolar disorder (has this diagnosis according to her son, although she has no known manic symptoms), r/o early manifestation of neuropsychiatric symptoms secondary to underlying neurocognitive disorder.  Unstable with slight improvement. She continues to demonstrate disorganized thought process, rumination on the notion of being "antichrist," preoccupation with somatic symptoms.  Her son reports slight improvement in her symptoms since uptitration.  Will continue sertraline to target depression.  Will continue quetiapine for depression, and rumination/delusion, insomnia.  It is noted that although her son voiced a preference to try injection, the patient is not willing to try Abilify. Will not plan to try risperidone, Haldol, or any other injection due to their risk of EPS. Both the patient and her son agree to continue with quetiapine with the hope that she will be open to exploring other treatment options in the future.   R/o cognitive impairment Functional Status   IADL: Independent in the following: managing finances, medications, driving           Requires assistance with the following:  ADL  Independent in the following: bathing and hygiene, feeding, continence, grooming and toileting, walking          Requires assistance with the following: Folate, Vtamin B12, TSH Images Brain MRI 01/2022  Brain: Diffusion imaging does not show any acute or subacute infarction. No focal abnormality affects the brainstem or cerebellum. Cerebral hemispheres show subjectively the relative absence of volume loss. There are scattered foci of T2 and FLAIR signal within the white matter consistent with mild to low moderate chronic small-vessel ischemic change. No advanced disease. No cortical or large vessel territory infarction. No mass lesion, hemorrhage, hydrocephalus or  extra-axial collection. No generalized or lobar specific volume loss. Moderate chronic small-vessel ischemic changes of the cerebral hemispheric white matter. No acute or reversible finding. Neuropsych assessment: she was seen at [facility] clinic 12/2021. Dx r/o late onset alzheimer. MMSE Total Score 26/30 per chart. (MOCA 14 in 10/2021) Etiology:  Lewy body dementia, Alzheimer's disease, and/or depression She is  currently seen at [facility] clinic, and will have PET scan, and the labs to rule out Alzheimer's disease. Will defer further evaluation at this time.    Plan  Continue sertraline 50 mg daily Continue quetiapine 25 mg at night (QTc HR 73, QTC 427 msec 03/2022) Next appointment: 5/28 at 3:30, IP  Her son agrees to come together - she sees a therapist, Tiffany summers LCSW, weekly at [facility],  - consent form to have 2-way conversation with her son, Onalee Hua is on file. -Discussed with her son to bring her to the hospital if any concern of continued decrease in appetite, any danger to self/others.      I have reviewed suicide assessment in detail. No change in the following assessment.    The patient demonstrates the following risk factors for suicide: Chronic risk factors for suicide include: psychiatric disorder of depression . Acute risk factors for suicide include: unemployment, social withdrawal/isolation, and recent suicide attempt/discharges from inpatient psychiatry. Protective factors for this patient include: positive social support. Considering these factors, the overall suicide risk at this point appears to be moderate, but not at imminent risk. There are no guns in the house. Patient is appropriate for outpatient follow up.   Collaboration of Care: Collaboration of Care: {BH OP Collaboration of Care:21014065}  Patient/Guardian was advised Release of Information must be obtained prior to any record release in order to collaborate their care with an outside  provider. Patient/Guardian was advised if they have not already done so to contact the registration department to sign all necessary forms in order for [place] to release information regarding their care.   Consent: Patient/Guardian gives verbal consent for treatment and assignment of benefits for services provided during this visit. Patient/Guardian expressed understanding and agreed to proceed.    Neysa Hotter, MD 06/23/2022, 3:41 PM

## 2022-06-24 ENCOUNTER — Encounter: Payer: Medicare HMO | Admitting: Hospice and Palliative Medicine

## 2022-06-24 ENCOUNTER — Ambulatory Visit: Payer: Medicare HMO | Admitting: Oncology

## 2022-07-02 ENCOUNTER — Telehealth: Payer: Medicare HMO | Admitting: Psychiatry

## 2022-07-10 ENCOUNTER — Other Ambulatory Visit: Payer: Medicare HMO

## 2022-07-10 DIAGNOSIS — Z515 Encounter for palliative care: Secondary | ICD-10-CM

## 2022-07-10 NOTE — Progress Notes (Signed)
PATIENT NAME: Elizabeth [name] DOB: 1950-07-23 MRN: [id number]  PRIMARY CARE PROVIDER: Rayetta Humphrey, MD  RESPONSIBLE PARTY:  Acct ID - Guarantor Home Phone Work Phone Relationship Acct Type  [id number] ZANDALEE, GEHR* [phone]  Self P/F     [address], [place], [place] [address]    I connected with David-son for  Rogelio Seen on 07/10/22 by a video enabled telemedicine application and verified that I am speaking with the correct person using two identifiers.   I discussed the limitations of evaluation and management by telemedicine. The patient expressed understanding and agreed to proceed.   Follow up call made to son Onalee Hua to complete a telephonic visit.  Son advised patient has completed radiation therapy to the right breast and this has been successful.  Area is now healed.  Follow up visit is scheduled for October with as needed visits with Dr. Orlie Dakin.  Son advised patient now has a formal dx of Dementia but not a specific dx as they were hoping for.  Additional testing were requested but patient did not want to proceed with additional testing.  Onalee Hua advised they have placed this on hold as of now.  Mental status continues to decline.  More forgetful.  Continues to go to the day program and has a Engineer, site during the week to assist with household chores and activities of daily living.   Follow up visit scheduled for 07/29/22 @ 130 pm.      Truitt Merle, RN

## 2022-07-16 NOTE — Progress Notes (Addendum)
BH MD/PA/NP OP Progress Note  07/22/2022 12:38 PM KENZINGTON KOETZ  MRN:  [id number]  Chief Complaint:  Chief Complaint  Patient presents with   Follow-up   HPI:  According to the chart review, the following events have occurred since the last visit: The patient was seen by radiation oncology for "invasive mammary carcinoma with skin involvement. She refused chemotherapy or radiation. She developed chest wall recurrence biopsy-proven. She also developed enlarged right axillary lymph nodes with lymphedema of the right upper extremity. We radiated her chest wall and lymphatics she is now seen out 1 month. She is doing well although extreme extremely poor historian."   -according to the care everywhere, she had amyloid PET 05/2022.  "Findings: There is normal tracer accumulation in the cortical white matter and normal gray-white matter differentiation in the cerebellum. There is no significant Florbetapir uptake within the cortical cerebral gray matter. Impression: Negative study indicating sparse or no amyloid neuritic plaques. "  This is a follow-up appointment for bipolar disorder, insomnia and cognitive impairment.  She is minimally responsive to questions and demonstrates an illogical thought process.  She states that she is not doing good due to "antichrists".  When she was asked about her mood, she answers "hmm mm." When she is asked if she feels down or frustrated, she states that "yes I am down and frustrated."  She states that she feels scary to look herself in the mirror due to "anti christ." She thinks the radiation treatment went well. When she is asked if she has SI, she answers "yes and no" "if you are antichrist." She does not have any plan, intent or acted on it since the last visit. She states that she thinks her medication is causing side effect of HI. However, on further elaboration, she states that "it's nothing" and denies HI. The patient has mood symptoms as in PHQ-9/GAD-7.    Onalee Hua, her son presents to the visit.  He states that there has been no significant changes since the last visit.  He wonders if there is any way she can check her dopamine level.  He states that she finds little to be happy about, which has been going on for many years. (He was informed that there is not an test for dopamine, although cortisone level can be tested by her primary care provider if she experiences other symptoms).  He thinks she talks more negative when she does not take the medication.  She misses to take medication a few times per week.  Although he let her do volunteer work at the [employer], she does not talk how it went.  Although she goes to day center, she does not initiate conversation as to how it was.  Home health comes 3 times a week. He and her family monitor her through video camera. There was a day of her throwing things. He talked about this with her, and she agreed that she did this out of being jealous to her friend. There was an instance of the stove being open and on for 1.5 hour. She told him that she was cooking. However, he is unsure as it occurred on the same day her friend left for travel. Although Onalee Hua is concerned about SI/HI, he thinks she tends to get escalated to get some attention. She has no guns at home. 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 She was recommended LP and skin biopsy at [facility] clinic. They decided  not to proceed as she was undergoing radiation. However, she did gene testing, and Onalee Hua agrees to contact them for further evaluation/follow up. He thinks she sleeps good most of the time (despite her complaining insomnia).   Wt Readings from Last 3 Encounters:  07/22/22 112 lb 3.2 oz (50.9 kg)  05/23/22 108 lb 3.2 oz (49.1 kg)  04/30/22 106 lb (48.1 kg)     Visit Diagnosis:    ICD-10-CM   1. Bipolar affective disorder, currently depressed, moderate (HCC)  F31.32     2. Insomnia, unspecified type   G47.00       Past Psychiatric History: Please see initial evaluation for full details. I have reviewed the history. No updates at this time.     Past Medical History:  Past Medical History:  Diagnosis Date   Acute cholecystitis 2016   Anemia    Anemia 2016   Anxiety    panic attacks   Anxiety and depression    Bipolar 1 disorder (HCC)    Bowel obstruction (HCC) 2017   Common bile duct dilatation 2015   Dyspnea    Edema    Fibromyalgia    GI bleed 2017   History of kidney stones 01/21/2009   HSV-1 infection    Hyperglycemia    Hyperlipidemia    Hypokalemia    Hypothyroidism    Liver cyst    Malignant neoplasm of right female breast, unspecified estrogen receptor status, unspecified site of breast (HCC)    Memory loss    Osteopenia    Panic disorder    Schizophrenia (HCC)    Ventral hernia     Past Surgical History:  Procedure Laterality Date   ABDOMINAL HYSTERECTOMY     BREAST BIOPSY Right 01/19/2021   [place] Bx, ribbon, path pending   BREAST BIOPSY Right 01/19/2021   [place] Bx, Venus, path pending   BREAST BIOPSY Right 01/19/2021   [place] Bx, heart, path pending   BREAST BIOPSY Right 01/19/2021   [place] BX Axilla, Coil, path pending   CESAREAN SECTION     CHOLECYSTECTOMY OPEN  11/17/2013   with repair of bile duct-Dr. Egbert Garibaldi   MASTECTOMY MODIFIED RADICAL Right 03/02/2021   Procedure: MASTECTOMY MODIFIED RADICAL;  Surgeon: Sung Amabile, DO;  Location: [facility];  Service: General;  Laterality: Right;   OPEN REDUCTION INTERNAL FIXATION (ORIF) DISTAL RADIAL FRACTURE Right 12/20/2014   Procedure: OPEN REDUCTION INTERNAL FIXATION (ORIF) RIGHT DISTAL RADIAL FRACTURE;  Surgeon: Sheral Apley, MD;  Location: [facility];  Service: Orthopedics;  Laterality: Right;   STOMACH SURGERY      Family Psychiatric History: Please see initial evaluation for full details. I have reviewed the history. No updates at this time.     Family History:  Family History  Problem  Relation Age of Onset   Heart disease Father    Breast cancer Paternal Aunt    Kidney disease Paternal Aunt    Breast cancer Paternal Grandmother    Asthma Daughter    Bladder Cancer Neg Hx     Social History:  Social History   Socioeconomic History   Marital status: Married    Spouse name: Not on file   Number of children: 1   Years of education: Not on file   Highest education level: Not on file  Occupational History   Not on file  Tobacco Use   Smoking status: Former    Packs/day: 0.00    Years: 0.00    Additional pack years:  0.00    Total pack years: 0.00    Types: Cigarettes    Quit date: 11/03/1973    Years since quitting: 48.7    Passive exposure: Past   Smokeless tobacco: Never   Tobacco comments:    Smoked briefly as a teen- lives in secondhand smoke.   Vaping Use   Vaping Use: Never used  Substance and Sexual Activity   Alcohol use: No    Alcohol/week: 0.0 standard drinks of alcohol   Drug use: No   Sexual activity: Not Currently  Other Topics Concern   Not on file  Social History Narrative   Not on file   Social Determinants of Health   Financial Resource Strain: Not on file  Food Insecurity: Not on file  Transportation Needs: Unmet Transportation Needs (04/24/2022)   PRAPARE - Administrator, Civil Service (Medical): Yes    Lack of Transportation (Non-Medical): Yes  Physical Activity: Not on file  Stress: Not on file  Social Connections: Not on file    Allergies:  Allergies  Allergen Reactions   Penicillins Hives    TOLERATED CEFAZOLIN    Metabolic Disorder Labs: No results found for: "HGBA1C", "MPG" No results found for: "PROLACTIN" No results found for: "CHOL", "TRIG", "HDL", "CHOLHDL", "VLDL", "LDLCALC" No results found for: "TSH"  Therapeutic Level Labs: No results found for: "LITHIUM" Lab Results  Component Value Date   VALPROATE <10 (L) 07/18/2021   No results found for: "CBMZ"  Current Medications: Current  Outpatient Medications  Medication Sig Dispense Refill   alendronate (FOSAMAX) 70 MG tablet Take 1 tablet by mouth every Monday.     ferrous sulfate 325 (65 FE) MG tablet Take 1 tablet by mouth daily with breakfast.     letrozole (FEMARA) 2.5 MG tablet Take 1 tablet (2.5 mg total) by mouth daily. 90 tablet 3   levothyroxine (SYNTHROID, LEVOTHROID) 88 MCG tablet Take 88 mcg by mouth daily before breakfast.     silver sulfADIAZINE (SILVADENE) 1 % cream Apply 1 Application topically 2 (two) times daily. 50 g 0   Vitamin D, Ergocalciferol, (DRISDOL) 1.25 MG (50000 UNIT) CAPS capsule Take 50,000 Units by mouth once a week.     [START ON 08/28/2022] QUEtiapine (SEROQUEL) 25 MG tablet Take 1 tablet (25 mg total) by mouth at bedtime. 90 tablet 0   [START ON 08/24/2022] sertraline (ZOLOFT) 50 MG tablet Take 1 tablet (50 mg total) by mouth daily. 90 tablet 0   No current facility-administered medications for this visit.     Musculoskeletal: Strength & Muscle Tone: within normal limits Gait & Station:  uses a walker Patient leans: N/A  Psychiatric Specialty Exam: Review of Systems  Psychiatric/Behavioral:  Positive for decreased concentration, dysphoric mood, sleep disturbance and suicidal ideas. Negative for agitation, behavioral problems, confusion, hallucinations and self-injury. The patient is nervous/anxious. The patient is not hyperactive.   All other systems reviewed and are negative.   Blood pressure 114/71, pulse 84, temperature (!) 96.8 F (36 C), temperature source Skin, height 5' (1.524 m), weight 112 lb 3.2 oz (50.9 kg).Body mass index is 21.91 kg/m.  General Appearance: Fairly Groomed  Eye Contact:  Minimal  Speech:  Clear and Coherent  Volume:  Normal  Mood:   not good  Affect:  Restricted  Thought Process:  Disorganized  Orientation:  Full (Time, Place, and Person)  Thought Content: Logical   Suicidal Thoughts:  Yes.  without intent/plan  Homicidal Thoughts:  Yes.  without  intent/plan  Memory:  Immediate;   Fair  Judgement:  Impaired  Insight:  Lacking  Psychomotor Activity:  Normal, Normal tone, no rigidity, no resting/postural tremors, no tardive dyskinesia    Concentration:  Concentration: Fair and Attention Span: Poor  Recall:  Fair  Fund of Knowledge: Good  Language: Good  Akathisia:  No  Handed:  Right  AIMS (if indicated): not done  Assets:  Social Support  ADL's:  Intact  Cognition: WNL  Sleep:  Fair   Screenings: GAD-7    Garment/textile technologist Visit from 07/22/2022 in [facility] Visit from 02/25/2022 in [facility] Office Visit from 12/31/2021 in [facility] Office Visit from 11/05/2021 in [facility]  Total GAD-7 Score 21 19 18 21       PHQ2-9    Flowsheet Row Office Visit from 07/22/2022 in [facility] Office Visit from 04/22/2022 in [facility] Visit from 02/25/2022 in [facility] Office Visit from 12/31/2021 in [facility] Office Visit from 11/05/2021 in [facility]  PHQ-2 Total Score 6 6 6 4 2   PHQ-9 Total Score 27 22 18 16 11       Flowsheet Row Office Visit from 07/22/2022 in [facility] Office Visit from 04/22/2022 in [facility] Office Visit from 02/25/2022 in [facility]  C-SSRS RISK CATEGORY Error: Q3, 4, or 5 should not be populated when Q2 is No Error: Q3, 4, or 5 should not be populated when Q2 is No No Risk        Assessment and Plan:  DELETHA GOERING is a 72 y.o. 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   1. Bipolar affective disorder, currently depressed, moderate (HCC) 2. Insomnia, unspecified type R/o MDD with psychotic features  Acute stressors include: undergoing radiation treatment for breast caner s/p mastectomy Other stressors include:    History:  history of bipolar disorder (has this diagnosis according to her son, although she has no known manic symptoms), r/o early manifestation of neuropsychiatric symptoms secondary to underlying neurocognitive disorder.  She continues to demonstrate disorganized thought process, rumination on the notion of being antichrist, preoccupation with somatic symptoms. Differential includes depression/bipolar, and neurocognitive disorder. The care has been challenging due to no adherence to medication. After having discussed treatment options with her son, will plan to continue the same dose of sertraline to target depression along with quetiapine to target depression/insomnia. Given that she may have more frequent visits for her care, this approach aims to improve adherence.  Noted that although ECT was previously discussed, neither the patient nor her son is interested in this treatment.    R/o cognitive impairment Functional Status   IADL: Independent in the following: managing finances, medications, driving           Requires assistance with the following:  ADL  Independent in the following: bathing and hygiene, feeding, continence, grooming and toileting, walking          Requires assistance with the following: Folate, Vitamin B12, TSH Images Brain MRI 01/2022  Brain: Diffusion imaging does not show any acute or  subacute infarction. No focal abnormality affects the brainstem or cerebellum. Cerebral hemispheres show subjectively the relative absence of volume loss. There are scattered foci of  T2 and FLAIR signal within the white matter consistent with mild to low moderate chronic small-vessel ischemic change. No advanced disease. No cortical or large vessel territory infarction. No mass lesion, hemorrhage, hydrocephalus or extra-axial collection. No generalized or lobar specific volume loss. Moderate chronic small-vessel ischemic changes of the cerebral hemispheric white matter. No acute or reversible finding.  amyloid PET 05/2022.: Negative study indicating sparse or no amyloid neuritic plaque Neuropsych assessment: she was seen at [facility] clinic 12/2021. Dx r/o late onset alzheimer. MMSE Total Score 26/30 per chart. (MOCA 14 in 10/2021) Etiology:  Lewy body dementia, Alzheimer's disease, and/or depression I extensively reviewed chart outside of the system. PET scan was not conclusive for Alzheimer's disease.  It has been very difficult to tease out whether her current psychotic natures are only secondary to mood symptoms and or any component of neurodegenerative disorder. Her son was advised to reach out to them for ongoing evaluation.   Plan  Continue sertraline 50 mg daily Continue quetiapine 25 mg at night (QTc HR 73, QTC 427 msec 03/2022) Next appointment: 8/26 at 11 am, IP - she sees a therapist, Tiffany summers LCSW, weekly at [facility],  - consent form to have 2-way conversation with her son, Onalee Hua is on file.    The patient demonstrates the following risk factors for suicide: Chronic risk factors for suicide include: psychiatric disorder of depression . Acute risk factors for suicide include: unemployment, social withdrawal/isolation, and recent suicide attempt/discharges from inpatient psychiatry. Protective factors for this patient include: positive social support. Considering these factors, the overall suicide risk at this point appears to be moderate, but not at imminent risk. There are no guns in the house. Patient is appropriate for outpatient follow up.      Collaboration of Care: Collaboration of Care: Other reviewed notes in Epic  Patient/Guardian was advised Release of Information must be obtained prior to any record release in order to collaborate their care with an outside provider. Patient/Guardian was advised if they have not already done so to contact the registration department to sign all necessary forms in order for [place] to release information regarding their care.   Consent: Patient/Guardian gives verbal consent for treatment and assignment of benefits for services provided during this visit. Patient/Guardian expressed understanding and agreed to proceed.    The duration of the time spent on the following activities on the date of the encounter was 40 minutes.   Preparing to see the patient (e.g., review of test, records)  Obtaining and/or reviewing separately obtained history  Performing a medically necessary exam and/or evaluation  Counseling and educating the patient/family/caregiver  Ordering medications, tests, or procedures  Referring and communicating with other healthcare professionals (when not reported separately)  Documenting clinical information in the electronic or paper health record  Independently interpreting results of tests/labs and communication of results to the family or caregiver  Care coordination (when not reported separately)   Neysa Hotter, MD 07/22/2022, 12:38 PM

## 2022-07-22 ENCOUNTER — Ambulatory Visit (INDEPENDENT_AMBULATORY_CARE_PROVIDER_SITE_OTHER): Payer: Medicare HMO | Admitting: Psychiatry

## 2022-07-22 ENCOUNTER — Encounter: Payer: Self-pay | Admitting: Psychiatry

## 2022-07-22 VITALS — BP 114/71 | HR 84 | Temp 96.8°F | Ht 60.0 in | Wt 112.2 lb

## 2022-07-22 DIAGNOSIS — G4700 Insomnia, unspecified: Secondary | ICD-10-CM | POA: Diagnosis not present

## 2022-07-22 DIAGNOSIS — F3132 Bipolar disorder, current episode depressed, moderate: Secondary | ICD-10-CM | POA: Diagnosis not present

## 2022-07-22 MED ORDER — QUETIAPINE FUMARATE 25 MG PO TABS: 25.0000 mg | ORAL_TABLET | Freq: Every day | ORAL | 0 refills | Status: DC

## 2022-07-22 MED ORDER — SERTRALINE HCL 50 MG PO TABS: 50.0000 mg | ORAL_TABLET | Freq: Every day | ORAL | 0 refills | Status: DC

## 2022-08-16 ENCOUNTER — Emergency Department: Payer: Medicare HMO

## 2022-08-16 ENCOUNTER — Observation Stay: Payer: Medicare HMO

## 2022-08-16 ENCOUNTER — Inpatient Hospital Stay
Admission: EM | Admit: 2022-08-16 | Discharge: 2022-08-19 | DRG: 312 | Disposition: A | Discharge status: Home Health | Payer: Medicare HMO | Attending: Internal Medicine | Admitting: Internal Medicine

## 2022-08-16 ENCOUNTER — Encounter: Payer: Self-pay | Admitting: Internal Medicine

## 2022-08-16 ENCOUNTER — Other Ambulatory Visit: Payer: Self-pay

## 2022-08-16 DIAGNOSIS — Z9011 Acquired absence of right breast and nipple: Secondary | ICD-10-CM

## 2022-08-16 DIAGNOSIS — D509 Iron deficiency anemia, unspecified: Secondary | ICD-10-CM | POA: Diagnosis present

## 2022-08-16 DIAGNOSIS — I890 Lymphedema, not elsewhere classified: Secondary | ICD-10-CM | POA: Diagnosis present

## 2022-08-16 DIAGNOSIS — Z825 Family history of asthma and other chronic lower respiratory diseases: Secondary | ICD-10-CM

## 2022-08-16 DIAGNOSIS — W19XXXA Unspecified fall, initial encounter: Secondary | ICD-10-CM | POA: Diagnosis not present

## 2022-08-16 DIAGNOSIS — Z90710 Acquired absence of both cervix and uterus: Secondary | ICD-10-CM

## 2022-08-16 DIAGNOSIS — Z7983 Long term (current) use of bisphosphonates: Secondary | ICD-10-CM

## 2022-08-16 DIAGNOSIS — F0393 Unspecified dementia, unspecified severity, with mood disturbance: Secondary | ICD-10-CM | POA: Diagnosis present

## 2022-08-16 DIAGNOSIS — F0394 Unspecified dementia, unspecified severity, with anxiety: Secondary | ICD-10-CM | POA: Diagnosis present

## 2022-08-16 DIAGNOSIS — Y92009 Unspecified place in unspecified non-institutional (private) residence as the place of occurrence of the external cause: Secondary | ICD-10-CM

## 2022-08-16 DIAGNOSIS — M1612 Unilateral primary osteoarthritis, left hip: Secondary | ICD-10-CM | POA: Diagnosis present

## 2022-08-16 DIAGNOSIS — I951 Orthostatic hypotension: Secondary | ICD-10-CM | POA: Diagnosis not present

## 2022-08-16 DIAGNOSIS — E861 Hypovolemia: Secondary | ICD-10-CM | POA: Diagnosis present

## 2022-08-16 DIAGNOSIS — Z79890 Hormone replacement therapy (postmenopausal): Secondary | ICD-10-CM

## 2022-08-16 DIAGNOSIS — M797 Fibromyalgia: Secondary | ICD-10-CM | POA: Diagnosis present

## 2022-08-16 DIAGNOSIS — S32592S Other specified fracture of left pubis, sequela: Secondary | ICD-10-CM

## 2022-08-16 DIAGNOSIS — Z853 Personal history of malignant neoplasm of breast: Secondary | ICD-10-CM

## 2022-08-16 DIAGNOSIS — F419 Anxiety disorder, unspecified: Secondary | ICD-10-CM | POA: Diagnosis present

## 2022-08-16 DIAGNOSIS — M25552 Pain in left hip: Secondary | ICD-10-CM | POA: Diagnosis present

## 2022-08-16 DIAGNOSIS — R55 Syncope and collapse: Principal | ICD-10-CM | POA: Diagnosis present

## 2022-08-16 DIAGNOSIS — F319 Bipolar disorder, unspecified: Secondary | ICD-10-CM | POA: Diagnosis present

## 2022-08-16 DIAGNOSIS — Z8249 Family history of ischemic heart disease and other diseases of the circulatory system: Secondary | ICD-10-CM

## 2022-08-16 DIAGNOSIS — M8580 Other specified disorders of bone density and structure, unspecified site: Secondary | ICD-10-CM | POA: Diagnosis present

## 2022-08-16 DIAGNOSIS — R197 Diarrhea, unspecified: Secondary | ICD-10-CM | POA: Diagnosis present

## 2022-08-16 DIAGNOSIS — E039 Hypothyroidism, unspecified: Secondary | ICD-10-CM | POA: Diagnosis present

## 2022-08-16 DIAGNOSIS — Z87891 Personal history of nicotine dependence: Secondary | ICD-10-CM

## 2022-08-16 DIAGNOSIS — Z79899 Other long term (current) drug therapy: Secondary | ICD-10-CM

## 2022-08-16 DIAGNOSIS — Z79811 Long term (current) use of aromatase inhibitors: Secondary | ICD-10-CM

## 2022-08-16 DIAGNOSIS — E785 Hyperlipidemia, unspecified: Secondary | ICD-10-CM | POA: Diagnosis present

## 2022-08-16 DIAGNOSIS — Z803 Family history of malignant neoplasm of breast: Secondary | ICD-10-CM

## 2022-08-16 DIAGNOSIS — S32501A Unspecified fracture of right pubis, initial encounter for closed fracture: Secondary | ICD-10-CM | POA: Diagnosis not present

## 2022-08-16 DIAGNOSIS — R7303 Prediabetes: Secondary | ICD-10-CM | POA: Diagnosis present

## 2022-08-16 DIAGNOSIS — F32A Depression, unspecified: Secondary | ICD-10-CM

## 2022-08-16 DIAGNOSIS — Z66 Do not resuscitate: Secondary | ICD-10-CM | POA: Diagnosis present

## 2022-08-16 DIAGNOSIS — Z880 Allergy status to penicillin: Secondary | ICD-10-CM

## 2022-08-16 DIAGNOSIS — M62838 Other muscle spasm: Secondary | ICD-10-CM | POA: Diagnosis present

## 2022-08-16 DIAGNOSIS — Z5986 Financial insecurity: Secondary | ICD-10-CM

## 2022-08-16 DIAGNOSIS — W010XXA Fall on same level from slipping, tripping and stumbling without subsequent striking against object, initial encounter: Secondary | ICD-10-CM | POA: Diagnosis present

## 2022-08-16 DIAGNOSIS — S7002XA Contusion of left hip, initial encounter: Secondary | ICD-10-CM | POA: Diagnosis present

## 2022-08-16 DIAGNOSIS — F209 Schizophrenia, unspecified: Secondary | ICD-10-CM | POA: Diagnosis present

## 2022-08-16 LAB — COMPREHENSIVE METABOLIC PANEL
ALT: 14 U/L (ref 0–44)
AST: 18 U/L (ref 15–41)
Albumin: 3 g/dL — ABNORMAL LOW (ref 3.5–5.0)
Alkaline Phosphatase: 97 U/L (ref 38–126)
Anion gap: 9 (ref 5–15)
BUN: 13 mg/dL (ref 8–23)
CO2: 25 mmol/L (ref 22–32)
Calcium: 8.5 mg/dL — ABNORMAL LOW (ref 8.9–10.3)
Chloride: 104 mmol/L (ref 98–111)
Creatinine, Ser: 0.72 mg/dL (ref 0.44–1.00)
GFR, Estimated: >60 mL/min (ref >60)
Glucose, Bld: 103 mg/dL — ABNORMAL HIGH (ref 70–99)
Potassium: 3.8 mmol/L (ref 3.5–5.1)
Sodium: 138 mmol/L (ref 135–145)
Total Bilirubin: 0.5 mg/dL (ref 0.3–1.2)
Total Protein: 6.7 g/dL (ref 6.5–8.1)

## 2022-08-16 LAB — PROTIME-INR
INR: 1.1 (ref 0.8–1.2)
Prothrombin Time: 14.2 seconds (ref 11.4–15.2)

## 2022-08-16 LAB — TROPONIN I (HIGH SENSITIVITY): Troponin I (High Sensitivity): 4 ng/L (ref <18)

## 2022-08-16 LAB — CBC WITH DIFFERENTIAL/PLATELET
Abs Immature Granulocytes: 0.01 10*3/uL (ref 0.00–0.07)
Basophils Absolute: 0.1 10*3/uL (ref 0.0–0.1)
Basophils Relative: 1 %
Eosinophils Absolute: 0.2 10*3/uL (ref 0.0–0.5)
Eosinophils Relative: 4 %
HCT: 32.1 % — ABNORMAL LOW (ref 36.0–46.0)
Hemoglobin: 10.5 g/dL — ABNORMAL LOW (ref 12.0–15.0)
Immature Granulocytes: 0 %
Lymphocytes Relative: 17 %
Lymphs Abs: 0.8 10*3/uL (ref 0.7–4.0)
MCH: 30.9 pg (ref 26.0–34.0)
MCHC: 32.7 g/dL (ref 30.0–36.0)
MCV: 94.4 fL (ref 80.0–100.0)
Monocytes Absolute: 0.3 10*3/uL (ref 0.1–1.0)
Monocytes Relative: 7 %
Neutro Abs: 3.3 10*3/uL (ref 1.7–7.7)
Neutrophils Relative %: 71 %
Platelets: 319 10*3/uL (ref 150–400)
RBC: 3.4 MIL/uL — ABNORMAL LOW (ref 3.87–5.11)
RDW: 12.6 % (ref 11.5–15.5)
WBC: 4.6 10*3/uL (ref 4.0–10.5)
nRBC: 0 % (ref 0.0–0.2)

## 2022-08-16 LAB — APTT: aPTT: 25 seconds (ref 24–36)

## 2022-08-16 LAB — TYPE AND SCREEN
ABO/RH(D): O POS
Antibody Screen: NEGATIVE

## 2022-08-16 LAB — CK: Total CK: 38 U/L (ref 38–234)

## 2022-08-16 MED ORDER — FERROUS SULFATE 325 (65 FE) MG PO TABS
325.0000 mg | ORAL_TABLET | Freq: Every day | ORAL | Status: DC
  Administered 2022-08-17 – 2022-08-19 (×3): 325 mg via ORAL
  Filled 2022-08-16 (×3): qty 1

## 2022-08-16 MED ORDER — SENNOSIDES-DOCUSATE SODIUM 8.6-50 MG PO TABS: 1.0000 | ORAL_TABLET | Freq: Every evening | ORAL | Status: DC | PRN

## 2022-08-16 MED ORDER — LEVOTHYROXINE SODIUM 88 MCG PO TABS
88.0000 ug | ORAL_TABLET | Freq: Every day | ORAL | Status: DC
  Administered 2022-08-17 – 2022-08-19 (×3): 88 ug via ORAL
  Filled 2022-08-16 (×3): qty 1

## 2022-08-16 MED ORDER — SERTRALINE HCL 50 MG PO TABS
50.0000 mg | ORAL_TABLET | Freq: Every day | ORAL | Status: DC
  Administered 2022-08-16 – 2022-08-18 (×3): 50 mg via ORAL
  Filled 2022-08-16 (×3): qty 1

## 2022-08-16 MED ORDER — SODIUM CHLORIDE 0.9 % IV SOLN: INTRAVENOUS | Status: DC

## 2022-08-16 MED ORDER — ONDANSETRON HCL 4 MG/2ML IJ SOLN: 4.0000 mg | Freq: Three times a day (TID) | INTRAMUSCULAR | Status: DC | PRN

## 2022-08-16 MED ORDER — LIDOCAINE 5 % EX PTCH
1.0000 | MEDICATED_PATCH | CUTANEOUS | Status: DC
  Filled 2022-08-16: qty 1

## 2022-08-16 MED ORDER — OXYCODONE-ACETAMINOPHEN 5-325 MG PO TABS
1.0000 | ORAL_TABLET | ORAL | Status: DC | PRN
  Administered 2022-08-16: 1 via ORAL
  Filled 2022-08-16 (×2): qty 1

## 2022-08-16 MED ORDER — QUETIAPINE FUMARATE 25 MG PO TABS
25.0000 mg | ORAL_TABLET | Freq: Every day | ORAL | Status: DC
  Administered 2022-08-16 – 2022-08-18 (×3): 25 mg via ORAL
  Filled 2022-08-16 (×3): qty 1

## 2022-08-16 MED ORDER — LORAZEPAM 2 MG/ML IJ SOLN: 0.5000 mg | Freq: Four times a day (QID) | INTRAMUSCULAR | Status: DC | PRN

## 2022-08-16 MED ORDER — MORPHINE SULFATE (PF) 2 MG/ML IV SOLN: 2.0000 mg | INTRAVENOUS | Status: DC | PRN

## 2022-08-16 MED ORDER — ACETAMINOPHEN 325 MG PO TABS: 650.0000 mg | ORAL_TABLET | Freq: Four times a day (QID) | ORAL | Status: DC | PRN

## 2022-08-16 MED ORDER — KETOROLAC TROMETHAMINE 15 MG/ML IJ SOLN
15.0000 mg | Freq: Four times a day (QID) | INTRAMUSCULAR | Status: AC
  Administered 2022-08-16 – 2022-08-17 (×3): 15 mg via INTRAVENOUS
  Filled 2022-08-16 (×3): qty 1

## 2022-08-16 MED ORDER — LETROZOLE 2.5 MG PO TABS: 2.5000 mg | ORAL_TABLET | Freq: Every day | ORAL | Status: DC

## 2022-08-16 MED ORDER — METHOCARBAMOL 500 MG PO TABS: 500.0000 mg | ORAL_TABLET | Freq: Three times a day (TID) | ORAL | Status: DC | PRN

## 2022-08-16 MED ORDER — LETROZOLE 2.5 MG PO TABS
2.5000 mg | ORAL_TABLET | Freq: Every day | ORAL | Status: DC
  Administered 2022-08-17 – 2022-08-18 (×2): 2.5 mg via ORAL
  Filled 2022-08-16 (×2): qty 1

## 2022-08-16 NOTE — ED Notes (Signed)
Pt to be transported to room from CT by tech

## 2022-08-16 NOTE — H&P (Signed)
History and Physical    Elizabeth [name] OZH:[id number] DOB: Apr 10, 1950 DOA: 08/16/2022  Referring MD/NP/PA:   PCP: [facility]   Patient coming from:  The patient is coming from home.     Chief Complaint: syncope and fall, diarrhea  HPI: Elizabeth [name] is a 73 y.o. female with medical history significant of pre-DM, hypothyroidism, GI bleeding, bipolar, schizophrenia, panic disorder, depression with anxiety, anemia, fibromyalgia, breast cancer (s/p for right mastectomy), right lymphedema, who presents with syncope and fall, diarrhea.  Patient states that she has diarrhea for more than 4 days, about 4 times of watery diarrhea each day.  Denies nausea, vomiting or abdominal pain.  Per her daughter-in-law at the bedside, pt passed out and fell in the bathroom in at about 7:15 AM by home camera recorder. Pt was on the floor for about an hour. She hit her head, but no headache or neck pain. She injured her left hip, causing left hip pain, which is constant, sharp, severe, nonradiating, associated with muscle spasm in left leg.  She also reports pain in her tailbone and left knee. Denies chest pain, cough, fever or chills.  Patient has mild shortness of breath.  No symptoms of UTI.  Data reviewed independently and ED Course: pt was found to have WBC 4.6, electrolytes renal function okay, temperature 99.9, blood pressure 121/87, heart rate 89, RR 18, oxygen saturation 100% on room air.  CT head negative.  X-ray of her left knee negative for fracture.  Patient is placed head of bed for obs, Dr. Audelia Acton of Ortho is consulted.  X-ray of left hip/pelvis: 1. Subacute/healing fracture of the left superior and inferior pubic rami. 2. Moderate-to-severe degenerative changes of the left hip joint.   CT-left hip: 1. Subacute, healing fractures of the left superior and inferior pubic rami and left sacral ala. 2. No evidence of acute fracture or dislocation. 3. Advanced left hip  osteoarthritis.    EKG: I have personally reviewed.  Sinus rhythm, QTc 441, nonspecific T wave change   Review of Systems:   General: no fevers, chills, no body weight gain, has fatigue HEENT: no blurry vision, hearing changes or sore throat Respiratory: has mild dyspnea, coughing, wheezing CV: no chest pain, no palpitations GI: no nausea, vomiting, abdominal pain, diarrhea, constipation GU: no dysuria, burning on urination, increased urinary frequency, hematuria  Ext: no leg edema Neuro: no unilateral weakness, numbness, or tingling, no vision change or hearing loss. Has fall and syncope Skin: no rash, no skin tear. MSK: has muscle spasm in left leg, has pain in left hip, left knee and tailbone Heme: No easy bruising.  Travel history: No recent long distant travel.   Allergy:  Allergies  Allergen Reactions   Penicillins Hives    TOLERATED CEFAZOLIN    Past Medical History:  Diagnosis Date   Acute cholecystitis 2016   Anemia    Anemia 2016   Anxiety    panic attacks   Anxiety and depression    Bipolar 1 disorder (HCC)    Bowel obstruction (HCC) 2017   Common bile duct dilatation 2015   Dyspnea    Edema    Fibromyalgia    GI bleed 2017   History of kidney stones 01/21/2009   HSV-1 infection    Hyperglycemia    Hyperlipidemia    Hypokalemia    Hypothyroidism    Liver cyst    Malignant neoplasm of right female breast, unspecified estrogen receptor status, unspecified site of breast (  HCC)    Memory loss    Osteopenia    Panic disorder    Schizophrenia (HCC)    Ventral hernia     Past Surgical History:  Procedure Laterality Date   ABDOMINAL HYSTERECTOMY     BREAST BIOPSY Right 01/19/2021   [place] Bx, ribbon, path pending   BREAST BIOPSY Right 01/19/2021   [place] Bx, Venus, path pending   BREAST BIOPSY Right 01/19/2021   [place] Bx, heart, path pending   BREAST BIOPSY Right 01/19/2021   [place] BX Axilla, Coil, path pending   CESAREAN SECTION     CHOLECYSTECTOMY OPEN   11/17/2013   with repair of bile duct-Dr. Egbert Garibaldi   MASTECTOMY MODIFIED RADICAL Right 03/02/2021   Procedure: MASTECTOMY MODIFIED RADICAL;  Surgeon: Sung Amabile, DO;  Location: [facility];  Service: General;  Laterality: Right;   OPEN REDUCTION INTERNAL FIXATION (ORIF) DISTAL RADIAL FRACTURE Right 12/20/2014   Procedure: OPEN REDUCTION INTERNAL FIXATION (ORIF) RIGHT DISTAL RADIAL FRACTURE;  Surgeon: Sheral Apley, MD;  Location: [facility];  Service: Orthopedics;  Laterality: Right;   STOMACH SURGERY      Social History:  reports that she quit smoking about 48 years ago. Her smoking use included cigarettes. She has been exposed to tobacco smoke. She has never used smokeless tobacco. She reports that she does not drink alcohol and does not use drugs.  Family History:  Family History  Problem Relation Age of Onset   Heart disease Father    Breast cancer Paternal Aunt    Kidney disease Paternal Aunt    Breast cancer Paternal Grandmother    Asthma Daughter    Bladder Cancer Neg Hx      Prior to Admission medications   Medication Sig Start Date End Date Taking? Authorizing Provider  alendronate (FOSAMAX) 70 MG tablet Take 1 tablet by mouth every Monday. 03/09/20   [provider]  ferrous sulfate 325 (65 FE) MG tablet Take 1 tablet by mouth daily with breakfast. 08/15/21   [provider]  letrozole (FEMARA) 2.5 MG tablet Take 1 tablet (2.5 mg total) by mouth daily. 07/19/21   Arnaldo Natal, MD  levothyroxine (SYNTHROID, LEVOTHROID) 88 MCG tablet Take 88 mcg by mouth daily before breakfast.    [provider]  QUEtiapine (SEROQUEL) 25 MG tablet Take 1 tablet (25 mg total) by mouth at bedtime. 08/28/22 11/26/22  Neysa Hotter, MD  sertraline (ZOLOFT) 50 MG tablet Take 1 tablet (50 mg total) by mouth daily. 08/24/22 11/22/22  Neysa Hotter, MD  silver sulfADIAZINE (SILVADENE) 1 % cream Apply 1 Application topically 2 (two) times daily. 03/25/22   Carmina Miller,  MD  Vitamin D, Ergocalciferol, (DRISDOL) 1.25 MG (50000 UNIT) CAPS capsule Take 50,000 Units by mouth once a week. 07/02/21   [provider]    Physical Exam: Vitals:   08/16/22 1440 08/16/22 1500 08/16/22 1530 08/16/22 1638  BP: 126/67 115/67 121/87 131/88  Pulse: 72 66 72 74  Resp: 17 18 17 18   Temp:    98.3 F (36.8 C)  TempSrc:    Oral  SpO2: 100% 100% 100% 100%  Weight:      Height:       General: Not in acute distress HEENT:       Eyes: PERRL, EOMI, no jaundice       ENT: No discharge from the ears and nose, no pharynx injection, no tonsillar enlargement.        Neck: No JVD, no bruit,  no mass felt. Heme: No neck lymph node enlargement. Cardiac: S1/S2, RRR, No murmurs, No gallops or rubs. Respiratory: No rales, wheezing, rhonchi or rubs. GI: Soft, nondistended, nontender, no rebound pain, no organomegaly, BS present. GU: No hematuria Ext: No pitting leg edema bilaterally. 1+DP/PT pulse bilaterally. Musculoskeletal: Has tenderness in left hip, left knee and tailbone Skin: No rashes.  Neuro: Alert, oriented X3, cranial nerves II-XII grossly intact, moves all extremities normally. Psych: Patient is not psychotic, no suicidal or hemocidal ideation.  Labs on Admission: I have personally reviewed following labs and imaging studies  CBC: Recent Labs  Lab 08/16/22 1335  WBC 4.6  NEUTROABS 3.3  HGB 10.5*  HCT 32.1*  MCV 94.4  PLT 319   Basic Metabolic Panel: Recent Labs  Lab 08/16/22 1335  NA 138  K 3.8  CL 104  CO2 25  GLUCOSE 103*  BUN 13  CREATININE 0.72  CALCIUM 8.5*   GFR: Estimated Creatinine Clearance: 46.3 mL/min (by C-G formula based on SCr of 0.72 mg/dL). Liver Function Tests: Recent Labs  Lab 08/16/22 1335  AST 18  ALT 14  ALKPHOS 97  BILITOT 0.5  PROT 6.7  ALBUMIN 3.0*   No results for input(s): "LIPASE", "AMYLASE" in the last 168 hours. No results for input(s): "AMMONIA" in the last 168 hours. Coagulation Profile: No  results for input(s): "INR", "PROTIME" in the last 168 hours. Cardiac Enzymes: Recent Labs  Lab 08/16/22 1335  CKTOTAL 38   BNP (last 3 results) No results for input(s): "PROBNP" in the last 8760 hours. HbA1C: No results for input(s): "HGBA1C" in the last 72 hours. CBG: No results for input(s): "GLUCAP" in the last 168 hours. Lipid Profile: No results for input(s): "CHOL", "HDL", "LDLCALC", "TRIG", "CHOLHDL", "LDLDIRECT" in the last 72 hours. Thyroid Function Tests: No results for input(s): "TSH", "T4TOTAL", "FREET4", "T3FREE", "THYROIDAB" in the last 72 hours. Anemia Panel: No results for input(s): "VITAMINB12", "FOLATE", "FERRITIN", "TIBC", "IRON", "RETICCTPCT" in the last 72 hours. Urine analysis:    Component Value Date/Time   COLORURINE YELLOW (A) 01/29/2022 1440   APPEARANCEUR HAZY (A) 01/29/2022 1440   APPEARANCEUR Clear 04/13/2015 1425   LABSPEC 1.019 01/29/2022 1440   LABSPEC 1.004 11/15/2013 1046   PHURINE 6.0 01/29/2022 1440   GLUCOSEU NEGATIVE 01/29/2022 1440   GLUCOSEU Negative 11/15/2013 1046   HGBUR SMALL (A) 01/29/2022 1440   BILIRUBINUR NEGATIVE 01/29/2022 1440   BILIRUBINUR Negative 04/13/2015 1425   BILIRUBINUR Negative 11/15/2013 1046   KETONESUR 5 (A) 01/29/2022 1440   PROTEINUR NEGATIVE 01/29/2022 1440   NITRITE NEGATIVE 01/29/2022 1440   LEUKOCYTESUR SMALL (A) 01/29/2022 1440   LEUKOCYTESUR 1+ 11/15/2013 1046   Sepsis Labs: @LABRCNTIP (procalcitonin:4,lacticidven:4) )No results found for this or any previous visit (from the past 240 hour(s)).   Radiological Exams on Admission: DG Knee 1-2 Views Left  Result Date: 08/16/2022 CLINICAL DATA:  Fall. EXAM: LEFT KNEE - 1-2 VIEW COMPARISON:  None Available. FINDINGS: The bones are subjectively under mineralized. Tricompartmental osteoarthritis with peripheral spurring and medial tibiofemoral joint space narrowing. Trace knee joint effusion but no lipohemarthrosis. No bony destructive change. No focal soft  tissue abnormalities. IMPRESSION: 1. No fracture or subluxation of the left knee. 2. Tricompartmental osteoarthritis. Electronically Signed   By: Narda Rutherford M.D.   On: 08/16/2022 17:45   CT Hip Left Wo Contrast  Result Date: 08/16/2022 CLINICAL DATA:  Posttraumatic left hip pain.  Fracture suspected. EXAM: CT OF THE LEFT HIP WITHOUT CONTRAST TECHNIQUE: Multidetector CT imaging of the  left hip was performed according to the standard protocol. Multiplanar CT image reconstructions were also generated. RADIATION DOSE REDUCTION: This exam was performed according to the departmental dose-optimization program which includes automated exposure control, adjustment of the mA and/or kV according to patient size and/or use of iterative reconstruction technique. COMPARISON:  The left hip radiographs same date. Pelvic CT 01/29/2022. FINDINGS: Bones/Joint/Cartilage As seen on the earlier radiographs, there are subacute, healing fractures of the left superior and inferior pubic rami which are new compared with the pelvic CT of 01/29/2022. These fractures appear incompletely healed. There is a fully healed fracture of the left sacral ala which has occurred in the interval as well. No evidence of acute fracture or dislocation. Mild degenerative changes at the left sacroiliac joint. Advanced left hip osteoarthritis with marked joint space narrowing and osteophytes. There is a small left hip joint effusion. Ligaments Suboptimally assessed by CT. Muscles and Tendons No evidence of focal muscular atrophy or hematoma. Soft tissues No evidence of periarticular hematoma, foreign body or soft tissue emphysema. IMPRESSION: 1. Subacute, healing fractures of the left superior and inferior pubic rami and left sacral ala. 2. No evidence of acute fracture or dislocation. 3. Advanced left hip osteoarthritis. Electronically Signed   By: Carey Bullocks M.D.   On: 08/16/2022 16:45   CT HEAD WO CONTRAST ( )  Result Date:  08/16/2022 CLINICAL DATA:  Head trauma, minor (Age >= 65y) EXAM: CT HEAD WITHOUT CONTRAST TECHNIQUE: Contiguous axial images were obtained from the base of the skull through the vertex without intravenous contrast. RADIATION DOSE REDUCTION: This exam was performed according to the departmental dose-optimization program which includes automated exposure control, adjustment of the mA and/or kV according to patient size and/or use of iterative reconstruction technique. COMPARISON:  CT scan head from 07/18/2021. FINDINGS: Brain: No evidence of acute infarction, hemorrhage, hydrocephalus, extra-axial collection or mass lesion/mass effect. Vascular: No hyperdense vessel or unexpected calcification. Skull: Normal. Negative for fracture or focal lesion. Sinuses/Orbits: No acute finding. Other: Soft tissue attenuation areas noted in the right external auditory canal, likely cerumen. IMPRESSION: 1. No acute intracranial abnormality. No calvarial fracture. Electronically Signed   By: Jules Schick M.D.   On: 08/16/2022 14:58   DG Hip Unilat W or Wo Pelvis 2-3 Views Left  Result Date: 08/16/2022 CLINICAL DATA:  fall,left hip pain EXAM: DG HIP (WITH OR WITHOUT PELVIS) 2-3V LEFT COMPARISON:  Lumbar spine radiograph from 01/29/2022. FINDINGS: Normal and symmetric bilateral sacroiliac joints. No acute fracture. Note is made of the pubic diastasis. Subacute/healing fracture of the left superior and inferior pubic rami noted. Mild degenerative changes of right hip joint and moderate-to-severe degenerative changes of left hip joint. No radiopaque foreign body. Soft tissues are within normal limits. IMPRESSION: 1. Subacute/healing fracture of the left superior and inferior pubic rami. 2. Moderate-to-severe degenerative changes of the left hip joint. Electronically Signed   By: Jules Schick M.D.   On: 08/16/2022 14:52      Assessment/Plan Principal Problem:   Syncope Active Problems:   Fall at home, initial encounter    Pubic ramus fracture, left, sequela   Left hip pain   Hypothyroidism   Iron deficiency anemia   Diarrhea   Lymphedema of right arm   Schizophrenia (HCC)   Anxiety and depression   Bipolar 1 disorder (HCC)   Primary osteoarthritis of left hip   Contusion of left hip   Assessment and Plan:   Syncope: Etiology is not clear, possibly due to volume depletion  secondary to diarrhea.  Patient does not have focal neurodeficit on physical examination.  CT head negative.  No oxygen desaturation, low suspicions for PE.  -Placed on telemetry bed of observation -Fall precaution -Frequent neurocheck -Check orthostatic vital sign -IV fluid: 75 cc/h of normal saline  Fall at home, initial encounter -Fall precaution -PT/OT when able to  Pubic bone fracture (HCC) and Left hip pain: CT scan of left hip is negative for fracture.  Consulted Dr. Audelia Acton of Ortho  - Pain control: prn morphine, percocet and tyleno - When necessary Zofran for nausea - Robaxin for muscle spasm - Lidoderm patch for pain - PT/OT when able to (not ordered now)  Hypothyroidism -Synthroid  Iron deficiency anemia: Hemoglobin stable, 10.5 -Continue iron supplement  Diarrhea: Patient reports diarrhea in the past 4 days, but resolved today. -Initially ordered C. difficile test which is discontinued since patient does not have diarrhea currently  Lymphedema of right arm -Observe closely  Schizophrenia (HCC), Anxiety and depression,  Bipolar 1 disorder (HCC): Patient is calm now -As needed Ativan for agitation -Seroquel, Zoloft  History of breast cancer: S/p of right mastectomy -Letrozole      DVT ppx: SCD  Code Status: DNR per patient, and her son and daughter-in-law  Family Communication:   Yes, patient's son and daughter-in law  at bed side.   Disposition Plan:  Anticipate discharge back to previous environment  Consults called:  Dr. Audelia Acton of Ortho is consulted.  Admission status and Level of  care: Telemetry Medical:    Med-surg bed for obs as inpt    progressive unit for obs   as inpt      SDU/inpation         Dispo: The patient is from: Home               Anticipated d/c is to: Home              Anticipated d/c date is: 1 day              Patient currently is not medically stable to d/c.    Severity of Illness:  The appropriate patient status for this patient is OBSERVATION. Observation status is judged to be reasonable and necessary in order to provide the required intensity of service to ensure the patient's safety. The patient's presenting symptoms, physical exam findings, and initial radiographic and laboratory data in the context of their medical condition is felt to place them at decreased risk for further clinical deterioration. Furthermore, it is anticipated that the patient will be medically stable for discharge from the hospital within 2 midnights of admission.        Date of Service 08/16/2022    Lorretta Harp [facility]   If 7PM-7AM, please contact night-coverage [email or web address] 08/16/2022, 6:34 PM

## 2022-08-16 NOTE — Plan of Care (Signed)

## 2022-08-16 NOTE — ED Notes (Signed)
Pt returned from CT °

## 2022-08-16 NOTE — ED Notes (Signed)
Pt given warm blankets at this time 

## 2022-08-16 NOTE — ED Notes (Signed)
Pt in CT.

## 2022-08-16 NOTE — Consult Note (Signed)
ORTHOPAEDIC CONSULTATION  REQUESTING PHYSICIAN: Lorretta Harp, MD  Chief Complaint:   Left hip pain  History of Present Illness: Elizabeth [name] is a 72 y.o. female who presented to the emergency room today after a fall with questionable syncope versus head strike.  Patient reports she fell down this morning and is unsure how she fell and was reportedly on the ground for about an hour.  Patient seen and examined with son and daughter-in-law at bedside who assist with history.  Per family the patient has had at least 3 or 4 falls in the last 2 weeks and was recently diagnosed with a coccyx fracture.  They report that she is fallen in different ways each time sometimes from not lifting her foot high enough sometimes from appearing to just lose her balance and fall over.  They report that the patient is having memory issues and some dementia which is affecting her ability to independently function and walk.  She normally uses a rolling walker for ambulation.  She has had some pre-existing left hip pain with known arthritic changes to the hip.  At this time she denies any headache, vision changes, shortness of breath, chest pain, palpitations, nausea or vomiting.  She does report a little bit of decrease sensation in her bilateral lower extremities which is nondescript and has been going on for at least a week per the patient.  However her history of this changes throughout the encounter.  Past Medical History:  Diagnosis Date   Acute cholecystitis 2016   Anemia    Anemia 2016   Anxiety    panic attacks   Anxiety and depression    Bipolar 1 disorder (HCC)    Bowel obstruction (HCC) 2017   Common bile duct dilatation 2015   Dyspnea    Edema    Fibromyalgia    GI bleed 2017   History of kidney stones 01/21/2009   HSV-1 infection    Hyperglycemia    Hyperlipidemia    Hypokalemia    Hypothyroidism    Liver cyst    Malignant  neoplasm of right female breast, unspecified estrogen receptor status, unspecified site of breast (HCC)    Memory loss    Osteopenia    Panic disorder    Schizophrenia (HCC)    Ventral hernia    Past Surgical History:  Procedure Laterality Date   ABDOMINAL HYSTERECTOMY     BREAST BIOPSY Right 01/19/2021   [place] Bx, ribbon, path pending   BREAST BIOPSY Right 01/19/2021   [place] Bx, Venus, path pending   BREAST BIOPSY Right 01/19/2021   [place] Bx, heart, path pending   BREAST BIOPSY Right 01/19/2021   [place] BX Axilla, Coil, path pending   CESAREAN SECTION     CHOLECYSTECTOMY OPEN  11/17/2013   with repair of bile duct-Dr. Egbert Garibaldi   MASTECTOMY MODIFIED RADICAL Right 03/02/2021   Procedure: MASTECTOMY MODIFIED RADICAL;  Surgeon: Sung Amabile, DO;  Location: [facility];  Service: General;  Laterality: Right;   OPEN REDUCTION INTERNAL FIXATION (ORIF) DISTAL RADIAL FRACTURE Right 12/20/2014   Procedure: OPEN REDUCTION INTERNAL FIXATION (ORIF) RIGHT DISTAL RADIAL FRACTURE;  Surgeon: Sheral Apley, MD;  Location: [facility];  Service: Orthopedics;  Laterality: Right;   STOMACH SURGERY     Social History   Socioeconomic History   Marital status: Married    Spouse name: Not on file   Number of children: 1   Years of education: Not on file   Highest education level:  Not on file  Occupational History   Not on file  Tobacco Use   Smoking status: Former    Current packs/day: 0.00    Types: Cigarettes    Quit date: 11/03/1973    Years since quitting: 48.8    Passive exposure: Past   Smokeless tobacco: Never   Tobacco comments:    Smoked briefly as a teen- lives in secondhand smoke.   Vaping Use   Vaping status: Never Used  Substance and Sexual Activity   Alcohol use: No    Alcohol/week: 0.0 standard drinks of alcohol   Drug use: No   Sexual activity: Not Currently  Other Topics Concern   Not on file  Social History Narrative   Not on file   Social Determinants of Health    Financial Resource Strain: High Risk (07/31/2021)   Received from [facility], [facility]   Overall Financial Resource Strain (CARDIA)    Difficulty of Paying Living Expenses: Hard  Food Insecurity: No Food Insecurity (08/16/2022)   Hunger Vital Sign    Worried About Running Out of Food in the Last Year: Never true    Ran Out of Food in the Last Year: Never true  Transportation Needs: No Transportation Needs (08/16/2022)   PRAPARE - Administrator, Civil Service (Medical): No    Lack of Transportation (Non-Medical): No  Physical Activity: Insufficiently Active (08/06/2021)   Received from [facility], [facility]   Exercise Vital Sign    Days of Exercise per Week: 2 days    Minutes of Exercise per Session: 30 min  Stress: No Stress Concern Present (08/27/2021)   Received from [facility], [facility]   [employer] of Occupational Health - Occupational Stress Questionnaire    Feeling of Stress : Only a little  Social Connections: Socially Isolated (07/31/2021)   Received from [facility], [facility]   Social Connection and Isolation Panel [NHANES]    Frequency of Communication with Friends and Family: More than three times a week    Frequency of Social Gatherings with Friends and Family: Three times a week    Attends Religious Services: Never    Active Member of Clubs or Organizations: No    Attends Banker Meetings: Never    Marital Status: Widowed   Family History  Problem Relation Age of Onset   Heart disease Father    Breast cancer Paternal Aunt    Kidney disease Paternal Aunt    Breast cancer Paternal Grandmother    Asthma Daughter    Bladder Cancer Neg Hx    Allergies  Allergen Reactions   Penicillins Hives    TOLERATED CEFAZOLIN   Prior to Admission medications   Medication Sig Start Date End  Date Taking? Authorizing Provider  alendronate (FOSAMAX) 70 MG tablet Take 1 tablet by mouth every Monday. 03/09/20  Yes [provider]  ferrous sulfate 325 (65 FE) MG tablet Take 1 tablet by mouth daily with breakfast. 08/15/21  Yes [provider]  letrozole (FEMARA) 2.5 MG tablet Take 1 tablet (2.5 mg total) by mouth daily. 07/19/21  Yes Arnaldo Natal, MD  levothyroxine (SYNTHROID, LEVOTHROID) 88 MCG tablet Take 88 mcg by mouth daily before breakfast.   Yes [provider]  QUEtiapine (SEROQUEL) 25 MG tablet Take 1 tablet (25 mg total) by mouth at bedtime. 08/28/22 11/26/22 Yes Neysa Hotter, MD  sertraline (ZOLOFT) 50 MG tablet Take 1 tablet (50 mg total) by mouth daily. 08/24/22 11/22/22 Yes Hisada, Barbee Cough, MD  silver sulfADIAZINE (SILVADENE) 1 % cream Apply 1 Application topically 2 (two) times daily. 03/25/22  Yes Chrystal, Sherrine Maples, MD  Vitamin D, Ergocalciferol, (DRISDOL) 1.25 MG (50000 UNIT) CAPS capsule Take 50,000 Units by mouth once a week. 07/02/21  Yes [provider]   CT Hip Left Wo Contrast  Result Date: 08/16/2022 CLINICAL DATA:  Posttraumatic left hip pain.  Fracture suspected. EXAM: CT OF THE LEFT HIP WITHOUT CONTRAST TECHNIQUE: Multidetector CT imaging of the left hip was performed according to the standard protocol. Multiplanar CT image reconstructions were also generated. RADIATION DOSE REDUCTION: This exam was performed according to the departmental dose-optimization program which includes automated exposure control, adjustment of the mA and/or kV according to patient size and/or use of iterative reconstruction technique. COMPARISON:  The left hip radiographs same date. Pelvic CT 01/29/2022. FINDINGS: Bones/Joint/Cartilage As seen on the earlier radiographs, there are subacute, healing fractures of the left superior and inferior pubic rami which are new compared with the pelvic CT of 01/29/2022. These fractures appear incompletely healed. There is a fully  healed fracture of the left sacral ala which has occurred in the interval as well. No evidence of acute fracture or dislocation. Mild degenerative changes at the left sacroiliac joint. Advanced left hip osteoarthritis with marked joint space narrowing and osteophytes. There is a small left hip joint effusion. Ligaments Suboptimally assessed by CT. Muscles and Tendons No evidence of focal muscular atrophy or hematoma. Soft tissues No evidence of periarticular hematoma, foreign body or soft tissue emphysema. IMPRESSION: 1. Subacute, healing fractures of the left superior and inferior pubic rami and left sacral ala. 2. No evidence of acute fracture or dislocation. 3. Advanced left hip osteoarthritis. Electronically Signed   By: Carey Bullocks M.D.   On: 08/16/2022 16:45   CT HEAD WO CONTRAST ( )  Result Date: 08/16/2022 CLINICAL DATA:  Head trauma, minor (Age >= 65y) EXAM: CT HEAD WITHOUT CONTRAST TECHNIQUE: Contiguous axial images were obtained from the base of the skull through the vertex without intravenous contrast. RADIATION DOSE REDUCTION: This exam was performed according to the departmental dose-optimization program which includes automated exposure control, adjustment of the mA and/or kV according to patient size and/or use of iterative reconstruction technique. COMPARISON:  CT scan head from 07/18/2021. FINDINGS: Brain: No evidence of acute infarction, hemorrhage, hydrocephalus, extra-axial collection or mass lesion/mass effect. Vascular: No hyperdense vessel or unexpected calcification. Skull: Normal. Negative for fracture or focal lesion. Sinuses/Orbits: No acute finding. Other: Soft tissue attenuation areas noted in the right external auditory canal, likely cerumen. IMPRESSION: 1. No acute intracranial abnormality. No calvarial fracture. Electronically Signed   By: Jules Schick M.D.   On: 08/16/2022 14:58   DG Hip Unilat W or Wo Pelvis 2-3 Views Left  Result Date: 08/16/2022 CLINICAL DATA:   fall,left hip pain EXAM: DG HIP (WITH OR WITHOUT PELVIS) 2-3V LEFT COMPARISON:  Lumbar spine radiograph from 01/29/2022. FINDINGS: Normal and symmetric bilateral sacroiliac joints. No acute fracture. Note is made of the pubic diastasis. Subacute/healing fracture of the left superior and inferior pubic rami noted. Mild degenerative changes of right hip joint and moderate-to-severe degenerative changes of left hip joint. No radiopaque foreign body. Soft tissues are within normal limits. IMPRESSION: 1. Subacute/healing fracture of the left superior and inferior pubic rami. 2. Moderate-to-severe degenerative changes of the left hip joint. Electronically Signed   By: Rhea Belton  Ramiro Harvest M.D.   On: 08/16/2022 14:52    Positive ROS: All other systems have been reviewed and were otherwise negative with the exception of those mentioned in the HPI and as above.  Physical Exam: General:  Alert, no acute distress Psychiatric: Patient is alert and oriented to the fact that she is in the hospital and self Cardiovascular:  No pedal edema Respiratory:  No wheezing, non-labored breathing GI:  Abdomen is soft and non-tender Skin:  No lesions in the area of chief complaint Neurologic:  Sensation intact distally Lymphatic:  No axillary or cervical lymphadenopathy  Orthopedic Exam:  Left lower extremity Skin intact with tenderness palpation over the lateral hip Minimal to no pain with micromotion and logroll Minimal tenderness over the lateral aspect of the left knee able to fully range the knee with minimal pain No pain with distracted, axial load of the left lower extremity Neurovascular intact distally with intact dorsalis pedis pulse and sensation to the toes able to dorsiflex Compartments all soft  Secondary survey No tenderness to palpation over other bony prominences in the lower extremities or bilateral upper extremities No pain with logroll or simulated axial loading of the right lower extremity All  compartments soft No tenderness to palpation over the cervical or thoracic spine, no bony step-off Motor grossly intact throughout, no focal deficits Sensation grossly intact throughout, no focal deficits Good distal pulses and capillary refill on all extremities    X-rays:  X-ray of the left hip and pelvis and CT scan of the left hip images and report reviewed by myself.  There is severe degenerative changes to the left hip with osteophyte formation, sclerosis, subchondral cyst formation, and femoral head deformity.  There is no evidence of any acute fractures of the left hip.  There is evidence of a subacute to chronic appearing pubic ramus fracture on the left superior and inferior with bony overgrowth consistent with a more chronic process. AP and lateral x-rays of the left knee images reviewed by myself show no evidence of any fracture or dislocation of the knee moderate degenerative changes with osteophyte formation and sclerosis.  Assessment: Left hip contusion, left hip osteoarthritis, chronic left pubic ramus fracture  Plan: Reviewed the radiographic and clinical findings with the patient and her son.  At this time I see no evidence of any displaced fractures and her exam is reassuring for no fracture given no pain with axial load.  I recommend treatment with anti-inflammatories and we will order her a few doses of Toradol as she has good kidney function on labs today.  She may weight-bear as tolerated with physical therapy and from an orthopedic standpoint is stable.  Will plan to follow her on an outpatient basis and discuss future treatment options for her left hip including possible outpatient hip injections and discussion for elective surgery.  All questions answered with the patient and her son and they agree with the above plan.  No plan for any orthopedic intervention at this time.    Reinaldo Berber MD  Beeper #:  [phone]  08/16/2022 5:30 PM

## 2022-08-16 NOTE — ED Provider Notes (Signed)
[facility] Provider Note    Event Date/Time   First MD Initiated Contact with Patient 08/16/22 1317     (approximate)   History   Fall and Loss of Consciousness   HPI  Elizabeth [name] is a 72 y.o. female presents the ER for evaluation of fall with left hip pain.  Occurred this morning she lives at home alone.  Reportedly was on ground for an hour.  Patient amnestic to the fall.  Denies any new numbness or tingling.     Physical Exam   Triage Vital Signs: ED Triage Vitals  Encounter Vitals Group     BP 08/16/22 1315 108/71     Systolic BP Percentile --      Diastolic BP Percentile --      Pulse Rate 08/16/22 1315 89     Resp 08/16/22 1315 18     Temp 08/16/22 1318 99.9 F (37.7 C)     Temp Source 08/16/22 1318 Oral     SpO2 08/16/22 1315 97 %     Weight 08/16/22 1314 112 lb 3.4 oz (50.9 kg)     Height 08/16/22 1314 5' (1.524 m)     Head Circumference --      Peak Flow --      Pain Score 08/16/22 1314 5     Pain Loc --      Pain Education --      Exclude from Growth Chart --     Most recent vital signs: Vitals:   08/16/22 1440 08/16/22 1500  BP: 126/67 115/67  Pulse: 72 66  Resp: 17 18  Temp:    SpO2: 100% 100%     Constitutional: Alert  Eyes: Conjunctivae are normal.  Head: Atraumatic. Nose: No congestion/rhinnorhea. Mouth/Throat: Mucous membranes are moist.   Neck: Painless ROM.  Cardiovascular:   Good peripheral circulation. Respiratory: Normal respiratory effort.  No retractions.  Gastrointestinal: Soft and nontender.  Musculoskeletal: Left lower extremity is shortened and externally rotated with pain with logroll of left hip. Neurologic:  MAE spontaneously. No gross focal neurologic deficits are appreciated.  Skin:  Skin is warm, dry and intact. No rash noted. Psychiatric: Mood and affect are normal. Speech and behavior are normal.    ED Results / Procedures / Treatments   Labs (all labs ordered are listed, but  only abnormal results are displayed) Labs Reviewed  CBC WITH DIFFERENTIAL/PLATELET - Abnormal; Notable for the following components:      Result Value   RBC 3.40 (*)    Hemoglobin 10.5 (*)    HCT 32.1 (*)    All other components within normal limits  COMPREHENSIVE METABOLIC PANEL - Abnormal; Notable for the following components:   Glucose, Bld 103 (*)    Calcium 8.5 (*)    Albumin 3.0 (*)    All other components within normal limits  URINALYSIS, ROUTINE W REFLEX MICROSCOPIC  TROPONIN I (HIGH SENSITIVITY)     EKG  ED ECG REPORT I, Willy Eddy, the attending physician, personally viewed and interpreted this ECG.   Date: 08/16/2022  EKG Time: 13;26  Rate: 80  Rhythm: sinus  Axis: normal  Intervals:normal  ST&T Change: nonspecific st abn, no stemi    RADIOLOGY Please see ED Course for my review and interpretation.  I personally reviewed all radiographic images ordered to evaluate for the above acute complaints and reviewed radiology reports and findings.  These findings were personally discussed with the patient.  Please see medical record  for radiology report.    PROCEDURES:  Critical Care performed: No  Procedures   MEDICATIONS ORDERED IN ED: Medications - No data to display   IMPRESSION / MDM / ASSESSMENT AND PLAN / ED COURSE  I reviewed the triage vital signs and the nursing notes.                              Differential diagnosis includes, but is not limited to, dysrhythmia, SDH, IPH, fracture, dislocation, anemia, sepsis  Patient presenting to the ER for evaluation of symptoms as described above.  Based on symptoms, risk factors and considered above differential, this presenting complaint could reflect a potentially life-threatening illness therefore the patient will be placed on continuous pulse oximetry and telemetry for monitoring.  Laboratory evaluation will be sent to evaluate for the above complaints.      Clinical Course as of 08/16/22  1526  Fri Aug 16, 2022  1420 X-ray my review and interpretation consistent with left femoral neck fracture.  Will consult Ortho. [PR]  1524 Discussed case consultation with Ortho has recommended further evaluation with CT imaging.  Given her syncopal episode with prolonged downtime age and risk factors will consult hospitalist for admission.  Patient and family agreeable to plan. [PR]    Clinical Course User Index [PR] Willy Eddy, MD     FINAL CLINICAL IMPRESSION(S) / ED DIAGNOSES   Final diagnoses:  Syncope and collapse  Left hip pain     Rx / DC Orders   ED Discharge Orders     None        Note:  This document was prepared using Dragon voice recognition software and may include unintentional dictation errors.    Willy Eddy, MD 08/16/22 [phone]

## 2022-08-16 NOTE — ED Triage Notes (Signed)
Pt here with a fall after a syncopal episode this morning. Pt c/o pain on her left leg and and her chin. Pt was going to the bathroom and tripped, and hit her head. Family states pt was unconscious for about an hour. Pt has lymphedema in her right arm.

## 2022-08-17 DIAGNOSIS — M25552 Pain in left hip: Secondary | ICD-10-CM | POA: Diagnosis not present

## 2022-08-17 DIAGNOSIS — R55 Syncope and collapse: Secondary | ICD-10-CM | POA: Diagnosis not present

## 2022-08-17 LAB — URINE DRUG SCREEN, QUALITATIVE (ARMC ONLY)
Amphetamines, Ur Screen: NOT DETECTED
Barbiturates, Ur Screen: NOT DETECTED
Benzodiazepine, Ur Scrn: NOT DETECTED
Cannabinoid 50 Ng, Ur ~~LOC~~: NOT DETECTED
Cocaine Metabolite,Ur ~~LOC~~: NOT DETECTED
MDMA (Ecstasy)Ur Screen: NOT DETECTED
Methadone Scn, Ur: NOT DETECTED
Opiate, Ur Screen: NOT DETECTED
Phencyclidine (PCP) Ur S: NOT DETECTED
Tricyclic, Ur Screen: NOT DETECTED

## 2022-08-17 LAB — URINALYSIS, ROUTINE W REFLEX MICROSCOPIC
Bacteria, UA: NONE SEEN
Bilirubin Urine: NEGATIVE
Glucose, UA: NEGATIVE mg/dL
Ketones, ur: NEGATIVE mg/dL
Nitrite: NEGATIVE
Protein, ur: NEGATIVE mg/dL
Specific Gravity, Urine: 1.002 — ABNORMAL LOW (ref 1.005–1.030)
Squamous Epithelial / HPF: NONE SEEN /HPF (ref 0–5)
pH: 6 (ref 5.0–8.0)

## 2022-08-17 NOTE — Hospital Course (Signed)
HPI on admission 08/17/22 per Dr. Clyde Lundborg: "Elizabeth [name] is a 72 y.o. female with medical history significant of pre-DM, hypothyroidism, GI bleeding, bipolar, schizophrenia, panic disorder, depression with anxiety, anemia, fibromyalgia, breast cancer (s/p for right mastectomy), right lymphedema, who presents with syncope and fall, diarrhea.   Patient states that she has diarrhea for more than 4 days, about 4 times of watery diarrhea each day.  Denies nausea, vomiting or abdominal pain.  Per her daughter-in-law at the bedside, pt passed out and fell in the bathroom in at about 7:15 AM by home camera recorder. Pt was on the floor for about an hour. She hit her head, but no headache or neck pain. She injured her left hip, causing left hip pain, which is constant, sharp, severe, nonradiating, associated with muscle spasm in left leg.  She also reports pain in her tailbone and left knee. Denies chest pain, cough, fever or chills.  Patient has mild shortness of breath.  No symptoms of UTI."   ED evaluation -- stable vitals, temp 99.9 F. CT head negative. Left knee x-ray negative Left hip/pelvic x-ray - subacute / healing left fracture of superior and inferior pubic rami CT Left Hip -- confirmed above. Orthopedic surgery was consulted. Recommended WBAT on left leg, anti-inflammatories.  PT.   Stable from Ortho's standpoint. Outpatient follow up, possible injections for hip arthritis.  Patient admitted for evaluation of Syncope felt due to hypovolemia, volume depletion related to diarrhea.

## 2022-08-17 NOTE — Assessment & Plan Note (Signed)
See bipolar disorder 

## 2022-08-17 NOTE — Assessment & Plan Note (Signed)
On home meds - Seroquel, Zoloft Delirium precautions

## 2022-08-17 NOTE — Plan of Care (Signed)

## 2022-08-17 NOTE — Assessment & Plan Note (Signed)
-   Stable  - Continue iron supplement

## 2022-08-17 NOTE — Assessment & Plan Note (Signed)
Orthopedic follow up Activity and weight-bearing as tolerated PT evaluation - SNF recommended Fall precautions

## 2022-08-17 NOTE — Progress Notes (Signed)
Progress Note   Patient: Elizabeth [name] ZOX:[id number] DOB: 1950-05-07 DOA: 08/16/2022     0 DOS: the patient was seen and examined on 08/17/2022   Brief hospital course: HPI on admission 08/17/22 per Dr. Clyde Lundborg: "Elizabeth [name] is a 72 y.o. female with medical history significant of pre-DM, hypothyroidism, GI bleeding, bipolar, schizophrenia, panic disorder, depression with anxiety, anemia, fibromyalgia, breast cancer (s/p for right mastectomy), right lymphedema, who presents with syncope and fall, diarrhea.   Patient states that she has diarrhea for more than 4 days, about 4 times of watery diarrhea each day.  Denies nausea, vomiting or abdominal pain.  Per her daughter-in-law at the bedside, pt passed out and fell in the bathroom in at about 7:15 AM by home camera recorder. Pt was on the floor for about an hour. She hit her head, but no headache or neck pain. She injured her left hip, causing left hip pain, which is constant, sharp, severe, nonradiating, associated with muscle spasm in left leg.  She also reports pain in her tailbone and left knee. Denies chest pain, cough, fever or chills.  Patient has mild shortness of breath.  No symptoms of UTI."   ED evaluation -- stable vitals, temp 99.9 F. CT head negative. Left knee x-ray negative Left hip/pelvic x-ray - subacute / healing left fracture of superior and inferior pubic rami CT Left Hip -- confirmed above. Orthopedic surgery was consulted. Recommended WBAT on left leg, anti-inflammatories.  PT.   Stable from Ortho's standpoint. Outpatient follow up, possible injections for hip arthritis.  Patient admitted for evaluation of Syncope felt due to hypovolemia, volume depletion related to diarrhea.   Assessment and Plan:  * Syncope Suspect due to volume depletion given 4 days diarrhea at home, and poor PO intake.  CT head negative.  Stable vitals, no hypoxia, no dyspnea, unlikely PE. BP's are soft and orthostatic vitals weakly  positive. --Continue IV fluids --Check Echo given soft BP's and no prior available echo --daily orthostatic vitals --Telemetry --Fall precautions  Fall at home, initial encounter PT evaluation Fall precautions  Left hip pain Subacute Left Pubic Fracture CT scan of left hip is negative for fracture Orthopedic surgery consulted --PT/OT --Anti-inflammatories per orders --Ortho recommends WBAT on left leg, outpatient follow up for possible hip injection --Pain control -- PRN Tylenol, Percocet, morphine, Robaxin --Lidocaine patch   - Pain control: prn morphine, percocet and tyleno - When necessary Zofran for nausea - Robaxin for muscle spasm - Lidoderm patch for pain - PT/OT when able to (not ordered now)  Pubic ramus fracture, left, sequela .  Hypothyroidism Synthroid  Iron deficiency anemia Stable.  Continue iron supplement  Diarrhea None since admission.  4 days frequent diarrhea at home PTA. --C diff test cancelled --Monitor  --IV fluids for volume repletion, BP's are soft  Lymphedema of right arm S/p right mastectomy for breast cancer. Monitor.  Bipolar 1 disorder (HCC) Overall stable.  Per family has intermittent paranoia and delusion, anxiety episodes. --PRN Ativan for agitation --Continue home Seroquel, Zoloft  Anxiety and depression See bipolar disorder  Schizophrenia (HCC) On home meds - Seroquel, Zoloft Delirium precautions  Contusion of left hip Orthopedic follow up Activity and weight-bearing as tolerated PT evaluation - SNF recommended Fall precautions  Primary osteoarthritis of left hip See left hip pain        Subjective: Pt awake resting in bed this AM.  She reports feeling fine and wanting to go home.  States her caregiver coming at  2pm and will need d/c orders by then.  No family present on AM rounds.  No diarrhea.  She denies any acute complaints.  Son and daughter visiting this afternoon.  They report pt has long hx of chronic  intermittent diarrhea.  She has caregivers 41 hrs per week, but does live alone, goes to an day care program, alone overnight.     Physical Exam: Vitals:   08/17/22 1145 08/17/22 1147 08/17/22 1158 08/17/22 1530  BP: 115/64 103/67 (!) 104/56 (!) 109/56  Pulse: 89 82 99 79  Resp: 18   18  Temp:    98.6 F (37 C)  TempSrc:      SpO2: 98% 98% 100% 99%  Weight:      Height:       General exam: awake, alert, no acute distress, frail HEENT: atraumatic, moist mucus membranes, hearing grossly normal  Respiratory system: CTAB, no wheezes, rales or rhonchi, normal respiratory effort. Cardiovascular system: normal S1/S2, RRR, no JVD, murmurs, rubs, gallops, no pedal edema.   Gastrointestinal system: soft, NT, ND, no HSM felt, +bowel sounds. Central nervous system: A&O x self and hospital but would not further answer orientation questions. no gross focal neurologic deficits, normal speech Extremities: moves all, no edema, normal tone Skin: dry, intact, normal temperature Psychiatry: anxious mood, congruent affect, judgement and insight appear abnormal, no apparent hallucinations   Data Reviewed:  Notable labs -- normal BMP except Ca 7.9.  Hbg 9.1 from 10.5, WBC 3.6 UA no infection UDS negative  Family Communication: son and daughter-in-law at bedside this afternoon  Disposition: Status is: Observation The patient remains OBS appropriate and will d/c before 2 midnights. Remains admitted for further IV hydration due to hypotension. Safe dispo planning underway, SNF recommended.  Vs [facility] support   Planned Discharge Destination: Home with Home Health    Time spent: 46 minutes  Author: Pennie Banter, DO 08/17/2022 4:45 PM  For on call review [email or web address].

## 2022-08-17 NOTE — Assessment & Plan Note (Signed)
Subacute Left Pubic Fracture CT scan of left hip is negative for fracture Orthopedic surgery consulted --PT/OT --Anti-inflammatories per orders --Ortho recommends WBAT on left leg, outpatient follow up for possible hip injection --Pain control -- PRN Tylenol, Percocet, morphine, Robaxin --Lidocaine patch   - Pain control: prn morphine, percocet and tyleno - When necessary Zofran for nausea - Robaxin for muscle spasm - Lidoderm patch for pain - PT/OT when able to (not ordered now)

## 2022-08-17 NOTE — Assessment & Plan Note (Signed)
Overall stable.  Per family has intermittent paranoia and delusion, anxiety episodes. --PRN Ativan for agitation --Continue home Seroquel, Zoloft  ?Suicidal ideations - see PT note for comments made by pt on 7/27 --Psychiatry consulted

## 2022-08-17 NOTE — Assessment & Plan Note (Signed)
Synthroid 

## 2022-08-17 NOTE — Assessment & Plan Note (Addendum)
Suspect due to volume depletion given 4 days diarrhea at home, and poor PO intake.  CT head negative.  Stable vitals, no hypoxia, no dyspnea, unlikely PE. BP's are soft and orthostatic vitals weakly positive. --Continue IV fluids --Check Echo given soft BP's and no prior available echo --daily orthostatic vitals --Telemetry --Fall precautions

## 2022-08-17 NOTE — Assessment & Plan Note (Signed)
S/p right mastectomy for breast cancer. Monitor.

## 2022-08-17 NOTE — Assessment & Plan Note (Signed)
See left hip pain.

## 2022-08-17 NOTE — Assessment & Plan Note (Addendum)
None since admission.  4 days frequent diarrhea at home PTA. --C diff test cancelled --Monitor  --IV fluids for volume repletion, BP's are soft

## 2022-08-17 NOTE — Evaluation (Signed)
Physical Therapy Evaluation Patient Details Name: Elizabeth [name] MRN: [id number] DOB: December 11, 1950 Today's Date: 08/17/2022  History of Present Illness  Elizabeth [name] is a 72 y.o. female with medical history significant of pre-DM, hypothyroidism, GI bleeding, bipolar, schizophrenia, panic disorder, depression with anxiety, anemia, fibromyalgia, breast cancer (s/p for right mastectomy), right lymphedema, who presents with syncope and fall, diarrhea.     Patient states that she has diarrhea for more than 4 days, about 4 times of watery diarrhea each day.  Denies nausea, vomiting or abdominal pain.  Per her daughter-in-law at the bedside, pt passed out and fell in the bathroom in at about 7:15 AM by home camera recorder. 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  Clinical Impression  72 yo Female was brought to hospital after fall. Patient reports 4 falls in last few months. She lives at home alone. Her son/daughter in law live in [place] but do monitor patient on cameras. Patient has a home care aide. She originally states that the home aide comes to her house 2x a week. However RN confirmed with daughter-in law that home aide is at patient's house daily, approximately 41 hours a week. Home aide is at patient's house every day from 4-6PM but can increase duration if needed. Patient also goes to adult day care 2x a week from 9-3PM.  Patient was agitated throughout PT evaluation, anxious to go home. PT concerned as patient expressed some psychiatric concerning statements including, "I can't afford this shit. I can't keep coming to the hospital. I need to go to jail, they can do everything for me there. I feel like death warmed up. I can't walk at all. I am at the cemetary, I mean I am just a skeleton and you can't fix a skeleton can you." Patient has had frequent falls. While pt is anxious to return home, PT expressed concerns to RN/MD regarding emotional  statements and high fall risk, unsure of caregiver support.   MD states that she is aware and this is patient's baseline. She is mod I for bed mobility. Patient transfers sit to stand with RW, CGA (safety) and ambulated 75 feet with RW with CGA for safety. When walking she does exhibit increased left toe walk due to pain and exhibits narrow base of support. Patient would benefit from additional skilled PT intervention to improve strength, balance and gait safety;       Assistance Recommended at Discharge Frequent or constant Supervision/Assistance  If plan is discharge home, recommend the following:  Can travel by private vehicle  A little help with walking and/or transfers;A little help with bathing/dressing/bathroom;Assistance with cooking/housework;Direct supervision/assist for financial management;Direct supervision/assist for medications management;Help with stairs or ramp for entrance        Equipment Recommendations None recommended by PT  Recommendations for Other Services       Functional Status Assessment Patient has had a recent decline in their functional status and demonstrates the ability to make significant improvements in function in a reasonable and predictable amount of time.     Precautions / Restrictions Precautions Precautions: Fall Restrictions Weight Bearing Restrictions: No      Mobility  Bed Mobility Overal bed mobility: Modified Independent             General bed mobility comments: pt is impulsive and was sitting edge of bed when PT started evaluation    Transfers Overall transfer level: Needs assistance Equipment used: Rolling walker (2 wheels) Transfers:  Sit to/from Stand Sit to Stand: Min guard           General transfer comment: pt impulsive, she was anxious to stand up and had a hard time waiting until PT had IV line/telemetry positioned for safety    Ambulation/Gait Ambulation/Gait assistance: Min guard Gait Distance (Feet): 75  Feet Assistive device: Rolling walker (2 wheels) Gait Pattern/deviations: Trunk flexed Gait velocity: WFL, can be a little quick as patient is impulsive and anxious to be done walking so she can go home     General Gait Details: ambulates with narrow base of support, increased left toe walking; reports increased pain to walk with heel toe pattern;  Stairs            Wheelchair Mobility     Tilt Bed    Modified Rankin (Stroke Patients Only)       Balance Overall balance assessment: Needs assistance Sitting-balance support: Feet supported Sitting balance-Leahy Scale: Fair     Standing balance support: Bilateral upper extremity supported, During functional activity (when walking) Standing balance-Leahy Scale: Fair Standing balance comment: exhibits impulsive behavior, quick to stand up and walk as she is anxious to return home                             Pertinent Vitals/Pain Pain Assessment Pain Assessment: 0-10 Pain Location: left hip; does not rate pain Pain Descriptors / Indicators: Sore Pain Intervention(s): Limited activity within patient's tolerance, Monitored during session, Repositioned    Home Living Family/patient expects to be discharged to:: Private residence Living Arrangements: Alone;Other (Comment) Available Help at Discharge: Personal care attendant;Other (Comment) (has home aide daily, per daughter in law, they are there 41 hours a week, goes to adult day care 2 days a week from 9-3; daughter in law states they can ask up to 16 hours of home aide daily if needed;) Type of Home: House Home Access: Stairs to enter Entrance Stairs-Rails: Right;Left Entrance Stairs-Number of Steps: 13   Home Layout: Two level;Able to live on main level with bedroom/bathroom Home Equipment: Rollator (4 wheels)      Prior Function Prior Level of Function : Needs assist  Cognitive Assist : Mobility (cognitive);ADLs (cognitive)     Physical Assist :  Mobility (physical);ADLs (physical)     Mobility Comments: has rollator, unsure how often she uses; has home aide that assists with household chores and meal prep ADLs Comments: pt reports home aide also helps with bathing     Hand Dominance   Dominant Hand: Right    Extremity/Trunk Assessment   Upper Extremity Assessment Upper Extremity Assessment: Overall WFL for tasks assessed    Lower Extremity Assessment Lower Extremity Assessment: Generalized weakness    Cervical / Trunk Assessment Cervical / Trunk Assessment: Kyphotic  Communication   Communication: No difficulties  Cognition Arousal/Alertness: Awake/alert Behavior During Therapy: Agitated, Anxious Overall Cognitive Status: Difficult to assess                                 General Comments: pt expresses multiple times throughout session that she feels like death, wishes she could go to jail, etc. she is focused on wanting to go home and has difficulty talking about anything else.        General Comments      Exercises     Assessment/Plan    PT Assessment Patient needs  continued PT services  PT Problem List Decreased strength;Pain;Decreased activity tolerance;Decreased balance;Decreased safety awareness;Decreased mobility       PT Treatment Interventions Therapeutic exercise;Gait training;Balance training;Stair training;Functional mobility training;Therapeutic activities;Patient/family education    PT Goals (Current goals can be found in the Care Plan section)  Acute Rehab PT Goals Patient Stated Goal: to go home PT Goal Formulation: With patient Time For Goal Achievement: 08/31/22 Potential to Achieve Goals: Fair    Frequency Min 1X/week     Co-evaluation               AM-PAC PT "6 Clicks" Mobility  Outcome Measure Help needed turning from your back to your side while in a flat bed without using bedrails?: None Help needed moving from lying on your back to sitting on the  side of a flat bed without using bedrails?: None Help needed moving to and from a bed to a chair (including a wheelchair)?: A Little Help needed standing up from a chair using your arms (e.g., wheelchair or bedside chair)?: A Little Help needed to walk in hospital room?: A Little Help needed climbing 3-5 steps with a railing? : A Lot 6 Click Score: 19    End of Session Equipment Utilized During Treatment: Gait belt Activity Tolerance: Patient limited by pain Patient left: in bed;with call bell/phone within reach;with bed alarm set Nurse Communication: Mobility status PT Visit Diagnosis: Unsteadiness on feet (R26.81);Other abnormalities of gait and mobility (R26.89);Repeated falls (R29.6);Pain Pain - Right/Left: Left Pain - part of body: Hip    Time: 4742-5956 PT Time Calculation (min) (ACUTE ONLY): 36 min   Charges:   PT Evaluation $PT Eval Low Complexity: 1 Low   PT General Charges $$ ACUTE PT VISIT: 1 Visit           Faron Tudisco PT, DPT 08/17/2022, 5:31 PM

## 2022-08-17 NOTE — Assessment & Plan Note (Signed)
PT evaluation Fall precautions

## 2022-08-18 ENCOUNTER — Observation Stay
Admit: 2022-08-18 | Discharge: 2022-08-18 | Disposition: A | Discharge status: Home / Self Care | Payer: Medicare HMO | Attending: Internal Medicine | Admitting: Internal Medicine

## 2022-08-18 DIAGNOSIS — Y92009 Unspecified place in unspecified non-institutional (private) residence as the place of occurrence of the external cause: Secondary | ICD-10-CM | POA: Diagnosis not present

## 2022-08-18 DIAGNOSIS — E039 Hypothyroidism, unspecified: Secondary | ICD-10-CM | POA: Diagnosis present

## 2022-08-18 DIAGNOSIS — M797 Fibromyalgia: Secondary | ICD-10-CM | POA: Diagnosis present

## 2022-08-18 DIAGNOSIS — Z5986 Financial insecurity: Secondary | ICD-10-CM | POA: Diagnosis not present

## 2022-08-18 DIAGNOSIS — Z9011 Acquired absence of right breast and nipple: Secondary | ICD-10-CM | POA: Diagnosis not present

## 2022-08-18 DIAGNOSIS — Z79890 Hormone replacement therapy (postmenopausal): Secondary | ICD-10-CM | POA: Diagnosis not present

## 2022-08-18 DIAGNOSIS — Z803 Family history of malignant neoplasm of breast: Secondary | ICD-10-CM | POA: Diagnosis not present

## 2022-08-18 DIAGNOSIS — D509 Iron deficiency anemia, unspecified: Secondary | ICD-10-CM | POA: Diagnosis present

## 2022-08-18 DIAGNOSIS — Z853 Personal history of malignant neoplasm of breast: Secondary | ICD-10-CM | POA: Diagnosis not present

## 2022-08-18 DIAGNOSIS — F0393 Unspecified dementia, unspecified severity, with mood disturbance: Secondary | ICD-10-CM | POA: Diagnosis present

## 2022-08-18 DIAGNOSIS — I890 Lymphedema, not elsewhere classified: Secondary | ICD-10-CM | POA: Diagnosis present

## 2022-08-18 DIAGNOSIS — M62838 Other muscle spasm: Secondary | ICD-10-CM | POA: Diagnosis present

## 2022-08-18 DIAGNOSIS — E785 Hyperlipidemia, unspecified: Secondary | ICD-10-CM | POA: Diagnosis present

## 2022-08-18 DIAGNOSIS — I951 Orthostatic hypotension: Secondary | ICD-10-CM | POA: Diagnosis present

## 2022-08-18 DIAGNOSIS — Z8249 Family history of ischemic heart disease and other diseases of the circulatory system: Secondary | ICD-10-CM | POA: Diagnosis not present

## 2022-08-18 DIAGNOSIS — F32A Depression, unspecified: Secondary | ICD-10-CM | POA: Diagnosis not present

## 2022-08-18 DIAGNOSIS — Z87891 Personal history of nicotine dependence: Secondary | ICD-10-CM | POA: Diagnosis not present

## 2022-08-18 DIAGNOSIS — R55 Syncope and collapse: Secondary | ICD-10-CM | POA: Diagnosis present

## 2022-08-18 DIAGNOSIS — F419 Anxiety disorder, unspecified: Secondary | ICD-10-CM | POA: Diagnosis not present

## 2022-08-18 DIAGNOSIS — Z79899 Other long term (current) drug therapy: Secondary | ICD-10-CM | POA: Diagnosis not present

## 2022-08-18 DIAGNOSIS — F209 Schizophrenia, unspecified: Secondary | ICD-10-CM | POA: Diagnosis present

## 2022-08-18 DIAGNOSIS — Z7983 Long term (current) use of bisphosphonates: Secondary | ICD-10-CM | POA: Diagnosis not present

## 2022-08-18 DIAGNOSIS — W010XXA Fall on same level from slipping, tripping and stumbling without subsequent striking against object, initial encounter: Secondary | ICD-10-CM | POA: Diagnosis present

## 2022-08-18 DIAGNOSIS — M8580 Other specified disorders of bone density and structure, unspecified site: Secondary | ICD-10-CM | POA: Diagnosis present

## 2022-08-18 DIAGNOSIS — Z90710 Acquired absence of both cervix and uterus: Secondary | ICD-10-CM | POA: Diagnosis not present

## 2022-08-18 DIAGNOSIS — F0394 Unspecified dementia, unspecified severity, with anxiety: Secondary | ICD-10-CM | POA: Diagnosis present

## 2022-08-18 DIAGNOSIS — Z79811 Long term (current) use of aromatase inhibitors: Secondary | ICD-10-CM | POA: Diagnosis not present

## 2022-08-18 DIAGNOSIS — M25552 Pain in left hip: Secondary | ICD-10-CM | POA: Diagnosis not present

## 2022-08-18 DIAGNOSIS — E861 Hypovolemia: Secondary | ICD-10-CM | POA: Diagnosis present

## 2022-08-18 DIAGNOSIS — Z66 Do not resuscitate: Secondary | ICD-10-CM | POA: Diagnosis present

## 2022-08-18 MED ORDER — MIDODRINE HCL 5 MG PO TABS
10.0000 mg | ORAL_TABLET | Freq: Three times a day (TID) | ORAL | Status: DC
  Administered 2022-08-18 – 2022-08-19 (×4): 10 mg via ORAL
  Filled 2022-08-18 (×4): qty 2

## 2022-08-18 MED ORDER — MIDODRINE HCL 5 MG PO TABS
5.0000 mg | ORAL_TABLET | Freq: Two times a day (BID) | ORAL | Status: DC
  Administered 2022-08-18: 5 mg via ORAL
  Filled 2022-08-18: qty 1

## 2022-08-18 MED ORDER — MIDODRINE HCL 5 MG PO TABS: 10.0000 mg | ORAL_TABLET | Freq: Three times a day (TID) | ORAL | Status: DC

## 2022-08-18 NOTE — Consult Note (Signed)
An attempt was made to see the patient. Patient refused to talk, she said " I don't want to talk to a psychiatrist, who asked you to come and talk to me"  Will try later if she is willing to participate in the assessment.

## 2022-08-18 NOTE — Plan of Care (Signed)

## 2022-08-18 NOTE — Progress Notes (Signed)
Progress Note   Patient: Elizabeth [name] DOB: [birth year] DOA: 08/16/2022     1 DOS: the patient was seen and examined on 08/19/2022   Brief hospital course: HPI on admission 08/17/22 per Dr. Clyde Lundborg: "Elizabeth [name] is a 72 y.o. female with medical history significant of pre-DM, hypothyroidism, GI bleeding, bipolar, schizophrenia, panic disorder, depression with anxiety, anemia, fibromyalgia, breast cancer (s/p for right mastectomy), right lymphedema, who presents with syncope and fall, diarrhea.   Patient states that she has diarrhea for more than 4 days, about 4 times of watery diarrhea each day.  Denies nausea, vomiting or abdominal pain.  Per her daughter-in-law at the bedside, pt passed out and fell in the bathroom in at about 7:15 AM by home camera recorder. Pt was on the floor for about an hour. She hit her head, but no headache or neck pain. She injured her left hip, causing left hip pain, which is constant, sharp, severe, nonradiating, associated with muscle spasm in left leg.  She also reports pain in her tailbone and left knee. Denies chest pain, cough, fever or chills.  Patient has mild shortness of breath.  No symptoms of UTI."   ED evaluation -- stable vitals, temp 99.9 F. CT head negative. Left knee x-ray negative Left hip/pelvic x-ray - subacute / healing left fracture of superior and inferior pubic rami CT Left Hip -- confirmed above. Orthopedic surgery was consulted. Recommended WBAT on left leg, anti-inflammatories.  PT.   Stable from Ortho's standpoint. Outpatient follow up, possible injections for hip arthritis.  Patient admitted for evaluation of Syncope felt due to hypovolemia, volume depletion related to diarrhea.  7/28: psychiatry consulted due to pt comments concerning for suicidality.  +Orthostatic vitals, adding midodrine. Family want pt to go to SNF, pt wants to go home.  Assessment and Plan:  * Syncope Suspect due to volume depletion  given 4 days diarrhea at home, and poor PO intake.  CT head negative.  Stable vitals, no hypoxia, no dyspnea, unlikely PE. BP's are soft and orthostatic vitals weakly positive. --Monitor off IV fluids --Patient refused Echocardiogram ordered as part of evaluation, no prior available echo --daily orthostatic vitals --Telemetry --Fall precautions  Orthostatic hypotension Most likely cause of syncope, in addition to volume depletion from diarrhea. Pt reports to family frequently being dizzy/lightheaded on standing for a very long time. 7/28 orthostatic vitals positive with BP dropping 116/74 >> 99/57, HR 44 >> 98. --Midodrine started  --Continue twice daily orthostatic vitals   Fall at home, initial encounter PT evaluation Fall precautions  Left hip pain Subacute Left Pubic Fracture CT scan of left hip is negative for fracture Orthopedic surgery consulted --PT/OT --Anti-inflammatories per orders --Ortho recommends WBAT on left leg, outpatient follow up for possible hip injection --Pain control -- PRN Tylenol, Percocet, morphine, Robaxin --Lidocaine patch   - Pain control: prn morphine, percocet and tyleno - When necessary Zofran for nausea - Robaxin for muscle spasm - Lidoderm patch for pain - PT/OT when able to (not ordered now)  Pubic ramus fracture, left, sequela .  Hypothyroidism Synthroid  Iron deficiency anemia Stable.  Continue iron supplement  Diarrhea None since admission.  4 days frequent diarrhea at home PTA.  Family report long history of chronic intermittent diarrhea in pt for many years. --C diff test cancelled --Monitor  --s/p IV fluids for volume repletion  Lymphedema of right arm S/p right mastectomy for breast cancer. Monitor.  Bipolar 1 disorder (HCC) Overall stable.  Per family  has intermittent paranoia and delusion, anxiety episodes. --PRN Ativan for agitation --Continue home Seroquel, Zoloft  ?Suicidal ideations - see PT note for comments  made by pt on 7/27 --Psychiatry consulted   Anxiety and depression See bipolar disorder  Schizophrenia (HCC) On home meds - Seroquel, Zoloft Delirium precautions  Contusion of left hip Orthopedic follow up Activity and weight-bearing as tolerated PT evaluation - SNF recommended Fall precautions  Primary osteoarthritis of left hip See left hip pain        Subjective: Pt awake resting in bed this AM.  She refused her AM meds, including midodrine.  When I saw her on on rounds, explained cannot discharge until BP's are better and new med added to help with that.  Then she agreed to take her meds.  Family arrived.  They are eager to hear input from psychiatry. Son states they got pt to agree to go to SNF, but pt tells me repeatedly she is going home.     Physical Exam: Vitals:   08/18/22 1201 08/18/22 1301 08/18/22 1547 08/19/22 0143  BP: (!) 99/57 119/69 (!) 104/94 (!) 119/58  Pulse: 98 74 90 61  Resp:  18 18 18   Temp:  97.9 F (36.6 C) 98.8 F (37.1 C) 98.1 F (36.7 C)  TempSrc:  Oral    SpO2:  98% 97% 99%  Weight:      Height:       General exam: awake, alert, no acute distress, frail Respiratory system: CTAB, no wheezes, rales or rhonchi, normal respiratory effort. Cardiovascular system: normal S1/S2, RRR, no pedal edema.   Gastrointestinal system: soft, NT, ND Central nervous system: A&O x self and hospital at least, does not further answer orientation questions or participate in neuro exam. no gross focal neurologic deficits, normal speech Extremities: moves all, no edema, normal tone Skin: dry, intact, normal temperature Psychiatry: anxious mood, flat affect, judgement and insight appear abnormal, no apparent hallucinations   Data Reviewed:  Notable labs -- normal BMP except Ca 7.9.  Hbg 9.1 from 10.5, WBC 3.6 UA no infection UDS negative  Family Communication: son and daughter-in-law at bedside this afternoon  Disposition: Status is: Observation The  patient remains OBS appropriate and will d/c before 2 midnights. Remains admitted for further IV hydration due to hypotension. Safe dispo planning underway, SNF recommended.  Vs Max HH support   Planned Discharge Destination: Home with Home Health    Time spent: 46 minutes  Author: Pennie Banter, DO 08/19/2022 8:01 AM  For on call review [email or web address].

## 2022-08-18 NOTE — Progress Notes (Signed)
Patient refused exam. She was very agitated and stated she does not need an ECHO. I told nurse I will try and come back later to see if patient changed her mind.  Dondra Prader RVT RCS

## 2022-08-18 NOTE — TOC Initial Note (Signed)
Transition of Care [facility]) - Initial/Assessment Note    Patient Details  Name: Elizabeth [name] MRN: [id number] Date of Birth: Jul 26, 1950  Transition [facility]) CM/SW Contact:    Kemper Durie, RN Phone Number: 08/18/2022, 4:41 PM  Clinical Narrative:                  Spoke with son and daughter in law, patient admitted from home.  Lives alone, has personal care aides 40 hours/week.  Son and DIL live about 3 hours away, but has cameras in the home where they can monitor patient when caregivers are not present.    PCP is with [facility] in [place], obtains medications from [facility] on [address].  She has rollator, a low mattress to decrease fall risk, rails in the shower and around the toilet.  Family aware of recommendations for HHPT, but are hoping to be able to get placement for short term rehab.  If not, they are ok with discharge home, [facility] has agreed to take referral per their preference (spoke with [place]).  Psych consult has been ordered, family interested to know if patient still has mental capacity to make sound decisions.  Medicare.gov link sent to son to review SNF ratings just in case placement is pursued.  TOC will continue to follow.   Expected Discharge Plan: Home w Home Health Services Barriers to Discharge: Continued Medical Work up   Patient Goals and CMS Choice            Expected Discharge Plan and Services     Post Acute Care Choice: Home Health Living arrangements for the past 2 months: Single Family Home                           HH Arranged: PT, Social Work [employer] Agency: [facility] Date HH Agency Contacted: 08/18/22 Time HH Agency Contacted: [phone] Representative spoke with at [facility] Agency: Laurelyn Sickle  Prior Living Arrangements/Services Living arrangements for the past 2 months: Single Family Home Lives with:: Self   Do you feel safe going back to the place where you live?: Yes          Current home services: DME, Other  (comment) (personal care aide)    Activities of Daily Living Home Assistive Devices/Equipment: Environmental consultant (specify type) (rollator) ADL Screening (condition at time of admission) Patient's cognitive ability adequate to safely complete daily activities?: Yes Is the patient deaf or have difficulty hearing?: No Does the patient have difficulty seeing, even when wearing glasses/contacts?: No Does the patient have difficulty concentrating, remembering, or making decisions?: Yes Patient able to express need for assistance with ADLs?: Yes Does the patient have difficulty dressing or bathing?: Yes Independently performs ADLs?: No Communication: Independent Dressing (OT): Needs assistance Is this a change from baseline?: Change from baseline, expected to last <3days Grooming: Independent Feeding: Independent Bathing: Needs assistance Is this a change from baseline?: Change from baseline, expected to last <3 days Toileting: Needs assistance Is this a change from baseline?: Change from baseline, expected to last <3 days In/Out Bed: Needs assistance Is this a change from baseline?: Change from baseline, expected to last <3 days Walks in Home: Needs assistance Is this a change from baseline?: Change from baseline, expected to last <3 days Does the patient have difficulty walking or climbing stairs?: Yes Weakness of Legs: Left Weakness of Arms/Hands: Right  Permission Sought/Granted  Emotional Assessment              Admission diagnosis:  Syncope and collapse [R55] Syncope [R55] Left hip pain [M25.552] Patient Active Problem List   Diagnosis Date Noted   Orthostatic hypotension 08/18/2022   Syncope 08/16/2022   Fall at home, initial encounter 08/16/2022   Iron deficiency anemia 08/16/2022   Pubic ramus fracture, left, sequela 08/16/2022   Left hip pain 08/16/2022   Diarrhea 08/16/2022   Lymphedema of right arm 08/16/2022   Primary osteoarthritis of left hip  08/16/2022   Contusion of left hip 08/16/2022   Schizophrenia (HCC)    Hypothyroidism    Anxiety and depression    Bipolar 1 disorder (HCC)    Major depressive disorder, recurrent severe without psychotic features (HCC) 04/21/2021   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HCC) 01/28/2021   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 (N), [place] - [address] [place] ([place]) [place] [address] Phone: [phone] Fax: [phone]     Social Determinants of Health (SDOH) Social History: SDOH Screenings   Food Insecurity: No Food Insecurity (08/16/2022)  Housing: Low Risk  (08/16/2022)  Transportation Needs: No Transportation Needs (08/16/2022)  Utilities: Not At Risk (08/16/2022)  Depression (PHQ2-9): High Risk (07/22/2022)  Financial Resource Strain: High Risk (07/31/2021)   Received from [facility], [facility]  Physical Activity: Insufficiently Active (08/06/2021)   Received from [facility], [facility]  Social Connections: Socially Isolated (07/31/2021)   Received from [facility], [facility]  Stress: No Stress Concern Present (08/27/2021)   Received from [facility], [facility]  Tobacco Use: Medium Risk (08/16/2022)   SDOH Interventions:     Readmission Risk Interventions     No data to display

## 2022-08-18 NOTE — Assessment & Plan Note (Signed)
Most likely cause of syncope, in addition to volume depletion from diarrhea. Pt reports to family frequently being dizzy/lightheaded on standing for a very long time. 7/28 orthostatic vitals positive with BP dropping 116/74 >> 99/57, HR 44 >> 98. --Midodrine started  --Home health RN requested to assist with monitoring closely at home

## 2022-08-18 NOTE — Progress Notes (Signed)
Patient refused AM CBG. Provider notified.

## 2022-08-18 NOTE — Plan of Care (Signed)

## 2022-08-19 ENCOUNTER — Inpatient Hospital Stay
Admit: 2022-08-19 | Discharge: 2022-08-19 | Disposition: A | Discharge status: Home / Self Care | Payer: Medicare HMO | Attending: Internal Medicine | Admitting: Internal Medicine

## 2022-08-19 DIAGNOSIS — M25552 Pain in left hip: Secondary | ICD-10-CM | POA: Diagnosis not present

## 2022-08-19 DIAGNOSIS — F209 Schizophrenia, unspecified: Secondary | ICD-10-CM | POA: Diagnosis not present

## 2022-08-19 DIAGNOSIS — R55 Syncope and collapse: Secondary | ICD-10-CM | POA: Diagnosis not present

## 2022-08-19 MED ORDER — MIDODRINE HCL 10 MG PO TABS: 10.0000 mg | ORAL_TABLET | Freq: Three times a day (TID) | ORAL | 1 refills | Status: DC

## 2022-08-19 MED ORDER — ACETAMINOPHEN 325 MG PO TABS: 650.0000 mg | ORAL_TABLET | Freq: Four times a day (QID) | ORAL | Status: DC | PRN

## 2022-08-19 MED ORDER — LIDOCAINE 4 % EX CREA: 1.0000 | TOPICAL_CREAM | CUTANEOUS | 0 refills | Status: DC | PRN

## 2022-08-19 NOTE — Discharge Summary (Signed)
Physician Discharge Summary   Patient: Elizabeth [name] MRN: [id number] DOB: 05-18-1950  Admit date:     08/16/2022  Discharge date: 08/19/22  Discharge Physician: Elizabeth [name]   [facility]P: [name]   Recommendations at discharge:   Follow up with Primary Care Follow up with Psychiatry Follow up with Home Health agency for PT and RN assistance  Discharge Diagnoses: Principal Problem:   Syncope Active Problems:   Orthostatic hypotension   Fall at home, initial encounter   Pubic ramus fracture, left, sequela   Left hip pain   Hypothyroidism   Iron deficiency anemia   Diarrhea   Lymphedema of right arm   Schizophrenia (HCC)   Anxiety and depression   Bipolar 1 disorder (HCC)   Primary osteoarthritis of left hip   Contusion of left hip  Resolved Problems:   * No resolved hospital problems. [facility] Course: HPI on admission 08/17/22 per Dr. Clyde [name]: "Elizabeth [name] is a 72 y.o. female with medical history significant of pre-DM, hypothyroidism, GI bleeding, bipolar, schizophrenia, panic disorder, depression with anxiety, anemia, fibromyalgia, breast cancer (s/p for right mastectomy), right lymphedema, who presents with syncope and fall, diarrhea.   Patient states that she has diarrhea for more than 4 days, about 4 times of watery diarrhea each day.  Denies nausea, vomiting or abdominal pain.  Per her daughter-in-law at the bedside, pt passed out and fell in the bathroom in at about 7:15 AM by home camera recorder. Pt was on the floor for about an hour. She hit her head, but no headache or neck pain. She injured her left hip, causing left hip pain, which is constant, sharp, severe, nonradiating, associated with muscle spasm in left leg.  She also reports pain in her tailbone and left knee. Denies chest pain, cough, fever or chills.  Patient has mild shortness of breath.  No symptoms of UTI."   ED evaluation -- stable vitals, temp 99.9 F. CT head  negative. Left knee x-ray negative Left hip/pelvic x-ray - subacute / healing left fracture of superior and inferior pubic rami CT Left Hip -- confirmed above. Orthopedic surgery was consulted. Recommended WBAT on left leg, anti-inflammatories.  PT.   Stable from Ortho's standpoint. Outpatient follow up, possible injections for hip arthritis.  Patient admitted for evaluation of Syncope felt due to hypovolemia, volume depletion related to diarrhea.  7/28 -- added midodrine for orthostatic hypotension  7/29 -- pt feels improved, BP's soft but improved.  She denies dizziness when standing and walking today, which she was having previously.  Pt is medically stable and agreeable to d/c home with home health PT and RN.  Son updated by phone and in agreement.    Assessment and Plan: * Syncope Suspect due to volume depletion given 4 days diarrhea at home, and poor PO intake.  +Orthostatic hypotension also. CT head negative.  Stable vitals, no hypoxia, no dyspnea, unlikely PE. BP's are soft and orthostatic vitals weakly positive. --Monitor off IV fluids --Patient refused Echocardiogram ordered as part of evaluation, no prior available echo --daily orthostatic vitals --Telemetry --Fall precautions  Orthostatic hypotension Most likely cause of syncope, in addition to volume depletion from diarrhea. Pt reports to family frequently being dizzy/lightheaded on standing for a very long time. 7/28 orthostatic vitals positive with BP dropping 116/74 >> 99/57, HR 44 >> 98. --Midodrine started  --Home health RN requested to assist with monitoring closely at home  Fall at home, initial encounter PT evaluation -  HH recommended, set up  Left hip pain Subacute Left Pubic Fracture CT scan of left hip is negative for fracture Orthopedic surgery consulted --PT/OT --Anti-inflammatories per orders --Ortho recommends WBAT on left leg, outpatient follow up for possible hip injection --Pain control -- PRN  Tylenol, Percocet, morphine, Robaxin --Lidocaine patch   - Pain control: prn morphine, percocet and tyleno - When necessary Zofran for nausea - Robaxin for muscle spasm - Lidoderm patch for pain - PT/OT when able to (not ordered now)  Pubic ramus fracture, left, sequela .  Hypothyroidism Synthroid  Iron deficiency anemia Stable.  Continue iron supplement  Diarrhea None since admission.  4 days frequent diarrhea at home PTA.  Family report long history of chronic intermittent diarrhea in pt for many years. --C diff test cancelled --Monitor  --s/p IV fluids for volume repletion  Lymphedema of right arm S/p right mastectomy for breast cancer. Monitor.  Bipolar 1 disorder (HCC) Overall stable.  Per family has intermittent paranoia and delusion, anxiety episodes. --Continue home Seroquel, Zoloft --Psychiatry consulted but pt declined to speak with them --Psychiatry follow up outpatient  7/29 - pt denies thoughts or plans for self harm  Anxiety and depression See bipolar disorder  Schizophrenia (HCC) On home meds - Seroquel, Zoloft Delirium precautions  Contusion of left hip Orthopedic follow up Activity and weight-bearing as tolerated PT evaluation - HH recommended  Primary osteoarthritis of left hip See left hip pain         Consultants: Psychiatry - pt refused Procedures performed: None - pt refused Echo  Disposition: Home health Diet recommendation:  Discharge Diet Orders (From admission, onward)     Start     Ordered   08/19/22 0000  Diet - low sodium heart healthy        08/19/22 1214            DISCHARGE MEDICATION: Allergies as of 08/19/2022       Reactions   Penicillins Hives   TOLERATED CEFAZOLIN        Medication List     TAKE these medications    acetaminophen 325 MG tablet Commonly known as: TYLENOL Take 2 tablets (650 mg total) by mouth every 6 (six) hours as needed for mild pain, fever or headache.   alendronate 70  MG tablet Commonly known as: FOSAMAX Take 1 tablet by mouth every Monday.   ferrous sulfate 325 (65 FE) MG tablet Take 1 tablet by mouth daily with breakfast.   letrozole 2.5 MG tablet Commonly known as: FEMARA Take 1 tablet (2.5 mg total) by mouth daily.   levothyroxine 88 MCG tablet Commonly known as: SYNTHROID Take 88 mcg by mouth daily before breakfast.   lidocaine 4 % cream Commonly known as: LMX Apply 1 Application topically as needed.   midodrine 10 MG tablet Commonly known as: PROAMATINE Take 1 tablet (10 mg total) by mouth 3 (three) times daily with meals.   QUEtiapine 25 MG tablet Commonly known as: SEROQUEL Take 1 tablet (25 mg total) by mouth at bedtime. Start taking on: August 28, 2022   sertraline 50 MG tablet Commonly known as: ZOLOFT Take 1 tablet (50 mg total) by mouth daily. Start taking on: August 24, 2022   silver sulfADIAZINE 1 % cream Commonly known as: Silvadene Apply 1 Application topically 2 (two) times daily.   Vitamin D (Ergocalciferol) 1.25 MG (50000 UNIT) Caps capsule Commonly known as: DRISDOL Take 50,000 Units by mouth once a week.  Discharge Exam: Filed Weights   08/16/22 1314  Weight: 50.9 kg   General exam: awake, alert, no acute distress HEENT: moist mucus membranes, hearing grossly normal  Respiratory system: CTAB, no wheezes, rales or rhonchi, normal respiratory effort. Cardiovascular system: normal S1/S2, RRR, no JVD, murmurs, rubs, gallops Gastrointestinal system: soft, NT, ND, no HSM felt, +bowel sounds. Central nervous system: A&O x3. no gross focal neurologic deficits, normal speech Extremities: moves all, no edema, normal tone Skin: dry, intact, normal temperature, normal color, No rashes, lesions or ulcers Psychiatry: normal mood, congruent affect, judgement and insight appear normal   Condition at discharge: stable  The results of significant diagnostics from this hospitalization (including imaging,  microbiology, ancillary and laboratory) are listed below for reference.   Imaging Studies: DG Knee 1-2 Views Left  Result Date: 08/16/2022 CLINICAL DATA:  Fall. EXAM: LEFT KNEE - 1-2 VIEW COMPARISON:  None Available. FINDINGS: The bones are subjectively under mineralized. Tricompartmental osteoarthritis with peripheral spurring and medial tibiofemoral joint space narrowing. Trace knee joint effusion but no lipohemarthrosis. No bony destructive change. No focal soft tissue abnormalities. IMPRESSION: 1. No fracture or subluxation of the left knee. 2. Tricompartmental osteoarthritis. Electronically Signed   By: Narda Rutherford M.D.   On: 08/16/2022 17:45   CT Hip Left Wo Contrast  Result Date: 08/16/2022 CLINICAL DATA:  Posttraumatic left hip pain.  Fracture suspected. EXAM: CT OF THE LEFT HIP WITHOUT CONTRAST TECHNIQUE: Multidetector CT imaging of the left hip was performed according to the standard protocol. Multiplanar CT image reconstructions were also generated. RADIATION DOSE REDUCTION: This exam was performed according to the departmental dose-optimization program which includes automated exposure control, adjustment of the mA and/or kV according to patient size and/or use of iterative reconstruction technique. COMPARISON:  The left hip radiographs same date. Pelvic CT 01/29/2022. FINDINGS: Bones/Joint/Cartilage As seen on the earlier radiographs, there are subacute, healing fractures of the left superior and inferior pubic rami which are new compared with the pelvic CT of 01/29/2022. These fractures appear incompletely healed. There is a fully healed fracture of the left sacral ala which has occurred in the interval as well. No evidence of acute fracture or dislocation. Mild degenerative changes at the left sacroiliac joint. Advanced left hip osteoarthritis with marked joint space narrowing and osteophytes. There is a small left hip joint effusion. Ligaments Suboptimally assessed by CT. Muscles and  Tendons No evidence of focal muscular atrophy or hematoma. Soft tissues No evidence of periarticular hematoma, foreign body or soft tissue emphysema. IMPRESSION: 1. Subacute, healing fractures of the left superior and inferior pubic rami and left sacral ala. 2. No evidence of acute fracture or dislocation. 3. Advanced left hip osteoarthritis. Electronically Signed   By: Carey Bullocks M.D.   On: 08/16/2022 16:45   CT HEAD WO CONTRAST ( )  Result Date: 08/16/2022 CLINICAL DATA:  Head trauma, minor (Age >= 65y) EXAM: CT HEAD WITHOUT CONTRAST TECHNIQUE: Contiguous axial images were obtained from the base of the skull through the vertex without intravenous contrast. RADIATION DOSE REDUCTION: This exam was performed according to the departmental dose-optimization program which includes automated exposure control, adjustment of the mA and/or kV according to patient size and/or use of iterative reconstruction technique. COMPARISON:  CT scan head from 07/18/2021. FINDINGS: Brain: No evidence of acute infarction, hemorrhage, hydrocephalus, extra-axial collection or mass lesion/mass effect. Vascular: No hyperdense vessel or unexpected calcification. Skull: Normal. Negative for fracture or focal lesion. Sinuses/Orbits: No acute finding. Other: Soft tissue attenuation areas noted in  the right external auditory canal, likely cerumen. IMPRESSION: 1. No acute intracranial abnormality. No calvarial fracture. Electronically Signed   By: Jules Schick M.D.   On: 08/16/2022 14:58   DG Hip Unilat W or Wo Pelvis 2-3 Views Left  Result Date: 08/16/2022 CLINICAL DATA:  fall,left hip pain EXAM: DG HIP (WITH OR WITHOUT PELVIS) 2-3V LEFT COMPARISON:  Lumbar spine radiograph from 01/29/2022. FINDINGS: Normal and symmetric bilateral sacroiliac joints. No acute fracture. Note is made of the pubic diastasis. Subacute/healing fracture of the left superior and inferior pubic rami noted. Mild degenerative changes of right hip joint and  moderate-to-severe degenerative changes of left hip joint. No radiopaque foreign body. Soft tissues are within normal limits. IMPRESSION: 1. Subacute/healing fracture of the left superior and inferior pubic rami. 2. Moderate-to-severe degenerative changes of the left hip joint. Electronically Signed   By: Jules Schick M.D.   On: 08/16/2022 14:52    Microbiology: Results for orders placed or performed during the hospital encounter of 07/18/21  SARS Coronavirus 2 by RT PCR (hospital order, performed in [facility] hospital lab) *cepheid single result test* Anterior Nasal Swab     Status: None   Collection Time: 07/18/21  7:45 PM   Specimen: Anterior Nasal Swab  Result Value Ref Range Status   SARS Coronavirus 2 by RT PCR NEGATIVE NEGATIVE Final    Comment: (NOTE) SARS-CoV-2 target nucleic acids are NOT DETECTED.  The SARS-CoV-2 RNA is generally detectable in upper and lower respiratory specimens during the acute phase of infection. The lowest concentration of SARS-CoV-2 viral copies this assay can detect is 250 copies / mL. A negative result does not preclude SARS-CoV-2 infection and should not be used as the sole basis for treatment or other patient management decisions.  A negative result may occur with improper specimen collection / handling, submission of specimen other than nasopharyngeal swab, presence of viral mutation(s) within the areas targeted by this assay, and inadequate number of viral copies (<250 copies / mL). A negative result must be combined with clinical observations, patient history, and epidemiological information.  Fact Sheet for Patients:   RoadLapTop.co.za  Fact Sheet for Healthcare Providers: http://kim-miller.com/  This test is not yet approved or  cleared by the [place] FDA and has been authorized for detection and/or diagnosis of SARS-CoV-2 by FDA under an Emergency Use Authorization (EUA).  This EUA will  remain in effect (meaning this test can be used) for the duration of the COVID-19 declaration under Section 564(b)(1) of the Act, 21 U.S.C. section 360bbb-3(b)(1), unless the authorization is terminated or revoked sooner.  Performed at [facility], [address] [place], [place] [address]     Labs: CBC: Recent Labs  Lab 08/16/22 1335 08/17/22 0450  WBC 4.6 3.6*  NEUTROABS 3.3  --   HGB 10.5* 9.1*  HCT 32.1* 26.9*  MCV 94.4 93.1  PLT 319 277   Basic Metabolic Panel: Recent Labs  Lab 08/16/22 1335 08/17/22 0450  NA 138 138  K 3.8 3.9  CL 104 108  CO2 25 24  GLUCOSE 103* 86  BUN 13 15  CREATININE 0.72 0.72  CALCIUM 8.5* 7.9*   Liver Function Tests: Recent Labs  Lab 08/16/22 1335  AST 18  ALT 14  ALKPHOS 97  BILITOT 0.5  PROT 6.7  ALBUMIN 3.0*   CBG: No results for input(s): "GLUCAP" in the last 168 hours.  Discharge time spent: greater than 30 minutes.  Signed: Pennie Banter, DO [facility]  08/19/2022 

## 2022-08-19 NOTE — Progress Notes (Signed)
Discharge instructions reviewed with patient and family member. They both indicated they understood discharge instructions

## 2022-08-19 NOTE — Progress Notes (Signed)
Physical Therapy Treatment Patient Details Name: Elizabeth [name] MRN: [id number] DOB: 1950-12-08 Today's Date: 08/19/2022   History of Present Illness Elizabeth [name] is a 72 y.o. female with medical history significant of pre-DM, hypothyroidism, GI bleeding, bipolar, schizophrenia, panic disorder, depression with anxiety, anemia, fibromyalgia, breast cancer (s/p for right mastectomy), right lymphedema, who presents with syncope and fall, diarrhea.     Patient states that she has diarrhea for more than 4 days, about 4 times of watery diarrhea each day.  Denies nausea, vomiting or abdominal pain.  Per her daughter-in-law at the bedside, pt passed out and fell in the bathroom in at about 7:15 AM by home camera recorder. 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    PT Comments  Pt received in bed, agreed to PT session, anxious to return home. ModI for bed mobility, Sit<>stand transfers with Supervision/SBA, Gait training on level surface with RW 125 ft x 2 with 2 minute rest break. No LOB noted, pt tends to be a little impulsive and ambulates at fast pace. Attempted education with fair understanding, however continued education beneficial. Seated BP 94/51, HR 66, no c/o dizziness throughout session.    If plan is discharge home, recommend the following: A little help with walking and/or transfers;A little help with bathing/dressing/bathroom;Assistance with cooking/housework;Direct supervision/assist for financial management;Direct supervision/assist for medications management;Help with stairs or ramp for entrance   Can travel by private vehicle        Equipment Recommendations  None recommended by PT    Recommendations for Other Services       Precautions / Restrictions Precautions Precautions: Fall Restrictions Weight Bearing Restrictions: Yes LLE Weight Bearing: Weight bearing as tolerated     Mobility  Bed Mobility Overal bed  mobility: Modified Independent             General bed mobility comments: pt is impulsive at times    Transfers Overall transfer level: Needs assistance Equipment used: Rolling walker (2 wheels) Transfers: Sit to/from Stand Sit to Stand: Supervision           General transfer comment: pt impulsive and anxious, vc's for safety    Ambulation/Gait Ambulation/Gait assistance: Min guard, Supervision Gait Distance (Feet): 125 Feet Assistive device: Rolling walker (2 wheels) Gait Pattern/deviations: Step-through pattern, Trunk flexed (knees flexed) Gait velocity: WFL, can be a little quick as patient is impulsive and anxious     General Gait Details: Increased L heel strike this date, knees remain slightly flexed during gait, pt declined to attempt to correct.   Stairs             Wheelchair Mobility     Tilt Bed    Modified Rankin (Stroke Patients Only)       Balance Overall balance assessment: Needs assistance Sitting-balance support: Feet supported Sitting balance-Leahy Scale: Good     Standing balance support: Bilateral upper extremity supported, During functional activity Standing balance-Leahy Scale: Fair Standing balance comment: exhibits impulsive behavior, quick to stand up and walk as she is anxious to return home                            Cognition Arousal/Alertness: Awake/alert Behavior During Therapy: Agitated, Anxious Overall Cognitive Status: Within Functional Limits for tasks assessed  General Comments: Pt pleasant during session and willing to increase activity and get OOB.        Exercises      General Comments General comments (skin integrity, edema, etc.):  (c/o skin dryness upper chest, pt given warm wash cloth and lotion with good relief. Pt educated on safe use of AD and reducing falls with mobility)      Pertinent Vitals/Pain Pain Assessment Pain Assessment:  Faces Faces Pain Scale: Hurts a little bit Pain Location: left hip/pelvis Pain Descriptors / Indicators: Sore Pain Intervention(s): Monitored during session    Home Living                          Prior Function            PT Goals (current goals can now be found in the care plan section) Acute Rehab PT Goals Patient Stated Goal: to go home Progress towards PT goals: Progressing toward goals    Frequency    Min 1X/week      PT Plan      Co-evaluation              AM-PAC PT "6 Clicks" Mobility   Outcome Measure  Help needed turning from your back to your side while in a flat bed without using bedrails?: None Help needed moving from lying on your back to sitting on the side of a flat bed without using bedrails?: None Help needed moving to and from a bed to a chair (including a wheelchair)?: A Little Help needed standing up from a chair using your arms (e.g., wheelchair or bedside chair)?: A Little Help needed to walk in hospital room?: A Little Help needed climbing 3-5 steps with a railing? : A Lot 6 Click Score: 19    End of Session Equipment Utilized During Treatment: Gait belt Activity Tolerance: Patient tolerated treatment well Patient left: in chair;with call bell/phone within reach;with chair alarm set Nurse Communication: Mobility status PT Visit Diagnosis: Unsteadiness on feet (R26.81);Other abnormalities of gait and mobility (R26.89);Repeated falls (R29.6);Pain Pain - Right/Left: Left Pain - part of body: Hip     Time: 1130-1200 PT Time Calculation (min) (ACUTE ONLY): 30 min  Charges:    $Gait Training: 8-22 mins $Therapeutic Activity: 8-22 mins PT General Charges $$ ACUTE PT VISIT: 1 Visit                    Zadie Cleverly, PTA  Jannet Askew 08/19/2022, 12:25 PM

## 2022-08-19 NOTE — Plan of Care (Signed)

## 2022-08-19 NOTE — TOC Progression Note (Addendum)
Transition of Care [facility]) - Progression Note    Patient Details  Name: Elizabeth [name] MRN: [id number] Date of Birth: Jan 11, 1951  [facility] CM/SW Contact  Marlowe Sax, RN Phone Number: 08/19/2022, 11:43 AM  Clinical Narrative:     Reached out to Bunnie Domino He stated that she currently has a Therapist, sports at home. She has a Rollator at home He stated that they have monitors They have suction cup grab bars in place Has a low profile bed at home Has [facility] set up for [facility] She has 41 hours per week thru [facility] for home services including Transport  He son will transport home I explained that some [employer] plans offer a PCS for a limited time I provided [facility] contact for the PCS with [employer] I explained that if [facility] does not cover her PCS then they can private pay He stated thanks and will pick her up today      Expected Discharge Plan: Home w Home Health Services Barriers to Discharge: Continued Medical Work up  Expected Discharge Plan and Services     Post Acute Care Choice: Home Health Living arrangements for the past 2 months: Single Family Home                           HH Arranged: PT, Social Work [employer] Agency: [facility] Date HH Agency Contacted: 08/18/22 Time HH Agency Contacted: [id number] Representative spoke with at [facility] Agency: Laurelyn Sickle   Social Determinants of Health (SDOH) Interventions SDOH Screenings   Food Insecurity: No Food Insecurity (08/16/2022)  Housing: Low Risk  (08/16/2022)  Transportation Needs: No Transportation Needs (08/16/2022)  Utilities: Not At Risk (08/16/2022)  Depression (PHQ2-9): High Risk (07/22/2022)  Financial Resource Strain: High Risk (07/31/2021)   [facility] from [facility], [facility]  Physical Activity: Insufficiently Active (08/06/2021)   Received from [facility], [facility]  Social Connections: Socially  Isolated (07/31/2021)   Received from [facility], [facility]  Stress: No Stress Concern Present (08/27/2021)   Received from [facility], [facility]  Tobacco Use: Medium Risk (08/16/2022)    Readmission Risk Interventions     No data to display

## 2022-08-20 ENCOUNTER — Ambulatory Visit: Payer: Medicare HMO

## 2022-09-09 NOTE — Progress Notes (Signed)
BH MD/PA/NP OP Progress Note  09/16/2022 11:48 AM Elizabeth [name]  MRN:  [id number]  Chief Complaint:  Chief Complaint  Patient presents with   Follow-up   HPI:  According to the chart review, the following events have occurred since the last visit: The patient was admitted due to syncope, fall likely due to volume depletion/diarrhea. Midodrine was started.   This is a follow-up appointment for mood disorder, insomnia.  Although she tends a minimally responsive to questions, she states calmer through the visit. She states that she feels terrible.  Although she goes to the day program, other people would not speak with her.  She initially states that she is not interested in speaking with others either, although she later states that other people will not speak with her.  She feels good with her home aids.  When she is asked about antichrist, she answers "I do (have the thought of her being antichrist)."  She states that her clothes are falling off due to weight loss.  She states that everybody is talking about her losing weight.  She does not like the way she looks.  Although she was initially adamant not to uptitrate sertraline, she denies any side effect from the medication, and she later agreed to try this, answering "whatever." She has good appetite. She denies SI.  Onalee Hua, her son presents to the visit.  She was recently admitted due to fall.  She did not have any concussion.  She now has support at home every day, including administrating her medication.  She has been taking medication consistently. He thinks her mood has been stable. She does not yell anymore.  In retrospect, he thinks she may have been experiencing paranoia.  She talks about antichrist, people are looking at her, or getting locked up.    Wt Readings from Last 3 Encounters:  09/16/22 111 lb 3.2 oz (50.4 kg)  08/16/22 112 lb 3.4 oz (50.9 kg)  07/22/22 112 lb 3.2 oz (50.9 kg)     Wt Readings from Last 3 Encounters:   09/16/22 111 lb 3.2 oz (50.4 kg)  08/16/22 112 lb 3.4 oz (50.9 kg)  07/22/22 112 lb 3.2 oz (50.9 kg)      Visit Diagnosis:    ICD-10-CM   1. Bipolar affective disorder, currently depressed, moderate (HCC)  F31.32     2. Insomnia, unspecified type  G47.00       Past Psychiatric History: Please see initial evaluation for full details. I have reviewed the history. No updates at this time.     Past Medical History:  Past Medical History:  Diagnosis Date   Acute cholecystitis 2016   Anemia    Anemia 2016   Anxiety    panic attacks   Anxiety and depression    Bipolar 1 disorder (HCC)    Bowel obstruction (HCC) 2017   Common bile duct dilatation 2015   Dyspnea    Edema    Fibromyalgia    GI bleed 2017   History of kidney stones 01/21/2009   HSV-1 infection    Hyperglycemia    Hyperlipidemia    Hypokalemia    Hypothyroidism    Liver cyst    Malignant neoplasm of right female breast, unspecified estrogen receptor status, unspecified site of breast (HCC)    Memory loss    Osteopenia    Panic disorder    Schizophrenia (HCC)    Ventral hernia     Past Surgical History:  Procedure Laterality Date  ABDOMINAL HYSTERECTOMY     BREAST BIOPSY Right 01/19/2021   [place] Bx, ribbon, path pending   BREAST BIOPSY Right 01/19/2021   [place] Bx, Venus, path pending   BREAST BIOPSY Right 01/19/2021   [place] Bx, heart, path pending   BREAST BIOPSY Right 01/19/2021   [place] BX Axilla, Coil, path pending   CESAREAN SECTION     CHOLECYSTECTOMY OPEN  11/17/2013   with repair of bile duct-Dr. Egbert Garibaldi   MASTECTOMY MODIFIED RADICAL Right 03/02/2021   Procedure: MASTECTOMY MODIFIED RADICAL;  Surgeon: Sung Amabile, DO;  Location: [facility];  Service: General;  Laterality: Right;   OPEN REDUCTION INTERNAL FIXATION (ORIF) DISTAL RADIAL FRACTURE Right 12/20/2014   Procedure: OPEN REDUCTION INTERNAL FIXATION (ORIF) RIGHT DISTAL RADIAL FRACTURE;  Surgeon: Sheral Apley, MD;  Location: [facility];  Service: Orthopedics;  Laterality: Right;   STOMACH SURGERY      Family Psychiatric History: Please see initial evaluation for full details. I have reviewed the history. No updates at this time.    Family History:  Family History  Problem Relation Age of Onset   Heart disease Father    Breast cancer Paternal Aunt    Kidney disease Paternal Aunt    Breast cancer Paternal Grandmother    Asthma Daughter    Bladder Cancer Neg Hx     Social History:  Social History   Socioeconomic History   Marital status: Married    Spouse name: Not on file   Number of children: 1   Years of education: Not on file   Highest education level: Not on file  Occupational History   Not on file  Tobacco Use   Smoking status: Former    Current packs/day: 0.00    Types: Cigarettes    Quit date: 11/03/1973    Years since quitting: 48.9    Passive exposure: Past   Smokeless tobacco: Never   Tobacco comments:    Smoked briefly as a teen- lives in secondhand smoke.   Vaping Use   Vaping status: Never Used  Substance and Sexual Activity   Alcohol use: No    Alcohol/week: 0.0 standard drinks of alcohol   Drug use: No   Sexual activity: Not Currently  Other Topics Concern   Not on file  Social History Narrative   Not on file   Social Determinants of Health   Financial Resource Strain: Low Risk  (08/20/2022)   Received from [facility]   Overall Financial Resource Strain (CARDIA)    Difficulty of Paying Living Expenses: Not hard at all  Food Insecurity: No Food Insecurity (08/20/2022)   Received from [facility]   Hunger Vital Sign    Worried About Running Out of Food in the Last Year: Never true    Ran Out of Food in the Last Year: Never true  Transportation Needs: No Transportation Needs (08/20/2022)   Received from [facility] - Transportation    In the past 12 months, has lack of transportation kept you from medical  appointments or from getting medications?: No    Lack of Transportation (Non-Medical): No  Physical Activity: Insufficiently Active (08/06/2021)   Received from [facility], [facility]   Exercise Vital Sign    Days of Exercise per Week: 2 days    Minutes of Exercise per Session: 30 min  Stress: No Stress Concern Present (08/27/2021)   Received from [facility], [facility]   [employer] of Occupational Health - Occupational Stress Questionnaire    Feeling of Stress : Only a little  Social Connections: Socially Isolated (07/31/2021)   Received from [facility], [facility]   Social Connection and Isolation Panel [NHANES]    Frequency of Communication with Friends and Family: More than three times a week    Frequency of Social Gatherings with Friends and Family: Three times a week    Attends Religious Services: Never    Active Member of Clubs or Organizations: No    Attends Banker Meetings: Never    Marital Status: Widowed    Allergies:  Allergies  Allergen Reactions   Penicillins Hives    TOLERATED CEFAZOLIN    Metabolic Disorder Labs: No results found for: "HGBA1C", "MPG" No results found for: "PROLACTIN" No results found for: "CHOL", "TRIG", "HDL", "CHOLHDL", "VLDL", "LDLCALC" No results found for: "TSH"  Therapeutic Level Labs: No results found for: "LITHIUM" Lab Results  Component Value Date   VALPROATE <10 (L) 07/18/2021   No results found for: "CBMZ"  Current Medications: Current Outpatient Medications  Medication Sig Dispense Refill   acetaminophen (TYLENOL) 325 MG tablet Take 2 tablets (650 mg total) by mouth every 6 (six) hours as needed for mild pain, fever or headache.     alendronate (FOSAMAX) 70 MG tablet Take 1 tablet by mouth every Monday.     ferrous sulfate 325 (65 FE) MG tablet Take 1 tablet by mouth daily with breakfast.      letrozole (FEMARA) 2.5 MG tablet Take 1 tablet (2.5 mg total) by mouth daily. 90 tablet 3   levothyroxine (SYNTHROID, LEVOTHROID) 88 MCG tablet Take 88 mcg by mouth daily before breakfast.     lidocaine (LMX) 4 % cream Apply 1 Application topically as needed. 30 g 0   midodrine (PROAMATINE) 10 MG tablet Take 1 tablet (10 mg total) by mouth 3 (three) times daily with meals. 90 tablet 1   QUEtiapine (SEROQUEL) 25 MG tablet Take 1 tablet (25 mg total) by mouth at bedtime. 90 tablet 0   silver sulfADIAZINE (SILVADENE) 1 % cream Apply 1 Application topically 2 (two) times daily. 50 g 0   Vitamin D, Ergocalciferol, (DRISDOL) 1.25 MG (50000 UNIT) CAPS capsule Take 50,000 Units by mouth once a week.     sertraline (ZOLOFT) 50 MG tablet Take 1.5 tablets (75 mg total) by mouth at bedtime. 135 tablet 0   No current facility-administered medications for this visit.     Musculoskeletal: Strength & Muscle Tone: within normal limits Gait & Station:  uses a walker Patient leans: N/A  Psychiatric Specialty Exam: Review of Systems  Psychiatric/Behavioral:  Positive for decreased concentration, dysphoric mood and sleep disturbance. Negative for agitation, behavioral problems, confusion, hallucinations, self-injury and suicidal ideas. The patient is nervous/anxious. The patient is not hyperactive.   All other systems reviewed and are negative.   Blood pressure 113/73, pulse 93, temperature (!) 97.3 F (36.3 C), temperature source Skin, height 5' 2.01" (1.575 m), weight 111 lb 3.2 oz (50.4 kg).Body mass index is 20.33 kg/m.  General Appearance: Fairly Groomed  Eye Contact:  Fair  Speech:  Clear and Coherent  Volume:  Normal  Mood:  Anxious and Depressed  Affect:  Appropriate, Congruent, and Restricted  Thought Process:  Coherent  Orientation:  Full (Time, Place, and Person)  Thought Content: Delusions   Suicidal Thoughts:  No  Homicidal Thoughts:  No  Memory:  Immediate;   Good  Judgement:  Fair   Insight:  Lacking  Psychomotor Activity:  Normal, Normal tone, no rigidity, no resting/postural tremors, no tardive dyskinesia    Concentration:  Concentration: Fair and Attention Span: Fair  Recall:  [employer] of Knowledge: Good  Language: Good  Akathisia:  No  Handed:  Right  AIMS (if indicated): not done  Assets:  Communication Skills Desire for Improvement  ADL's:  Intact  Cognition: WNL  Sleep:  Fair   Screenings: GAD-7    Flowsheet Row Office Visit from 07/22/2022 in [facility] Office Visit from 02/25/2022 in [facility] Office Visit from 12/31/2021 in [facility] Office Visit from 11/05/2021 in [facility]  Total GAD-7 Score 21 19 18 21       PHQ2-9    Flowsheet Row Office Visit from 07/22/2022 in [facility] Office Visit from 04[facility]22/2022 in [facility] Office Visit from 02/25/2022 in [facility] Office Visit from 12/31/2021 in [facility] Office Visit from 11/05/2021 in [facility]  PHQ-2 Total Score 6 6 6 4 2   PHQ-9 Total Score 27 22 18 16 11       Flowsheet Row ED to Hosp-Admission (Discharged) from 08/16/2022 in [facility] Office Visit from 07/22/2022 in [facility] Office Visit from 04/22/2022 in [facility]  C-SSRS RISK CATEGORY No Risk Error: Q3, 4, or 5 should not be populated when Q2 is No Error: Q3, 4, or 5 should not be populated when Q2 is No        Assessment and Plan:  KILEA ABDELLATIF is a 72 y.o. 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   1. Bipolar affective disorder, currently depressed, moderate (HCC) 2. Insomnia, unspecified type R/o MDD with psychotic features  Acute stressors include: undergoing radiation treatment for breast caner s/p mastectomy Other stressors include:    History:  history of bipolar disorder (has this diagnosis according to her son, although she has no known manic symptoms), r/o early manifestation of neuropsychiatric symptoms secondary to underlying neurocognitive disorder.   Although she continues to demonstrate ego-syntonic thoughts of being antichrist, preoccupation with somatic symptoms, loss along with her irritability appears to be overall improving since she has started to take medication consistently.  We uptitrate sertraline to optimize treatment for depression, anxiety while monitoring any possible manic symptoms.  Although she has a history of bipolar disorder, her son has observed symptoms of paranoia, although there is no known manic symptoms.  Will continue to assess. Noted that although ECT was previously discussed, neither the patient nor her son is interested in this treatment.   R/o cognitive impairment Functional Status   IADL: Independent in the following: managing finances, medications, driving           Requires assistance with the following:  ADL  Independent in the following: bathing and hygiene, feeding, continence, grooming and toileting, walking          Requires assistance with the following: Folate, Vitamin B12, TSH Images Brain MRI 01/2022  Brain: Diffusion imaging does not show  any acute or subacute infarction. No focal abnormality affects the brainstem or cerebellum. Cerebral hemispheres show subjectively the relative absence of volume loss. There are scattered foci of T2 and FLAIR signal within the white matter consistent with  mild to low moderate chronic small-vessel ischemic change. No advanced disease. No cortical or large vessel territory infarction. No mass lesion, hemorrhage, hydrocephalus or extra-axial collection. No generalized or lobar specific volume loss. Moderate chronic small-vessel ischemic changes of the cerebral hemispheric white matter. No acute or reversible finding.             amyloid PET 05/2022.: Negative study indicating sparse or no amyloid neuritic plaque Neuropsych assessment: she was seen at [facility] clinic 12/2021. Dx r/o late onset alzheimer. MMSE Total Score 26/30 per chart. (MOCA 14 in 10/2021) Etiology:  Lewy body dementia, Alzheimer's disease, and/or depression Unchanged. I extensively reviewed chart outside of the system. PET scan was not conclusive for Alzheimer's disease.  It has been very difficult to tease out whether her current psychotic natures are only secondary to mood symptoms and or any component of neurodegenerative disorder. Her son was advised to reach out to them for ongoing evaluation at the last visit.   Plan  Increase sertraline 75 mg daily Continue quetiapine 25 mg at night (QTc HR 73, QTC 427 msec 03/2022) Next appointment: 10/21 at 10:30 - consent form to have 2-way conversation with her son, Onalee Hua is on file.    The patient demonstrates the following risk factors for suicide: Chronic risk factors for suicide include: psychiatric disorder of depression . Acute risk factors for suicide include: unemployment, social withdrawal/isolation, and recent suicide attempt/discharges from inpatient psychiatry. Protective factors for this patient include: positive social support. Considering these factors, the overall suicide risk at this point appears to be moderate, but not at imminent risk. There are no guns in the house. Patient is appropriate for outpatient follow up.     Collaboration of Care: Collaboration of Care: Other reviewed notes in Epic, collaborate with her  son  Patient/Guardian was advised Release of Information must be obtained prior to any record release in order to collaborate their care with an outside provider. Patient/Guardian was advised if they have not already done so to contact the registration department to sign all necessary forms in order for [place] to release information regarding their care.   Consent: Patient/Guardian gives verbal consent for treatment and assignment of benefits for services provided during this visit. Patient/Guardian expressed understanding and agreed to proceed.    Neysa Hotter, MD 09/16/2022, 11:48 AM

## 2022-09-16 ENCOUNTER — Ambulatory Visit (INDEPENDENT_AMBULATORY_CARE_PROVIDER_SITE_OTHER): Payer: Medicare HMO | Admitting: Psychiatry

## 2022-09-16 ENCOUNTER — Encounter: Payer: Self-pay | Admitting: Psychiatry

## 2022-09-16 VITALS — BP 113/73 | HR 93 | Temp 97.3°F | Ht 62.01 in | Wt 111.2 lb

## 2022-09-16 DIAGNOSIS — F3132 Bipolar disorder, current episode depressed, moderate: Secondary | ICD-10-CM

## 2022-09-16 DIAGNOSIS — G4700 Insomnia, unspecified: Secondary | ICD-10-CM | POA: Diagnosis not present

## 2022-09-16 MED ORDER — SERTRALINE HCL 50 MG PO TABS: 75.0000 mg | ORAL_TABLET | Freq: Every day | ORAL | 0 refills | Status: DC

## 2022-09-16 NOTE — Patient Instructions (Signed)
Increase sertraline 75 mg daily Continue quetiapine 25 mg at night  Next appointment: 10/21 at 10:30

## 2022-10-02 ENCOUNTER — Telehealth: Payer: Self-pay | Admitting: Physician Assistant

## 2022-10-02 NOTE — Telephone Encounter (Signed)
Pt left answering service message asking if she has been cleared to go to the dentist. She also has a billing question that I will follow-up on.

## 2022-10-18 ENCOUNTER — Other Ambulatory Visit: Payer: Self-pay | Admitting: Oncology

## 2022-10-30 ENCOUNTER — Ambulatory Visit: Payer: Medicare HMO | Admitting: Radiation Oncology

## 2022-10-31 ENCOUNTER — Encounter: Payer: Self-pay | Admitting: Radiation Oncology

## 2022-10-31 ENCOUNTER — Ambulatory Visit
Admission: RE | Admit: 2022-10-31 | Discharge: 2022-10-31 | Disposition: A | Discharge status: Home / Self Care | Payer: Medicare HMO | Source: Ambulatory Visit | Attending: Radiation Oncology | Admitting: Radiation Oncology

## 2022-10-31 VITALS — BP 104/68 | HR 111 | Temp 98.0°F | Resp 20 | Wt 112.0 lb

## 2022-10-31 DIAGNOSIS — C792 Secondary malignant neoplasm of skin: Secondary | ICD-10-CM | POA: Diagnosis present

## 2022-10-31 DIAGNOSIS — C50411 Malignant neoplasm of upper-outer quadrant of right female breast: Secondary | ICD-10-CM | POA: Insufficient documentation

## 2022-10-31 DIAGNOSIS — Z170 Estrogen receptor positive status [ER+]: Secondary | ICD-10-CM | POA: Insufficient documentation

## 2022-10-31 DIAGNOSIS — Z923 Personal history of irradiation: Secondary | ICD-10-CM | POA: Diagnosis not present

## 2022-10-31 NOTE — Progress Notes (Signed)
Radiation Oncology Follow up Note  Name: Elizabeth [name]   Date:   10/31/2022 MRN:  [id number] DOB: 02/03/1950    This 72 y.o. 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  REFERRING PROVIDER: Rayetta Humphrey, MD  HPI: Patient is a 72 year old female now out 6 months having completed palliative radiation therapy to her right chest wall for skin metastasis status postmastectomy for stage IIIc ER/PR positive base of mammary carcinoma.  Seen today in routine follow-up she is doing fairly well.  Patient was noncompliant during treatments.  She does seem to have possibly some persistent disease on the chest wall.  She has not been followed up by medical oncology..  COMPLICATIONS OF TREATMENT: none  FOLLOW UP COMPLIANCE: keeps appointments   PHYSICAL EXAM:  BP 104/68   Pulse (!) 111   Temp 98 F (36.7 C) (Tympanic)   Resp 20   Wt 112 lb (50.8 kg)   BMI 20.48 kg/m  Right chest wall has not some erythematous slightly raised nodules may be consistent with persistent disease.  Lungs are clear no axillary or supraclavicular adenopathy is identified left breast is free of dominant mass.  Well-developed well-nourished patient in NAD. HEENT reveals PERLA, EOMI, discs not visualized.  Oral cavity is clear. No oral mucosal lesions are identified. Neck is clear without evidence of cervical or supraclavicular adenopathy. Lungs are clear to A&P. Cardiac examination is essentially unremarkable with regular rate and rhythm without murmur rub or thrill. Abdomen is benign with no organomegaly or masses noted. Motor sensory and DTR levels are equal and symmetric in the upper and lower extremities. Cranial nerves II through XII are grossly intact. Proprioception is intact. No peripheral adenopathy or edema is  identified. No motor or sensory levels are noted. Crude visual fields are within normal range.  RADIOLOGY RESULTS: No current films for review  PLAN: Present time of trying to arrange her to be seen by medical oncology again.  She may be at benefit from evaluation at this time.  Otherwise have asked to see her back in 6 months for follow-up.  Would consider retreatment although she was very noncompliant and adverse to any further treatment even during radiation therapy.  I would like to take this opportunity to thank you for allowing me to participate in the care of your patient.Carmina Miller, MD

## 2022-11-04 NOTE — Progress Notes (Addendum)
BH MD/PA/NP OP Progress Note  11/11/2022 11:07 AM Elizabeth [name]  MRN:  [id number]  Chief Complaint:  Chief Complaint  Patient presents with   Follow-up   HPI:  This is a follow-up appointment for bipolar disorder and insomnia. She states that she has been doing fine.  When she was asked about her hair color, she smiles, and states that it was red, and not pink.  She agrees that she feels good to visit.  Day care program comes twice a week.  Although she still does not like them, she feels comfortable.  She is also trying to clean up the house.  She feels weak, and she felt twice Onalee Hua helps to elaborate that she did not have any fall since the lat visit).  She enjoys watching TV.  She feels that things "turn out to be nothing." Although she does not elaborate it, she answers "yes" when she is asked if she meant that nothing helps her.  She goes to church every Sunday .  She feels calmer there.  She denies SI.  When she was asked about antichrist, she states that it is "the same," and she looks like an antichrist.  She complains of initial insomnia.  She drinks Dr. Reino Kent 4 cans a day.   Onalee Hua, her son presents to the visit.  Although there was an incident of concern about her medication, it is fixed now and she has been taking it regularly.  He thinks her irritability has been improving, and going in a good direction.  He denies concern otherwise.    Wt Readings from Last 3 Encounters:  11/11/22 115 lb 6.4 oz (52.3 kg)  10/31/22 112 lb (50.8 kg)  09/16/22 111 lb 3.2 oz (50.4 kg)     Visit Diagnosis:    ICD-10-CM   1. Bipolar affective disorder, currently depressed, moderate (HCC)  F31.32     2. Insomnia, unspecified type  G47.00       Past Psychiatric History: Please see initial evaluation for full details. I have reviewed the history. No updates at this time.     Past Medical History:  Past Medical History:  Diagnosis Date   Acute cholecystitis 2016   Anemia    Anemia  2016   Anxiety    panic attacks   Anxiety and depression    Bipolar 1 disorder (HCC)    Bowel obstruction (HCC) 2017   Common bile duct dilatation 2015   Dyspnea    Edema    Fibromyalgia    GI bleed 2017   History of kidney stones 01/21/2009   HSV-1 infection    Hyperglycemia    Hyperlipidemia    Hypokalemia    Hypothyroidism    Liver cyst    Malignant neoplasm of right female breast, unspecified estrogen receptor status, unspecified site of breast (HCC)    Memory loss    Osteopenia    Panic disorder    Schizophrenia (HCC)    Ventral hernia     Past Surgical History:  Procedure Laterality Date   ABDOMINAL HYSTERECTOMY     BREAST BIOPSY Right 01/19/2021   [place] Bx, ribbon, path pending   BREAST BIOPSY Right 01/19/2021   [place] Bx, Venus, path pending   BREAST BIOPSY Right 01/19/2021   [place] Bx, heart, path pending   BREAST BIOPSY Right 01/19/2021   [place] BX Axilla, Coil, path pending   CESAREAN SECTION     CHOLECYSTECTOMY OPEN  11/17/2013   with repair of bile duct-Dr.  Bird   MASTECTOMY MODIFIED RADICAL Right 03/02/2021   Procedure: MASTECTOMY MODIFIED RADICAL;  Surgeon: Sung Amabile, DO;  Location: [facility];  Service: General;  Laterality: Right;   OPEN REDUCTION INTERNAL FIXATION (ORIF) DISTAL RADIAL FRACTURE Right 12/20/2014   Procedure: OPEN REDUCTION INTERNAL FIXATION (ORIF) RIGHT DISTAL RADIAL FRACTURE;  Surgeon: Sheral Apley, MD;  Location: [facility];  Service: Orthopedics;  Laterality: Right;   STOMACH SURGERY      Family Psychiatric History: Please see initial evaluation for full details. I have reviewed the history. No updates at this time.    Family History:  Family History  Problem Relation Age of Onset   Heart disease Father    Breast cancer Paternal Aunt    Kidney disease Paternal Aunt    Breast cancer Paternal Grandmother    Asthma Daughter    Bladder Cancer Neg Hx     Social History:  Social History   Socioeconomic History   Marital  status: Married    Spouse name: Not on file   Number of children: 1   Years of education: Not on file   Highest education level: Not on file  Occupational History   Not on file  Tobacco Use   Smoking status: Former    Current packs/day: 0.00    Types: Cigarettes    Quit date: 11/03/1973    Years since quitting: 49.0    Passive exposure: Past   Smokeless tobacco: Never   Tobacco comments:    Smoked briefly as a teen- lives in secondhand smoke.   Vaping Use   Vaping status: Never Used  Substance and Sexual Activity   Alcohol use: No    Alcohol/week: 0.0 standard drinks of alcohol   Drug use: No   Sexual activity: Not Currently  Other Topics Concern   Not on file  Social History Narrative   Not on file   Social Determinants of Health   Financial Resource Strain: Low Risk  (08/20/2022)   Received from [facility]   Overall Financial Resource Strain (CARDIA)    Difficulty of Paying Living Expenses: Not hard at all  Food Insecurity: No Food Insecurity (08/20/2022)   [facility]   Hunger Vital Sign    Worried About Running Out of Food in the Last Year: Never true    Ran Out of Food in the Last Year: Never true  Transportation Needs: No Transportation Needs (08/20/2022)   Received from [facility] - Transportation    In the past 12 months, has lack of transportation kept you from medical appointments or from getting medications?: No    Lack of Transportation (Non-Medical): No  Physical Activity: Insufficiently Active (08/06/2021)   Received from [facility], [facility]   Exercise Vital Sign    Days of Exercise per Week: 2 days    Minutes of Exercise per Session: 30 min  Stress: No Stress Concern Present (08/27/2021)   Received from [facility], [facility]   [employer] of Occupational Health - Occupational Stress  Questionnaire    Feeling of Stress : Only a little  Social Connections: Socially Isolated (07/31/2021)   Received from [facility], [facility]   Social Connection and Isolation Panel [NHANES]    Frequency of Communication with Friends and Family: More than three times a week    Frequency of Social Gatherings with  Friends and Family: Three times a week    Attends Religious Services: Never    Active Member of Clubs or Organizations: No    Attends Banker Meetings: Never    Marital Status: Widowed    Allergies:  Allergies  Allergen Reactions   Penicillins Hives    TOLERATED CEFAZOLIN    Metabolic Disorder Labs: No results found for: "HGBA1C", "MPG" No results found for: "PROLACTIN" No results found for: "CHOL", "TRIG", "HDL", "CHOLHDL", "VLDL", "LDLCALC" No results found for: "TSH"  Therapeutic Level Labs: No results found for: "LITHIUM" Lab Results  Component Value Date   VALPROATE <10 (L) 07/18/2021   No results found for: "CBMZ"  Current Medications: Current Outpatient Medications  Medication Sig Dispense Refill   acetaminophen (TYLENOL) 325 MG tablet Take 2 tablets (650 mg total) by mouth every 6 (six) hours as needed for mild pain, fever or headache.     alendronate (FOSAMAX) 70 MG tablet Take 1 tablet by mouth every Monday.     ferrous sulfate 325 (65 FE) MG tablet Take 1 tablet by mouth daily with breakfast.     letrozole (FEMARA) 2.5 MG tablet Take 1 tablet (2.5 mg total) by mouth daily. 90 tablet 3   letrozole (FEMARA) 2.5 MG tablet Take 1 tablet (2.5 mg total) by mouth daily. 30 tablet 0   levothyroxine (SYNTHROID, LEVOTHROID) 88 MCG tablet Take 88 mcg by mouth daily before breakfast.     lidocaine (LMX) 4 % cream Apply 1 Application topically as needed. 30 g 0   midodrine (PROAMATINE) 10 MG tablet Take 1 tablet (10 mg total) by mouth 3 (three) times daily with meals. 90 tablet 1   midodrine (PROAMATINE) 10 MG tablet  Take by mouth 3 (three) times daily.     QUEtiapine (SEROQUEL) 25 MG tablet Take 1 tablet (25 mg total) by mouth at bedtime. 90 tablet 0   sertraline (ZOLOFT) 50 MG tablet Take 1.5 tablets (75 mg total) by mouth at bedtime. 135 tablet 0   Vitamin D, Ergocalciferol, (DRISDOL) 1.25 MG (50000 UNIT) CAPS capsule Take 50,000 Units by mouth once a week.     No current facility-administered medications for this visit.     Musculoskeletal: Strength & Muscle Tone: within normal limits Gait & Station:  uses a walker Patient leans: N/A  Psychiatric Specialty Exam: Review of Systems  Psychiatric/Behavioral:  Positive for dysphoric mood and sleep disturbance. Negative for agitation, behavioral problems, confusion, decreased concentration, hallucinations, self-injury and suicidal ideas. The patient is nervous/anxious. The patient is not hyperactive.   All other systems reviewed and are negative.   Blood pressure 112/67, pulse (!) 101, temperature (!) 97 F (36.1 C), temperature source Skin, height 5' 2.01" (1.575 m), weight 115 lb 6.4 oz (52.3 kg).Body mass index is 21.1 kg/m.  General Appearance: Well Groomed  Eye Contact:  Fair  Speech:  Clear and Coherent  Volume:  Normal  Mood:   fine  Affect:  Appropriate, Congruent, Restricted, and smiles at times  Thought Process:  Coherent  Orientation:  Full (Time, Place, and Person)  Thought Content: Logical   Suicidal Thoughts:  No  Homicidal Thoughts:  No  Memory:  Immediate;   Good  Judgement:  Good  Insight:  Shallow  Psychomotor Activity:  Normal, Normal tone, no rigidity, no resting/postural tremors, no tardive dyskinesia    Concentration:  Concentration: Good and Attention Span: Good  Recall:  Good  Fund of Knowledge: Good  Language: Good  Akathisia:  No  Handed:  Right  AIMS (if indicated): not done  Assets:  Communication Skills Desire for Improvement  ADL's:  Intact  Cognition: WNL  Sleep:  Fair   Screenings: GAD-7     Flowsheet Row Office Visit from 07/22/2022 in [facility] Office Visit from 02/25/2022 in [facility] Office Visit from 12/31/2021 in [facility] Office Visit from 11/05/2021 in [facility]  Total GAD-7 Score 21 19 18 21       PHQ2-9    Flowsheet Row Office Visit from 07/22/2022 in [facility] Office Visit from 04/22/2022 in [facility] Office Visit from 02/25/2022 in [facility] Office Visit from 12/31/2021 in [facility] Office Visit from 11/05/2021 in [facility]  PHQ-2 Total Score 6 6 6 4 2   PHQ-9 Total Score 27 22 18 16 11       Flowsheet Row ED to Hosp-Admission (Discharged) from 08/16/2022 in [facility] Office Visit from 07/22/2022 in [facility] Office Visit from 04/22/2022 in [facility]  C-SSRS RISK CATEGORY No Risk Error: Q3, 4, or 5 should not be populated when Q2 is No Error: Q3, 4, or 5 should not be populated when Q2 is No        Assessment and Plan:  OMOLOLA HUCKINS is a 72 y.o. 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     1. Bipolar affective disorder, currently depressed, moderate (HCC) R/o MDD with psychotic features  Acute stressors include: undergoing radiation treatment for breast caner s/p mastectomy Other stressors include:    History:  history of bipolar disorder (has this diagnosis according to her  son, although she has no known manic symptoms), r/o early manifestation of neuropsychiatric symptoms secondary to underlying neurocognitive disorder.   Exam is notable for occasional smile, and less rumination ego-syntonic thoughts of being antichrist, preoccupation with somatic symptoms.  According to her son, there has been overall improvement in irritability.  Although she will benefit from a higher dose of sertraline, she is adamant not to do it at this time.  Will continue current dose to target depression, anxiety.  Will continue quetiapine for mood dysregulation. Noted that although she has a history of bipolar disorder, her son has only observed symptoms of paranoia, and no known manic symptoms.   2. Insomnia, unspecified type She reports initial and middle insomnia.  Her son is unsure about this complain.  She drinks 4 cans of Dr. Reino Kent.  She agrees to try limiting the intake to avoid it in the past insomnia.  Will continue current dose of quetiapine at this time to target the above symptoms and insomnia.   R/o cognitive impairment Functional Status   IADL: Independent in the following: managing finances, medications, driving           Requires assistance with the following:  ADL  Independent in the following: bathing and hygiene, feeding, continence, grooming and toileting, walking          Requires assistance with the following: Folate, Vitamin B12, TSH Images Brain MRI 01/2022  Brain: Diffusion imaging does not show any acute or  subacute infarction. No focal abnormality affects the brainstem or cerebellum. Cerebral hemispheres show subjectively the relative absence of volume loss. There are scattered foci of T2 and FLAIR signal within the white matter consistent with mild to low moderate chronic small-vessel ischemic change. No advanced disease. No cortical or large vessel territory infarction. No mass lesion, hemorrhage, hydrocephalus or extra-axial collection. No generalized or lobar  specific volume loss. Moderate chronic small-vessel ischemic changes of the cerebral hemispheric white matter. No acute or reversible finding.             amyloid PET 05/2022.: Negative study indicating sparse or no amyloid neuritic plaque Neuropsych assessment: she was seen at [facility] clinic 12/2021. Dx r/o late onset alzheimer. MMSE Total Score 26/30 per chart. (MOCA 14 in 10/2021) Etiology:  Lewy body dementia, Alzheimer's disease, and/or depression Unchanged. I extensively reviewed chart outside of the system. PET scan was not conclusive for Alzheimer's disease.  It has been very difficult to tease out whether her current psychotic natures are only secondary to mood symptoms and or any component of neurodegenerative disorder. Her son was advised to reach out to them for ongoing evaluation at the last visit.   Plan  Continue sertraline 75 mg daily Continue quetiapine 25 mg at night (QTc HR 73, QTC 427 msec 03/2022) Next appointment: 12/17 at 10 am - consent form to have 2-way conversation with her son, Onalee Hua is on file.    The patient demonstrates the following risk factors for suicide: Chronic risk factors for suicide include: psychiatric disorder of depression . Acute risk factors for suicide include: unemployment, social withdrawal/isolation, and recent suicide attempt/discharges from inpatient psychiatry. Protective factors for this patient include: positive social support. Considering these factors, the overall suicide risk at this point appears to be moderate, but not at imminent risk. There are no guns in the house. Patient is appropriate for outpatient follow up.     Collaboration of Care: Collaboration of Care: Other reviewed notes in Epic  Patient/Guardian was advised Release of Information must be obtained prior to any record release in order to collaborate their care with an outside provider. Patient/Guardian was advised if they have not already done so to contact the registration  department to sign all necessary forms in order for [place] to release information regarding their care.   Consent: Patient/Guardian gives verbal consent for treatment and assignment of benefits for services provided during this visit. Patient/Guardian expressed understanding and agreed to proceed.    Neysa Hotter, MD 11/11/2022, 11:07 AM

## 2022-11-05 ENCOUNTER — Other Ambulatory Visit: Payer: Self-pay | Admitting: Emergency Medicine

## 2022-11-05 ENCOUNTER — Other Ambulatory Visit: Payer: Self-pay

## 2022-11-07 ENCOUNTER — Other Ambulatory Visit: Payer: Self-pay

## 2022-11-08 ENCOUNTER — Other Ambulatory Visit: Payer: Self-pay

## 2022-11-11 ENCOUNTER — Other Ambulatory Visit: Payer: Self-pay | Admitting: Oncology

## 2022-11-11 ENCOUNTER — Other Ambulatory Visit: Payer: Self-pay

## 2022-11-11 ENCOUNTER — Telehealth: Payer: Self-pay | Admitting: *Deleted

## 2022-11-11 ENCOUNTER — Ambulatory Visit (INDEPENDENT_AMBULATORY_CARE_PROVIDER_SITE_OTHER): Payer: Medicare HMO | Admitting: Psychiatry

## 2022-11-11 ENCOUNTER — Encounter: Payer: Self-pay | Admitting: Psychiatry

## 2022-11-11 VITALS — BP 112/67 | HR 101 | Temp 97.0°F | Ht 62.01 in | Wt 115.4 lb

## 2022-11-11 DIAGNOSIS — G4700 Insomnia, unspecified: Secondary | ICD-10-CM | POA: Diagnosis not present

## 2022-11-11 DIAGNOSIS — F3132 Bipolar disorder, current episode depressed, moderate: Secondary | ICD-10-CM

## 2022-11-11 MED ORDER — LETROZOLE 2.5 MG PO TABS
2.5000 mg | ORAL_TABLET | Freq: Every day | ORAL | 0 refills | Status: DC
  Filled 2022-11-11: qty 30, 30d supply, fill #0

## 2022-11-11 NOTE — Telephone Encounter (Signed)
Patient is still on Letrozole and I got call from pharmacy asking for refill. I only approved a 30 days supply as her appointment in June says she cancelled it and son said it is because she was getting radiation therapy. Patient has no further appts with Dr Orlie Dakin I told son via pharmacy that patient would need a follow up before we can refill her Letrozole again so he called and left message. To schedule follow up appt

## 2022-11-11 NOTE — Patient Instructions (Signed)
Continue sertraline 75 mg daily Continue quetiapine 25 mg at night  Next appointment: 12/17 at 10 am

## 2022-11-20 ENCOUNTER — Encounter: Payer: Self-pay | Admitting: Oncology

## 2022-11-28 ENCOUNTER — Inpatient Hospital Stay

## 2022-11-28 ENCOUNTER — Inpatient Hospital Stay: Attending: Oncology | Admitting: Oncology

## 2022-11-28 ENCOUNTER — Encounter: Payer: Self-pay | Admitting: Oncology

## 2022-11-28 VITALS — BP 103/64 | HR 93 | Temp 96.0°F | Resp 16 | Ht 62.01 in | Wt 114.0 lb

## 2022-11-28 DIAGNOSIS — I890 Lymphedema, not elsewhere classified: Secondary | ICD-10-CM | POA: Diagnosis not present

## 2022-11-28 DIAGNOSIS — Z170 Estrogen receptor positive status [ER+]: Secondary | ICD-10-CM | POA: Diagnosis not present

## 2022-11-28 DIAGNOSIS — Z803 Family history of malignant neoplasm of breast: Secondary | ICD-10-CM | POA: Diagnosis not present

## 2022-11-28 DIAGNOSIS — Z79811 Long term (current) use of aromatase inhibitors: Secondary | ICD-10-CM | POA: Insufficient documentation

## 2022-11-28 DIAGNOSIS — C50911 Malignant neoplasm of unspecified site of right female breast: Secondary | ICD-10-CM | POA: Diagnosis not present

## 2022-11-28 DIAGNOSIS — Z1721 Progesterone receptor positive status: Secondary | ICD-10-CM | POA: Insufficient documentation

## 2022-11-28 DIAGNOSIS — Z1732 Human epidermal growth factor receptor 2 negative status: Secondary | ICD-10-CM | POA: Diagnosis not present

## 2022-11-28 DIAGNOSIS — Z87891 Personal history of nicotine dependence: Secondary | ICD-10-CM | POA: Insufficient documentation

## 2022-11-28 DIAGNOSIS — Z90710 Acquired absence of both cervix and uterus: Secondary | ICD-10-CM | POA: Diagnosis not present

## 2022-11-28 MED ORDER — LETROZOLE 2.5 MG PO TABS: 2.5000 mg | ORAL_TABLET | Freq: Every day | ORAL | 3 refills | Status: DC

## 2022-11-28 NOTE — Progress Notes (Signed)
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  INTERVAL HISTORY: Patient last Elizabeth [name] in clinic on January 25, 2022.  She returns to clinic today for evaluation and continuation of letrozole.  She has completed all of her XRT.  She currently feels well and is tolerating treatment without significant side effects.  She continues to have a poor memory and much of the history is given by her son who was on the phone and her caretaker in the room.  She has no neurologic complaints.  She denies any recent fevers or illnesses.  She has a good appetite and denies weight loss.  She has no chest pain, shortness of breath, cough, or hemoptysis.  She denies any nausea, vomiting, constipation, or diarrhea.  She has no urinary complaints.  Patient offers no specific complaints today.  REVIEW OF SYSTEMS:   Review of Systems  Constitutional: Negative.  Negative for fever, malaise/fatigue and weight loss.  Respiratory: Negative.  Negative for cough, hemoptysis and shortness of breath.   Cardiovascular: Negative.  Negative for chest pain and leg swelling.  Gastrointestinal: Negative.  Negative for abdominal pain.  Genitourinary: Negative.  Negative for dysuria.  Musculoskeletal: Negative.  Negative for back pain.  Skin: Negative.  Negative for rash.  Neurological: Negative.  Negative for dizziness, sensory change, focal weakness, weakness and headaches.  Psychiatric/Behavioral:  The patient is not nervous/anxious.     As per HPI. Otherwise, a complete review of systems is negative.  PAST MEDICAL HISTORY: Past Medical  History:  Diagnosis Date   Acute cholecystitis 2016   Anemia    Anemia 2016   Anxiety    panic attacks   Anxiety and depression    Bipolar 1 disorder (HCC)    Bowel obstruction (HCC) 2017   Common bile duct dilatation 2015   Dyspnea    Edema    Fibromyalgia    GI bleed 2017   History of kidney stones 01/21/2009   HSV-1 infection    Hyperglycemia    Hyperlipidemia    Hypokalemia    Hypothyroidism    Liver cyst    Malignant neoplasm of right female breast, unspecified estrogen receptor status, unspecified site of breast (HCC)    Memory loss    Osteopenia    Panic disorder    Schizophrenia (HCC)    Ventral hernia     PAST SURGICAL HISTORY: Past Surgical History:  Procedure Laterality Date   ABDOMINAL HYSTERECTOMY     BREAST BIOPSY Right 01/19/2021   [place] Bx, ribbon, path pending   BREAST BIOPSY Right 01/19/2021   [place] Bx, Venus, path pending   BREAST BIOPSY Right 01/19/2021   [place] Bx, heart, path pending   BREAST BIOPSY Right 01/19/2021   [place] BX Axilla, Coil, path pending   CESAREAN SECTION     CHOLECYSTECTOMY OPEN  11/17/2013   with repair of bile duct-Dr. Egbert Garibaldi   MASTECTOMY MODIFIED RADICAL Right 03/02/2021   Procedure: MASTECTOMY MODIFIED RADICAL;  Surgeon: Sung Amabile, DO;  Location: [facility];  Service: General;  Laterality: Right;   OPEN REDUCTION INTERNAL FIXATION (ORIF) DISTAL RADIAL FRACTURE Right 12/20/2014   Procedure: OPEN REDUCTION INTERNAL FIXATION (ORIF) RIGHT DISTAL  RADIAL FRACTURE;  Surgeon: Sheral Apley, MD;  Location: [facility];  Service: Orthopedics;  Laterality: Right;   STOMACH SURGERY      FAMILY HISTORY: Family History  Problem Relation Age of Onset   Heart disease Father    Breast cancer Paternal Aunt    Kidney disease Paternal Aunt    Breast cancer Paternal Grandmother    Asthma Daughter    Bladder Cancer Neg Hx     ADVANCED DIRECTIVES (Y/N):  N  HEALTH MAINTENANCE: Social History   Tobacco Use   Smoking status:  Former    Current packs/day: 0.00    Types: Cigarettes    Quit date: 11/03/1973    Years since quitting: 49.1    Passive exposure: Past   Smokeless tobacco: Never   Tobacco comments:    Smoked briefly as a teen- lives in secondhand smoke.   Vaping Use   Vaping status: Never Used  Substance Use Topics   Alcohol use: No    Alcohol/week: 0.0 standard drinks of alcohol   Drug use: No     Colonoscopy:  PAP:  Bone density:  Lipid panel:  Allergies  Allergen Reactions   Penicillins Hives    TOLERATED CEFAZOLIN    Current Outpatient Medications  Medication Sig Dispense Refill   ferrous sulfate 325 (65 FE) MG tablet Take 1 tablet by mouth daily with breakfast.     levothyroxine (SYNTHROID, LEVOTHROID) 88 MCG tablet Take 88 mcg by mouth daily before breakfast.     QUEtiapine (SEROQUEL) 25 MG tablet Take 1 tablet (25 mg total) by mouth at bedtime. 90 tablet 0   sertraline (ZOLOFT) 50 MG tablet Take 1.5 tablets (75 mg total) by mouth at bedtime. 135 tablet 0   Vitamin D, Ergocalciferol, (DRISDOL) 1.25 MG (50000 UNIT) CAPS capsule Take 50,000 Units by mouth once a week.     acetaminophen (TYLENOL) 325 MG tablet Take 2 tablets (650 mg total) by mouth every 6 (six) hours as needed for mild pain, fever or headache. (Patient not taking: Reported on 11/28/2022)     alendronate (FOSAMAX) 70 MG tablet Take 1 tablet by mouth every Monday. (Patient not taking: Reported on 11/28/2022)     letrozole (FEMARA) 2.5 MG tablet Take 1 tablet (2.5 mg total) by mouth daily. 90 tablet 3   lidocaine (LMX) 4 % cream Apply 1 Application topically as needed. (Patient not taking: Reported on 11/28/2022) 30 g 0   midodrine (PROAMATINE) 10 MG tablet Take 1 tablet (10 mg total) by mouth 3 (three) times daily with meals. (Patient not taking: Reported on 11/28/2022) 90 tablet 1   midodrine (PROAMATINE) 10 MG tablet Take by mouth 3 (three) times daily. (Patient not taking: Reported on 11/28/2022)     No current  facility-administered medications for this visit.    OBJECTIVE: Vitals:   11/28/22 0921  BP: 103/64  Pulse: 93  Resp: 16  Temp: (!) 96 F (35.6 C)  SpO2: 98%     Body mass index is 20.84 kg/m.    ECOG FS:0 - Asymptomatic  General: Well-developed, well-nourished, no acute distress. Eyes: Pink conjunctiva, anicteric sclera. HEENT: Normocephalic, moist mucous membranes. Lungs: No audible wheezing or coughing. Heart: Regular rate and rhythm. Abdomen: Soft, nontender, no obvious distention. Musculoskeletal: No edema, cyanosis, or clubbing. Neuro: Alert, answering all questions appropriately. Cranial nerves grossly intact. Skin: No rashes or petechiae noted. Psych: Flat affect.    LAB RESULTS:  Lab Results  Component Value Date  NA 138 08/17/2022   K 3.9 08/17/2022   CL 108 08/17/2022   CO2 24 08/17/2022   GLUCOSE 86 08/17/2022   BUN 15 08/17/2022   CREATININE 0.72 08/17/2022   CALCIUM 7.9 (L) 08/17/2022   PROT 6.7 08/16/2022   ALBUMIN 3.0 (L) 08/16/2022   AST 18 08/16/2022   ALT 14 08/16/2022   ALKPHOS 97 08/16/2022   BILITOT 0.5 08/16/2022   GFRNONAA >60 08/17/2022   GFRAA 28 (L) 12/18/2015    Lab Results  Component Value Date   WBC 3.6 (L) 08/17/2022   NEUTROABS 3.3 08/16/2022   HGB 9.1 (L) 08/17/2022   HCT 26.9 (L) 08/17/2022   MCV 93.1 08/17/2022   PLT 277 08/17/2022     STUDIES: No results found.  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  Final pathology reviewed and patient noted to have 11 out of 11 lymph nodes positive for disease.   Despite recommendations patient refused adjuvant chemotherapy, XRT, or letrozole at that time. CT of the chest, abdomen, and pelvis completed on January 09, 2022 reviewed independently revealing only local  recurrence of disease with possible right axillary lymph node involvement.  No metastatic disease was noted.  Recommended pursuing radiation therapy for local control disease. Although she ultimately agreed to XRT.  Patient was initially placed on letrozole and was noncompliant with treatment.  She also is to several appointments in the interim.  By report, she has been taking her letrozole without any issues.    Systemic therapy, IV or oral, is not recommended.  Continue letrozole indefinitely.  Return to clinic in 6 months for routine evaluation.  Patient will require a bone marrow density in the near future. Lymphedema: Chronic and unchanged.  I spent a total of 30 minutes reviewing chart data, face-to-face evaluation with the patient, counseling and coordination of care as detailed above.    Patient expressed understanding and was in agreement with this plan. She also understands that She can call clinic at any time with any questions, concerns, or complaints.    Cancer Staging  Invasive ductal carcinoma of right breast [facility]) Staging form: Breast, AJCC 8th Edition - Pathologic stage from 03/14/2021: Stage IIIB (pT2, pN3a, cM0, G3, ER+, PR+, HER2-) - Signed by Jeralyn Ruths, MD on 03/14/2021 Stage prefix: Initial diagnosis Histologic grading system: 3 grade system   Jeralyn Ruths, MD   11/28/2022 4:20 PM

## 2022-12-24 ENCOUNTER — Ambulatory Visit: Payer: Medicare HMO | Admitting: Urology

## 2022-12-24 VITALS — BP 119/73 | HR 87 | Ht 62.01 in | Wt 117.2 lb

## 2022-12-24 DIAGNOSIS — R350 Frequency of micturition: Secondary | ICD-10-CM | POA: Diagnosis not present

## 2022-12-24 DIAGNOSIS — R3129 Other microscopic hematuria: Secondary | ICD-10-CM | POA: Diagnosis not present

## 2022-12-24 LAB — MICROSCOPIC EXAMINATION

## 2022-12-24 LAB — URINALYSIS, COMPLETE
Bilirubin, UA: NEGATIVE
Glucose, UA: NEGATIVE
Ketones, UA: NEGATIVE
Leukocytes,UA: NEGATIVE
Nitrite, UA: NEGATIVE
Protein,UA: NEGATIVE
Specific Gravity, UA: <1.005 — ABNORMAL LOW (ref 1.005–1.030)
Urobilinogen, Ur: 0.2 mg/dL (ref 0.2–1.0)
pH, UA: 6 (ref 5.0–7.5)

## 2022-12-24 LAB — BLADDER SCAN AMB NON-IMAGING: Scan Result: 89

## 2022-12-24 NOTE — Progress Notes (Signed)
I,Amy L Pierron,acting as a scribe for Vanna Scotland, MD.,have documented all relevant documentation on the behalf of Vanna Scotland, MD,as directed by  Vanna Scotland, MD while in the presence of Vanna Scotland, MD.  12/24/2022 2:22 PM   Elizabeth [name] [birth year] [id number]  Referring provider: Rayetta Humphrey, MD [address] [place],  [place] [address]  Chief Complaint  Patient presents with   Establish Care   Hematuria    HPI: 72 year-old female presents today for further evaluation of possible microscopic hematuria.  Notably she was [name] back in September with concern for abnormal urine color. At the time her urinalysis was positive only for blood on dip. There was no microscopic associated. The urine culture was negative, other than some mixed urogenital flora. She was referred to urology at the time. She doesn't have any documented microscopic hematuria.  She did have a CT stone protocol back in January of 2023 after a fall. She had a benign appearing right adrenal adenoma, a left renal cyst, otherwise no GU pathology appreciated. No contrast or delayed imaging on that study.  She has a personal history of metastatic breast cancer and is on Letrozole.   She has three to ten red blood cells per high powered field on her urinalysis today.  She is accompanied today with a caregiver.  She mentions having brown urine and has burning with urination. The burning occurs after she is done urinating as well.   She mostly only drinks Dr. Reino Kent.   Results for orders placed or performed in visit on 12/24/22  Bladder Scan (Post Void Residual) in office  Result Value Ref Range   Scan Result 89 ml      PMH: Past Medical History:  Diagnosis Date   Acute cholecystitis 2016   Anemia    Anemia 2016   Anxiety    panic attacks   Anxiety and depression    Bipolar 1 disorder (HCC)    Bowel obstruction (HCC) 2017   Common bile duct dilatation 2015   Dyspnea    Edema     Fibromyalgia    GI bleed 2017   History of kidney stones 01/21/2009   HSV-1 infection    Hyperglycemia    Hyperlipidemia    Hypokalemia    Hypothyroidism    Liver cyst    Malignant neoplasm of right female breast, unspecified estrogen receptor status, unspecified site of breast (HCC)    Memory loss    Osteopenia    Panic disorder    Schizophrenia (HCC)    Ventral hernia     Surgical History: Past Surgical History:  Procedure Laterality Date   ABDOMINAL HYSTERECTOMY     BREAST BIOPSY Right 01/19/2021   [place] Bx, ribbon, path pending   BREAST BIOPSY Right 01/19/2021   [place] Bx, Venus, path pending   BREAST BIOPSY Right 01/19/2021   [place] Bx, heart, path pending   BREAST BIOPSY Right 01/19/2021   [place] BX Axilla, Coil, path pending   CESAREAN SECTION     CHOLECYSTECTOMY OPEN  11/17/2013   with repair of bile duct-Dr. Egbert Garibaldi   MASTECTOMY MODIFIED RADICAL Right 03/02/2021   Procedure: MASTECTOMY MODIFIED RADICAL;  Surgeon: Sung Amabile, DO;  Location: [facility];  Service: General;  Laterality: Right;   OPEN REDUCTION INTERNAL FIXATION (ORIF) DISTAL RADIAL FRACTURE Right 12/20/2014   Procedure: OPEN REDUCTION INTERNAL FIXATION (ORIF) RIGHT DISTAL RADIAL FRACTURE;  Surgeon: Sheral Apley, MD;  Location: [facility];  Service: Orthopedics;  Laterality: Right;   STOMACH SURGERY      Home Medications:  Allergies as of 12/24/2022       Reactions   Penicillins Hives   TOLERATED CEFAZOLIN        Medication List        Accurate as of December 24, 2022  2:22 PM. If you have any questions, ask your nurse or doctor.          acetaminophen 325 MG tablet Commonly known as: TYLENOL Take 2 tablets (650 mg total) by mouth every 6 (six) hours as needed for mild pain, fever or headache.   alendronate 70 MG tablet Commonly known as: FOSAMAX Take 1 tablet by mouth every Monday.   ferrous sulfate 325 (65 FE) MG tablet Take 1 tablet by mouth daily with breakfast.   letrozole  2.5 MG tablet Commonly known as: FEMARA Take 1 tablet (2.5 mg total) by mouth daily.   levothyroxine 88 MCG tablet Commonly known as: SYNTHROID Take 88 mcg by mouth daily before breakfast.   lidocaine 4 % cream Commonly known as: LMX Apply 1 Application topically as needed.   midodrine 10 MG tablet Commonly known as: PROAMATINE Take by mouth 3 (three) times daily.   midodrine 10 MG tablet Commonly known as: PROAMATINE Take 1 tablet (10 mg total) by mouth 3 (three) times daily with meals.   QUEtiapine 25 MG tablet Commonly known as: SEROQUEL Take 1 tablet (25 mg total) by mouth at bedtime.   sertraline 50 MG tablet Commonly known as: ZOLOFT Take 1.5 tablets (75 mg total) by mouth at bedtime.   Vitamin D (Ergocalciferol) 1.25 MG (50000 UNIT) Caps capsule Commonly known as: DRISDOL Take 50,000 Units by mouth once a week.        Allergies:  Allergies  Allergen Reactions   Penicillins Hives    TOLERATED CEFAZOLIN    Family History: Family History  Problem Relation Age of Onset   Heart disease Father    Breast cancer Paternal Aunt    Kidney disease Paternal Aunt    Breast cancer Paternal Grandmother    Asthma Daughter    Bladder Cancer Neg Hx     Social History:  reports that she quit smoking about 49 years ago. Her smoking use included cigarettes. She has been exposed to tobacco smoke. She has never used smokeless tobacco. She reports that she does not drink alcohol and does not use drugs.   Physical Exam: BP 119/73   Pulse 87   Ht 5' 2.01" (1.575 m)   Wt 117 lb 4 oz (53.2 kg)   BMI 21.44 kg/m   Constitutional:  Alert and oriented, No acute distress. HEENT: [place] AT, moist mucus membranes.  Trachea midline, no masses. Neurologic: Grossly intact, no focal deficits, moving all 4 extremities. Psychiatric: Normal mood and affect.   Assessment & Plan:    1. Microscopic hematuria  - Plan for cystoscopy.  -Differential diagnosis was reviewed in  detail -Suspect underlying causes related to atrophy as outlined below however recommend cystoscopy to rule out underlying bladder pathology to which she and her caregiver are agreeable -Will hold off on additional upper tract imaging at this time given that she has had multiple recent scans.  The risk of upper tract pathology is relatively low.  She agrees with this via shared decision making conversation today.  2. Dysuria  - Likely a result of Letrozole. Explained how this medication removes the estrogen and dries out the area around the urethra and  vagina resulting in dysuria/ burning after urination. Urine in acidic so when it touches the skin it can hurt and/or burn. It may also cause hematuria.   - Recommend some over the counter vaginal lubricant such as Replens. Topical estrogen relatively contraindicated due to breast cancer. Also strongly encouraged increased water intake and less Dr. Reino Kent consumption. Do not recommend wearing a diaper due to possible risk of yeast infections from chronic moisture.  Return in about 4 weeks (around 01/21/2023) for cystoscopy.  [facility] [address] [place], [place] [address] [phone]

## 2022-12-24 NOTE — Patient Instructions (Signed)
Try Replens over the counter   Cystoscopy Cystoscopy is a procedure that is used to help diagnose and sometimes treat conditions that affect the lower urinary tract. The lower urinary tract includes the bladder and the urethra. The urethra is the tube that drains urine from the bladder. Cystoscopy is done using a thin, tube-shaped instrument with a light and camera at the end (cystoscope). The cystoscope may be hard or flexible, depending on the goal of the procedure. The cystoscope is inserted through the urethra, into the bladder. Cystoscopy may be recommended if you have: Urinary tract infections that keep coming back. Blood in the urine (hematuria). An inability to control when you urinate (urinary incontinence) or an overactive bladder. Unusual cells found in a urine sample. A blockage in the urethra, such as a urinary stone. Painful urination. An abnormality in the bladder found during an intravenous pyelogram (IVP) or CT scan. What are the risks? Generally, this is a safe procedure. However, problems may occur, including: Infection. Bleeding.  What happens during the procedure?  You will be given one or more of the following: A medicine to numb the area (local anesthetic). The area around the opening of your urethra will be cleaned. The cystoscope will be passed through your urethra into your bladder. Germ-free (sterile) fluid will flow through the cystoscope to fill your bladder. The fluid will stretch your bladder so that your health care provider can clearly examine your bladder walls. Your doctor will look at the urethra and bladder. The cystoscope will be removed The procedure may vary among health care providers  What can I expect after the procedure? After the procedure, it is common to have: Some soreness or pain in your urethra. Urinary symptoms. These include: Mild pain or burning when you urinate. Pain should stop within a few minutes after you urinate. This may last  for up to a few days after the procedure. A small amount of blood in your urine for several days. Feeling like you need to urinate but producing only a small amount of urine. Follow these instructions at home: General instructions Return to your normal activities as told by your health care provider.  Drink plenty of fluids after the procedure. Keep all follow-up visits as told by your health care provider. This is important. Contact a health care provider if you: Have pain that gets worse or does not get better with medicine, especially pain when you urinate lasting longer than 72 hours after the procedure. Have trouble urinating. Get help right away if you: Have blood clots in your urine. Have a fever or chills. Are unable to urinate. Summary Cystoscopy is a procedure that is used to help diagnose and sometimes treat conditions that affect the lower urinary tract. Cystoscopy is done using a thin, tube-shaped instrument with a light and camera at the end. After the procedure, it is common to have some soreness or pain in your urethra. It is normal to have blood in your urine after the procedure.  If you were prescribed an antibiotic medicine, take it as told by your health care provider.  This information is not intended to replace advice given to you by your health care provider. Make sure you discuss any questions you have with your health care provider. Document Revised: 12/30/2017 Document Reviewed: 12/30/2017 Elsevier Patient Education  2020 [employer].

## 2023-01-04 NOTE — Progress Notes (Unsigned)
BH MD/PA/NP OP Progress Note  01/07/2023 10:50 AM Elizabeth [name]  MRN:  [id number]  Chief Complaint:  Chief Complaint  Patient presents with   Follow-up   HPI:  This is a follow-up appointment for mood disorder, insomnia, neurocognitive disorder.  She states that she has been a bunch of pills and not taking it anymore.  When she was asked to clarify, she states that she has been taking medication as prescribed.  She sleeps only for a few hours and not taking any nap.  She spends time, watching TV, or going to friendship program.  She had a good birthday with the people there.  She will just try to go with the flow.  She feels depressed as her family is not around her.  [place] lately [place]  Caregivers are not around her.  She reports concern about having 3 moles all over her body.  She will get it checked by the provider.  When she is asked about antichrist, she states that she feels the same.  She show with her shoulder, stating that she has bumps on her arms on top, which is protruding.  Which shows that she is antichrist.  She denies concern about anxiety.  She denies paranoia, hallucinations.   Onalee Hua, her son presents to the visit.  He states that they found out she put piles of medication in a box, which he thinks is to get attention.  Medication is now locked, and caregiver gives them to her regularly.  She had a fall 2 weeks ago when she lost the balance when she tried to turn around.  It has not happened since then.  Although there was some negative interaction with the caregiver, she has not expressed much anger or explosive behavior.  He is unsure if she has insomnia, although she appears to lay in the bed at least.  Although she continues to talk about antichrist , he does not think it will change . no other concern.    Wt Readings from Last 3 Encounters:  01/07/23 117 lb 6.4 oz (53.3 kg)  12/24/22 117 lb 4 oz (53.2 kg)  11/28/22 114 lb (51.7 kg)     Visit  Diagnosis:    ICD-10-CM   1. Bipolar affective disorder, currently depressed, moderate (HCC)  F31.32     2. Insomnia, unspecified type  G47.00       Past Psychiatric History: Please see initial evaluation for full details. I have reviewed the history. No updates at this time.     Past Medical History:  Past Medical History:  Diagnosis Date   Acute cholecystitis 2016   Anemia    Anemia 2016   Anxiety    panic attacks   Anxiety and depression    Bipolar 1 disorder (HCC)    Bowel obstruction (HCC) 2017   Common bile duct dilatation 2015   Dyspnea    Edema    Fibromyalgia    GI bleed 2017   History of kidney stones 01/21/2009   HSV-1 infection    Hyperglycemia    Hyperlipidemia    Hypokalemia    Hypothyroidism    Liver cyst    Malignant neoplasm of right female breast, unspecified estrogen receptor status, unspecified site of breast (HCC)    Memory loss    Osteopenia    Panic disorder    Schizophrenia (HCC)    Ventral hernia     Past Surgical History:  Procedure Laterality Date   ABDOMINAL HYSTERECTOMY  BREAST BIOPSY Right 01/19/2021   [place] Bx, ribbon, path pending   BREAST BIOPSY Right 01/19/2021   [place] Bx, Venus, path pending   BREAST BIOPSY Right 01/19/2021   [place] Bx, heart, path pending   BREAST BIOPSY Right 01/19/2021   [place] BX Axilla, Coil, path pending   CESAREAN SECTION     CHOLECYSTECTOMY OPEN  11/17/2013   with repair of bile duct-Dr. Egbert Garibaldi   MASTECTOMY MODIFIED RADICAL Right 03/02/2021   Procedure: MASTECTOMY MODIFIED RADICAL;  Surgeon: Sung Amabile, DO;  Location: [facility];  Service: General;  Laterality: Right;   OPEN REDUCTION INTERNAL FIXATION (ORIF) DISTAL RADIAL FRACTURE Right 12/20/2014   Procedure: OPEN REDUCTION INTERNAL FIXATION (ORIF) RIGHT DISTAL RADIAL FRACTURE;  Surgeon: Sheral Apley, MD;  Location: [facility];  Service: Orthopedics;  Laterality: Right;   STOMACH SURGERY      Family Psychiatric History: Please see initial  evaluation for full details. I have reviewed the history. No updates at this time.     Family History:  Family History  Problem Relation Age of Onset   Heart disease Father    Breast cancer Paternal Aunt    Kidney disease Paternal Aunt    Breast cancer Paternal Grandmother    Asthma Daughter    Bladder Cancer Neg Hx     Social History:  Social History   Socioeconomic History   Marital status: Married    Spouse name: Not on file   Number of children: 1   Years of education: Not on file   Highest education level: Not on file  Occupational History   Not on file  Tobacco Use   Smoking status: Former    Current packs/day: 0.00    Types: Cigarettes    Quit date: 11/03/1973    Years since quitting: 49.2    Passive exposure: Past   Smokeless tobacco: Never   Tobacco comments:    Smoked briefly as a teen- lives in secondhand smoke.   Vaping Use   Vaping status: Never Used  Substance and Sexual Activity   Alcohol use: No    Alcohol/week: 0.0 standard drinks of alcohol   Drug use: No   Sexual activity: Not Currently  Other Topics Concern   Not on file  Social History Narrative   Not on file   Social Drivers of Health   Financial Resource Strain: Low Risk  (08/20/2022)   Received from [facility]   Overall Financial Resource Strain (CARDIA)    Difficulty of Paying Living Expenses: Not hard at all  Food Insecurity: No Food Insecurity (08/20/2022)   Received from [facility]   Hunger Vital Sign    Worried About Running Out of Food in the Last Year: Never true    Ran Out of Food in the Last Year: Never true  Transportation Needs: No Transportation Needs (08/20/2022)   Received from [facility] - Transportation    In the past 12 months, has lack of transportation kept you from medical appointments or from getting medications?: No    Lack of Transportation (Non-Medical): No  Physical Activity: Insufficiently  Active (08/06/2021)   Received from [facility], [facility]   Exercise Vital Sign    Days of Exercise per Week: 2 days    Minutes of Exercise per Session: 30 min  Stress: No Stress Concern Present (08/27/2021)   Received from [facility], [facility]  [employer] of Occupational [facility] - Occupational Stress Questionnaire    Feeling of Stress : Only a little  Social Connections: Socially Isolated (07/31/2021)   Received from [facility], [facility]   Social Connection and Isolation Panel [NHANES]    Frequency of Communication with Friends and Family: More than three times a week    Frequency of Social Gatherings with Friends and Family: Three times a week    Attends Religious Services: Never    Active Member of Clubs or Organizations: No    Attends Banker Meetings: Never    Marital Status: Widowed    Allergies:  Allergies  Allergen Reactions   Penicillins Hives    TOLERATED CEFAZOLIN    Metabolic Disorder Labs: No results found for: "HGBA1C", "MPG" No results found for: "PROLACTIN" No results found for: "CHOL", "TRIG", "HDL", "CHOLHDL", "VLDL", "LDLCALC" No results found for: "TSH"  Therapeutic Level Labs: No results found for: "LITHIUM" Lab Results  Component Value Date   VALPROATE <10 (L) 07/18/2021   No results found for: "CBMZ"  Current Medications: Current Outpatient Medications  Medication Sig Dispense Refill   acetaminophen (TYLENOL) 325 MG tablet Take 2 tablets (650 mg total) by mouth every 6 (six) hours as needed for mild pain, fever or headache.     alendronate (FOSAMAX) 70 MG tablet Take 1 tablet by mouth every Monday.     ferrous sulfate 325 (65 FE) MG tablet Take 1 tablet by mouth daily with breakfast.     letrozole (FEMARA) 2.5 MG tablet Take 1 tablet (2.5 mg total) by mouth daily. 90 tablet 3   levothyroxine (SYNTHROID,  LEVOTHROID) 88 MCG tablet Take 88 mcg by mouth daily before breakfast.     lidocaine (LMX) 4 % cream Apply 1 Application topically as needed. 30 g 0   midodrine (PROAMATINE) 10 MG tablet Take 1 tablet (10 mg total) by mouth 3 (three) times daily with meals. 90 tablet 1   midodrine (PROAMATINE) 10 MG tablet Take by mouth 3 (three) times daily.     Vitamin D, Ergocalciferol, (DRISDOL) 1.25 MG (50000 UNIT) CAPS capsule Take 50,000 Units by mouth once a week.     [START ON 02/14/2023] QUEtiapine (SEROQUEL) 25 MG tablet Take 1 tablet (25 mg total) by mouth at bedtime. 90 tablet 0   sertraline (ZOLOFT) 50 MG tablet Take 1.5 tablets (75 mg total) by mouth at bedtime. 135 tablet 0   No current facility-administered medications for this visit.     Musculoskeletal: Strength & Muscle Tone: within normal limits Gait & Station:  uses a walker Patient leans: N/A  Psychiatric Specialty Exam: Review of Systems  Psychiatric/Behavioral:  Positive for dysphoric mood and sleep disturbance. Negative for agitation, behavioral problems, confusion, decreased concentration, hallucinations, self-injury and suicidal ideas. The patient is nervous/anxious. The patient is not hyperactive.   All other systems reviewed and are negative.   Blood pressure 123/75, pulse 90, temperature (!) 97.1 F (36.2 C), temperature source Skin, height 5' 2.01" (1.575 m), weight 117 lb 6.4 oz (53.3 kg).Body mass index is 21.47 kg/m.  General Appearance: Well Groomed  Eye Contact:  Good  Speech:  Clear and Coherent  Volume:  Normal  Mood:   depressed  Affect:  Appropriate, Congruent, and less restricted, smiles at times  Thought Process:  Coherent  Orientation:  Full (Time, Place, and Person)  Thought Content: Delusions   Suicidal Thoughts:  No  Homicidal Thoughts:  No  Memory:  Immediate;  Fair  Judgement:  Good  Insight:  Present  Psychomotor Activity:  Normal  Concentration:  Concentration: Good and Attention Span: Good   Recall:  Fair  Fund of Knowledge: Good  Language: Good  Akathisia:  No  Handed:  Right  AIMS (if indicated): not done  Assets:  Social Support  ADL's:  Intact  Cognition: Impaired,  Mild  Sleep:  Fair   Screenings: GAD-7    Garment/textile technologist Visit from 07/22/2022 in [facility] Office Visit from 02/25/2022 in [facility] Office Visit from 12/31/2021 in [facility] Office Visit from 11/05/2021 in [facility]  Total GAD-7 Score 21 19 18 21       PHQ2-9    Flowsheet Row Office Visit from 07/22/2022 in [facility] Office Visit from 04/22/2022 in [facility] Office Visit from 02/25/2022 in [facility] Office Visit from 12/31/2021 in [facility] Office Visit from 11/05/2021 in [facility]  PHQ-2 Total Score 6 6 6 4 2   PHQ-9 Total Score 27 22 18 16 11       Flowsheet Row ED to Hosp-Admission (Discharged) from 08/16/2022 in [facility] Office Visit from 07/22/2022 in [facility] Office Visit from 04/22/2022 in [facility]  C-SSRS RISK CATEGORY No Risk Error: Q3, 4, or 5 should not be populated when Q2 is No Error: Q3, 4, or 5 should not be populated when Q2 is No        Assessment and Plan:  LACY HULSEBUS is a 72 y.o. 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    1. Bipolar affective disorder, currently depressed, moderate  (HCC) R/o MDD with psychotic features  Acute stressors include: undergoing radiation treatment for breast caner s/p mastectomy Other stressors include:    History:  history of bipolar disorder (has this diagnosis according to her son, although she has no known manic symptoms), r/o early manifestation of neuropsychiatric symptoms secondary to underlying neurocognitive disorder.   Exam is notable for smile at the visit, and she demonstrates less rumination on ego-syntonic thoughts of being antichrist, and somatic complaints.  According to the collateral from her son, this has been steady without marked behavioral disturbances.  Will try higher dose of sertraline if she is amenable.  Her insomnia we have discussion about this with the patient.  Will at least continue the current dose to target depression and anxiety.  Will continue quetiapine for mood dysregulation. Noted that although she has a history of bipolar disorder, her son has only observed symptoms of paranoia, and no known manic symptoms.     2. Insomnia, unspecified type She complains of initial and middle insomnia, although her son is unsure about this.  It was previously discussed to reduce drinking Dr. Reino Kent (drinks 4 cans)- will follow up on this at her next visit. Will continue quetiapine for insomnia.     R/o cognitive impairment Functional Status   IADL: Independent in the following: managing finances, medications, driving           Requires assistance with the following:  ADL  Independent in the  following: bathing and hygiene, feeding, continence, grooming and toileting, walking          Requires assistance with the following: Folate, Vitamin B12, TSH Images Brain MRI 01/2022  Brain: Diffusion imaging does not show any acute or subacute infarction. No focal abnormality affects the brainstem or cerebellum. Cerebral hemispheres show subjectively the relative absence of volume loss. There are scattered foci of T2 and FLAIR signal  within the white matter consistent with mild to low moderate chronic small-vessel ischemic change. No advanced disease. No cortical or large vessel territory infarction. No mass lesion, hemorrhage, hydrocephalus or extra-axial collection. No generalized or lobar specific volume loss. Moderate chronic small-vessel ischemic changes of the cerebral hemispheric white matter. No acute or reversible finding.             amyloid PET 05/2022.: Negative study indicating sparse or no amyloid neuritic plaque Neuropsych assessment: she was seen at [facility] clinic 12/2021. Dx r/o late onset alzheimer. MMSE Total Score 26/30 per chart. (MOCA 14 in 10/2021) Etiology:  Lewy body dementia, Alzheimer's disease, and/or depression Unchanged. I extensively reviewed chart outside of the system. PET scan was not conclusive for Alzheimer's disease.  It has been very difficult to tease out whether her current psychotic natures are only secondary to mood symptoms and or any component of neurodegenerative disorder.  The patient is not interested in neuropsych evaluation or skin biopsy. Will continue to assess this.  Plan  Increase sertraline 100 mg daily (if she is amenable) Continue quetiapine 25 mg at night (QTc HR 73, QTC 427 msec 03/2022) Next appointment: 2/20 at 10 am, IP - consent form to have 2-way conversation with her son, Onalee Hua is on file.    The patient demonstrates the following risk factors for suicide: Chronic risk factors for suicide include: psychiatric disorder of depression . Acute risk factors for suicide include: unemployment, social withdrawal/isolation, and recent suicide attempt/discharges from inpatient psychiatry. Protective factors for this patient include: positive social support. Considering these factors, the overall suicide risk at this point appears to be moderate, but not at imminent risk. There are no guns in the house. Patient is appropriate for outpatient follow up.     Collaboration of  Care: Collaboration of Care: Other reviewed notes in Epic  Patient/Guardian was advised Release of Information must be obtained prior to any record release in order to collaborate their care with an outside provider. Patient/Guardian was advised if they have not already done so to contact the registration department to sign all necessary forms in order for [place] to release information regarding their care.   Consent: Patient/Guardian gives verbal consent for treatment and assignment of benefits for services provided during this visit. Patient/Guardian expressed understanding and agreed to proceed.    Neysa Hotter, MD 01/07/2023, 10:50 AM

## 2023-01-07 ENCOUNTER — Encounter: Payer: Self-pay | Admitting: Psychiatry

## 2023-01-07 ENCOUNTER — Ambulatory Visit (INDEPENDENT_AMBULATORY_CARE_PROVIDER_SITE_OTHER): Payer: Medicare HMO | Admitting: Psychiatry

## 2023-01-07 VITALS — BP 123/75 | HR 90 | Temp 97.1°F | Ht 62.01 in | Wt 117.4 lb

## 2023-01-07 DIAGNOSIS — G4700 Insomnia, unspecified: Secondary | ICD-10-CM | POA: Diagnosis not present

## 2023-01-07 DIAGNOSIS — F3132 Bipolar disorder, current episode depressed, moderate: Secondary | ICD-10-CM

## 2023-01-07 MED ORDER — QUETIAPINE FUMARATE 25 MG PO TABS: 25.0000 mg | ORAL_TABLET | Freq: Every day | ORAL | 0 refills | Status: DC

## 2023-01-07 NOTE — Patient Instructions (Addendum)
Increase sertraline 100 mg daily  Continue quetiapine 25 mg at night  Next appointment: 2/20 at 10 am

## 2023-02-05 ENCOUNTER — Ambulatory Visit (INDEPENDENT_AMBULATORY_CARE_PROVIDER_SITE_OTHER): Payer: Medicare Other | Admitting: Urology

## 2023-02-05 VITALS — BP 133/76 | HR 111 | Ht 60.2 in | Wt 118.4 lb

## 2023-02-05 DIAGNOSIS — R3129 Other microscopic hematuria: Secondary | ICD-10-CM | POA: Diagnosis not present

## 2023-02-05 LAB — URINALYSIS, COMPLETE
Bilirubin, UA: NEGATIVE
Glucose, UA: NEGATIVE
Ketones, UA: NEGATIVE
Nitrite, UA: NEGATIVE
Protein,UA: NEGATIVE
Specific Gravity, UA: 1.02 (ref 1.005–1.030)
Urobilinogen, Ur: 0.2 mg/dL (ref 0.2–1.0)
pH, UA: 5.5 (ref 5.0–7.5)

## 2023-02-05 LAB — MICROSCOPIC EXAMINATION

## 2023-02-05 NOTE — Progress Notes (Signed)
   02/05/23  CC:  Chief Complaint  Patient presents with   Cysto    HPI:   73 year old female who presents today for cystoscopy for microscopic hematuria.  Upper tract imaging deferred, see previous note for details.  Blood pressure 133/76, pulse (!) 111, height 5' 0.2" (1.529 m), weight 118 lb 6 oz (53.7 kg). NED. A&Ox3.   No respiratory distress   Abd soft, NT, ND Normal external genitalia with somewhat narrow vaginal introitus.  Unable to visualize urethra due to hip mobility and compliance.  Cystoscopy Procedure Note  Patient identification was confirmed, informed consent was obtained, and patient was prepped using Betadine solution.  Lidocaine  jelly was administered per urethral meatus.    Procedure: - Flexible cystoscope introduced, without any difficulty.   - Thorough search of the bladder revealed:    normal urethral meatus    normal urothelium    no stones    no ulcers     no tumors    no urethral polyps    no trabeculation  - Ureteral orifices were normal in position and appearance.  Post-Procedure: - Patient tolerated the procedure well  Assessment/ Plan:  1. Microscopic hematuria (Primary) Cystoscopy today was reassuring  Suspect microscopic blood may be from external source given anatomic issues identified today  Consider referral back in 2 to 3 years of microscopic hematuria persist or degree of hematuria worsens. - Urinalysis, Complete  Dustin Gimenez, MD

## 2023-03-08 NOTE — Progress Notes (Deleted)
 BH MD/PA/NP OP Progress Note  03/08/2023 10:48 AM Elizabeth [name]  MRN:  [id number]  Chief Complaint: No chief complaint on file.  HPI:  - since the last visit, she was seen by family medicine for microscopic hematuria. - Persistent microscopic hematuria. Recent cystoscopy on February 05, 2023, showed no abnormalities.  Recommended for GI follow up for anemia evaluation.    Visit Diagnosis: No diagnosis found.  Past Psychiatric History: Please see initial evaluation for full details. I have reviewed the history. No updates at this time.     Past Medical History:  Past Medical History:  Diagnosis Date   Acute cholecystitis 2016   Anemia    Anemia 2016   Anxiety    panic attacks   Anxiety and depression    Bipolar 1 disorder (HCC)    Bowel obstruction (HCC) 2017   Common bile duct dilatation 2015   Dyspnea    Edema    Fibromyalgia    GI bleed 2017   History of kidney stones 01/21/2009   HSV-1 infection    Hyperglycemia    Hyperlipidemia    Hypokalemia    Hypothyroidism    Liver cyst    Malignant neoplasm of right female breast, unspecified estrogen receptor status, unspecified site of breast (HCC)    Memory loss    Osteopenia    Panic disorder    Schizophrenia (HCC)    Ventral hernia     Past Surgical History:  Procedure Laterality Date   ABDOMINAL HYSTERECTOMY     BREAST BIOPSY Right 01/19/2021   [place] Bx, ribbon, path pending   BREAST BIOPSY Right 01/19/2021   [place] Bx, Venus, path pending   BREAST BIOPSY Right 01/19/2021   [place] Bx, heart, path pending   BREAST BIOPSY Right 01/19/2021   [place] BX Axilla, Coil, path pending   CESAREAN SECTION     CHOLECYSTECTOMY OPEN  11/17/2013   with repair of bile duct-Dr. Egbert Garibaldi   MASTECTOMY MODIFIED RADICAL Right 03/02/2021   Procedure: MASTECTOMY MODIFIED RADICAL;  Surgeon: Sung Amabile, DO;  Location: [facility];  Service: General;  Laterality: Right;   OPEN REDUCTION INTERNAL FIXATION (ORIF) DISTAL RADIAL FRACTURE Right  12/20/2014   Procedure: OPEN REDUCTION INTERNAL FIXATION (ORIF) RIGHT DISTAL RADIAL FRACTURE;  Surgeon: Sheral Apley, MD;  Location: [facility];  Service: Orthopedics;  Laterality: Right;   STOMACH SURGERY      Family Psychiatric History: Please see initial evaluation for full details. I have reviewed the history. No updates at this time.     Family History:  Family History  Problem Relation Age of Onset   Heart disease Father    Breast cancer Paternal Aunt    Kidney disease Paternal Aunt    Breast cancer Paternal Grandmother    Asthma Daughter    Bladder Cancer Neg Hx     Social History:  Social History   Socioeconomic History   Marital status: Married    Spouse name: Not on file   Number of children: 1   Years of education: Not on file   Highest education level: Not on file  Occupational History   Not on file  Tobacco Use   Smoking status: Former    Current packs/day: 0.00    Types: Cigarettes    Quit date: 11/03/1973    Years since quitting: 49.3    Passive exposure: Past   Smokeless tobacco: Never   Tobacco comments:    Smoked briefly as a teen- lives in  secondhand smoke.   Vaping Use   Vaping status: Never Used  Substance and Sexual Activity   Alcohol use: No    Alcohol/week: 0.0 standard drinks of alcohol   Drug use: No   Sexual activity: Not Currently  Other Topics Concern   Not on file  Social History Narrative   Not on file   Social Drivers of Health   Financial Resource Strain: Low Risk  (08/20/2022)   Received from [facility]   Overall Financial Resource Strain (CARDIA)    Difficulty of Paying Living Expenses: Not hard at all  Food Insecurity: No Food Insecurity (08/20/2022)   Received from [facility]   Hunger Vital Sign    Worried About Running Out of Food in the Last Year: Never true    Ran Out of Food in the Last Year: Never true  Transportation Needs: No Transportation Needs  (08/20/2022)   Received from [facility] - Transportation    In the past 12 months, has lack of transportation kept you from medical appointments or from getting medications?: No    Lack of Transportation (Non-Medical): No  Physical Activity: Insufficiently Active (08/06/2021)   Received from [facility], [facility]   Exercise Vital Sign    Days of Exercise per Week: 2 days    Minutes of Exercise per Session: 30 min  Stress: No Stress Concern Present (08/27/2021)   Received from [facility], [facility]   [employer] of Occupational Health - Occupational Stress Questionnaire    Feeling of Stress : Only a little  Social Connections: Socially Isolated (07/31/2021)   Received from [facility], [facility]   Social Connection and Isolation Panel [NHANES]    Frequency of Communication with Friends and Family: More than three times a week    Frequency of Social Gatherings with Friends and Family: Three times a week    Attends Religious Services: Never    Active Member of Clubs or Organizations: No    Attends Banker Meetings: Never    Marital Status: Widowed    Allergies:  Allergies  Allergen Reactions   Penicillins Hives    TOLERATED CEFAZOLIN    Metabolic Disorder Labs: No results found for: "HGBA1C", "MPG" No results found for: "PROLACTIN" No results found for: "CHOL", "TRIG", "HDL", "CHOLHDL", "VLDL", "LDLCALC" No results found for: "TSH"  Therapeutic Level Labs: No results found for: "LITHIUM" Lab Results  Component Value Date   VALPROATE <10 (L) 07/18/2021   No results found for: "CBMZ"  Current Medications: Current Outpatient Medications  Medication Sig Dispense Refill   acetaminophen (TYLENOL) 325 MG tablet Take 2 tablets (650 mg total) by mouth every 6 (six) hours as needed for mild pain, fever or headache.      alendronate (FOSAMAX) 70 MG tablet Take 1 tablet by mouth every Monday.     ferrous sulfate 325 (65 FE) MG tablet Take 1 tablet by mouth daily with breakfast.     letrozole (FEMARA) 2.5 MG tablet Take 1 tablet (2.5 mg total) by mouth daily. 90 tablet 3   levothyroxine (SYNTHROID, LEVOTHROID) 88 MCG tablet Take 88 mcg by mouth daily before breakfast.     lidocaine (LMX) 4 % cream Apply 1 Application topically as needed. 30 g 0   midodrine (PROAMATINE) 10 MG tablet Take 1 tablet (10 mg total) by mouth 3 (three) times daily with  meals. 90 tablet 1   midodrine (PROAMATINE) 10 MG tablet Take by mouth 3 (three) times daily.     QUEtiapine (SEROQUEL) 25 MG tablet Take 1 tablet (25 mg total) by mouth at bedtime. 90 tablet 0   sertraline (ZOLOFT) 50 MG tablet Take 1.5 tablets (75 mg total) by mouth at bedtime. 135 tablet 0   Vitamin D, Ergocalciferol, (DRISDOL) 1.25 MG (50000 UNIT) CAPS capsule Take 50,000 Units by mouth once a week.     No current facility-administered medications for this visit.     Musculoskeletal: Strength & Muscle Tone: within normal limits Gait & Station: normal Patient leans: N/A  Psychiatric Specialty Exam: Review of Systems  There were no vitals taken for this visit.There is no height or weight on file to calculate BMI.  General Appearance: {Appearance:22683}  Eye Contact:  {BHH EYE CONTACT:22684}  Speech:  Clear and Coherent  Volume:  Normal  Mood:  {BHH MOOD:22306}  Affect:  {Affect (PAA):22687}  Thought Process:  Coherent  Orientation:  Full (Time, Place, and Person)  Thought Content: Logical   Suicidal Thoughts:  {ST/HT (PAA):22692}  Homicidal Thoughts:  {ST/HT (PAA):22692}  Memory:  Immediate;   Good  Judgement:  {Judgement (PAA):22694}  Insight:  {Insight (PAA):22695}  Psychomotor Activity:  Normal  Concentration:  Concentration: Good and Attention Span: Good  Recall:  Good  Fund of Knowledge: Good  Language: Good  Akathisia:  No  Handed:  Right   AIMS (if indicated): not done  Assets:  Communication Skills Desire for Improvement  ADL's:  Intact  Cognition: WNL  Sleep:  {B[facility] GOOD/FAIR/POOR:22877}   Screenings: GAD-7    Flowsheet Row Office Visit from 07/22/2022 in [facility] Office Visit from 02/25/2022 in [facility] Office Visit from 12/31/2021 in [facility] Office Visit from 11/05/2021 in [facility]  Total GAD-7 Score 21 19 18 21       PHQ2-9    Flowsheet Row Office Visit from 07/22/2022 in [facility] Office Visit from 04/22/2022 in [facility] Office Visit from 02/25/2022 [facility] Office Visit from 12/31/2021 in [facility] Office Visit from 11/05/2021 in [facility]  PHQ-2 Total Score 6 6 6 4 2   PHQ-9 Total Score 27 22 18 16 11       Flowsheet Row ED to Hosp-Admission (Discharged) from 08/16/2022 in [facility] Office Visit from 07/22/2022 in [facility] Office Visit from 04/22/2022 in [facility]  C-SSRS RISK CATEGORY No Risk Error: Q3, 4, or 5 should not be populated when Q2 is No Error: Q3, 4, or 5 should not be populated when Q2 is No        Assessment and Plan:  Elizabeth [name] is a 73 y.o. 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    1. Bipolar affective disorder, currently depressed, moderate (HCC) R/o MDD with psychotic features  Acute stressors  include: undergoing radiation treatment for breast caner s/p mastectomy Other stressors include:    History:  history of bipolar disorder (has this diagnosis according to her son, although she has no known manic  symptoms), r/o early manifestation of neuropsychiatric symptoms secondary to underlying neurocognitive disorder.   Exam is notable for smile at the visit, and she demonstrates less rumination on ego-syntonic thoughts of being antichrist, and somatic complaints.  According to the collateral from her son, this has been steady without marked behavioral disturbances.  Will try higher dose of sertraline if she is amenable.  Her insomnia we have discussion about this with the patient.  Will at least continue the current dose to target depression and anxiety.  Will continue quetiapine for mood dysregulation. Noted that although she has a history of bipolar disorder, her son has only observed symptoms of paranoia, and no known manic symptoms.      2. Insomnia, unspecified type She complains of initial and middle insomnia, although her son is unsure about this.  It was previously discussed to reduce drinking Dr. Reino Kent (drinks 4 cans)- will follow up on this at her next visit. Will continue quetiapine for insomnia.      R/o cognitive impairment Functional Status   IADL: Independent in the following: managing finances, medications, driving           Requires assistance with the following:  ADL  Independent in the following: bathing and hygiene, feeding, continence, grooming and toileting, walking          Requires assistance with the following: Folate, Vitamin B12, TSH Images Brain MRI 01/2022  Brain: Diffusion imaging does not show any acute or subacute infarction. No focal abnormality affects the brainstem or cerebellum. Cerebral hemispheres show subjectively the relative absence of volume loss. There are scattered foci of T2 and FLAIR signal within the white matter consistent with mild to low  moderate chronic small-vessel ischemic change. No advanced disease. No cortical or large vessel territory infarction. No mass lesion, hemorrhage, hydrocephalus or extra-axial collection. No generalized or lobar specific volume loss. Moderate chronic small-vessel ischemic changes of the cerebral hemispheric white matter. No acute or reversible finding.             amyloid PET 05/2022.: Negative study indicating sparse or no amyloid neuritic plaque Neuropsych assessment: she was seen at [facility] clinic 12/2021. Dx r/o late onset alzheimer. MMSE Total Score 26/30 per chart. (MOCA 14 in 10/2021) Etiology:  Lewy body dementia, Alzheimer's disease, and/or depression Unchanged. I extensively reviewed chart outside of the system. PET scan was not conclusive for Alzheimer's disease.  It has been very difficult to tease out whether her current psychotic natures are only secondary to mood symptoms and or any component of neurodegenerative disorder.  The patient is not interested in neuropsych evaluation or skin biopsy. Will continue to assess this.    Last checked  EKG HR 73, QTc427 msec 03/2022  Lipid panels LDL 128  01/2023  HbA1c 5.2 07/2022      Plan  Increase sertraline 100 mg daily (if she is amenable) Continue quetiapine 25 mg at night  Next appointment: 2/20 at 10 am, IP - consent form to have 2-way conversation with her son, Onalee Hua is on file.    The patient demonstrates the following risk factors for suicide: Chronic risk factors for suicide include: psychiatric disorder of depression . Acute risk factors for suicide include: unemployment, social withdrawal/isolation, and recent suicide attempt/discharges from inpatient psychiatry. Protective factors for this patient include: positive social support. Considering these factors, the overall suicide risk at this point appears to be moderate, but not at imminent risk. There are no guns in the house. Patient is appropriate for  outpatient follow up.      Collaboration of Care: Collaboration of Care: {BH OP Collaboration of Care:21014065}  Patient/Guardian was advised Release of Information must be obtained prior to any record release in order to collaborate their care with an outside provider. Patient/Guardian was advised if they have not already done so to contact the registration department to sign all necessary forms in order for [place] to release information regarding their care.   Consent: Patient/Guardian gives verbal consent for treatment and assignment of benefits for services provided during this visit. Patient/Guardian expressed understanding and agreed to proceed.    Neysa Hotter, MD 03/08/2023, 10:48 AM

## 2023-03-10 ENCOUNTER — Other Ambulatory Visit: Payer: Self-pay | Admitting: Psychiatry

## 2023-03-10 MED ORDER — SERTRALINE HCL 100 MG PO TABS: 100.0000 mg | ORAL_TABLET | Freq: Every day | ORAL | 0 refills | Status: DC

## 2023-03-13 ENCOUNTER — Telehealth: Admitting: Psychiatry

## 2023-03-13 NOTE — Telephone Encounter (Signed)
 This has been addressed.

## 2023-04-28 NOTE — Progress Notes (Deleted)
 BH MD/PA/NP OP Progress Note  04/28/2023 8:18 AM Elizabeth [name]  MRN:  [id number]  Chief Complaint: No chief complaint on file.  HPI: *** - According to the chart review, the following events have occurred since the last visit: The patient was seen by her primary care provider. Midodrine was continued for orthostatic hypotension.  - GI follow up for anmeia was recommended.   Worsening in memory  Visit Diagnosis: No diagnosis found.  Past Psychiatric History: Please see initial evaluation for full details. I have reviewed the history. No updates at this time.     Past Medical History:  Past Medical History:  Diagnosis Date   Acute cholecystitis 2016   Anemia    Anemia 2016   Anxiety    panic attacks   Anxiety and depression    Bipolar 1 disorder (HCC)    Bowel obstruction (HCC) 2017   Common bile duct dilatation 2015   Dyspnea    Edema    Fibromyalgia    GI bleed 2017   History of kidney stones 01/21/2009   HSV-1 infection    Hyperglycemia    Hyperlipidemia    Hypokalemia    Hypothyroidism    Liver cyst    Malignant neoplasm of right female breast, unspecified estrogen receptor status, unspecified site of breast (HCC)    Memory loss    Osteopenia    Panic disorder    Schizophrenia (HCC)    Ventral hernia     Past Surgical History:  Procedure Laterality Date   ABDOMINAL HYSTERECTOMY     BREAST BIOPSY Right 01/19/2021   [place] Bx, ribbon, path pending   BREAST BIOPSY Right 01/19/2021   [place] Bx, Venus, path pending   BREAST BIOPSY Right 01/19/2021   [place] Bx, heart, path pending   BREAST BIOPSY Right 01/19/2021   [place] BX Axilla, Coil, path pending   CESAREAN SECTION     CHOLECYSTECTOMY OPEN  11/17/2013   with repair of bile duct-Dr. Egbert Garibaldi   MASTECTOMY MODIFIED RADICAL Right 03/02/2021   Procedure: MASTECTOMY MODIFIED RADICAL;  Surgeon: Sung Amabile, DO;  Location: [facility];  Service: General;  Laterality: Right;   OPEN REDUCTION INTERNAL FIXATION (ORIF) DISTAL  RADIAL FRACTURE Right 12/20/2014   Procedure: OPEN REDUCTION INTERNAL FIXATION (ORIF) RIGHT DISTAL RADIAL FRACTURE;  Surgeon: Sheral Apley, MD;  Location: [facility];  Service: Orthopedics;  Laterality: Right;   STOMACH SURGERY      Family Psychiatric History: Please see initial evaluation for full details. I have reviewed the history. No updates at this time.     Family History:  Family History  Problem Relation Age of Onset   Heart disease Father    Breast cancer Paternal Aunt    Kidney disease Paternal Aunt    Breast cancer Paternal Grandmother    Asthma Daughter    Bladder Cancer Neg Hx     Social History:  Social History   Socioeconomic History   Marital status: Married    Spouse name: Not on file   Number of children: 1   Years of education: Not on file   Highest education level: Not on file  Occupational History   Not on file  Tobacco Use   Smoking status: Former    Current packs/day: 0.00    Types: Cigarettes    Quit date: 11/03/1973    Years since quitting: 49.5    Passive exposure: Past   Smokeless tobacco: Never   Tobacco comments:    Smoked  briefly as a teen- lives in secondhand smoke.   Vaping Use   Vaping status: Never Used  Substance and Sexual Activity   Alcohol use: No    Alcohol/week: 0.0 standard drinks of alcohol   Drug use: No   Sexual activity: Not Currently  Other Topics Concern   Not on file  Social History Narrative   Not on file   Social Drivers of Health   Financial Resource Strain: Low Risk  (08/20/2022)   [facility] from [facility]   Overall Financial Resource Strain (CARDIA)    Difficulty of Paying Living Expenses: Not hard at all  Food Insecurity: No Food Insecurity (08/20/2022)   Received from [facility]   Hunger Vital Sign    Worried About Running Out of Food in the Last Year: Never true    Ran Out of Food in the Last Year: Never true  Transportation Needs: No  Transportation Needs (08/20/2022)   Received from [facility] - Transportation    In the past 12 months, has lack of transportation kept you from medical appointments or from getting medications?: No    Lack of Transportation (Non-Medical): No  Physical Activity: Insufficiently Active (08/06/2021)   [facility], [facility]   Exercise Vital Sign    Days of Exercise per Week: 2 days    Minutes of Exercise per Session: 30 min  Stress: No Stress Concern Present (08/27/2021)   Received from [facility], [facility]   [employer] of Occupational Health - Occupational Stress Questionnaire    Feeling of Stress : Only a little  Social Connections: Socially Isolated (07/31/2021)   Received from [facility], [facility]   Social Connection and Isolation Panel [NHANES]    Frequency of Communication with Friends and Family: More than three times a week    Frequency of Social Gatherings with Friends and Family: Three times a week    Attends Religious Services: Never    Active Member of Clubs or Organizations: No    Attends Banker Meetings: Never    Marital Status: Widowed    Allergies:  Allergies  Allergen Reactions   Penicillins Hives    TOLERATED CEFAZOLIN    Metabolic Disorder Labs: No results found for: "HGBA1C", "MPG" No results found for: "PROLACTIN" No results found for: "CHOL", "TRIG", "HDL", "CHOLHDL", "VLDL", "LDLCALC" No results found for: "TSH"  Therapeutic Level Labs: No results found for: "LITHIUM" Lab Results  Component Value Date   VALPROATE <10 (L) 07/18/2021   No results found for: "CBMZ"  Current Medications: Current Outpatient Medications  Medication Sig Dispense Refill   acetaminophen (TYLENOL) 325 MG tablet Take 2 tablets (650 mg total) by mouth every 6 (six) hours as needed for mild pain, fever  or headache.     alendronate (FOSAMAX) 70 MG tablet Take 1 tablet by mouth every Monday.     ferrous sulfate 325 (65 FE) MG tablet Take 1 tablet by mouth daily with breakfast.     letrozole (FEMARA) 2.5 MG tablet Take 1 tablet (2.5 mg total) by mouth daily. 90 tablet 3   levothyroxine (SYNTHROID, LEVOTHROID) 88 MCG tablet Take 88 mcg by mouth daily before breakfast.     lidocaine (LMX) 4 % cream Apply 1 Application topically as needed. 30 g 0   midodrine (PROAMATINE) 10 MG tablet Take 1 tablet (10 mg total) by  mouth 3 (three) times daily with meals. 90 tablet 1   midodrine (PROAMATINE) 10 MG tablet Take by mouth 3 (three) times daily.     QUEtiapine (SEROQUEL) 25 MG tablet Take 1 tablet (25 mg total) by mouth at bedtime. 90 tablet 0   sertraline (ZOLOFT) 100 MG tablet Take 1 tablet (100 mg total) by mouth at bedtime. 90 tablet 0   Vitamin D, Ergocalciferol, (DRISDOL) 1.25 MG (50000 UNIT) CAPS capsule Take 50,000 Units by mouth once a week.     No current facility-administered medications for this visit.     Musculoskeletal: Strength & Muscle Tone: within normal limits Gait & Station: normal Patient leans: N/A  Psychiatric Specialty Exam: Review of Systems  There were no vitals taken for this visit.There is no height or weight on file to calculate BMI.  General Appearance: {Appearance:22683}  Eye Contact:  {BHH EYE CONTACT:22684}  Speech:  Clear and Coherent  Volume:  Normal  Mood:  {BHH MOOD:22306}  Affect:  {Affect (PAA):22687}  Thought Process:  Coherent  Orientation:  Full (Time, Place, and Person)  Thought Content: Logical   Suicidal Thoughts:  {ST/HT (PAA):22692}  Homicidal Thoughts:  {ST/HT (PAA):22692}  Memory:  Immediate;   Good  Judgement:  {Judgement (PAA):22694}  Insight:  {Insight (PAA):22695}  Psychomotor Activity:  Normal  Concentration:  Concentration: Good and Attention Span: Good  Recall:  Good  Fund of Knowledge: Good  Language: Good  Akathisia:  No   Handed:  Right  AIMS (if indicated): not done  Assets:  Communication Skills Desire for Improvement  ADL's:  Intact  Cognition: WNL  Sleep:  {BHH GOOD/FAIR/POOR:22877}   Screenings: GAD-7    Flowsheet Row Office Visit from 07/22/2022 in [facility] Visit from 02/25/2022 in [facility] Visit from 12/31/2021 in [facility] Office Visit from 11/05/2021 in [facility]  Total GAD-7 Score 21 19 18 21       PHQ2-9    Flowsheet Row Office Visit from 07/22/2022 in [facility] Office Visit from 04[facility]22/2022 in [facility] Office Visit from 02/25/2022 in [facility] Office Visit from 12[facility]31/2021 in [facility] Office Visit from 11/05/2021 in [facility]  PHQ-2 Total Score 6 6 6 4 2   PHQ-9 Total Score 27 22 18 16 11       Flowsheet Row ED to Hosp-Admission (Discharged) from 08/16/2022 in [facility] Office Visit from 07/22/2022 in [facility] Office Visit from 04/22/2022 in [facility]  C-SSRS RISK CATEGORY No Risk Error: Q3, 4, or 5 should not be populated when Q2 is No Error: Q3, 4, or 5 should not be populated when Q2 is No        Assessment and Plan:  Elizabeth [name] is a 73 y.o. 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    1. Bipolar affective disorder, currently depressed, moderate (HCC) R/o MDD with psychotic features   Acute stressors include: undergoing radiation treatment for breast caner s/p mastectomy Other stressors include:    History:  history of bipolar disorder (has this diagnosis according to her son,  although she has no known manic symptoms), r/o early manifestation of neuropsychiatric symptoms secondary to underlying neurocognitive disorder.   Exam is notable for smile at the visit, and she demonstrates less rumination on ego-syntonic thoughts of being antichrist, and somatic complaints.  According to the collateral from her son, this has been steady without marked behavioral disturbances.  Will try higher dose of sertraline if she is amenable.  Her insomnia we have discussion about this with the patient.  Will at least continue the current dose to target depression and anxiety.  Will continue quetiapine for mood dysregulation. Noted that although she has a history of bipolar disorder, her son has only observed symptoms of paranoia, and no known manic symptoms.      2. Insomnia, unspecified type She complains of initial and middle insomnia, although her son is unsure about this.  It was previously discussed to reduce drinking Dr. Reino Kent (drinks 4 cans)- will follow up on this at her next visit. Will continue quetiapine for insomnia.      R/o cognitive impairment Functional Status   IADL: Independent in the following: managing finances, medications, driving           Requires assistance with the following:  ADL  Independent in the following: bathing and hygiene, feeding, continence, grooming and toileting, walking          Requires assistance with the following: Folate, Vitamin/B12 wnl 08/2021, TSH wnl1/2025 Images Brain MRI 01/2022  Brain: Diffusion imaging does not show any acute or subacute infarction. No focal abnormality affects the brainstem or cerebellum. Cerebral hemispheres show subjectively the relative absence of volume loss. There are scattered foci of T2 and FLAIR signal within the white  matter consistent with mild to low moderate chronic small-vessel ischemic change. No advanced disease. No cortical or large vessel territory infarction. No mass lesion, hemorrhage, hydrocephalus or extra-axial collection. No generalized or lobar specific volume loss. Moderate chronic small-vessel ischemic changes of the cerebral hemispheric white matter. No acute or reversible finding.             amyloid PET 05/2022.: Negative study indicating sparse or no amyloid neuritic plaque Neuropsych assessment: she was seen at [facility] clinic 12/2021. Dx r/o late onset alzheimer. MMSE Total Score 26/30 per chart. (MOCA 14 in 10/2021) Etiology:  Lewy body dementia, Alzheimer's disease, and/or depression Unchanged. I extensively reviewed chart outside of the system. PET scan was not conclusive for Alzheimer's disease.  It has been very difficult to tease out whether her current psychotic natures are only secondary to mood symptoms and or any component of neurodegenerative disorder.  The patient is not interested in neuropsych evaluation or skin biopsy. Will continue to assess this.        Last checked  EKG HR 82, QTc 441 msec 07/2022  Lipid panels LDL 128 01/2023  HbA1c Glu 83 01/2023     Plan  Increase sertraline 100 mg daily (if she is amenable) Continue quetiapine 25 mg at night  Next appointment: 2/20 at 10 am, IP - consent form to have 2-way conversation with her son, Onalee Hua is on file.    The patient demonstrates the following risk factors for suicide: Chronic risk factors for suicide include: psychiatric disorder of depression . Acute risk factors for suicide include: unemployment, social withdrawal/isolation, and recent suicide attempt/discharges from inpatient psychiatry. Protective factors for this patient include: positive social support. Considering these factors, the overall suicide risk at this point appears to be moderate, but not at imminent  risk. There are no guns in the house. Patient is  appropriate for outpatient follow up.     Collaboration of Care: Collaboration of Care: {BH OP Collaboration of Care:21014065}  Patient/Guardian was advised Release of Information must be obtained prior to any record release in order to collaborate their care with an outside provider. Patient/Guardian was advised if they have not already done so to contact the registration department to sign all necessary forms in order for [place] to release information regarding their care.   Consent: Patient/Guardian gives verbal consent for treatment and assignment of benefits for services provided during this visit. Patient/Guardian expressed understanding and agreed to proceed.    Neysa Hotter, MD 04/28/2023, 8:18 AM

## 2023-05-05 ENCOUNTER — Ambulatory Visit: Admitting: Psychiatry

## 2023-05-05 NOTE — Telephone Encounter (Signed)
Please contact him

## 2023-05-08 ENCOUNTER — Ambulatory Visit: Payer: Medicare HMO | Admitting: Radiation Oncology

## 2023-05-10 ENCOUNTER — Other Ambulatory Visit: Payer: Self-pay | Admitting: Psychiatry

## 2023-05-15 ENCOUNTER — Other Ambulatory Visit: Payer: Self-pay | Admitting: Psychiatry

## 2023-05-15 MED ORDER — SERTRALINE HCL 100 MG PO TABS: 100.0000 mg | ORAL_TABLET | Freq: Every day | ORAL | 0 refills | Status: DC

## 2023-05-17 ENCOUNTER — Emergency Department

## 2023-05-17 ENCOUNTER — Other Ambulatory Visit: Payer: Self-pay

## 2023-05-17 ENCOUNTER — Inpatient Hospital Stay
Admission: EM | Admit: 2023-05-17 | Discharge: 2023-05-30 | DRG: 377 | Disposition: A | Discharge status: Home Health | Attending: Internal Medicine | Admitting: Internal Medicine

## 2023-05-17 DIAGNOSIS — Z880 Allergy status to penicillin: Secondary | ICD-10-CM

## 2023-05-17 DIAGNOSIS — F209 Schizophrenia, unspecified: Secondary | ICD-10-CM | POA: Diagnosis present

## 2023-05-17 DIAGNOSIS — Z8679 Personal history of other diseases of the circulatory system: Secondary | ICD-10-CM

## 2023-05-17 DIAGNOSIS — Q438 Other specified congenital malformations of intestine: Secondary | ICD-10-CM

## 2023-05-17 DIAGNOSIS — W19XXXA Unspecified fall, initial encounter: Secondary | ICD-10-CM | POA: Diagnosis not present

## 2023-05-17 DIAGNOSIS — F03A4 Unspecified dementia, mild, with anxiety: Secondary | ICD-10-CM | POA: Diagnosis present

## 2023-05-17 DIAGNOSIS — Z8249 Family history of ischemic heart disease and other diseases of the circulatory system: Secondary | ICD-10-CM

## 2023-05-17 DIAGNOSIS — E509 Vitamin A deficiency, unspecified: Secondary | ICD-10-CM | POA: Diagnosis present

## 2023-05-17 DIAGNOSIS — K922 Gastrointestinal hemorrhage, unspecified: Secondary | ICD-10-CM

## 2023-05-17 DIAGNOSIS — I5033 Acute on chronic diastolic (congestive) heart failure: Secondary | ICD-10-CM | POA: Diagnosis present

## 2023-05-17 DIAGNOSIS — F03A3 Unspecified dementia, mild, with mood disturbance: Secondary | ICD-10-CM | POA: Diagnosis present

## 2023-05-17 DIAGNOSIS — Z825 Family history of asthma and other chronic lower respiratory diseases: Secondary | ICD-10-CM

## 2023-05-17 DIAGNOSIS — K449 Diaphragmatic hernia without obstruction or gangrene: Secondary | ICD-10-CM | POA: Diagnosis present

## 2023-05-17 DIAGNOSIS — D630 Anemia in neoplastic disease: Secondary | ICD-10-CM | POA: Diagnosis present

## 2023-05-17 DIAGNOSIS — Z681 Body mass index (BMI) 19 or less, adult: Secondary | ICD-10-CM

## 2023-05-17 DIAGNOSIS — R531 Weakness: Secondary | ICD-10-CM | POA: Diagnosis not present

## 2023-05-17 DIAGNOSIS — D62 Acute posthemorrhagic anemia: Secondary | ICD-10-CM | POA: Diagnosis not present

## 2023-05-17 DIAGNOSIS — Z79899 Other long term (current) drug therapy: Secondary | ICD-10-CM

## 2023-05-17 DIAGNOSIS — I509 Heart failure, unspecified: Secondary | ICD-10-CM

## 2023-05-17 DIAGNOSIS — M8580 Other specified disorders of bone density and structure, unspecified site: Secondary | ICD-10-CM | POA: Diagnosis present

## 2023-05-17 DIAGNOSIS — K317 Polyp of stomach and duodenum: Secondary | ICD-10-CM | POA: Diagnosis present

## 2023-05-17 DIAGNOSIS — Z9049 Acquired absence of other specified parts of digestive tract: Secondary | ICD-10-CM

## 2023-05-17 DIAGNOSIS — Z1721 Progesterone receptor positive status: Secondary | ICD-10-CM

## 2023-05-17 DIAGNOSIS — C787 Secondary malignant neoplasm of liver and intrahepatic bile duct: Secondary | ICD-10-CM | POA: Diagnosis present

## 2023-05-17 DIAGNOSIS — Z1732 Human epidermal growth factor receptor 2 negative status: Secondary | ICD-10-CM

## 2023-05-17 DIAGNOSIS — Z90710 Acquired absence of both cervix and uterus: Secondary | ICD-10-CM

## 2023-05-17 DIAGNOSIS — M1612 Unilateral primary osteoarthritis, left hip: Secondary | ICD-10-CM | POA: Diagnosis present

## 2023-05-17 DIAGNOSIS — C7951 Secondary malignant neoplasm of bone: Secondary | ICD-10-CM | POA: Diagnosis present

## 2023-05-17 DIAGNOSIS — Z87891 Personal history of nicotine dependence: Secondary | ICD-10-CM

## 2023-05-17 DIAGNOSIS — K640 First degree hemorrhoids: Secondary | ICD-10-CM | POA: Diagnosis present

## 2023-05-17 DIAGNOSIS — D509 Iron deficiency anemia, unspecified: Secondary | ICD-10-CM | POA: Diagnosis present

## 2023-05-17 DIAGNOSIS — E039 Hypothyroidism, unspecified: Secondary | ICD-10-CM | POA: Diagnosis present

## 2023-05-17 DIAGNOSIS — E876 Hypokalemia: Secondary | ICD-10-CM | POA: Diagnosis present

## 2023-05-17 DIAGNOSIS — E8809 Other disorders of plasma-protein metabolism, not elsewhere classified: Secondary | ICD-10-CM | POA: Diagnosis present

## 2023-05-17 DIAGNOSIS — Z79890 Hormone replacement therapy (postmenopausal): Secondary | ICD-10-CM

## 2023-05-17 DIAGNOSIS — I890 Lymphedema, not elsewhere classified: Secondary | ICD-10-CM | POA: Diagnosis present

## 2023-05-17 DIAGNOSIS — I110 Hypertensive heart disease with heart failure: Secondary | ICD-10-CM | POA: Diagnosis present

## 2023-05-17 DIAGNOSIS — N179 Acute kidney failure, unspecified: Secondary | ICD-10-CM | POA: Diagnosis not present

## 2023-05-17 DIAGNOSIS — M797 Fibromyalgia: Secondary | ICD-10-CM | POA: Diagnosis present

## 2023-05-17 DIAGNOSIS — K6389 Other specified diseases of intestine: Secondary | ICD-10-CM

## 2023-05-17 DIAGNOSIS — K5521 Angiodysplasia of colon with hemorrhage: Secondary | ICD-10-CM | POA: Diagnosis present

## 2023-05-17 DIAGNOSIS — E559 Vitamin D deficiency, unspecified: Secondary | ICD-10-CM | POA: Diagnosis present

## 2023-05-17 DIAGNOSIS — Z803 Family history of malignant neoplasm of breast: Secondary | ICD-10-CM

## 2023-05-17 DIAGNOSIS — Z87442 Personal history of urinary calculi: Secondary | ICD-10-CM

## 2023-05-17 DIAGNOSIS — F410 Panic disorder [episodic paroxysmal anxiety] without agoraphobia: Secondary | ICD-10-CM | POA: Diagnosis present

## 2023-05-17 DIAGNOSIS — Y92009 Unspecified place in unspecified non-institutional (private) residence as the place of occurrence of the external cause: Secondary | ICD-10-CM

## 2023-05-17 DIAGNOSIS — E785 Hyperlipidemia, unspecified: Secondary | ICD-10-CM | POA: Diagnosis present

## 2023-05-17 DIAGNOSIS — D649 Anemia, unspecified: Secondary | ICD-10-CM

## 2023-05-17 DIAGNOSIS — Z604 Social exclusion and rejection: Secondary | ICD-10-CM | POA: Diagnosis present

## 2023-05-17 DIAGNOSIS — K921 Melena: Principal | ICD-10-CM | POA: Diagnosis present

## 2023-05-17 DIAGNOSIS — Z170 Estrogen receptor positive status [ER+]: Secondary | ICD-10-CM

## 2023-05-17 DIAGNOSIS — F319 Bipolar disorder, unspecified: Secondary | ICD-10-CM | POA: Diagnosis present

## 2023-05-17 DIAGNOSIS — Z9011 Acquired absence of right breast and nipple: Secondary | ICD-10-CM

## 2023-05-17 DIAGNOSIS — Z66 Do not resuscitate: Secondary | ICD-10-CM | POA: Diagnosis present

## 2023-05-17 DIAGNOSIS — Z7983 Long term (current) use of bisphosphonates: Secondary | ICD-10-CM

## 2023-05-17 DIAGNOSIS — R296 Repeated falls: Secondary | ICD-10-CM | POA: Diagnosis present

## 2023-05-17 DIAGNOSIS — F332 Major depressive disorder, recurrent severe without psychotic features: Secondary | ICD-10-CM | POA: Diagnosis present

## 2023-05-17 DIAGNOSIS — E43 Unspecified severe protein-calorie malnutrition: Secondary | ICD-10-CM | POA: Diagnosis present

## 2023-05-17 DIAGNOSIS — Z8616 Personal history of COVID-19: Secondary | ICD-10-CM

## 2023-05-17 DIAGNOSIS — Z853 Personal history of malignant neoplasm of breast: Secondary | ICD-10-CM

## 2023-05-17 HISTORY — DX: Unspecified osteoarthritis, unspecified site: M19.90

## 2023-05-17 HISTORY — DX: Unspecified dementia, unspecified severity, without behavioral disturbance, psychotic disturbance, mood disturbance, and anxiety: F03.90

## 2023-05-17 LAB — COMPREHENSIVE METABOLIC PANEL WITH GFR
ALT: 27 U/L (ref 0–44)
AST: 91 U/L — ABNORMAL HIGH (ref 15–41)
Albumin: 2.2 g/dL — ABNORMAL LOW (ref 3.5–5.0)
Alkaline Phosphatase: 281 U/L — ABNORMAL HIGH (ref 38–126)
Anion gap: 13 (ref 5–15)
BUN: 32 mg/dL — ABNORMAL HIGH (ref 8–23)
CO2: 19 mmol/L — ABNORMAL LOW (ref 22–32)
Calcium: 7.9 mg/dL — ABNORMAL LOW (ref 8.9–10.3)
Chloride: 109 mmol/L (ref 98–111)
Creatinine, Ser: 0.93 mg/dL (ref 0.44–1.00)
GFR, Estimated: >60 mL/min (ref >60)
Glucose, Bld: 124 mg/dL — ABNORMAL HIGH (ref 70–99)
Potassium: 3.4 mmol/L — ABNORMAL LOW (ref 3.5–5.1)
Sodium: 141 mmol/L (ref 135–145)
Total Bilirubin: 0.6 mg/dL (ref 0.0–1.2)
Total Protein: 6.4 g/dL — ABNORMAL LOW (ref 6.5–8.1)

## 2023-05-17 LAB — TSH: TSH: 5.246 u[IU]/mL — ABNORMAL HIGH (ref 0.350–4.500)

## 2023-05-17 LAB — TROPONIN I (HIGH SENSITIVITY): Troponin I (High Sensitivity): 21 ng/L — ABNORMAL HIGH (ref <18)

## 2023-05-17 LAB — CBC
HCT: 25.3 % — ABNORMAL LOW (ref 36.0–46.0)
Hemoglobin: 8 g/dL — ABNORMAL LOW (ref 12.0–15.0)
MCH: 27.9 pg (ref 26.0–34.0)
MCHC: 31.6 g/dL (ref 30.0–36.0)
MCV: 88.2 fL (ref 80.0–100.0)
Platelets: 411 10*3/uL — ABNORMAL HIGH (ref 150–400)
RBC: 2.87 MIL/uL — ABNORMAL LOW (ref 3.87–5.11)
RDW: 21.3 % — ABNORMAL HIGH (ref 11.5–15.5)
WBC: 7.6 10*3/uL (ref 4.0–10.5)
nRBC: 0.4 % — ABNORMAL HIGH (ref 0.0–0.2)

## 2023-05-17 LAB — LIPASE, BLOOD: Lipase: 24 U/L (ref 11–51)

## 2023-05-17 LAB — BRAIN NATRIURETIC PEPTIDE: B Natriuretic Peptide: 311.6 pg/mL — ABNORMAL HIGH (ref 0.0–100.0)

## 2023-05-17 LAB — HEMOGLOBIN AND HEMATOCRIT, BLOOD
HCT: 21.8 % — ABNORMAL LOW (ref 36.0–46.0)
HCT: 25.2 % — ABNORMAL LOW (ref 36.0–46.0)
Hemoglobin: 7.1 g/dL — ABNORMAL LOW (ref 12.0–15.0)
Hemoglobin: 8.6 g/dL — ABNORMAL LOW (ref 12.0–15.0)

## 2023-05-17 LAB — PREPARE RBC (CROSSMATCH)

## 2023-05-17 MED ORDER — FUROSEMIDE 10 MG/ML IJ SOLN
40.0000 mg | Freq: Two times a day (BID) | INTRAMUSCULAR | Status: AC
  Administered 2023-05-17 – 2023-05-18 (×2): 40 mg via INTRAVENOUS
  Filled 2023-05-17 (×2): qty 4

## 2023-05-17 MED ORDER — PANTOPRAZOLE SODIUM 40 MG IV SOLR
40.0000 mg | Freq: Two times a day (BID) | INTRAVENOUS | Status: DC
  Administered 2023-05-17 – 2023-05-19 (×4): 40 mg via INTRAVENOUS
  Filled 2023-05-17 (×4): qty 10

## 2023-05-17 MED ORDER — SODIUM CHLORIDE 0.9 % IV SOLN: 250.0000 mL | INTRAVENOUS | Status: AC | PRN

## 2023-05-17 MED ORDER — FUROSEMIDE 40 MG PO TABS
40.0000 mg | ORAL_TABLET | Freq: Every day | ORAL | Status: DC
  Administered 2023-05-18 – 2023-05-19 (×2): 40 mg via ORAL
  Filled 2023-05-17 (×2): qty 1

## 2023-05-17 MED ORDER — SODIUM CHLORIDE 0.9 % IV BOLUS: 1000.0000 mL | Freq: Once | INTRAVENOUS | Status: DC

## 2023-05-17 MED ORDER — ACETAMINOPHEN 325 MG PO TABS
650.0000 mg | ORAL_TABLET | Freq: Four times a day (QID) | ORAL | Status: DC | PRN
  Administered 2023-05-17: 650 mg via ORAL
  Filled 2023-05-17: qty 2

## 2023-05-17 MED ORDER — SERTRALINE HCL 50 MG PO TABS
100.0000 mg | ORAL_TABLET | Freq: Every day | ORAL | Status: DC
Start: 2023-05-17 — End: 2023-05-30
  Administered 2023-05-17 – 2023-05-29 (×11): 100 mg via ORAL
  Filled 2023-05-17 (×13): qty 2

## 2023-05-17 MED ORDER — PANTOPRAZOLE SODIUM 40 MG IV SOLR
40.0000 mg | Freq: Once | INTRAVENOUS | Status: AC
  Administered 2023-05-17: 40 mg via INTRAVENOUS
  Filled 2023-05-17: qty 10

## 2023-05-17 MED ORDER — SODIUM CHLORIDE 0.9 % IV SOLN
10.0000 mL/h | Freq: Once | INTRAVENOUS | Status: AC
  Administered 2023-05-17: 10 mL/h via INTRAVENOUS

## 2023-05-17 MED ORDER — SODIUM CHLORIDE 0.9% FLUSH
3.0000 mL | Freq: Two times a day (BID) | INTRAVENOUS | Status: DC
  Administered 2023-05-17 – 2023-05-30 (×24): 3 mL via INTRAVENOUS

## 2023-05-17 MED ORDER — QUETIAPINE FUMARATE 25 MG PO TABS
25.0000 mg | ORAL_TABLET | Freq: Every day | ORAL | Status: DC
  Administered 2023-05-17 – 2023-05-29 (×13): 25 mg via ORAL
  Filled 2023-05-17 (×13): qty 1

## 2023-05-17 MED ORDER — ONDANSETRON HCL 4 MG/2ML IJ SOLN: 4.0000 mg | Freq: Four times a day (QID) | INTRAMUSCULAR | Status: DC | PRN

## 2023-05-17 MED ORDER — LEVOTHYROXINE SODIUM 88 MCG PO TABS
88.0000 ug | ORAL_TABLET | Freq: Every day | ORAL | Status: DC
  Administered 2023-05-18 – 2023-05-21 (×4): 88 ug via ORAL
  Filled 2023-05-17 (×4): qty 1

## 2023-05-17 MED ORDER — FUROSEMIDE 10 MG/ML IJ SOLN
20.0000 mg | Freq: Once | INTRAMUSCULAR | Status: AC
  Administered 2023-05-17: 20 mg via INTRAVENOUS
  Filled 2023-05-17: qty 4

## 2023-05-17 MED ORDER — ONDANSETRON HCL 4 MG PO TABS
4.0000 mg | ORAL_TABLET | Freq: Four times a day (QID) | ORAL | Status: DC | PRN
  Administered 2023-05-27: 4 mg via ORAL
  Filled 2023-05-17: qty 1

## 2023-05-17 MED ORDER — SODIUM CHLORIDE 0.9% FLUSH: 3.0000 mL | INTRAVENOUS | Status: DC | PRN

## 2023-05-17 NOTE — Plan of Care (Addendum)
 Patient is alert and oriented X 4. Blood consent done. Blood is running at the rate of 120 ml/hrs. Pt is tolerating well. Denies any pain.  Problem: Education: Goal: Knowledge of General Education information will improve Description: Including pain rating scale, medication(s)/side effects and non-pharmacologic comfort measures Outcome: Progressing

## 2023-05-17 NOTE — Assessment & Plan Note (Signed)
Cont zoloft 

## 2023-05-17 NOTE — H&P (Addendum)
 History and Physical    Patient: Elizabeth [name]:[id number] DOB: 09-14-1950 DOA: 05/17/2023 DOS: the patient was seen and examined on 05/17/2023 PCP: [facility]  Patient coming from: Home  Chief Complaint:  Chief Complaint  Patient presents with   Fall   Diarrhea   HPI: Elizabeth [name] is a 73 y.o. female with medical history significant of hypothyroidism, GI bleeding, bipolar, schizophrenia, panic disorder, depression with anxiety, anemia, fibromyalgia, breast cancer (s/p for right mastectomy), right lymphedema presenting with weakness, symptomatic anemia, upper GI bleed, heart failure.  Patient reports worsening shortness of breath over multiple weeks.  Was recent diagnosed with COVID roughly 2 weeks ago.  Upper respiratory symptoms have resolved though weakness has persisted.  Positive orthopnea PND.  Minimal chest pain.  No abdominal pain.  No nausea.?  Intermittent loose stools.  Positive falls at home.  No reported head trauma or LOC.  No focal hemiparesis or confusion. Presented to the ER afebrile, hemodynamically stable.  White count 7.6, hemoglobin 8, platelets 411, creatinine 0.93, glucose 124, AST 91, alk phos 281.  CT head pelvis grossly stable.  Chest x-ray with cardiomegaly and vascular congestion.  Dr. Emerick Hanlon with gastroenterology consulted. Review of Systems: As mentioned in the history of present illness. All other systems reviewed and are negative. Past Medical History:  Diagnosis Date   Acute cholecystitis 2016   Anemia    Anemia 2016   Anxiety    panic attacks   Anxiety and depression    Arthritis    Bipolar 1 disorder (HCC)    Bowel obstruction (HCC) 2017   Common bile duct dilatation 2015   Dementia (HCC)    Dyspnea    Edema    Fibromyalgia    GI bleed 2017   History of kidney stones 01/21/2009   HSV-1 infection    Hyperglycemia    Hyperlipidemia    Hypokalemia    Hypothyroidism    Liver cyst    Malignant neoplasm of right  female breast, unspecified estrogen receptor status, unspecified site of breast (HCC)    Memory loss    Osteopenia    Panic disorder    Schizophrenia (HCC)    Ventral hernia    Past Surgical History:  Procedure Laterality Date   ABDOMINAL HYSTERECTOMY     APPENDECTOMY     BREAST BIOPSY Right 01/19/2021   US  Bx, ribbon, path pending   BREAST BIOPSY Right 01/19/2021   US  Bx, Venus, path pending   BREAST BIOPSY Right 01/19/2021   US  Bx, heart, path pending   BREAST BIOPSY Right 01/19/2021   US  BX Axilla, Coil, path pending   CESAREAN SECTION     CHOLECYSTECTOMY OPEN  11/17/2013   with repair of bile duct-Dr. Adele Admire   MASTECTOMY MODIFIED RADICAL Right 03/02/2021   Procedure: MASTECTOMY MODIFIED RADICAL;  Surgeon: Conrado Delay, DO;  Location: [facility];  Service: General;  Laterality: Right;   OPEN REDUCTION INTERNAL FIXATION (ORIF) DISTAL RADIAL FRACTURE Right 12/20/2014   Procedure: OPEN REDUCTION INTERNAL FIXATION (ORIF) RIGHT DISTAL RADIAL FRACTURE;  Surgeon: Saundra Curl, MD;  Location: [facility];  Service: Orthopedics;  Laterality: Right;   STOMACH SURGERY     Social History:  reports that she quit smoking about 49 years ago. Her smoking use included cigarettes. She has been exposed to tobacco smoke. She has never used smokeless tobacco. She reports that she does not drink alcohol and does not use drugs.  Allergies  Allergen Reactions  Penicillins Hives    TOLERATED CEFAZOLIN     Family History  Problem Relation Age of Onset   Heart disease Father    Breast cancer Paternal Aunt    Kidney disease Paternal Aunt    Breast cancer Paternal Grandmother    Asthma Daughter    Bladder Cancer Neg Hx     Prior to Admission medications   Medication Sig Start Date End Date Taking? Authorizing Provider  acetaminophen  (TYLENOL ) 325 MG tablet Take 2 tablets (650 mg total) by mouth every 6 (six) hours as needed for mild pain, fever or headache. 08/19/22   Montey Apa, DO  alendronate  (FOSAMAX ) 70 MG tablet Take 1 tablet by mouth every Monday. 03/09/20   [provider]  ferrous sulfate  325 (65 FE) MG tablet Take 1 tablet by mouth daily with breakfast. 08/15/21   [provider]  letrozole  (FEMARA ) 2.5 MG tablet Take 1 tablet (2.5 mg total) by mouth daily. 11/28/22   Shellie Dials, MD  levothyroxine  (SYNTHROID , LEVOTHROID) 88 MCG tablet Take 88 mcg by mouth daily before breakfast.    [provider]  lidocaine  (LMX) 4 % cream Apply 1 Application topically as needed. 08/19/22   Montey Apa, DO  midodrine  (PROAMATINE ) 10 MG tablet Take 1 tablet (10 mg total) by mouth 3 (three) times daily with meals. 08/19/22   Montey Apa, DO  midodrine  (PROAMATINE ) 10 MG tablet Take by mouth 3 (three) times daily.    [provider]  QUEtiapine  (SEROQUEL ) 25 MG tablet Take 1 tablet (25 mg total) by mouth at bedtime. 05/15/23 08/13/23  Todd Fossa, MD  sertraline  (ZOLOFT ) 100 MG tablet Take 1 tablet (100 mg total) by mouth at bedtime. 06/08/23 09/06/23  Todd Fossa, MD  Vitamin D , Ergocalciferol , (DRISDOL ) 1.25 MG (50000 UNIT) CAPS capsule Take 50,000 Units by mouth once a week. 07/02/21   [provider]    Physical Exam: Vitals:   05/17/23 1400 05/17/23 1430 05/17/23 1500 05/17/23 1527  BP: 122/67 124/64 132/65 136/63  Pulse:  93 94 98  Resp:    18  Temp:    98.3 F (36.8 C)  TempSrc:      SpO2:  99% 95% 99%  Weight:      Height:       Physical Exam Constitutional:      Appearance: She is normal weight.  HENT:     Head: Normocephalic and atraumatic.     Nose: Nose normal.  Eyes:     Pupils: Pupils are equal, round, and reactive to light.  Cardiovascular:     Rate and Rhythm: Normal rate and regular rhythm.  Pulmonary:     Effort: Pulmonary effort is normal.  Abdominal:     General: Bowel sounds are normal.  Musculoskeletal:        General: Normal range of motion.     Cervical back: Normal  range of motion.  Skin:    General: Skin is warm.  Neurological:     General: No focal deficit present.  Psychiatric:        Mood and Affect: Mood normal.     Data Reviewed:  There are no new results to review at this time.  CT HEAD WO CONTRAST ( ) CLINICAL DATA:  Head trauma. Generalized weakness. Recent unwitnessed fall.  EXAM: CT HEAD WITHOUT CONTRAST  TECHNIQUE: Contiguous axial images were obtained from the base of the skull through the vertex without intravenous contrast.  RADIATION DOSE REDUCTION: This exam  was performed according to the departmental dose-optimization program which includes automated exposure control, adjustment of the mA and/or kV according to patient size and/or use of iterative reconstruction technique.  COMPARISON:  CT head without contrast 08/16/2022.  FINDINGS: Brain: Periventricular and scattered subcortical white matter hypoattenuation is mildly advanced for age. This is similar the prior exam. No acute infarct, hemorrhage, or mass lesion is present. Deep brain nuclei are within normal limits. The ventricles are of normal size. No significant extraaxial fluid collection is present.  The brainstem and cerebellum are within normal limits. Midline structures are within normal limits.  Vascular: Faint atherosclerotic calcifications are present within the cavernous internal carotid arteries and at the dural margin of the right vertebral artery. No hyperdense vessel is present.  Skull: Calvarium is intact. No focal lytic or blastic lesions are present. No significant extracranial soft tissue lesion is present.  Sinuses/Orbits: A fluid level is present left maxillary sinus scratched at a fluid level is present in the left sphenoid sinus. Minimal mucosal thickening is present in the right sphenoid sinus. The paranasal sinuses and mastoid air cells are otherwise clear. The globes and orbits are within normal limits.  IMPRESSION: 1. No  acute intracranial abnormality or significant interval change. 2. Periventricular and scattered subcortical white matter hypoattenuation is mildly advanced for age. This likely reflects the sequela of chronic microvascular ischemia. 3. Fluid levels in the left maxillary and left sphenoid sinuses. This may reflect acute sinusitis in the appropriate clinical setting.  Electronically Signed   By: Audree Leas M.D.   On: 05/17/2023 14:10 DG Chest Portable 1 View CLINICAL DATA:  Weakness.  Recent episode of COVID.  EXAM: PORTABLE CHEST 1 VIEW  COMPARISON:  One-view chest x-ray 03/03/2021.  FINDINGS: The heart is enlarged. Moderate pulmonary vascular congestion is present bilaterally. Small effusions are suspected. No significant airspace consolidation is present. The visualized soft tissues and bony thorax are unremarkable.  IMPRESSION: 1. Cardiomegaly and moderate pulmonary vascular congestion. 2. Small effusions.  Electronically Signed   By: Audree Leas M.D.   On: 05/17/2023 14:06 DG Pelvis 1-2 Views CLINICAL DATA:  Fall.  Dementia.  EXAM: PELVIS - 1-2 VIEW  COMPARISON:  09/03/2020  FINDINGS: Single AP view of the pelvis demonstrates marked left and moderate right hip joint space narrowing with subchondral sclerosis. Given mild osteopenia, no acute fracture identified. Degenerative sclerosis of the right sacroiliac joint. Remote trauma or degenerative irregularity of the left symphysis pubis is new since 2022.  IMPRESSION: Degenerative change, without acute osseous finding.  Electronically Signed   By: Lore Rode M.D.   On: 05/17/2023 14:05  Lab Results  Component Value Date   WBC 7.6 05/17/2023   HGB 8.0 (L) 05/17/2023   HCT 25.3 (L) 05/17/2023   MCV 88.2 05/17/2023   PLT 411 (H) 05/17/2023   Last metabolic panel Lab Results  Component Value Date   GLUCOSE 124 (H) 05/17/2023   NA 141 05/17/2023   K 3.4 (L) 05/17/2023   CL 109  05/17/2023   CO2 19 (L) 05/17/2023   BUN 32 (H) 05/17/2023   CREATININE 0.93 05/17/2023   GFRNONAA >60 05/17/2023   CALCIUM  7.9 (L) 05/17/2023   PHOS 2.9 03/05/2021   PROT 6.4 (L) 05/17/2023   ALBUMIN  2.2 (L) 05/17/2023   BILITOT 0.6 05/17/2023   ALKPHOS 281 (H) 05/17/2023   AST 91 (H) 05/17/2023   ALT 27 05/17/2023   ANIONGAP 13 05/17/2023    Assessment and Plan: Major depressive disorder, recurrent  severe without psychotic features (HCC) Cont zoloft    GI bleed ABLA  Progressive worsening weakness over multiple weeks ? Loose bowel movements Hemoccult +  Downtrending hgb 11-->8 over 3 months  Pending pRBC transfusion in ER  Trend hgb  Anemia panel  IV PPI  Transfuse for hgb <7  GI consult  Monitor   Weakness Fall  Generalized progressive weakness and falls at home over multiple weeks  Likely multifactorial w/ GI bleed, symptomatic anemia, post viral window, CHF  Nonfocal neuro exam  CT head WNL  Pending pRBC transfusion  Gentle diuresis  Fall precautions  PT/OT evaluation      Heart failure (HCC) Progressive weakness and shortness of breath over multiple weeks Noted cardiomegaly and vascular congestion on chest x-ray Trace edema in lower extremities Will diurese in the setting of symptomatic anemia pending PRBC transfusion BNP pending 2D echo Monitor volume status closely Cardiology consultation as appropriate  Hypothyroidism Cont synthroid   Check TSH    History of essential hypertension BP stable  Titrate home regimen     Greater than 50% was spent in counseling and coordination of care with patient Total encounter time 80 minutes or more    Advance Care Planning:   Code Status: Full Code   Consults: GI   Family Communication: No family at the bedside   Severity of Illness: The appropriate patient status for this patient is OBSERVATION. Observation status is judged to be reasonable and necessary in order to provide the required intensity  of service to ensure the patient's safety. The patient's presenting symptoms, physical exam findings, and initial radiographic and laboratory data in the context of their medical condition is felt to place them at decreased risk for further clinical deterioration. Furthermore, it is anticipated that the patient will be medically stable for discharge from the hospital within 2 midnights of admission.   Author: Corrinne Din, MD 05/17/2023 3:44 PM  For on call review [email or web address].

## 2023-05-17 NOTE — Assessment & Plan Note (Signed)
 Fall  Generalized progressive weakness and falls at home over multiple weeks  Likely multifactorial w/ GI bleed, symptomatic anemia, post viral window, CHF  Nonfocal neuro exam  CT head WNL  Pending pRBC transfusion  Gentle diuresis  Fall precautions  PT/OT evaluation

## 2023-05-17 NOTE — ED Provider Notes (Addendum)
 [facility] Provider Note    Event Date/Time   First MD Initiated Contact with Patient 05/17/23 1243     (approximate)  History   Chief Complaint: Fall and Diarrhea  HPI  Elizabeth [name] is a 73 y.o. female with a past medical history of mild dementia, anemia, bipolar, schizophrenia, presents to the emergency department for generalized weakness and deconditioning.  According to the patient's friend who is here with the patient, she was diagnosed with COVID around 2-1/2 weeks ago, became very weak however has not entirely improved and continues to be weak to the point where she is having trouble walking without assistance.  The friend states he takes care of her on the weekends and they have Monday through Friday home health aide in the house with her to help care for her as well.  He states the patient did have a fall today due to weakness so the brought her to the emergency department for evaluation.  Patient and friend deny any recent fever, denies any recent cough or congestion.  No vomiting, but does state occasional diarrhea but that is longstanding.  Physical Exam   Triage Vital Signs: ED Triage Vitals  Encounter Vitals Group     BP 05/17/23 1236 108/63     Systolic BP Percentile --      Diastolic BP Percentile --      Pulse Rate 05/17/23 1236 (!) 104     Resp 05/17/23 1236 20     Temp 05/17/23 1236 98.4 F (36.9 C)     Temp Source 05/17/23 1236 Oral     SpO2 05/17/23 1236 97 %     Weight 05/17/23 1235 101 lb (45.8 kg)     Height 05/17/23 1235 5' (1.524 m)     Head Circumference --      Peak Flow --      Pain Score --      Pain Loc --      Pain Education --      Exclude from Growth Chart --     Most recent vital signs: Vitals:   05/17/23 1236  BP: 108/63  Pulse: (!) 104  Resp: 20  Temp: 98.4 F (36.9 C)  SpO2: 97%    General: Awake, no distress.  CV:  Good peripheral perfusion.  Regular rate and rhythm  Resp:  Normal effort.   Equal breath sounds bilaterally.  Abd:  No distention.  Soft, nontender.  No rebound or guarding. Other:  Good range of motion in all extremities.  No significant deficit identified on examination.  ED Results / Procedures / Treatments   RADIOLOGY  I have reviewed interpreted CT head images.  No large bleed seen on my evaluation. Radiologist read the CT scan is negative for acute abnormality.  Patient's chest x-ray shows cardiomegaly with moderate vascular congestion and small effusions.  Patient's pelvis x-ray is negative.  MEDICATIONS ORDERED IN ED: Medications  sodium chloride  0.9 % bolus 1,000 mL (has no administration in time range)     IMPRESSION / MDM / ASSESSMENT AND PLAN / ED COURSE  I reviewed the triage vital signs and the nursing notes.  Patient's presentation is most consistent with acute presentation with potential threat to life or bodily function.  Patient presents to the emergency department for generalized weakness.  Patient had COVID 2 and half weeks ago and since that time was not regained her strength, had a fall today due to weakness so they brought to the  emergency department for evaluation.  Patient has a history of left hip arthritis but states this is worse since the fall.  Good range of motion on exam but we will obtain a pelvis x-ray as a precaution.  Neurovascularly intact.  Good strength in all extremities however given the fall and weakness will obtain a CT scan head.  We will check labs and a urinalysis.  Will IV hydrate continue to closely monitor.  Patient's workup shows vascular congestion on her chest x-ray with small effusions.  Patient CBC shows a hemoglobin of 8.0.  I reviewed the patient's care everywhere chart this is down from 11.3 3 months ago.  Patient's friend who is with her states she has been complaining of very black stool recently.  Rectal examination performed by myself shows dark stool strongly guaiac positive.  Suspect acute blood loss  has led to symptomatic anemia which is contributing to the patient's weakness.  May also be causing high-output heart failure.  Given the patient's symptomatic anemia we will transfuse 1 unit of packed red blood cells.  Discussed with the patient and her caregiver they are agreeable.  Will admit to the hospital service for further workup and treatment.  CRITICAL CARE Performed by: Ruth Cove   Total critical care time: 30 minutes  Critical care time was exclusive of separately billable procedures and treating other patients.  Critical care was necessary to treat or prevent imminent or life-threatening deterioration.  Critical care was time spent personally by me on the following activities: development of treatment plan with patient and/or surrogate as well as nursing, discussions with consultants, evaluation of patient's response to treatment, examination of patient, obtaining history from patient or surrogate, ordering and performing treatments and interventions, ordering and review of laboratory studies, ordering and review of radiographic studies, pulse oximetry and re-evaluation of patient's condition.   FINAL CLINICAL IMPRESSION(S) / ED DIAGNOSES   Weakness Symptomatic anemia GI bleed   Note:  This document was prepared using Dragon voice recognition software and may include unintentional dictation errors.   Ruth Cove, MD 05/17/23 1440    Ruth Cove, MD 05/17/23 1442

## 2023-05-17 NOTE — Assessment & Plan Note (Addendum)
 Progressive weakness and shortness of breath over multiple weeks Noted cardiomegaly and vascular congestion on chest x-ray Trace edema in lower extremities Will diurese in the setting of symptomatic anemia pending PRBC transfusion BNP pending 2D echo Monitor volume status closely Cardiology consultation as appropriate

## 2023-05-17 NOTE — Assessment & Plan Note (Addendum)
 ABLA  Progressive worsening weakness over multiple weeks ? Loose bowel movements Hemoccult +  Downtrending hgb 11-->8 over 3 months  Pending pRBC transfusion in ER  Trend hgb  Anemia panel  IV PPI  Transfuse for hgb <7  GI consult  Monitor

## 2023-05-17 NOTE — Progress Notes (Signed)
 Blood transfusion completed.

## 2023-05-17 NOTE — ED Triage Notes (Signed)
 Pt to ED with friend for generalized weakness and thin diarrhea since had covid 2.5 weeks ago. Had a few unwitnessed falls while had covid recently. Pt has dementia. Son of pt lives in [place] and has a list of FYI on friend's phone to show MD including multiple mental health hx, dementia. Unsure # diarrheal episodes. Pt states has been weak to L side since 3 weeks ago.

## 2023-05-17 NOTE — Assessment & Plan Note (Signed)
 Cont synthroid.   Check TSH

## 2023-05-17 NOTE — Assessment & Plan Note (Signed)
 BP stable Titrate home regimen

## 2023-05-18 ENCOUNTER — Observation Stay (HOSPITAL_BASED_OUTPATIENT_CLINIC_OR_DEPARTMENT_OTHER)
Admit: 2023-05-18 | Discharge: 2023-05-18 | Disposition: A | Discharge status: Home / Self Care | Attending: Family Medicine | Admitting: Family Medicine

## 2023-05-18 DIAGNOSIS — R531 Weakness: Secondary | ICD-10-CM | POA: Diagnosis not present

## 2023-05-18 DIAGNOSIS — I5031 Acute diastolic (congestive) heart failure: Secondary | ICD-10-CM | POA: Diagnosis not present

## 2023-05-18 LAB — GASTROINTESTINAL PANEL BY PCR, STOOL (REPLACES STOOL CULTURE)

## 2023-05-18 LAB — TYPE AND SCREEN
ABO/RH(D): O POS
Antibody Screen: NEGATIVE
Unit division: 0

## 2023-05-18 LAB — COMPREHENSIVE METABOLIC PANEL WITH GFR
ALT: 24 U/L (ref 0–44)
AST: 84 U/L — ABNORMAL HIGH (ref 15–41)
Albumin: 2 g/dL — ABNORMAL LOW (ref 3.5–5.0)
Alkaline Phosphatase: 258 U/L — ABNORMAL HIGH (ref 38–126)
Anion gap: 11 (ref 5–15)
BUN: 25 mg/dL — ABNORMAL HIGH (ref 8–23)
CO2: 24 mmol/L (ref 22–32)
Calcium: 7.7 mg/dL — ABNORMAL LOW (ref 8.9–10.3)
Chloride: 106 mmol/L (ref 98–111)
Creatinine, Ser: 0.79 mg/dL (ref 0.44–1.00)
GFR, Estimated: >60 mL/min (ref >60)
Glucose, Bld: 89 mg/dL (ref 70–99)
Potassium: 3 mmol/L — ABNORMAL LOW (ref 3.5–5.1)
Sodium: 141 mmol/L (ref 135–145)
Total Bilirubin: 0.8 mg/dL (ref 0.0–1.2)
Total Protein: 6.2 g/dL — ABNORMAL LOW (ref 6.5–8.1)

## 2023-05-18 LAB — URINALYSIS, ROUTINE W REFLEX MICROSCOPIC
Bilirubin Urine: NEGATIVE
Glucose, UA: NEGATIVE mg/dL
Ketones, ur: NEGATIVE mg/dL
Nitrite: NEGATIVE
Protein, ur: NEGATIVE mg/dL
Specific Gravity, Urine: 1.009 (ref 1.005–1.030)
Squamous Epithelial / HPF: 0 /HPF (ref 0–5)
pH: 5 (ref 5.0–8.0)

## 2023-05-18 LAB — IRON AND TIBC
Iron: 47 ug/dL (ref 28–170)
Saturation Ratios: 39 % — ABNORMAL HIGH (ref 10.4–31.8)
TIBC: 122 ug/dL — ABNORMAL LOW (ref 250–450)
UIBC: 75 ug/dL

## 2023-05-18 LAB — BPAM RBC
Blood Product Expiration Date: 202505012359
ISSUE DATE / TIME: 202504261752
Unit Type and Rh: 5100

## 2023-05-18 LAB — HEMOGLOBIN AND HEMATOCRIT, BLOOD
HCT: 25.8 % — ABNORMAL LOW (ref 36.0–46.0)
HCT: 29.2 % — ABNORMAL LOW (ref 36.0–46.0)
Hemoglobin: 8.7 g/dL — ABNORMAL LOW (ref 12.0–15.0)
Hemoglobin: 10 g/dL — ABNORMAL LOW (ref 12.0–15.0)

## 2023-05-18 LAB — MAGNESIUM: Magnesium: 2 mg/dL (ref 1.7–2.4)

## 2023-05-18 LAB — FERRITIN: Ferritin: 830 ng/mL — ABNORMAL HIGH (ref 11–307)

## 2023-05-18 LAB — PHOSPHORUS: Phosphorus: 2.9 mg/dL (ref 2.5–4.6)

## 2023-05-18 MED ORDER — POTASSIUM CHLORIDE 10 MEQ/100ML IV SOLN
10.0000 meq | INTRAVENOUS | Status: AC
Start: 2023-05-18 — End: 2023-05-18
  Administered 2023-05-18 (×4): 10 meq via INTRAVENOUS
  Filled 2023-05-18 (×4): qty 100

## 2023-05-18 MED ORDER — POTASSIUM CHLORIDE CRYS ER 20 MEQ PO TBCR
40.0000 meq | EXTENDED_RELEASE_TABLET | Freq: Once | ORAL | Status: AC
  Administered 2023-05-18: 40 meq via ORAL
  Filled 2023-05-18: qty 2

## 2023-05-18 NOTE — Evaluation (Signed)
 Physical Therapy Evaluation Patient Details Name: Elizabeth [name] MRN: [id number] DOB: 1950-10-21 Today's Date: 05/18/2023  History of Present Illness  Elizabeth [name] is a 73 y.o. female with medical history significant of hypothyroidism, GI bleeding, bipolar, schizophrenia, panic disorder, depression with anxiety, anemia, fibromyalgia, breast cancer (s/p for right mastectomy), right lymphedema presenting with weakness, symptomatic anemia, upper GI bleed, heart failure.  Patient reports worsening shortness of breath over multiple weeks.  Was recent diagnosed with COVID roughly 2 weeks ago.  Upper respiratory symptoms have resolved though weakness has persisted.  Positive orthopnea PND.  Minimal chest pain.  No abdominal pain.  No nausea.?  Intermittent loose stools.  Positive falls at home.   Clinical Impression  Pt admitted with above diagnosis. Pt currently with functional limitations due to the deficits listed below (see PT Problem List). Pt received upright in bed agreeable to PT. Reports PTA being weaker over the last month due to her medical issues leading up to acute admission. Pt is mod-I with her rollator and requires assist from care aides that come daily 9-4 pm. Reports significant difficulty entering/exiting her home because she has 13 STE and exit. Anticipate pt is close to her baseline but is minimally participatory completing bed mobility at mod-I and STS to RW and SPT to recliner easily and quickly with no evidence of imbalance. Pt does demonstrate LLE toe walking with transfer but has adequate ankle DF and DF strength so unsure of reason but seems baseline per EMR. Pt unable to elaborate history of it. Pt declining further mobility despite encouragement to assess further function but pt declining. Pt with all needs in reach with lunch tray. Due to limited participation and reports of difficulty navigating steps at home, PT to rec f/u recs of skilled PT < 3 hours/day to maximize  function and return to PLOF.      If plan is discharge home, recommend the following: A little help with walking and/or transfers;A little help with bathing/dressing/bathroom;Assistance with cooking/housework;Assist for transportation;Help with stairs or ramp for entrance   Can travel by private vehicle   Yes    Equipment Recommendations None recommended by PT  Recommendations for Other Services       Functional Status Assessment Patient has had a recent decline in their functional status and demonstrates the ability to make significant improvements in function in a reasonable and predictable amount of time.     Precautions / Restrictions Precautions Precautions: Fall Restrictions Weight Bearing Restrictions Per Provider Order: No      Mobility  Bed Mobility Overal bed mobility: Modified Independent             General bed mobility comments: with bed features Patient Response: Cooperative  Transfers Overall transfer level: Needs assistance Equipment used: Rolling walker (2 wheels) Transfers: Sit to/from Stand, Bed to chair/wheelchair/BSC Sit to Stand: Supervision   Step pivot transfers: Supervision       General transfer comment: used RW well. Very easy transfer for pt.    Ambulation/Gait               General Gait Details: pt declining gait bouts despite encouragement to truly assess function for household mobility.  Stairs            Wheelchair Mobility     Tilt Bed Tilt Bed Patient Response: Cooperative  Modified Rankin (Stroke Patients Only)       Balance Overall balance assessment: Needs assistance Sitting-balance support: Bilateral upper extremity supported, Feet supported Sitting  balance-Leahy Scale: Fair     Standing balance support: Bilateral upper extremity supported, During functional activity Standing balance-Leahy Scale: Fair Standing balance comment: using RW                             Pertinent  Vitals/Pain Pain Assessment Pain Assessment: No/denies pain    Home Living Family/patient expects to be discharged to:: Private residence Living Arrangements: Alone Available Help at Discharge: Personal care attendant;Other (Comment) (Personal care aide daily from 9 am to 4 pm) Type of Home: House Home Access: Stairs to enter Entrance Stairs-Rails: Right;Left Entrance Stairs-Number of Steps: 13   Home Layout: Two level;Able to live on main level with bedroom/bathroom Home Equipment: Rollator (4 wheels)      Prior Function Prior Level of Function : Needs assist             Mobility Comments: reports now using rollator for all mobility. Reports extreme difficulty with steps over the last month.       Extremity/Trunk Assessment   Upper Extremity Assessment Upper Extremity Assessment: Overall WFL for tasks assessed    Lower Extremity Assessment Lower Extremity Assessment: Overall WFL for tasks assessed    Cervical / Trunk Assessment Cervical / Trunk Assessment: Normal  Communication        Cognition Arousal: Alert Behavior During Therapy: WFL for tasks assessed/performed   PT - Cognitive impairments: No apparent impairments                         Following commands: Intact       Cueing Cueing Techniques: Verbal cues     General Comments      Exercises Other Exercises Other Exercises: Benefits of OOB mobility for functional strengthening and return to PLOF.   Assessment/Plan    PT Assessment Patient needs continued PT services  PT Problem List Decreased strength;Decreased activity tolerance;Decreased balance       PT Treatment Interventions DME instruction;Therapeutic exercise;Gait training;Balance training;Neuromuscular re-education;Stair training;Functional mobility training;Therapeutic activities;Patient/family education    PT Goals (Current goals can be found in the Care Plan section)  Acute Rehab PT Goals Patient Stated Goal: improve  stair mobility for her house PT Goal Formulation: With patient Time For Goal Achievement: 06/01/23 Potential to Achieve Goals: Good    Frequency Min 1X/week     Co-evaluation               AM-PAC PT "6 Clicks" Mobility  Outcome Measure Help needed turning from your back to your side while in a flat bed without using bedrails?: A Little Help needed moving from lying on your back to sitting on the side of a flat bed without using bedrails?: A Little Help needed moving to and from a bed to a chair (including a wheelchair)?: A Little Help needed standing up from a chair using your arms (e.g., wheelchair or bedside chair)?: A Little Help needed to walk in hospital room?: A Little Help needed climbing 3-5 steps with a railing? : A Little 6 Click Score: 18    End of Session Equipment Utilized During Treatment: Gait belt Activity Tolerance: Patient tolerated treatment well Patient left: in chair;with call bell/phone within reach;with chair alarm set Nurse Communication: Mobility status PT Visit Diagnosis: Other abnormalities of gait and mobility (R26.89);Muscle weakness (generalized) (M62.81);History of falling (Z91.81)    Time: 1610-9604 PT Time Calculation (min) (ACUTE ONLY): 18 min   Charges:  PT Evaluation $PT Eval Low Complexity: 1 Low   PT General Charges $$ ACUTE PT VISIT: 1 Visit        Marc Senior. Fairly IV, PT, DPT Physical Therapist- [place]  [facility]  05/18/2023, 1:43 PM

## 2023-05-18 NOTE — Consult Note (Signed)
 Consultation  Referring Provider:     Hospitalist Admit date: 05/17/2023 Consult date: 05/18/2023         Reason for Consultation: Anemia/Dark stool              HPI:   Elizabeth [name] is a 73 y.o. 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 She has not had any GI evaluation since. She had a colonoscopy over 10 years ago of which I don't have any records. She was referred to GI in July of last year for IDA (ferritin 22) with baseline hemoglobin of 10-11. She presents to the hospital for shortness of breath at rest that has severely limited her movement. She was diagnosed with COVID-19 a few weeks ago. Also endorses dark stool after eating. Denies any NSAIDS. No blood thinners. Hemoglobin was low but had supratherapeutic response to one unit of pRBC's as she went from 7 --> 10. BNP elevated and chest x-ray shows pulmonary vascular congestion. Her shortness of breath did not improve with correction of anemia. Echo is pending. Has had dark stool for 3 weeks.   Past Medical History:  Diagnosis Date   Acute cholecystitis 2016   Anemia    Anemia 2016   Anxiety    panic attacks   Anxiety and depression    Arthritis    Bipolar 1 disorder (HCC)    Bowel obstruction (HCC) 2017   Common bile duct dilatation 2015   Dementia (HCC)    Dyspnea    Edema    Fibromyalgia    GI bleed 2017   History of kidney stones 01/21/2009   HSV-1 infection    Hyperglycemia    Hyperlipidemia    Hypokalemia    Hypothyroidism    Liver cyst    Malignant  neoplasm of right female breast, unspecified estrogen receptor status, unspecified site of breast (HCC)    Memory loss    Osteopenia    Panic disorder    Schizophrenia (HCC)    Ventral hernia     Past Surgical History:  Procedure Laterality Date   ABDOMINAL HYSTERECTOMY     APPENDECTOMY     BREAST BIOPSY Right 01/19/2021   US  Bx, ribbon, path pending   BREAST BIOPSY Right 01/19/2021   US  Bx, Venus, path pending   BREAST BIOPSY Right 01/19/2021   US  Bx, heart, path pending   BREAST BIOPSY Right 01/19/2021   US  BX Axilla, Coil, path pending   CESAREAN SECTION     CHOLECYSTECTOMY OPEN  11/17/2013   with repair of bile duct-Dr. Adele Admire   MASTECTOMY MODIFIED RADICAL Right 03/02/2021   Procedure: MASTECTOMY MODIFIED RADICAL;  Surgeon: Conrado Delay, DO;  Location: [facility];  Service: General;  Laterality: Right;   OPEN REDUCTION INTERNAL FIXATION (ORIF) DISTAL RADIAL FRACTURE Right 12/20/2014   Procedure: OPEN REDUCTION INTERNAL FIXATION (ORIF) RIGHT DISTAL RADIAL FRACTURE;  Surgeon: Saundra Curl, MD;  Location: [facility];  Service: Orthopedics;  Laterality: Right;   STOMACH SURGERY      Family History  Problem Relation Age of Onset   Heart disease Father    Breast cancer Paternal  Aunt    Kidney disease Paternal Aunt    Breast cancer Paternal Grandmother    Asthma Daughter    Bladder Cancer Neg Hx     Social History   Tobacco Use   Smoking status: Former    Current packs/day: 0.00    Types: Cigarettes    Quit date: 11/03/1973    Years since quitting: 49.5    Passive exposure: Past   Smokeless tobacco: Never   Tobacco comments:    Smoked briefly as a teen- lives in secondhand smoke.   Vaping Use   Vaping status: Never Used  Substance Use Topics   Alcohol use: No    Alcohol/week: 0.0 standard drinks of alcohol   Drug use: No    Prior to Admission medications   Medication Sig Start Date End Date Taking? Authorizing Provider  acetaminophen  (TYLENOL )  325 MG tablet Take 2 tablets (650 mg total) by mouth every 6 (six) hours as needed for mild pain, fever or headache. 08/19/22   Montey Apa, DO  alendronate  (FOSAMAX ) 70 MG tablet Take 1 tablet by mouth every Monday. 03/09/20   [provider]  ferrous sulfate  325 (65 FE) MG tablet Take 1 tablet by mouth daily with breakfast. 08/15/21   [provider]  letrozole  (FEMARA ) 2.5 MG tablet Take 1 tablet (2.5 mg total) by mouth daily. 11/28/22   Shellie Dials, MD  levothyroxine  (SYNTHROID , LEVOTHROID) 88 MCG tablet Take 88 mcg by mouth daily before breakfast.    [provider]  lidocaine  (LMX) 4 % cream Apply 1 Application topically as needed. 08/19/22   Montey Apa, DO  midodrine  (PROAMATINE ) 10 MG tablet Take 1 tablet (10 mg total) by mouth 3 (three) times daily with meals. 08/19/22   Montey Apa, DO  midodrine  (PROAMATINE ) 10 MG tablet Take by mouth 3 (three) times daily.    [provider]  QUEtiapine  (SEROQUEL ) 25 MG tablet Take 1 tablet (25 mg total) by mouth at bedtime. 05/15/23 08/13/23  Todd Fossa, MD  sertraline  (ZOLOFT ) 100 MG tablet Take 1 tablet (100 mg total) by mouth at bedtime. 06/08/23 09/06/23  Todd Fossa, MD  Vitamin D , Ergocalciferol , (DRISDOL ) 1.25 MG (50000 UNIT) CAPS capsule Take 50,000 Units by mouth once a week. 07/02/21   [provider]    Current Facility-Administered Medications  Medication Dose Route Frequency Provider Last Rate Last Admin   0.9 %  sodium chloride  infusion  250 mL Intravenous PRN Corrinne Din, MD       acetaminophen  (TYLENOL ) tablet 650 mg  650 mg Oral Q6H PRN Newton, Steven J, MD   650 mg at 05/17/23 2030   furosemide (LASIX) tablet 40 mg  40 mg Oral Daily Corrinne Din, MD       levothyroxine  (SYNTHROID ) tablet 88 mcg  88 mcg Oral QAC breakfast Corrinne Din, MD   88 mcg at 05/18/23 0542   ondansetron  (ZOFRAN ) tablet 4 mg  4 mg Oral Q6H PRN Newton, Steven J, MD       Or    ondansetron  (ZOFRAN ) injection 4 mg  4 mg Intravenous Q6H PRN Newton, Steven J, MD       pantoprazole  (PROTONIX ) injection 40 mg  40 mg Intravenous Q12H Corrinne Din, MD   40 mg at 05/18/23 2956   QUEtiapine  (SEROQUEL ) tablet 25 mg  25 mg Oral QHS Corrinne Din, MD   25 mg at 05/17/23 2030   sertraline  (ZOLOFT ) tablet 100 mg  100 mg Oral QHS Corrinne Din, MD   100 mg at 05/17/23 2030   sodium chloride  flush (NS) 0.9 % injection 3 mL  3 mL Intravenous Q12H Corrinne Din, MD   3 mL at 05/18/23 0818   sodium chloride  flush (NS) 0.9 % injection 3 mL  3 mL Intravenous PRN Corrinne Din, MD        Allergies as of 05/17/2023 - Review Complete 05/17/2023  Allergen Reaction Noted   Penicillins Hives 11/03/2013     Review of Systems:    All systems reviewed and negative except where noted in HPI.  Review of Systems  Constitutional:  Positive for malaise/fatigue. Negative for chills and fever.  Respiratory:  Positive for shortness of breath.   Cardiovascular:  Positive for chest pain.  Gastrointestinal:  Positive for abdominal pain, constipation and diarrhea. Negative for nausea.  Musculoskeletal:  Positive for joint pain and myalgias.  Neurological:  Negative for focal weakness.  Psychiatric/Behavioral:  Negative for substance abuse.   All other systems reviewed and are negative.      Physical Exam:  Vital signs in last 24 hours: Temp:  [97.7 F (36.5 C)-98.7 F (37.1 C)] 98.2 F (36.8 C) (04/27 0842) Pulse Rate:  [88-104] 95 (04/27 0842) Resp:  [15-20] 18 (04/27 0842) BP: (108-136)/(57-81) 121/67 (04/27 0842) SpO2:  [91 %-99 %] 96 % (04/27 0842) Weight:  [41.9 kg-45.8 kg] 41.9 kg (04/27 0500) Last BM Date : 05/17/23 General:   Appears fatigued Head:  Normocephalic and atraumatic. Eyes:   No icterus.   Conjunctiva pink. Ears:  Normal auditory acuity. Mouth: Mucosa pink moist, no lesions. Neck:  Supple; no masses felt Lungs:  No respiratory distress Abdomen:    Flat, soft, nondistended, nontender Msk:  Trace edema bilaterally Neurologic:  No focal deficits Skin:  Warm, dry, pink without significant lesions or rashes. Psych:  Alert and cooperative. Normal affect.  LAB RESULTS: Recent Labs    05/17/23 1335 05/17/23 1639 05/17/23 2251 05/18/23 0319 05/18/23 0908  WBC 7.6  --   --   --   --   HGB 8.0*   < > 8.6* 8.7* 10.0*  HCT 25.3*   < > 25.2* 25.8* 29.2*  PLT 411*  --   --   --   --    < > = values in this interval not displayed.   BMET Recent Labs    05/17/23 1335 05/18/23 0319  NA 141 141  K 3.4* 3.0*  CL 109 106  CO2 19* 24  GLUCOSE 124* 89  BUN 32* 25*  CREATININE 0.93 0.79  CALCIUM  7.9* 7.7*   LFT Recent Labs    05/18/23 0319  PROT 6.2*  ALBUMIN  2.0*  AST 84*  ALT 24  ALKPHOS 258*  BILITOT 0.8   PT/INR No results for input(s): "LABPROT", "INR" in the last 72 hours.  STUDIES: CT HEAD WO CONTRAST ( ) Result Date: 05/17/2023 CLINICAL DATA:  Head trauma. Generalized weakness. Recent unwitnessed fall. EXAM: CT HEAD WITHOUT CONTRAST TECHNIQUE: Contiguous axial images were obtained from the base of the skull through the vertex without intravenous contrast. RADIATION DOSE REDUCTION: This exam was performed according to the departmental dose-optimization program which includes automated exposure control, adjustment of the mA and/or kV according to patient size and/or use of iterative reconstruction technique. COMPARISON:  CT head without contrast 08/16/2022. FINDINGS: Brain: Periventricular and scattered subcortical white matter hypoattenuation is mildly advanced for age. This is similar the prior exam. No acute infarct,  hemorrhage, or mass lesion is present. Deep brain nuclei are within normal limits. The ventricles are of normal size. No significant extraaxial fluid collection is present. The brainstem and cerebellum are within normal limits. Midline structures are within normal limits. Vascular: Faint atherosclerotic  calcifications are present within the cavernous internal carotid arteries and at the dural margin of the right vertebral artery. No hyperdense vessel is present. Skull: Calvarium is intact. No focal lytic or blastic lesions are present. No significant extracranial soft tissue lesion is present. Sinuses/Orbits: A fluid level is present left maxillary sinus scratched at a fluid level is present in the left sphenoid sinus. Minimal mucosal thickening is present in the right sphenoid sinus. The paranasal sinuses and mastoid air cells are otherwise clear. The globes and orbits are within normal limits. IMPRESSION: 1. No acute intracranial abnormality or significant interval change. 2. Periventricular and scattered subcortical white matter hypoattenuation is mildly advanced for age. This likely reflects the sequela of chronic microvascular ischemia. 3. Fluid levels in the left maxillary and left sphenoid sinuses. This may reflect acute sinusitis in the appropriate clinical setting. Electronically Signed   By: Audree Leas M.D.   On: 05/17/2023 14:10   DG Chest Portable 1 View Result Date: 05/17/2023 CLINICAL DATA:  Weakness.  Recent episode of COVID. EXAM: PORTABLE CHEST 1 VIEW COMPARISON:  One-view chest x-ray 03/03/2021. FINDINGS: The heart is enlarged. Moderate pulmonary vascular congestion is present bilaterally. Small effusions are suspected. No significant airspace consolidation is present. The visualized soft tissues and bony thorax are unremarkable. IMPRESSION: 1. Cardiomegaly and moderate pulmonary vascular congestion. 2. Small effusions. Electronically Signed   By: Audree Leas M.D.   On: 05/17/2023 14:06   DG Pelvis 1-2 Views Result Date: 05/17/2023 CLINICAL DATA:  Fall.  Dementia. EXAM: PELVIS - 1-2 VIEW COMPARISON:  09/03/2020 FINDINGS: Single AP view of the pelvis demonstrates marked left and moderate right hip joint space narrowing with subchondral sclerosis. Given mild osteopenia, no  acute fracture identified. Degenerative sclerosis of the right sacroiliac joint. Remote trauma or degenerative irregularity of the left symphysis pubis is new since 2022. IMPRESSION: Degenerative change, without acute osseous finding. Electronically Signed   By: Lore Rode M.D.   On: 05/17/2023 14:05       Impression / Plan:    73 y.o. 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 Here with heart failure, anemia, and dark stools. Suspect heart failure is the cause of her shortness of breath as no improvement with pRBC transfusion. Possible covid gastroenteritis. Regardless, she will need cardiac evaluation first.  - recommend cardiology consult - f/u ECHO - diuresis per primary team - ok to advance diet from my perspective - could consider EGD at some point after cardiac work-up but suspect this would be low yield. Could be considered as outpatient along with screening colonoscopy if patient is willing. (She was referred before but refused appointment) - consider nutrition consult due to malnutrition - will check vitamin levels  Dr. Ole Berkeley will pick up care tomorrow. Please let me know if there are any questions or concerns.  Olivia Bevel MD, MPH [facility]

## 2023-05-18 NOTE — Progress Notes (Addendum)
 Progress Note    Elizabeth [name]  ZOX:[id number] DOB: 1950-12-30  DOA: 05/17/2023 PCP: [facility]      Brief Narrative:    Medical records reviewed and are as summarized below:  Elizabeth [name] is a 73 y.o. 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    She presented to the hospital with diarrhea, shortness of breath and general weakness for about 2 weeks duration.  Reportedly, she had some dark stools.  She has also been falling at home.     Assessment/Plan:   Active Problems:   Major depressive disorder, recurrent severe without psychotic features (HCC)   GI bleed   Weakness   Heart failure (HCC)   Hypothyroidism   History of essential hypertension    Body mass index is 18.04 kg/m.   Acute GI bleeding, melena: Continue IV Protonix .  Follow-up with gastroenterologist. Diarrhea may possibly be related to recent COVID-19 infection Recent COVID-19 infection on 04/29/2023.  Acute blood loss anemia, chronic iron deficiency anemia: S/p transfusion 1 unit of PRBCs on 05/17/2023 for hemoglobin of 7.1.  Posttransfusion hemoglobin up to 10. Hemoglobin was 8 on admission. Check iron studies and vitamin B12 level   Suspected acute heart failure: S/p treatment with IV Lasix.  Continue oral Lasix.  He is tolerating  room air.  Monitor BMP, daily weight and urine output BNP 311.6 2D echo is pending   Hypokalemia: Replete potassium and monitor levels   General Weakness: PT and OT evaluation   Comorbidities include bipolar disorder, schizophrenia, anxiety, depression, depression, history of breast cancer s/p mastectomy in February 2023 (on letrozole ).   Plan discussed with [name], son, over the phone.  We discussed CODE STATUS.  He is HPOA and requested to change CODE STATUS from DNR to full code.  CODE STATUS has been updated in the chart.   Diet Order             Diet full liquid Room service appropriate? Yes; Fluid consistency: Thin  Diet effective now                            Consultants: Gastroenterologist  Procedures: None    Medications:    furosemide  40 mg Oral Daily   levothyroxine   88 mcg Oral QAC breakfast   pantoprazole  (PROTONIX ) IV  40 mg Intravenous Q12H   QUEtiapine   25 mg Oral QHS   sertraline   100 mg Oral QHS   sodium chloride  flush  3 mL Intravenous Q12H   Continuous Infusions:  sodium chloride        Anti-infectives (From admission, onward)    None              Family Communication/Anticipated D/C date and plan/Code Status   DVT prophylaxis: Place TED hose Start: 05/17/23 1454     Code Status: Limited: Do not attempt resuscitation (DNR) -DNR-LIMITED -Do Not Intubate/DNI   Family Communication: Plan  discussed with [name], son, over the phone. Disposition Plan: Plan to discharge home   Status is: Observation The patient will require care spanning > 2 midnights and should be moved to inpatient because: GI bleed, CHF, general weakness       Subjective:   Interval events noted.  She complains of general weakness.  Breathing is a little better but she has some heaviness in her chest.  She says she moved her bowels about twice today though no bowel movement has been charted.  She is not sure whether there was blood in the  stool.  No other complaints  Objective:    Vitals:   05/18/23 0451 05/18/23 0500 05/18/23 0816 05/18/23 0842  BP: 131/81  117/71 121/67  Pulse: 88   95  Resp: 15   18  Temp: 97.7 F (36.5 C)   98.2 F (36.8 C)  TempSrc: Oral   Oral  SpO2: 99%   96%  Weight:  41.9 kg    Height:       No data found.   Intake/Output Summary (Last 24 hours) at 05/18/2023 1045 Last data filed at 05/18/2023 0904 Gross per 24 hour  Intake 940 ml  Output 800 ml  Net 140 ml   Filed Weights   05/17/23 1235 05/18/23 0500  Weight: 45.8 kg 41.9 kg    Exam:  GEN: NAD SKIN: Warm and dry EYES: No pallor or icterus ENT: MMM CV: RRR PULM: CTA B ABD: soft, ND, NT, +BS CNS: AAO x 3, non focal EXT: No edema or tenderness        Data Reviewed:   I have personally reviewed following labs and imaging studies:  Labs: Labs show the following:   Basic Metabolic Panel: Recent Labs  Lab 05/17/23 1335 05/18/23 0319 05/18/23 0415  NA 141 141  --   K 3.4* 3.0*  --   CL 109 106  --   CO2 19* 24  --   GLUCOSE 124* 89  --   BUN 32* 25*  --   CREATININE 0.93 0.79  --   CALCIUM  7.9* 7.7*  --   MG  --  2.0  --   PHOS  --   --  2.9   GFR Estimated Creatinine Clearance: 42 mL/min (by C-G formula based on SCr of 0.79 mg/dL). Liver Function Tests: Recent Labs  Lab 05/17/23 1335 05/18/23 0319  AST 91* 84*  ALT 27 24  ALKPHOS 281* 258*  BILITOT 0.6 0.8  PROT 6.4* 6.2*  ALBUMIN  2.2* 2.0*   Recent Labs  Lab 05/17/23 1335  LIPASE 24   No results for input(s): "AMMONIA" in the last 168 hours. Coagulation profile No results for input(s): "INR", "PROTIME" in the last 168 hours.  CBC: Recent Labs  Lab 05/17/23 1335 05/17/23 1639 05/17/23 2251 05/18/23 0319 05/18/23 0908  WBC 7.6  --   --   --   --   HGB 8.0* 7.1* 8.6* 8.7* 10.0*  HCT 25.3* 21.8* 25.2* 25.8* 29.2*  MCV 88.2  --   --   --   --   PLT 411*  --   --   --   --    Cardiac Enzymes: No results for input(s):  "CKTOTAL", "CKMB", "CKMBINDEX", "TROPONINI" in the last 168 hours. BNP (last 3 results) No results for input(s): "PROBNP" in the last 8760 hours. CBG: No results for input(s): "GLUCAP" in the last 168 hours. D-Dimer: No results for input(s): "DDIMER" in the last 72 hours.  Hgb A1c: No results for input(s): "HGBA1C" in the last 72 hours. Lipid Profile: No results for input(s): "CHOL", "HDL", "LDLCALC", "TRIG", "CHOLHDL", "LDLDIRECT" in the last 72 hours. Thyroid function studies: Recent Labs    05/17/23 1639  TSH 5.246*   Anemia work up: No results for input(s): "VITAMINB12", "FOLATE", "FERRITIN", "TIBC", "IRON", "RETICCTPCT" in the last 72 hours. Sepsis Labs: Recent Labs  Lab 05/17/23 1335  WBC 7.6    Microbiology No results found for this or any previous visit (from the past 240 hours).  Procedures and diagnostic studies:  CT HEAD WO CONTRAST ( ) Result Date: 05/17/2023 CLINICAL DATA:  Head trauma. Generalized weakness. Recent unwitnessed fall. EXAM: CT HEAD WITHOUT CONTRAST TECHNIQUE: Contiguous axial images were obtained from the base of the skull through the vertex without intravenous contrast. RADIATION DOSE REDUCTION: This exam was performed according to the departmental dose-optimization program which includes automated exposure control, adjustment of the mA and/or kV according to patient size and/or use of iterative reconstruction technique. COMPARISON:  CT head without contrast 08/16/2022. FINDINGS: Brain: Periventricular and scattered subcortical white matter hypoattenuation is mildly advanced for age. This is similar the prior exam. No acute infarct, hemorrhage, or mass lesion is present. Deep brain nuclei are within normal limits. The ventricles are of normal size. No significant extraaxial fluid collection is present. The brainstem and cerebellum are within normal limits. Midline structures are within normal limits. Vascular: Faint atherosclerotic calcifications are  present within the cavernous internal carotid arteries and at the dural margin of the right vertebral artery. No hyperdense vessel is present. Skull: Calvarium is intact. No focal lytic or blastic lesions are present. No significant extracranial soft tissue lesion is present. Sinuses/Orbits: A fluid level is present left maxillary sinus scratched at a fluid level is present in the left sphenoid sinus. Minimal mucosal thickening is present in the right sphenoid sinus. The paranasal sinuses and mastoid air cells are otherwise clear. The globes and orbits are within normal limits. IMPRESSION: 1. No acute intracranial abnormality or significant interval change. 2. Periventricular and scattered subcortical white matter hypoattenuation is mildly advanced for age. This likely reflects the sequela of chronic microvascular ischemia. 3. Fluid levels in the left maxillary and left sphenoid sinuses. This may reflect acute sinusitis in the appropriate clinical setting. Electronically Signed   By: Audree Leas M.D.   On: 05/17/2023 14:10   DG Chest Portable 1 View Result Date: 05/17/2023 CLINICAL DATA:  Weakness.  Recent episode of COVID. EXAM: PORTABLE CHEST 1 VIEW COMPARISON:  One-view chest x-ray 03/03/2021. FINDINGS: The heart is enlarged. Moderate pulmonary vascular congestion is present bilaterally. Small effusions are suspected. No significant airspace consolidation is present. The visualized soft tissues and bony thorax are unremarkable. IMPRESSION: 1. Cardiomegaly and moderate pulmonary vascular congestion. 2. Small effusions. Electronically Signed   By: Audree Leas M.D.   On: 05/17/2023 14:06   DG Pelvis 1-2 Views Result Date: 05/17/2023 CLINICAL DATA:  Fall.  Dementia. EXAM: PELVIS - 1-2 VIEW COMPARISON:  09/03/2020 FINDINGS: Single AP view of the pelvis demonstrates marked left and moderate right hip joint space narrowing with subchondral sclerosis. Given mild osteopenia, no acute fracture  identified. Degenerative sclerosis of the right sacroiliac joint. Remote trauma or degenerative irregularity of the left symphysis pubis is new since 2022. IMPRESSION: Degenerative change, without acute osseous finding. Electronically Signed   By: Lore Rode M.D.   On: 05/17/2023 14:05               LOS:  0 days   Richar Dunklee  [facility]   Pager on [email or web address]. If 7PM-7AM, please contact night-coverage at [email or web address]     05/18/2023, 10:45 AM

## 2023-05-18 NOTE — Progress Notes (Signed)
 Occupational Therapy Evaluation Patient Details Name: Elizabeth [name] MRN: [id number] DOB: [birth year] Today's Date: 05/18/2023   History of Present Illness   Elizabeth [name] is a 73 y.o. female with medical history significant of hypothyroidism, GI bleeding, bipolar, schizophrenia, panic disorder, depression with anxiety, anemia, fibromyalgia, breast cancer (s/p for right mastectomy), right lymphedema presenting with weakness, symptomatic anemia, upper GI bleed, heart failure.  Patient reports worsening shortness of breath over multiple weeks.  Was recent diagnosed with COVID roughly 2 weeks ago.  Upper respiratory symptoms have resolved though weakness has persisted.  Positive orthopnea PND.  Minimal chest pain.  No abdominal pain.  No nausea.?  Intermittent loose stools.  Positive falls at home.     Clinical Impressions Pt was seen for OT evaluation this date. Prior to hospital admission, pt reports she is mod-I with her rollator and requires assist from health care aides that come daily 9-4 pm. Pt reports significant difficulty entering/exiting her home because she has 13 STE and exit. Pt endorses she feel like mobility wise she is close to her baseline. Pt presents to acute OT demonstrating impaired ADL performance and functional mobility 2/2  (See OT problem list for additional functional deficits). Pt currently requires MAXA don bilateral socks while seated in recliner, she states that her aid assist with all her dressing needs. Pt completed step pivot transfer to [facility] with MINA, requiring physical assistance to guide her trunk due to anxious behavior to hurry to the toilet. On arrival to room pt was attempted to get out of recliner on her own. Pt seemly very anxious throughout session, eager to return to recliner to rest. Pt would benefit from skilled OT services to address noted impairments and functional limitations (see below for any additional details) in order to maximize safety and  independence while minimizing falls risk and caregiver burden. OT will follow acutely.       If plan is discharge home, recommend the following:   A lot of help with walking and/or transfers;A lot of help with bathing/dressing/bathroom;Assistance with cooking/housework;Assist for transportation     Functional Status Assessment   Patient has had a recent decline in their functional status and demonstrates the ability to make significant improvements in function in a reasonable and predictable amount of time.     Equipment Recommendations   Other (comment) (Defer to next venue of care)     Recommendations for Other Services         Precautions/Restrictions   Precautions Precautions: Fall     Mobility Bed Mobility               General bed mobility comments: NT recliner pre/post session    Transfers Overall transfer level: Needs assistance Equipment used: Rolling walker (2 wheels) Transfers: Sit to/from Stand, Bed to chair/wheelchair/BSC Sit to Stand: Contact guard assist     Step pivot transfers: Min assist     General transfer comment: Pt very anxious on arrival to room to get to [facility], required heavy cues for pivot to [facility]      Balance Overall balance assessment: Needs assistance Sitting-balance support: Bilateral upper extremity supported, Feet supported Sitting balance-Leahy Scale: Fair     Standing balance support: Bilateral upper extremity supported, During functional activity Standing balance-Leahy Scale: Fair                             ADL either performed or assessed with clinical judgement  ADL Overall ADL's : Needs assistance/impaired                     Lower Body Dressing: Maximal assistance   Toilet Transfer: Minimal assistance;BSC/3in1;Stand-pivot;Rolling walker (2 wheels)   Toileting- Clothing Manipulation and Hygiene: Contact guard assist;Sit to/from stand       Functional mobility during ADLs:  Minimal assistance;Rolling walker (2 wheels);Cueing for sequencing General ADL Comments: MAXA don bilateral socks, MINA stand pivot t/f onto BSC, CGA pericare     Vision         Perception         Praxis         Pertinent Vitals/Pain Pain Assessment Pain Assessment: 0-10 Pain Score: 8  Pain Location: Stomach Pain Descriptors / Indicators: Aching Pain Intervention(s): Limited activity within patient's tolerance, Monitored during session     Extremity/Trunk Assessment Upper Extremity Assessment Upper Extremity Assessment: Generalized weakness   Lower Extremity Assessment Lower Extremity Assessment: Overall WFL for tasks assessed;Defer to PT evaluation   Cervical / Trunk Assessment Cervical / Trunk Assessment: Normal   Communication Communication Communication: No apparent difficulties   Cognition Arousal: Alert Behavior During Therapy: Anxious Cognition: Cognition impaired   Orientation impairments: Situation       Executive functioning impairment (select all impairments): Problem solving, Reasoning OT - Cognition Comments: A/Ox3 unable to correctly state situation                 Following commands: Impaired Following commands impaired: Follows multi-step commands inconsistently     Cueing  General Comments   Cueing Techniques: Verbal cues;Tactile cues  Amb on her toes, does not endorse any pain   Exercises Exercises: Other exercises Other Exercises Other Exercises: Edu: Role of OT, safel ADL completion, DME management   Shoulder Instructions      Home Living Family/patient expects to be discharged to:: Private residence Living Arrangements: Alone Available Help at Discharge: Personal care attendant;Other (Comment) Type of Home: House Home Access: Stairs to enter Entrance Stairs-Number of Steps: 13 Entrance Stairs-Rails: Right;Left Home Layout: Two level;Able to live on main level with bedroom/bathroom     Bathroom Shower/Tub: Retail banker: Standard Bathroom Accessibility: Yes How Accessible: Accessible via walker Home Equipment: Rollator (4 wheels)          Prior Functioning/Environment Prior Level of Function : Needs assist  Cognitive Assist : Mobility (cognitive);ADLs (cognitive)     Physical Assist : Mobility (physical);ADLs (physical)     Mobility Comments: reports now using rollator for all mobility. Reports extreme difficulty with steps over the last month. ADLs Comments: pt reports home aide also helps with bathing    OT Problem List: Decreased strength;Decreased activity tolerance;Impaired balance (sitting and/or standing);Impaired vision/perception;Decreased coordination;Decreased knowledge of use of DME or AE;Decreased safety awareness   OT Treatment/Interventions: Self-care/ADL training;Neuromuscular education;Therapeutic exercise;DME and/or AE instruction;Energy conservation;Therapeutic activities;Cognitive remediation/compensation;Patient/family education;Balance training      OT Goals(Current goals can be found in the care plan section)   Acute Rehab OT Goals Patient Stated Goal: feel better OT Goal Formulation: With patient Time For Goal Achievement: 06/01/23 Potential to Achieve Goals: Good ADL Goals Pt Will Perform Grooming: with supervision Pt Will Perform Lower Body Dressing: with mod assist Pt Will Transfer to Toilet: ambulating;with contact guard assist Pt Will Perform Toileting - Clothing Manipulation and hygiene: with modified independence;sitting/lateral leans   OT Frequency:  Min 1X/week    Co-evaluation  AM-PAC OT "6 Clicks" Daily Activity     Outcome Measure Help from another person eating meals?: None Help from another person taking care of personal grooming?: A Lot Help from another person toileting, which includes using toliet, bedpan, or urinal?: A Little Help from another person bathing (including washing, rinsing, drying)?: A  Lot Help from another person to put on and taking off regular upper body clothing?: A Little Help from another person to put on and taking off regular lower body clothing?: A Lot 6 Click Score: 16   End of Session Equipment Utilized During Treatment: Rolling walker (2 wheels);Gait belt Nurse Communication: Mobility status;Other (comment) (Need for chair alarm/pt impulsivity)  Activity Tolerance: Patient tolerated treatment well Patient left: in chair;with call bell/phone within reach;with nursing/sitter in room;with chair alarm set  OT Visit Diagnosis: Unsteadiness on feet (R26.81);Other abnormalities of gait and mobility (R26.89);Muscle weakness (generalized) (M62.81);Repeated falls (R29.6)                Time: 1610-9604 OT Time Calculation (min): 17 min Charges:  OT General Charges $OT Visit: 1 Visit OT Evaluation $OT Eval High Complexity: 1 High OT Treatments $Self Care/Home Management : 8-22 mins  Rosaria Common M.S. OTR/L  05/18/23, 4:25 PM

## 2023-05-18 NOTE — Plan of Care (Signed)
  Problem: Education: Goal: Knowledge of General Education information will improve Description: Including pain rating scale, medication(s)/side effects and non-pharmacologic comfort measures Outcome: Progressing   Problem: Clinical Measurements: Goal: Cardiovascular complication will be avoided Outcome: Progressing   Problem: Coping: Goal: Level of anxiety will decrease Outcome: Progressing   Problem: Elimination: Goal: Will not experience complications related to urinary retention Outcome: Progressing   Problem: Activity: Goal: Capacity to carry out activities will improve Outcome: Not Progressing   Problem: Cardiac: Goal: Ability to achieve and maintain adequate cardiopulmonary perfusion will improve Outcome: Progressing

## 2023-05-19 ENCOUNTER — Inpatient Hospital Stay: Admitting: Oncology

## 2023-05-19 ENCOUNTER — Ambulatory Visit: Admitting: Radiation Oncology

## 2023-05-19 ENCOUNTER — Telehealth: Payer: Self-pay | Admitting: Oncology

## 2023-05-19 DIAGNOSIS — D649 Anemia, unspecified: Secondary | ICD-10-CM | POA: Diagnosis not present

## 2023-05-19 DIAGNOSIS — Z01810 Encounter for preprocedural cardiovascular examination: Secondary | ICD-10-CM | POA: Diagnosis not present

## 2023-05-19 DIAGNOSIS — I5031 Acute diastolic (congestive) heart failure: Secondary | ICD-10-CM | POA: Diagnosis not present

## 2023-05-19 DIAGNOSIS — K922 Gastrointestinal hemorrhage, unspecified: Secondary | ICD-10-CM | POA: Diagnosis not present

## 2023-05-19 LAB — CBC
HCT: 28.4 % — ABNORMAL LOW (ref 36.0–46.0)
Hemoglobin: 9.6 g/dL — ABNORMAL LOW (ref 12.0–15.0)
MCH: 28.5 pg (ref 26.0–34.0)
MCHC: 33.8 g/dL (ref 30.0–36.0)
MCV: 84.3 fL (ref 80.0–100.0)
Platelets: 391 10*3/uL (ref 150–400)
RBC: 3.37 MIL/uL — ABNORMAL LOW (ref 3.87–5.11)
RDW: 20 % — ABNORMAL HIGH (ref 11.5–15.5)
WBC: 6.3 10*3/uL (ref 4.0–10.5)
nRBC: 0.5 % — ABNORMAL HIGH (ref 0.0–0.2)

## 2023-05-19 LAB — RENAL FUNCTION PANEL
Albumin: 2 g/dL — ABNORMAL LOW (ref 3.5–5.0)
Anion gap: 12 (ref 5–15)
BUN: 24 mg/dL — ABNORMAL HIGH (ref 8–23)
CO2: 26 mmol/L (ref 22–32)
Calcium: 8 mg/dL — ABNORMAL LOW (ref 8.9–10.3)
Chloride: 102 mmol/L (ref 98–111)
Creatinine, Ser: 1.05 mg/dL — ABNORMAL HIGH (ref 0.44–1.00)
GFR, Estimated: 56 mL/min — ABNORMAL LOW (ref >60)
Glucose, Bld: 90 mg/dL (ref 70–99)
Phosphorus: 3.3 mg/dL (ref 2.5–4.6)
Potassium: 3.9 mmol/L (ref 3.5–5.1)
Sodium: 140 mmol/L (ref 135–145)

## 2023-05-19 LAB — ECHOCARDIOGRAM COMPLETE
AR max vel: 2.73 cm2
AV Peak grad: 10.2 mmHg
Ao pk vel: 1.6 m/s
Area-P 1/2: 4.71 cm2
Height: 60 in
P 1/2 time: 479 ms
S' Lateral: 2.9 cm
Weight: 1477.96 [oz_av]

## 2023-05-19 LAB — C DIFFICILE QUICK SCREEN W PCR REFLEX
C Diff antigen: NEGATIVE
C Diff interpretation: NOT DETECTED
C Diff toxin: NEGATIVE

## 2023-05-19 LAB — VITAMIN D 25 HYDROXY (VIT D DEFICIENCY, FRACTURES): Vit D, 25-Hydroxy: 101.94 ng/mL — ABNORMAL HIGH (ref 30–100)

## 2023-05-19 LAB — VITAMIN B12: Vitamin B-12: 625 pg/mL (ref 180–914)

## 2023-05-19 MED ORDER — PEG 3350-KCL-NA BICARB-NACL 420 G PO SOLR
4000.0000 mL | Freq: Once | ORAL | Status: AC
  Administered 2023-05-19: 4000 mL via ORAL
  Filled 2023-05-19: qty 4000

## 2023-05-19 MED ORDER — PANTOPRAZOLE SODIUM 40 MG PO TBEC
40.0000 mg | DELAYED_RELEASE_TABLET | Freq: Two times a day (BID) | ORAL | Status: DC
  Administered 2023-05-19 – 2023-05-30 (×22): 40 mg via ORAL
  Filled 2023-05-19 (×22): qty 1

## 2023-05-19 MED ORDER — POTASSIUM CHLORIDE CRYS ER 20 MEQ PO TBCR
20.0000 meq | EXTENDED_RELEASE_TABLET | Freq: Every day | ORAL | Status: AC
  Administered 2023-05-19 – 2023-05-20 (×2): 20 meq via ORAL
  Filled 2023-05-19 (×2): qty 1

## 2023-05-19 NOTE — Progress Notes (Signed)
 Pt states numbness has subsided, feeling has returned to her r hip/thigh area. No other complaints.

## 2023-05-19 NOTE — TOC Initial Note (Signed)
 Transition of Care [facility]) - Initial/Assessment Note    Patient Details  Name: Elizabeth [name] MRN: [id number] Date of Birth: 1950/06/23  Transition of Care [facility]) CM/SW Contact:    Elsie Halo, RN Phone Number: 05/19/2023, 4:16 PM  Clinical Narrative:                  Patient is from home with caregivers during the day. She has DME including a Rollator, RW, shower chair, and raised commode. She is active with a PCP and doesn't have difficulty getting to appointments or obtaining medications.  TOC explained to the patient that therapy is recommending that she go to a SNF for STR. The patient advised that she has caregivers and wishes to return to home at discharge.  TOC will continue to follow. Expected Discharge Plan: Home w Home Health Services     Patient Goals and CMS Choice            Expected Discharge Plan and Services   Discharge Planning Services: CM Consult   Living arrangements for the past 2 months: Single Family Home                                      Prior Living Arrangements/Services Living arrangements for the past 2 months: Single Family Home Lives with:: Other (Comment) (Has caregivers during the day)   Do you feel safe going back to the place where you live?: Yes          Current home services: DME (walker, shower chair, commode seat)    Activities of Daily Living   ADL Screening (condition at time of admission) Independently performs ADLs?: No Does the patient have a NEW difficulty with bathing/dressing/toileting/self-feeding that is expected to last >3 days?: No Does the patient have a NEW difficulty with getting in/out of bed, walking, or climbing stairs that is expected to last >3 days?: No Does the patient have a NEW difficulty with communication that is expected to last >3 days?: No Is the patient deaf or have difficulty hearing?: No Does the patient have difficulty seeing, even when wearing glasses/contacts?: No Does  the patient have difficulty concentrating, remembering, or making decisions?: No  Permission Sought/Granted                  Emotional Assessment           Psych Involvement: No (comment)  Admission diagnosis:  Weakness [R53.1] GI bleed [K92.2] Symptomatic anemia [D64.9] Gastrointestinal hemorrhage, unspecified gastrointestinal hemorrhage type [K92.2] Patient Active Problem List   Diagnosis Date Noted   Symptomatic anemia 05/19/2023   GI bleed 05/17/2023   Weakness 05/17/2023   Heart failure (HCC) 05/17/2023   Orthostatic hypotension 08/18/2022   Syncope 08/16/2022   Fall at home, initial encounter 08/16/2022   Iron deficiency anemia 08/16/2022   Pubic ramus fracture, left, sequela 08/16/2022   Left hip pain 08/16/2022   Diarrhea 08/16/2022   Lymphedema of right arm 08/16/2022   Primary osteoarthritis of left hip 08/16/2022   Contusion of left hip 08/16/2022   Schizophrenia (HCC)    Hypothyroidism    Anxiety and depression    Bipolar 1 disorder (HCC)    Major depressive disorder, recurrent severe without psychotic features (HCC) 04/21/2021   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HCC) 01/28/2021   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 [place]) [place] [address] Phone: [phone] Fax: [phone]  [facility] [address] [place] [address] Phone: [phone] Fax: [phone]     Social Drivers of Health (SDOH) Social History: SDOH Screenings   Food Insecurity: No Food Insecurity (05/17/2023)  Housing: Low Risk   (05/17/2023)  Transportation Needs: No Transportation Needs (05/17/2023)  Utilities: Not At Risk (05/17/2023)  Depression (PHQ2-9): High Risk (07/22/2022)  Financial Resource Strain: Low Risk  (08/20/2022)   Received from [facility]  Physical Activity: Insufficiently Active (08/06/2021)   Received from [facility], [facility]  Social Connections: Socially Isolated (05/17/2023)  Stress: No Stress Concern Present (08/27/2021)   Received from [facility]  Tobacco Use: Medium Risk (05/17/2023)   SDOH Interventions:     Readmission Risk Interventions     No data to display

## 2023-05-19 NOTE — Plan of Care (Signed)

## 2023-05-19 NOTE — Telephone Encounter (Signed)
 Patients son called to inform us  that she is in the hospital and will not make appointment today. He request to reschedule when she is home. Appointment cancelled as requested

## 2023-05-19 NOTE — Progress Notes (Signed)
 Pt c/o localized numbness r hip/thigh area. V/s obtained, T98.3, bp 119/66 P 93 R 18. Perrla, strong hand grips, able to move all extremities, alert and oriented x'4 no slurred speech. Brion Cancel MD made aware. Will continue to monitor.

## 2023-05-19 NOTE — Care Management Obs Status (Signed)
 MEDICARE OBSERVATION STATUS NOTIFICATION   Patient Details  Name: Elizabeth [name] MRN: [id number] Date of Birth: 25-Nov-1950   Medicare Observation Status Notification Given:  Rudolph Cost, CMA 05/19/2023, 10:48 AM

## 2023-05-19 NOTE — Consult Note (Signed)
 Cardiology Consultation   Patient ID: ALMETIA NOP MRN: [id number]; DOB: Jul 07, 1950  Admit date: 05/17/2023 Date of Consult: 05/19/2023  PCP:  [facility]   [facility] Providers Cardiologist:  None   {    Patient Profile:   Elizabeth [name] is a 73 y.o. female with a hx of mild dementia, chronic anemia, bipolar disorder and previous history of breast cancer who is being seen 05/19/2023 for the evaluation of shortness of breath and possible heart failure at the request of Dr. Leory Rands.  History of Present Illness:   Elizabeth [name] is a 73 year old female with no prior cardiac history.  She had COVID-19 infection about 2-1/2 weeks ago and since then she noticed worsening exertional dyspnea, fatigue and occasional episodes of chest pain that can happen both at rest and with exertion.  She became very deconditioned with difficulty ambulating and thus she was brought for evaluation by her son.  She was found to be anemic with a hemoglobin of 7.1 with guaiac positive stool.  Her BNP was mildly elevated at 300 and chest x-ray showed pulmonary vascular congestion with small pleural effusion.  She was transfused with packed RBCs and was diuresed with IV furosemide for 1 day and then switch to oral furosemide 40 mg once daily.  She was seen by gastroenterology who requested preop cardiovascular evaluation given suspected heart failure. The patient currently feels better with less shortness of breath.   Past Medical History:  Diagnosis Date   Acute cholecystitis 2016   Anemia    Anemia 2016   Anxiety    panic attacks   Anxiety and depression    Arthritis    Bipolar 1 disorder (HCC)    Bowel obstruction (HCC) 2017   Common bile duct dilatation 2015   Dementia (HCC)    Dyspnea    Edema    Fibromyalgia    GI bleed 2017   History of kidney stones 01/21/2009   HSV-1 infection    Hyperglycemia    Hyperlipidemia    Hypokalemia    Hypothyroidism    Liver  cyst    Malignant neoplasm of right female breast, unspecified estrogen receptor status, unspecified site of breast (HCC)    Memory loss    Osteopenia    Panic disorder    Schizophrenia (HCC)    Ventral hernia     Past Surgical History:  Procedure Laterality Date   ABDOMINAL HYSTERECTOMY     APPENDECTOMY     BREAST BIOPSY Right 01/19/2021   US  Bx, ribbon, path pending   BREAST BIOPSY Right 01/19/2021   US  Bx, Venus, path pending   BREAST BIOPSY Right 01/19/2021   US  Bx, heart, path pending   BREAST BIOPSY Right 01/19/2021   US  BX Axilla, Coil, path pending   CESAREAN SECTION     CHOLECYSTECTOMY OPEN  11/17/2013   with repair of bile duct-Dr. Adele Admire   MASTECTOMY MODIFIED RADICAL Right 03/02/2021   Procedure: MASTECTOMY MODIFIED RADICAL;  Surgeon: Conrado Delay, DO;  Location: [facility];  Service: General;  Laterality: Right;   OPEN REDUCTION INTERNAL FIXATION (ORIF) DISTAL RADIAL FRACTURE Right 12/20/2014   Procedure: OPEN REDUCTION INTERNAL FIXATION (ORIF) RIGHT DISTAL RADIAL FRACTURE;  Surgeon: Saundra Curl, MD;  Location: [facility];  Service: Orthopedics;  Laterality: Right;   STOMACH SURGERY       Home Medications:  Prior to Admission medications   Medication Sig Start Date End Date Taking? Authorizing Provider  acetaminophen  (  TYLENOL ) 325 MG tablet Take 2 tablets (650 mg total) by mouth every 6 (six) hours as needed for mild pain, fever or headache. 08/19/22   Montey Apa, DO  alendronate  (FOSAMAX ) 70 MG tablet Take 1 tablet by mouth every Monday. 03/09/20   [provider]  ferrous sulfate  325 (65 FE) MG tablet Take 1 tablet by mouth daily with breakfast. 08/15/21   [provider]  letrozole  (FEMARA ) 2.5 MG tablet Take 1 tablet (2.5 mg total) by mouth daily. 11/28/22   Shellie Dials, MD  levothyroxine  (SYNTHROID , LEVOTHROID) 88 MCG tablet Take 88 mcg by mouth daily before breakfast.    [provider]  lidocaine  (LMX) 4 %  cream Apply 1 Application topically as needed. 08/19/22   Darus Engels A, DO  midodrine  (PROAMATINE ) 10 MG tablet Take 1 tablet (10 mg total) by mouth 3 (three) times daily with meals. 08/19/22   Montey Apa, DO  midodrine  (PROAMATINE ) 10 MG tablet Take by mouth 3 (three) times daily.    [provider]  QUEtiapine  (SEROQUEL ) 25 MG tablet Take 1 tablet (25 mg total) by mouth at bedtime. 05/15/23 08/13/23  Todd Fossa, MD  sertraline  (ZOLOFT ) 100 MG tablet Take 1 tablet (100 mg total) by mouth at bedtime. 06/08/23 09/06/23  Todd Fossa, MD  Vitamin D , Ergocalciferol , (DRISDOL ) 1.25 MG (50000 UNIT) CAPS capsule Take 50,000 Units by mouth once a week. 07/02/21   [provider]    Inpatient Medications: Scheduled Meds:  furosemide  40 mg Oral Daily   levothyroxine   88 mcg Oral QAC breakfast   pantoprazole   40 mg Oral BID   QUEtiapine   25 mg Oral QHS   sertraline   100 mg Oral QHS   sodium chloride  flush  3 mL Intravenous Q12H   Continuous Infusions:  PRN Meds: acetaminophen , ondansetron  **OR** ondansetron  (ZOFRAN ) IV, sodium chloride  flush  Allergies:    Allergies  Allergen Reactions   Penicillins Hives    TOLERATED CEFAZOLIN     Social History:   Social History   Socioeconomic History   Marital status: Married    Spouse name: Not on file   Number of children: 1   Years of education: Not on file   Highest education level: Not on file  Occupational History   Not on file  Tobacco Use   Smoking status: Former    Current packs/day: 0.00    Types: Cigarettes    Quit date: 11/03/1973    Years since quitting: 49.5    Passive exposure: Past   Smokeless tobacco: Never   Tobacco comments:    Smoked briefly as a teen- lives in secondhand smoke.   Vaping Use   Vaping status: Never Used  Substance and Sexual Activity   Alcohol use: No    Alcohol/week: 0.0 standard drinks of alcohol   Drug use: No   Sexual activity: Not Currently  Other Topics Concern    Not on file  Social History Narrative   Not on file   Social Drivers of Health   Financial Resource Strain: Low Risk  (08/20/2022)   Received from [facility]   Overall Financial Resource Strain (CARDIA)    Difficulty of Paying Living Expenses: Not hard at all  Food Insecurity: No Food Insecurity (05/17/2023)   Hunger Vital Sign    Worried About Running Out of Food in the Last Year: Never true    Ran Out of Food in the Last Year: Never true  Transportation Needs:  No Transportation Needs (05/17/2023)   PRAPARE - Administrator, Civil Service (Medical): No    Lack of Transportation (Non-Medical): No  Physical Activity: Insufficiently Active (08/06/2021)   Received from [facility], [facility]   Exercise Vital Sign    Days of Exercise per Week: 2 days    Minutes of Exercise per Session: 30 min  Stress: No Stress Concern Present (08/27/2021)   Received from [facility], [facility]   [employer] of Occupational Health - Occupational Stress Questionnaire    Feeling of Stress : Only a little  Social Connections: Socially Isolated (05/17/2023)   Social Connection and Isolation Panel [NHANES]    Frequency of Communication with Friends and Family: More than three times a week    Frequency of Social Gatherings with Friends and Family: Twice a week    Attends Religious Services: Never    Database administrator or Organizations: No    Attends Banker Meetings: Never    Marital Status: Widowed  Intimate Partner Violence: Not At Risk (05/17/2023)   Humiliation, Afraid, Rape, and Kick questionnaire    Fear of Current or Ex-Partner: No    Emotionally Abused: No    Physically Abused: No    Sexually Abused: No    Family History:    Family History  Problem Relation Age of Onset   Heart disease Father    Breast cancer Paternal Aunt    Kidney disease Paternal Aunt    Breast  cancer Paternal Grandmother    Asthma Daughter    Bladder Cancer Neg Hx      ROS:  Please see the history of present illness.   All other ROS reviewed and negative.     Physical Exam/Data:   Vitals:   05/18/23 2004 05/19/23 0410 05/19/23 0500 05/19/23 0802  BP: 116/74 120/64  117/68  Pulse: 96 94  97  Resp:    18  Temp: 98.1 F (36.7 C) 98.7 F (37.1 C)    TempSrc:      SpO2: 95% 95%  97%  Weight:   42.3 kg   Height:        Intake/Output Summary (Last 24 hours) at 05/19/2023 1259 Last data filed at 05/19/2023 0942 Gross per 24 hour  Intake 0 ml  Output 500 ml  Net -500 ml      05/19/2023    5:00 AM 05/18/2023    5:00 AM 05/17/2023   12:35 PM  Last 3 Weights  Weight (lbs) 93 lb 4.1 oz 92 lb 6 oz 101 lb  Weight (kg) 42.3 kg 41.9 kg 45.813 kg     Body mass index is 18.21 kg/m.  General:  Well nourished, well developed, in no acute distress HEENT: normal Neck: no JVD Vascular: No carotid bruits; Distal pulses 2+ bilaterally Cardiac:  normal S1, S2; RRR; 1 out of 6 systolic murmur in the aortic area. Lungs:  clear to auscultation bilaterally, no wheezing, rhonchi or rales  Abd: soft, nontender, no hepatomegaly  Ext: no edema Musculoskeletal:  No deformities, BUE and BLE strength normal and equal Skin: warm and dry  Neuro:  CNs 2-12 intact, no focal abnormalities noted Psych:  Normal affect   EKG: No EKG done during this admission.  I requested an EKG.  Relevant CV Studies: An echocardiogram was done yesterday and was personally reviewed by me.  It showed normal LV systolic function, grade 1 diastolic dysfunction, normal  pulmonary pressure, mild pulmonary hypertension and normal-sized IVC.  Laboratory Data:  High Sensitivity Troponin:   Recent Labs  Lab 05/17/23 1335  TROPONINIHS 21*     Chemistry Recent Labs  Lab 05/17/23 1335 05/18/23 0319 05/19/23 0436  NA 141 141 140  K 3.4* 3.0* 3.9  CL 109 106 102  CO2 19* 24 26  GLUCOSE 124* 89 90  BUN 32*  25* 24*  CREATININE 0.93 0.79 1.05*  CALCIUM  7.9* 7.7* 8.0*  MG  --  2.0  --   GFRNONAA >60 >60 56*  ANIONGAP 13 11 12     Recent Labs  Lab 05/17/23 1335 05/18/23 0319 05/19/23 0436  PROT 6.4* 6.2*  --   ALBUMIN  2.2* 2.0* 2.0*  AST 91* 84*  --   ALT 27 24  --   ALKPHOS 281* 258*  --   BILITOT 0.6 0.8  --    Lipids No results for input(s): "CHOL", "TRIG", "HDL", "LABVLDL", "LDLCALC", "CHOLHDL" in the last 168 hours.  Hematology Recent Labs  Lab 05/17/23 1335 05/17/23 1639 05/18/23 0319 05/18/23 0908 05/19/23 0435  WBC 7.6  --   --   --  6.3  RBC 2.87*  --   --   --  3.37*  HGB 8.0*   < > 8.7* 10.0* 9.6*  HCT 25.3*   < > 25.8* 29.2* 28.4*  MCV 88.2  --   --   --  84.3  MCH 27.9  --   --   --  28.5  MCHC 31.6  --   --   --  33.8  RDW 21.3*  --   --   --  20.0*  PLT 411*  --   --   --  391   < > = values in this interval not displayed.   Thyroid  Recent Labs  Lab 05/17/23 1639  TSH 5.246*    BNP Recent Labs  Lab 05/17/23 1639  BNP 311.6*    DDimer No results for input(s): "DDIMER" in the last 168 hours.   Radiology/Studies:  ECHOCARDIOGRAM COMPLETE Result Date: 05/19/2023    ECHOCARDIOGRAM REPORT   Patient Name:   JOYCE PENISTON [facility] Date of Exam: 05/18/2023 Medical Rec #:  [id number]           Height:       60.0 in Accession #:    [id number]          Weight:       92.4 lb Date of Birth:  [birth year]          BSA:          1.344 m Patient Age:    72 years            BP:           0/0 mmHg Patient Gender: F                   HR:           90 bpm. Exam Location:  [facility] Procedure: 2D Echo, Cardiac Doppler, Color Doppler and Strain Analysis (Both            Spectral and Color Flow Doppler were utilized during procedure). Indications:     CHF- Acute Diastolic I50.31  History:         Patient has no prior history of Echocardiogram examinations.  Sonographer:     Jane Meager RDCS Referring Phys:  [id number] Alayne Allis NEWTON Diagnosing Phys: Antionette Kirks MD  Sonographer Comments:  Image acquisition challenging due to mastectomy and Image acquisition challenging due to respiratory motion. IMPRESSIONS  1. Left ventricular ejection fraction, by estimation, is 55 to 60%. The left ventricle has normal function. The left ventricle has no regional wall motion abnormalities. Left ventricular diastolic parameters are consistent with Grade I diastolic dysfunction (impaired relaxation). The average left ventricular global longitudinal strain is -17.4 %. The global longitudinal strain is normal.  2. Right ventricular systolic function is normal. The right ventricular size is normal. There is normal pulmonary artery systolic pressure. The estimated right ventricular systolic pressure is 23.4 mmHg.  3. The mitral valve is normal in structure. Mild mitral valve regurgitation. No evidence of mitral stenosis.  4. The aortic valve is normal in structure. Aortic valve regurgitation is mild. Aortic valve sclerosis is present, with no evidence of aortic valve stenosis.  5. The inferior vena cava is normal in size with greater than 50% respiratory variability, suggesting right atrial pressure of 3 mmHg. FINDINGS  Left Ventricle: Left ventricular ejection fraction, by estimation, is 55 to 60%. The left ventricle has normal function. The left ventricle has no regional wall motion abnormalities. The average left ventricular global longitudinal strain is -17.4 %. Strain was performed and the global longitudinal strain is normal. The left ventricular internal cavity size was normal in size. There is borderline left ventricular hypertrophy. Left ventricular diastolic parameters are consistent with Grade I diastolic  dysfunction (impaired relaxation). Right Ventricle: The right ventricular size is normal. No increase in right ventricular wall thickness. Right ventricular systolic function is normal. There is normal pulmonary artery systolic pressure. The tricuspid regurgitant velocity is 2.26 m/s, and  with an assumed  right atrial pressure of 3 mmHg, the estimated right ventricular systolic pressure is 23.4 mmHg. Left Atrium: Left atrial size was normal in size. Right Atrium: Right atrial size was normal in size. Pericardium: There is no evidence of pericardial effusion. Mitral Valve: The mitral valve is normal in structure. Mild mitral valve regurgitation. No evidence of mitral valve stenosis. Tricuspid Valve: The tricuspid valve is normal in structure. Tricuspid valve regurgitation is mild . No evidence of tricuspid stenosis. Aortic Valve: The aortic valve is normal in structure. Aortic valve regurgitation is mild. Aortic regurgitation PHT measures 479 msec. Aortic valve sclerosis is present, with no evidence of aortic valve stenosis. Aortic valve peak gradient measures 10.2 mmHg. Pulmonic Valve: The pulmonic valve was normal in structure. Pulmonic valve regurgitation is not visualized. No evidence of pulmonic stenosis. Aorta: The aortic root is normal in size and structure. Venous: The inferior vena cava is normal in size with greater than 50% respiratory variability, suggesting right atrial pressure of 3 mmHg. IAS/Shunts: No atrial level shunt detected by color flow Doppler.  LEFT VENTRICLE PLAX 2D LVIDd:         4.20 cm   Diastology LVIDs:         2.90 cm   LV e' medial:    10.20 cm/s LV PW:         0.90 cm   LV E/e' medial:  8.1 LV IVS:        1.10 cm   LV e' lateral:   10.20 cm/s LVOT diam:     2.00 cm   LV E/e' lateral: 8.1 LV SV:         77 LV SV Index:   57        2D Longitudinal Strain LVOT Area:     3.14 cm  2D Strain GLS Avg:     -17.4 %  RIGHT VENTRICLE RV Basal diam:  3.00 cm RV S prime:     14.70 cm/s TAPSE (M-mode): 2.1 cm LEFT ATRIUM             Index        RIGHT ATRIUM           Index LA diam:        4.30 cm 3.20 cm/m   RA Area:     14.10 cm LA Vol (A2C):   29.0 ml 21.57 ml/m  RA Volume:   32.90 ml  24.48 ml/m LA Vol (A4C):   32.5 ml 24.18 ml/m LA Biplane Vol: 31.0 ml 23.06 ml/m  AORTIC VALVE                  PULMONIC VALVE AV Area (Vmax): 2.73 cm     PV Vmax:        1.14 m/s AV Vmax:        160.00 cm/s  PV Peak grad:   5.2 mmHg AV Peak Grad:   10.2 mmHg    RVOT Peak grad: 3 mmHg LVOT Vmax:      139.00 cm/s LVOT Vmean:     95.200 cm/s LVOT VTI:       0.244 m AI PHT:         479 msec  AORTA Ao Root diam: 2.70 cm Ao Asc diam:  3.10 cm MITRAL VALVE                TRICUSPID VALVE MV Area (PHT): 4.71 cm     TR Peak grad:   20.4 mmHg MV Decel Time: 161 msec     TR Vmax:        226.00 cm/s MV E velocity: 82.30 cm/s MV A velocity: 105.00 cm/s  SHUNTS MV E/A ratio:  0.78         Systemic VTI:  0.24 m                             Systemic Diam: 2.00 cm Antionette Kirks MD Electronically signed by Antionette Kirks MD Signature Date/Time: 05/19/2023/11:01:28 AM    Final    CT HEAD WO CONTRAST ( ) Result Date: 05/17/2023 CLINICAL DATA:  Head trauma. Generalized weakness. Recent unwitnessed fall. EXAM: CT HEAD WITHOUT CONTRAST TECHNIQUE: Contiguous axial images were obtained from the base of the skull through the vertex without intravenous contrast. RADIATION DOSE REDUCTION: This exam was performed according to the departmental dose-optimization program which includes automated exposure control, adjustment of the mA and/or kV according to patient size and/or use of iterative reconstruction technique. COMPARISON:  CT head without contrast 08/16/2022. FINDINGS: Brain: Periventricular and scattered subcortical white matter hypoattenuation is mildly advanced for age. This is similar the prior exam. No acute infarct, hemorrhage, or mass lesion is present. Deep brain nuclei are within normal limits. The ventricles are of normal size. No significant extraaxial fluid collection is present. The brainstem and cerebellum are within normal limits. Midline structures are within normal limits. Vascular: Faint atherosclerotic calcifications are present within the cavernous internal carotid arteries and at the dural margin of the right  vertebral artery. No hyperdense vessel is present. Skull: Calvarium is intact. No focal lytic or blastic lesions are present. No significant extracranial soft tissue lesion is present. Sinuses/Orbits: A fluid level is present left maxillary sinus scratched at a fluid level is present in  the left sphenoid sinus. Minimal mucosal thickening is present in the right sphenoid sinus. The paranasal sinuses and mastoid air cells are otherwise clear. The globes and orbits are within normal limits. IMPRESSION: 1. No acute intracranial abnormality or significant interval change. 2. Periventricular and scattered subcortical white matter hypoattenuation is mildly advanced for age. This likely reflects the sequela of chronic microvascular ischemia. 3. Fluid levels in the left maxillary and left sphenoid sinuses. This may reflect acute sinusitis in the appropriate clinical setting. Electronically Signed   By: Audree Leas M.D.   On: 05/17/2023 14:10   DG Chest Portable 1 View Result Date: 05/17/2023 CLINICAL DATA:  Weakness.  Recent episode of COVID. EXAM: PORTABLE CHEST 1 VIEW COMPARISON:  One-view chest x-ray 03/03/2021. FINDINGS: The heart is enlarged. Moderate pulmonary vascular congestion is present bilaterally. Small effusions are suspected. No significant airspace consolidation is present. The visualized soft tissues and bony thorax are unremarkable. IMPRESSION: 1. Cardiomegaly and moderate pulmonary vascular congestion. 2. Small effusions. Electronically Signed   By: Audree Leas M.D.   On: 05/17/2023 14:06   DG Pelvis 1-2 Views Result Date: 05/17/2023 CLINICAL DATA:  Fall.  Dementia. EXAM: PELVIS - 1-2 VIEW COMPARISON:  09/03/2020 FINDINGS: Single AP view of the pelvis demonstrates marked left and moderate right hip joint space narrowing with subchondral sclerosis. Given mild osteopenia, no acute fracture identified. Degenerative sclerosis of the right sacroiliac joint. Remote trauma or degenerative  irregularity of the left symphysis pubis is new since 2022. IMPRESSION: Degenerative change, without acute osseous finding. Electronically Signed   By: Lore Rode M.D.   On: 05/17/2023 14:05     Assessment and Plan:   Acute diastolic heart failure in the setting of significant blood loss anemia.  This seems to be overall mild and her volume overload improved significantly.  Her echocardiogram which was done yesterday appeared very reassuring with normal ejection fraction and only grade 1 diastolic dysfunction.  Pulmonary pressure was normal and IVC was normal size and reactive.  This indicates likely euvolemic state.  Can continue current dose of furosemide as long as renal function remains stable. Preop cardiovascular evaluation for GI endoscopic procedures: Given the patient's improved symptoms and unremarkable echo, she can proceed at an acceptable cardiac risk.  I requested an EKG. Atypical chest pain: Will consider outpatient ischemic evaluation with Lexiscan Myoview.   For questions or updates, please contact [facility] Please consult [email or web address] for contact info under    Signed, Antionette Kirks, MD  05/19/2023 12:59 PM

## 2023-05-19 NOTE — Progress Notes (Signed)
 [name], MD [facility]   [address], [address] [place], [place] [address] Phone: [phone] Fax : [phone]   Subjective: Patient was seen this weekend by Dr. Emerick Hanlon and I am following up with the patient today.  Patient reports that she has had abdominal pain.  She has not followed up with a previous outpatient referral for workup of anemia.  The patient has a complicated medical history and was recommended to be evaluated by cardiology prior to any GI intervention.   Objective: Vital signs in last 24 hours: Vitals:   05/18/23 1559 05/18/23 2004 05/19/23 0410 05/19/23 0500  BP: 105/66 116/74 120/64   Pulse: (!) 102 96 94   Resp: 18     Temp: 98.1 F (36.7 C) 98.1 F (36.7 C) 98.7 F (37.1 C)   TempSrc:      SpO2: 96% 95% 95%   Weight:    42.3 kg  Height:       Weight change: -3.513 kg  Intake/Output Summary (Last 24 hours) at 05/19/2023 0755 Last data filed at 05/18/2023 1500 Gross per 24 hour  Intake 0 ml  Output 1100 ml  Net -1100 ml     Exam: Heart:: Regular rate and rhythm or without murmur or extra heart sounds Lungs: normal and clear to auscultation and percussion Abdomen: soft, mild diffuse tenderness, normal bowel sounds   Lab Results: @LABTEST2 @ Micro Results: Recent Results (from the past 240 hours)  Gastrointestinal Panel by PCR , Stool     Status: None   Collection Time: 05/18/23 11:00 AM   Specimen: Stool  Result Value Ref Range Status   Campylobacter species NOT DETECTED NOT DETECTED Final   Plesimonas shigelloides NOT DETECTED NOT DETECTED Final   Salmonella species NOT DETECTED NOT DETECTED Final   Yersinia enterocolitica NOT DETECTED NOT DETECTED Final   Vibrio species NOT DETECTED NOT DETECTED Final   Vibrio cholerae NOT DETECTED NOT DETECTED Final   Enteroaggregative E coli (EAEC) NOT DETECTED NOT DETECTED Final   Enteropathogenic E coli (EPEC) NOT DETECTED NOT DETECTED Final   Enterotoxigenic E coli (ETEC) NOT DETECTED NOT  DETECTED Final   Shiga like toxin producing E coli (STEC) NOT DETECTED NOT DETECTED Final   Shigella/Enteroinvasive E coli (EIEC) NOT DETECTED NOT DETECTED Final   Cryptosporidium NOT DETECTED NOT DETECTED Final   Cyclospora cayetanensis NOT DETECTED NOT DETECTED Final   Entamoeba histolytica NOT DETECTED NOT DETECTED Final   Giardia lamblia NOT DETECTED NOT DETECTED Final   Adenovirus F40/41 NOT DETECTED NOT DETECTED Final   Astrovirus NOT DETECTED NOT DETECTED Final   Norovirus GI/GII NOT DETECTED NOT DETECTED Final   Rotavirus A NOT DETECTED NOT DETECTED Final   Sapovirus (I, II, IV, and V) NOT DETECTED NOT DETECTED Final    Comment: Performed at [facility], [address], [place], [place] [address]   Studies/Results: CT HEAD WO CONTRAST ( ) Result Date: 05/17/2023 CLINICAL DATA:  Head trauma. Generalized weakness. Recent unwitnessed fall. EXAM: CT HEAD WITHOUT CONTRAST TECHNIQUE: Contiguous axial images were obtained from the base of the skull through the vertex without intravenous contrast. RADIATION DOSE REDUCTION: This exam was performed according to the departmental dose-optimization program which includes automated exposure control, adjustment of the mA and/or kV according to patient size and/or use of iterative reconstruction technique. COMPARISON:  CT head without contrast 08/16/2022. FINDINGS: Brain: Periventricular and scattered subcortical white matter hypoattenuation is mildly advanced for age. This is similar the prior exam. No acute infarct, hemorrhage,  or mass lesion is present. Deep brain nuclei are within normal limits. The ventricles are of normal size. No significant extraaxial fluid collection is present. The brainstem and cerebellum are within normal limits. Midline structures are within normal limits. Vascular: Faint atherosclerotic calcifications are present within the cavernous internal carotid arteries and at the dural margin of the right vertebral artery.  No hyperdense vessel is present. Skull: Calvarium is intact. No focal lytic or blastic lesions are present. No significant extracranial soft tissue lesion is present. Sinuses/Orbits: A fluid level is present left maxillary sinus scratched at a fluid level is present in the left sphenoid sinus. Minimal mucosal thickening is present in the right sphenoid sinus. The paranasal sinuses and mastoid air cells are otherwise clear. The globes and orbits are within normal limits. IMPRESSION: 1. No acute intracranial abnormality or significant interval change. 2. Periventricular and scattered subcortical white matter hypoattenuation is mildly advanced for age. This likely reflects the sequela of chronic microvascular ischemia. 3. Fluid levels in the left maxillary and left sphenoid sinuses. This may reflect acute sinusitis in the appropriate clinical setting. Electronically Signed   By: Audree Leas M.D.   On: 05/17/2023 14:10   DG Chest Portable 1 View Result Date: 05/17/2023 CLINICAL DATA:  Weakness.  Recent episode of COVID. EXAM: PORTABLE CHEST 1 VIEW COMPARISON:  One-view chest x-ray 03/03/2021. FINDINGS: The heart is enlarged. Moderate pulmonary vascular congestion is present bilaterally. Small effusions are suspected. No significant airspace consolidation is present. The visualized soft tissues and bony thorax are unremarkable. IMPRESSION: 1. Cardiomegaly and moderate pulmonary vascular congestion. 2. Small effusions. Electronically Signed   By: Audree Leas M.D.   On: 05/17/2023 14:06   DG Pelvis 1-2 Views Result Date: 05/17/2023 CLINICAL DATA:  Fall.  Dementia. EXAM: PELVIS - 1-2 VIEW COMPARISON:  09/03/2020 FINDINGS: Single AP view of the pelvis demonstrates marked left and moderate right hip joint space narrowing with subchondral sclerosis. Given mild osteopenia, no acute fracture identified. Degenerative sclerosis of the right sacroiliac joint. Remote trauma or degenerative irregularity of the  left symphysis pubis is new since 2022. IMPRESSION: Degenerative change, without acute osseous finding. Electronically Signed   By: Lore Rode M.D.   On: 05/17/2023 14:05   Medications: I have reviewed the patient's current medications. Scheduled Meds:  furosemide  40 mg Oral Daily   levothyroxine   88 mcg Oral QAC breakfast   pantoprazole  (PROTONIX ) IV  40 mg Intravenous Q12H   QUEtiapine   25 mg Oral QHS   sertraline   100 mg Oral QHS   sodium chloride  flush  3 mL Intravenous Q12H   Continuous Infusions: PRN Meds:.acetaminophen , ondansetron  **OR** ondansetron  (ZOFRAN ) IV, sodium chloride  flush   Assessment: Active Problems:   History of essential hypertension   Major depressive disorder, recurrent severe without psychotic features (HCC)   Hypothyroidism   GI bleed   Weakness   Heart failure (HCC)    Plan: The patient was seen today with a stable hemoglobin.  The patient's hemoglobin was 10.0 and today is 9.6.  The patient has been recommended in the past to undergo GI workup including an EGD and colonoscopy.  The patient reports that she does not want to pursue any GI workup at this time.  In defense of her decision her iron studies have shown normal iron with an elevated ferritin and a normal MCV.  Since the patient is refusing any GI workup at this time,   I will sign off.  Please call if any further  GI concerns or questions.  We would like to thank you for the opportunity to participate in the care of Elizabeth [name].    LOS: 0 days   [name], MD.FACG 05/19/2023, 7:55 AM Pager [phone] 7am-5pm  Check AMION for 5pm -7am coverage and on weekends

## 2023-05-19 NOTE — Progress Notes (Addendum)
 Progress Note    Elizabeth [name]  ZOX:[id number] DOB: 03-23-1950  DOA: 05/17/2023 PCP: [name]      Brief Narrative:    Medical records reviewed and are as summarized below:  Elizabeth [name] is a 73 y.o. 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    She presented to the hospital with diarrhea, shortness of breath and general weakness for about 2 weeks duration.  Reportedly, she had some dark stools.  She has also been falling at home.     Assessment/Plan:   Active Problems:   Major depressive disorder, recurrent severe without psychotic features (HCC)   GI bleed   Weakness   Heart failure (HCC)   Hypothyroidism   History of essential hypertension   Symptomatic anemia    Body mass index is 18.21 kg/m.   Acute GI bleeding, melena: Continue IV Protonix .  Plan for endoscopic workup per Dr. Ole Berkeley, per nephrologist.  Diarrhea may possibly be related to recent COVID-19 infection Recent COVID-19 infection on 04/29/2023.   Acute blood loss anemia, history of chronic iron deficiency anemia: S/p transfusion 1 unit of PRBCs on 05/17/2023 for hemoglobin of 7.1.  Posttransfusion H&H stable.  Hemoglobin at 9.6. Hemoglobin was 8 on admission. Iron studies: Iron 47, TIBC 122, saturation ratio 39 and ferritin 830.  This  essentially rules out iron deficiency anemia at this time. Vitamin B12 625   Acute diastolic heart failure: Consulted Dr. Alvenia Aus, cardiologist.  He said patient is stable from cardiac perspective for endoscopy.   S/p treatment with IV Lasix.  Discontinue oral Lasix because creatinine is trending upward and she is also having diarrhea. BNP 311.6 2D echo showed EF estimated at 55 to 60%, grade 1 diastolic dysfunction, mild MR   Hypokalemia: Improved.  Continue potassium repletion   Elevated vitamin D  level (101.94): patient was taking high-dose vitamin D  prior to admission.  This will be discontinued at discharge.   General Weakness: PT recommended discharge to SNF   Comorbidities include bipolar disorder, schizophrenia, anxiety, depression, depression, history of breast cancer s/p mastectomy in February 2023 (on letrozole ).   Plan discussed with Elizabeth [name], son, at the bedside.  He wants to pursue endoscopic workup to figure out "where the blood is coming from"   Diet Order             Diet NPO time specified  Diet effective midnight                            Consultants: Gastroenterologist Cardiologist  Procedures: None    Medications:    levothyroxine   88 mcg Oral QAC breakfast   pantoprazole   40 mg Oral BID   polyethylene glycol-electrolytes  4,000 mL Oral Once   QUEtiapine   25 mg Oral QHS   sertraline   100 mg Oral QHS   sodium chloride  flush  3 mL Intravenous Q12H   Continuous Infusions:  Anti-infectives (From admission, onward)    None              Family Communication/Anticipated D/C date and plan/Code Status   DVT prophylaxis: Place TED hose Start: 05/17/23 1454     Code Status: Full Code  Family Communication: Plan discussed with Elizabeth [name], son, over the phone. Disposition Plan: Plan to discharge home   Status is: Observation The patient will require care spanning > 2 midnights and should be moved to inpatient because: GI  bleed, CHF, general weakness       Subjective:   Interval events noted.  She complains of diarrhea.  Chart review shows she is having small, brown type VI stools.  She still feels weak.  No other complaints.  Son was at the bedside.  Objective:    Vitals:   05/18/23 2004 05/19/23 0410 05/19/23 0500 05/19/23 0802  BP: 116/74 120/64  117/68  Pulse: 96 94  97  Resp:    18  Temp: 98.1 F (36.7 C) 98.7 F (37.1 C)    TempSrc:      SpO2: 95% 95%  97%  Weight:   42.3 kg   Height:       No data found.   Intake/Output Summary (Last 24 hours) at 05/19/2023 1318 Last data filed at 05/19/2023 0942 Gross per 24 hour  Intake 0 ml  Output 500 ml  Net -500 ml   Filed Weights   05/17/23 1235 05/18/23 0500 05/19/23 0500  Weight: 45.8 kg 41.9 kg 42.3 kg    Exam:  GEN: NAD SKIN: Warm and dry EYES: No pallor or icterus ENT: MMM CV: RRR PULM: CTA B ABD: soft, ND, NT, +BS CNS: AAO x 4, non focal EXT: No edema or tenderness      Data Reviewed:   I have personally reviewed following labs and imaging studies:  Labs: Labs show the following:   Basic Metabolic Panel: Recent Labs  Lab 05/17/23 1335 05/18/23 0319 05/18/23 0415 05/19/23 0436  NA 141 141  --  140  K 3.4* 3.0*  --  3.9  CL 109 106  --  102  CO2 19* 24  --  26  GLUCOSE 124* 89  --  90  BUN 32* 25*  --  24*  CREATININE 0.93 0.79  --  1.05*  CALCIUM  7.9* 7.7*  --  8.0*  MG  --  2.0  --   --   PHOS  --   --  2.9 3.3   GFR Estimated Creatinine Clearance: 32.3 mL/min (A) (by C-G formula based on SCr of 1.05 mg/dL (H)). Liver Function Tests: Recent Labs  Lab 05/17/23 1335 05/18/23 0319 05/19/23 0436  AST 91* 84*  --   ALT 27 24  --   ALKPHOS 281* 258*  --   BILITOT 0.6 0.8  --   PROT 6.4* 6.2*  --   ALBUMIN  2.2* 2.0* 2.0*   Recent Labs  Lab 05/17/23 1335  LIPASE 24   No results for input(s): "AMMONIA" in the last 168 hours. Coagulation profile No results for input(s): "INR", "PROTIME" in  the last 168 hours.  CBC: Recent Labs  Lab 05/17/23 1335 05/17/23 1639 05/17/23 2251 05/18/23 0319 05/18/23 0908 05/19/23 0435  WBC 7.6  --   --   --   --  6.3  HGB 8.0* 7.1* 8.6* 8.7* 10.0* 9.6*  HCT 25.3* 21.8* 25.2* 25.8* 29.2* 28.4*  MCV 88.2  --   --   --   --  84.3  PLT 411*  --   --   --   --  391   Cardiac Enzymes: No results for input(s): "CKTOTAL", "CKMB", "CKMBINDEX", "TROPONINI" in the last 168 hours. BNP (last 3 results) No results for input(s): "PROBNP" in the last 8760 hours. CBG: No results for input(s): "GLUCAP" in the last 168 hours. D-Dimer: No results for input(s): "DDIMER" in the last 72 hours. Hgb A1c: No results for input(s): "HGBA1C" in the last 72 hours. Lipid Profile: No results for input(s): "CHOL", "HDL", "LDLCALC", "TRIG", "CHOLHDL", "LDLDIRECT" in the last 72 hours. Thyroid function studies: Recent Labs    05/17/23 1639  TSH 5.246*   Anemia work up: Recent Labs    05/18/23 0415 05/19/23 0435  VITAMINB12  --  625  FERRITIN 830*  --   TIBC 122*  --   IRON 47  --    Sepsis Labs: Recent Labs  Lab 05/17/23 1335 05/19/23 0435  WBC 7.6 6.3    Microbiology Recent Results (from the past 240 hours)  Gastrointestinal Panel by PCR , Stool     Status: None   Collection Time: 05/18/23 11:00 AM   Specimen: Stool  Result Value Ref Range Status   Campylobacter species NOT DETECTED NOT DETECTED Final   Plesimonas shigelloides NOT DETECTED NOT DETECTED Final   Salmonella species NOT DETECTED NOT DETECTED Final   Yersinia enterocolitica NOT DETECTED NOT DETECTED Final   Vibrio species NOT DETECTED NOT DETECTED Final   Vibrio cholerae NOT DETECTED NOT DETECTED Final   Enteroaggregative E coli (EAEC) NOT DETECTED NOT DETECTED Final   Enteropathogenic E coli (EPEC) NOT DETECTED NOT DETECTED Final   Enterotoxigenic E coli (ETEC) NOT DETECTED NOT DETECTED Final   Shiga like toxin producing E coli (STEC) NOT DETECTED NOT DETECTED Final    Shigella/Enteroinvasive E coli (EIEC) NOT DETECTED NOT DETECTED Final   Cryptosporidium NOT DETECTED NOT DETECTED Final   Cyclospora cayetanensis NOT DETECTED NOT DETECTED Final   Entamoeba histolytica NOT DETECTED NOT DETECTED Final   Giardia lamblia NOT DETECTED NOT DETECTED Final   Adenovirus F40/41 NOT DETECTED NOT DETECTED Final   Astrovirus NOT DETECTED NOT DETECTED Final   Norovirus GI/GII NOT DETECTED NOT DETECTED Final   Rotavirus A NOT DETECTED NOT DETECTED Final   Sapovirus (I, II, IV, and V) NOT DETECTED NOT DETECTED Final    Comment: Performed at [facility], [address] [place], [place] [address]    Procedures and diagnostic studies:  ECHOCARDIOGRAM COMPLETE Result Date: 05/19/2023    ECHOCARDIOGRAM REPORT   Patient Name:   AVEY SCOLARO [facility] Date of Exam: 05/18/2023 Medical Rec #:  [id number]           Height:       60.0 in Accession #:    [id number]          Weight:       92.4 lb Date of Birth:  1950/12/15          BSA:          1.344 m Patient Age:    72 years            BP:           0/0 mmHg Patient Gender: F                   HR:           90 bpm. Exam Location:  [facility] Procedure: 2D Echo, Cardiac Doppler, Color Doppler  and Strain Analysis (Both            Spectral and Color Flow Doppler were utilized during procedure). Indications:     CHF- Acute Diastolic I50.31  History:         Patient has no prior history of Echocardiogram examinations.  Sonographer:     Jane Meager RDCS Referring Phys:  [id number] Alayne Allis NEWTON Diagnosing Phys: Antionette Kirks MD  Sonographer Comments: Image acquisition challenging due to mastectomy and Image acquisition challenging due to respiratory motion. IMPRESSIONS  1. Left ventricular ejection fraction, by estimation, is 55 to 60%. The left ventricle has normal function. The left ventricle has no regional wall motion abnormalities. Left ventricular diastolic parameters are consistent with Grade I diastolic dysfunction (impaired relaxation).  The average left ventricular global longitudinal strain is -17.4 %. The global longitudinal strain is normal.  2. Right ventricular systolic function is normal. The right ventricular size is normal. There is normal pulmonary artery systolic pressure. The estimated right ventricular systolic pressure is 23.4 mmHg.  3. The mitral valve is normal in structure. Mild mitral valve regurgitation. No evidence of mitral stenosis.  4. The aortic valve is normal in structure. Aortic valve regurgitation is mild. Aortic valve sclerosis is present, with no evidence of aortic valve stenosis.  5. The inferior vena cava is normal in size with greater than 50% respiratory variability, suggesting right atrial pressure of 3 mmHg. FINDINGS  Left Ventricle: Left ventricular ejection fraction, by estimation, is 55 to 60%. The left ventricle has normal function. The left ventricle has no regional wall motion abnormalities. The average left ventricular global longitudinal strain is -17.4 %. Strain was performed and the global longitudinal strain is normal. The left ventricular internal cavity size was normal in size. There is borderline left ventricular hypertrophy. Left ventricular diastolic parameters are consistent with Grade I diastolic  dysfunction (impaired relaxation). Right Ventricle: The right ventricular size is normal. No increase in right ventricular wall thickness. Right ventricular systolic function is normal. There is normal pulmonary artery systolic pressure. The tricuspid regurgitant velocity is 2.26 m/s, and  with an assumed right atrial pressure of 3 mmHg, the estimated right ventricular systolic pressure is 23.4 mmHg. Left Atrium: Left atrial size was normal in size. Right Atrium: Right atrial size was normal in size. Pericardium: There is no evidence of pericardial effusion. Mitral Valve: The mitral valve is normal in structure. Mild mitral valve regurgitation. No evidence of mitral valve stenosis. Tricuspid Valve: The  tricuspid valve is normal in structure. Tricuspid valve regurgitation is mild . No evidence of tricuspid stenosis. Aortic Valve: The aortic valve is normal in structure. Aortic valve regurgitation is mild. Aortic regurgitation PHT measures 479 msec. Aortic valve sclerosis is present, with no evidence of aortic valve stenosis. Aortic valve peak gradient measures 10.2 mmHg. Pulmonic Valve: The pulmonic valve was normal in structure. Pulmonic valve regurgitation is not visualized. No evidence of pulmonic stenosis. Aorta: The aortic root is normal in size and structure. Venous: The inferior vena cava is normal in size with greater than 50% respiratory variability, suggesting right atrial pressure of 3 mmHg. IAS/Shunts: No atrial level shunt detected by color flow Doppler.  LEFT VENTRICLE PLAX 2D LVIDd:         4.20 cm   Diastology LVIDs:         2.90 cm   LV e' medial:    10.20 cm/s LV PW:         0.90 cm   LV  E/e' medial:  8.1 LV IVS:        1.10 cm   LV e' lateral:   10.20 cm/s LVOT diam:     2.00 cm   LV E/e' lateral: 8.1 LV SV:         77 LV SV Index:   57        2D Longitudinal Strain LVOT Area:     3.14 cm  2D Strain GLS Avg:     -17.4 %  RIGHT VENTRICLE RV Basal diam:  3.00 cm RV S prime:     14.70 cm/s TAPSE (M-mode): 2.1 cm LEFT ATRIUM             Index        RIGHT ATRIUM           Index LA diam:        4.30 cm 3.20 cm/m   RA Area:     14.10 cm LA Vol (A2C):   29.0 ml 21.57 ml/m  RA Volume:   32.90 ml  24.48 ml/m LA Vol (A4C):   32.5 ml 24.18 ml/m LA Biplane Vol: 31.0 ml 23.06 ml/m  AORTIC VALVE                 PULMONIC VALVE AV Area (Vmax): 2.73 cm     PV Vmax:        1.14 m/s AV Vmax:        160.00 cm/s  PV Peak grad:   5.2 mmHg AV Peak Grad:   10.2 mmHg    RVOT Peak grad: 3 mmHg LVOT Vmax:      139.00 cm/s LVOT Vmean:     95.200 cm/s LVOT VTI:       0.244 m AI PHT:         479 msec  AORTA Ao Root diam: 2.70 cm Ao Asc diam:  3.10 cm MITRAL VALVE                TRICUSPID VALVE MV Area (PHT): 4.71 cm      TR Peak grad:   20.4 mmHg MV Decel Time: 161 msec     TR Vmax:        226.00 cm/s MV E velocity: 82.30 cm/s MV A velocity: 105.00 cm/s  SHUNTS MV E/A ratio:  0.78         Systemic VTI:  0.24 m                             Systemic Diam: 2.00 cm Antionette Kirks MD Electronically signed by Antionette Kirks MD Signature Date/Time: 05/19/2023/11:01:28 AM    Final    CT HEAD WO CONTRAST ( ) Result Date: 05/17/2023 CLINICAL DATA:  Head trauma. Generalized weakness. Recent unwitnessed fall. EXAM: CT HEAD WITHOUT CONTRAST TECHNIQUE: Contiguous axial images were obtained from the base of the skull through the vertex without intravenous contrast. RADIATION DOSE REDUCTION: This exam was performed according to the departmental dose-optimization program which includes automated exposure control, adjustment of the mA and/or kV according to patient size and/or use of iterative reconstruction technique. COMPARISON:  CT head without contrast 08/16/2022. FINDINGS: Brain: Periventricular and scattered subcortical white matter hypoattenuation is mildly advanced for age. This is similar the prior exam. No acute infarct, hemorrhage, or mass lesion is present. Deep brain nuclei are within normal limits. The ventricles are of normal size. No significant extraaxial fluid collection is present. The brainstem and cerebellum are  within normal limits. Midline structures are within normal limits. Vascular: Faint atherosclerotic calcifications are present within the cavernous internal carotid arteries and at the dural margin of the right vertebral artery. No hyperdense vessel is present. Skull: Calvarium is intact. No focal lytic or blastic lesions are present. No significant extracranial soft tissue lesion is present. Sinuses/Orbits: A fluid level is present left maxillary sinus scratched at a fluid level is present in the left sphenoid sinus. Minimal mucosal thickening is present in the right sphenoid sinus. The paranasal sinuses and mastoid  air cells are otherwise clear. The globes and orbits are within normal limits. IMPRESSION: 1. No acute intracranial abnormality or significant interval change. 2. Periventricular and scattered subcortical white matter hypoattenuation is mildly advanced for age. This likely reflects the sequela of chronic microvascular ischemia. 3. Fluid levels in the left maxillary and left sphenoid sinuses. This may reflect acute sinusitis in the appropriate clinical setting. Electronically Signed   By: Audree Leas M.D.   On: 05/17/2023 14:10   DG Chest Portable 1 View Result Date: 05/17/2023 CLINICAL DATA:  Weakness.  Recent episode of COVID. EXAM: PORTABLE CHEST 1 VIEW COMPARISON:  One-view chest x-ray 03/03/2021. FINDINGS: The heart is enlarged. Moderate pulmonary vascular congestion is present bilaterally. Small effusions are suspected. No significant airspace consolidation is present. The visualized soft tissues and bony thorax are unremarkable. IMPRESSION: 1. Cardiomegaly and moderate pulmonary vascular congestion. 2. Small effusions. Electronically Signed   By: Audree Leas M.D.   On: 05/17/2023 14:06   DG Pelvis 1-2 Views Result Date: 05/17/2023 CLINICAL DATA:  Fall.  Dementia. EXAM: PELVIS - 1-2 VIEW COMPARISON:  09/03/2020 FINDINGS: Single AP view of the pelvis demonstrates marked left and moderate right hip joint space narrowing with subchondral sclerosis. Given mild osteopenia, no acute fracture identified. Degenerative sclerosis of the right sacroiliac joint. Remote trauma or degenerative irregularity of the left symphysis pubis is new since 2022. IMPRESSION: Degenerative change, without acute osseous finding. Electronically Signed   By: Lore Rode M.D.   On: 05/17/2023 14:05               LOS: 0 days   Threasa Kinch  [facility]   Pager on [email or web address]. If 7PM-7AM, please contact night-coverage at [email or web address]     05/19/2023, 1:18 PM

## 2023-05-20 ENCOUNTER — Observation Stay: Admitting: Anesthesiology

## 2023-05-20 ENCOUNTER — Encounter
Admission: EM | Disposition: A | Discharge status: Home Health | Payer: Self-pay | Source: Home / Self Care | Attending: Family Medicine

## 2023-05-20 ENCOUNTER — Encounter: Payer: Self-pay | Admitting: Gastroenterology

## 2023-05-20 DIAGNOSIS — D649 Anemia, unspecified: Secondary | ICD-10-CM | POA: Diagnosis not present

## 2023-05-20 DIAGNOSIS — I5033 Acute on chronic diastolic (congestive) heart failure: Secondary | ICD-10-CM

## 2023-05-20 DIAGNOSIS — K922 Gastrointestinal hemorrhage, unspecified: Secondary | ICD-10-CM | POA: Diagnosis not present

## 2023-05-20 DIAGNOSIS — I5031 Acute diastolic (congestive) heart failure: Secondary | ICD-10-CM | POA: Diagnosis not present

## 2023-05-20 DIAGNOSIS — N179 Acute kidney failure, unspecified: Secondary | ICD-10-CM | POA: Diagnosis not present

## 2023-05-20 DIAGNOSIS — K640 First degree hemorrhoids: Secondary | ICD-10-CM

## 2023-05-20 HISTORY — PX: COLONOSCOPY: SHX5424

## 2023-05-20 LAB — CBC WITH DIFFERENTIAL/PLATELET
Abs Immature Granulocytes: 0.16 10*3/uL — ABNORMAL HIGH (ref 0.00–0.07)
Basophils Absolute: 0 10*3/uL (ref 0.0–0.1)
Basophils Relative: 0 %
Eosinophils Absolute: 0.1 10*3/uL (ref 0.0–0.5)
Eosinophils Relative: 1 %
HCT: 29.2 % — ABNORMAL LOW (ref 36.0–46.0)
Hemoglobin: 9.8 g/dL — ABNORMAL LOW (ref 12.0–15.0)
Immature Granulocytes: 2 %
Lymphocytes Relative: 7 %
Lymphs Abs: 0.6 10*3/uL — ABNORMAL LOW (ref 0.7–4.0)
MCH: 28.7 pg (ref 26.0–34.0)
MCHC: 33.6 g/dL (ref 30.0–36.0)
MCV: 85.4 fL (ref 80.0–100.0)
Monocytes Absolute: 0.4 10*3/uL (ref 0.1–1.0)
Monocytes Relative: 4 %
Neutro Abs: 7.7 10*3/uL (ref 1.7–7.7)
Neutrophils Relative %: 86 %
Platelets: 402 10*3/uL — ABNORMAL HIGH (ref 150–400)
RBC: 3.42 MIL/uL — ABNORMAL LOW (ref 3.87–5.11)
RDW: 20.2 % — ABNORMAL HIGH (ref 11.5–15.5)
WBC: 9 10*3/uL (ref 4.0–10.5)
nRBC: 0 % (ref 0.0–0.2)

## 2023-05-20 LAB — COMPREHENSIVE METABOLIC PANEL WITH GFR
ALT: 28 U/L (ref 0–44)
AST: 152 U/L — ABNORMAL HIGH (ref 15–41)
Albumin: 2.2 g/dL — ABNORMAL LOW (ref 3.5–5.0)
Alkaline Phosphatase: 297 U/L — ABNORMAL HIGH (ref 38–126)
Anion gap: 12 (ref 5–15)
BUN: 26 mg/dL — ABNORMAL HIGH (ref 8–23)
CO2: 23 mmol/L (ref 22–32)
Calcium: 8.2 mg/dL — ABNORMAL LOW (ref 8.9–10.3)
Chloride: 100 mmol/L (ref 98–111)
Creatinine, Ser: 1.32 mg/dL — ABNORMAL HIGH (ref 0.44–1.00)
GFR, Estimated: 43 mL/min — ABNORMAL LOW (ref >60)
Glucose, Bld: 97 mg/dL (ref 70–99)
Potassium: 4.3 mmol/L (ref 3.5–5.1)
Sodium: 135 mmol/L (ref 135–145)
Total Bilirubin: 0.9 mg/dL (ref 0.0–1.2)
Total Protein: 6.5 g/dL (ref 6.5–8.1)

## 2023-05-20 LAB — MAGNESIUM: Magnesium: 1.8 mg/dL (ref 1.7–2.4)

## 2023-05-20 SURGERY — COLONOSCOPY
Anesthesia: General

## 2023-05-20 MED ORDER — LIDOCAINE HCL (CARDIAC) PF 100 MG/5ML IV SOSY
PREFILLED_SYRINGE | INTRAVENOUS | Status: DC | PRN
  Administered 2023-05-20: 100 mg via INTRAVENOUS

## 2023-05-20 MED ORDER — PROPOFOL 500 MG/50ML IV EMUL
INTRAVENOUS | Status: DC | PRN
  Administered 2023-05-20: 145 ug/kg/min via INTRAVENOUS

## 2023-05-20 MED ORDER — SODIUM CHLORIDE 0.9 % IV SOLN: INTRAVENOUS | Status: DC

## 2023-05-20 MED ORDER — LACTATED RINGERS IV SOLN: INTRAVENOUS | Status: DC

## 2023-05-20 MED ORDER — PROPOFOL 10 MG/ML IV BOLUS
INTRAVENOUS | Status: DC | PRN
  Administered 2023-05-20: 70 mg via INTRAVENOUS
  Administered 2023-05-20: 30 mg via INTRAVENOUS

## 2023-05-20 MED ORDER — LACTATED RINGERS IV SOLN: INTRAVENOUS | Status: AC

## 2023-05-20 NOTE — NC FL2 (Signed)
 [place]  MEDICAID FL2 LEVEL OF CARE FORM     IDENTIFICATION  Patient Name: Elizabeth [name] Birthdate: [birth year] Sex: female Admission Date (Current Location): 05/17/2023  [facility] and [place] Number:  Chiropodist and Address:  [facility], [address] [place], [place] [address]      Provider Number: [id number]  Attending Physician Name and Address:  Sheril Dines, MD  Relative Name and Phone Number:       Current Level of Care: Hospital Recommended Level of Care: Skilled Nursing Facility Prior Approval Number:    Date Approved/Denied:   PASRR Number:    Discharge Plan: SNF    Current Diagnoses: Patient Active Problem List   Diagnosis Date Noted   AKI (acute kidney injury) (HCC) 05/20/2023   Acute on chronic heart failure with preserved ejection fraction (HFpEF) (HCC) 05/20/2023   Symptomatic anemia 05/19/2023   GI bleed 05/17/2023   Weakness 05/17/2023   Heart failure (HCC) 05/17/2023   Orthostatic hypotension 08/18/2022   Syncope 08/16/2022   Fall at home, initial encounter 08/16/2022   Iron deficiency anemia 08/16/2022   Pubic ramus fracture, left, sequela 08/16/2022   Left hip pain 08/16/2022   Diarrhea 08/16/2022   Lymphedema of right arm 08/16/2022   Primary osteoarthritis of left hip 08/16/2022   Contusion of left hip 08/16/2022   Schizophrenia (HCC)    Hypothyroidism    Anxiety and depression    Bipolar 1 disorder (HCC)    Major depressive disorder, recurrent severe without psychotic features (HCC) 04/21/2021   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HCC) 01/28/2021   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   Medical non-compliance 08/17/2020   Hepatic abscess 01/10/2016   History of diabetes mellitus, type II 11/04/2014   History of essential hypertension 11/04/2014   Intra-abdominal and pelvic swelling, mass and lump, unspecified site 11/03/2013    Orientation RESPIRATION BLADDER Height & Weight     Self,  Time, Situation, Place    Continent Weight: 46.1 kg Height:  5' (152.4 cm)  BEHAVIORAL SYMPTOMS/MOOD NEUROLOGICAL BOWEL NUTRITION STATUS      Continent Diet  AMBULATORY STATUS COMMUNICATION OF NEEDS Skin   Limited Assist Verbally                         Personal Care Assistance Level of Assistance  Bathing, Feeding, Dressing Bathing Assistance: Limited assistance Feeding assistance: Limited assistance Dressing Assistance: Limited assistance     Functional Limitations Info             SPECIAL CARE FACTORS FREQUENCY  PT (By licensed PT), OT (By licensed OT)     PT Frequency: 5 x week OT Frequency: 5 x week            Contractures      Additional Factors Info  Code Status, Allergies Code Status Info: FULL Allergies Info: Penicilins           Current Medications (05/20/2023):  This is the current hospital active medication list Current Facility-Administered Medications  Medication Dose Route Frequency Provider Last Rate Last Admin   acetaminophen  (TYLENOL ) tablet 650 mg  650 mg Oral Q6H PRN Newton, Steven J, MD   650 mg at 05/17/23 2030   lactated ringers  infusion   Intravenous Continuous Sheril Dines, MD 75 mL/hr at 05/20/23 0935 New Bag at 05/20/23 0935   levothyroxine  (SYNTHROID ) tablet 88 mcg  88 mcg Oral QAC breakfast Corrinne Din, MD   88 mcg at  05/20/23 0525   ondansetron  (ZOFRAN ) tablet 4 mg  4 mg Oral Q6H PRN Newton, Steven J, MD       Or   ondansetron  (ZOFRAN ) injection 4 mg  4 mg Intravenous Q6H PRN Newton, Steven J, MD       pantoprazole  (PROTONIX ) EC tablet 40 mg  40 mg Oral BID Nazari, Walid A, RPH   40 mg at 05/19/23 2155   potassium chloride  SA (KLOR-CON  M) CR tablet 20 mEq  20 mEq Oral Daily Sheril Dines, MD   20 mEq at 05/19/23 1652   QUEtiapine  (SEROQUEL ) tablet 25 mg  25 mg Oral QHS Corrinne Din, MD   25 mg at 05/19/23 2155   sertraline  (ZOLOFT ) tablet 100 mg  100 mg Oral QHS Corrinne Din, MD   100 mg at 05/19/23 2155    sodium chloride  flush (NS) 0.9 % injection 3 mL  3 mL Intravenous Q12H Corrinne Din, MD   3 mL at 05/20/23 1610   sodium chloride  flush (NS) 0.9 % injection 3 mL  3 mL Intravenous PRN Corrinne Din, MD         Discharge Medications: Please see discharge summary for a list of discharge medications.  Relevant Imaging Results:  Relevant Lab Results:   Additional Information [id number]  Elsie Halo, RN

## 2023-05-20 NOTE — Progress Notes (Signed)
   05/20/23 0536  Assess: MEWS Score  Temp 98.8 F (37.1 C)  BP 121/61  MAP (mmHg) 76  Pulse Rate (!) 114  Resp 18  SpO2 95 %  O2 Device Room Air  Assess: MEWS Score  MEWS Temp 0  MEWS Systolic 0  MEWS Pulse 2  MEWS RR 0  MEWS LOC 0  MEWS Score 2  MEWS Score Color Yellow  Assess: if the MEWS score is Yellow or Red  Were vital signs accurate and taken at a resting state? Yes  Does the patient meet 2 or more of the SIRS criteria? No  MEWS guidelines implemented  Yes, yellow  Treat  MEWS Interventions Considered administering scheduled or prn medications/treatments as ordered  Take Vital Signs  Increase Vital Sign Frequency  Yellow: Q2hr x1, continue Q4hrs until patient remains green for 12hrs  Escalate  MEWS: Escalate Yellow: Discuss with charge nurse and consider notifying provider and/or RRT  Notify: Charge Nurse/RN  Name of Charge Nurse/RN Notified Idella Major RN  Assess: SIRS CRITERIA  SIRS Temperature  0  SIRS Respirations  0  SIRS Pulse 1  SIRS WBC 0  SIRS Score Sum  1

## 2023-05-20 NOTE — Progress Notes (Addendum)
 Progress Note    Elizabeth [name]  ZOX:[id number] DOB: 1950-09-18  DOA: 05/17/2023 PCP: [facility]      Brief Narrative:    Medical records reviewed and are as summarized below:  Elizabeth [name] is a 73 y.o. 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    She presented to the hospital with diarrhea, shortness of breath and general weakness for about 2 weeks duration.  Reportedly, she had some dark stools.  She has also been falling at home.     Assessment/Plan:   Principal Problem:   Symptomatic anemia Active Problems:   Major depressive disorder, recurrent severe without psychotic features (HCC)   GI bleed   Weakness   Heart failure (HCC)   Hypothyroidism   History of essential hypertension   AKI (acute kidney injury) (HCC)    Body mass index is 19.86 kg/m.   Acute GI bleeding, melena: Continue IV Protonix .  Plan for colonoscopy today.   Diarrhea may possibly be related to recent COVID-19 infection Recent COVID-19 infection on 04/29/2023.   Acute blood loss anemia, history of chronic iron deficiency anemia: S/p transfusion 1 unit of PRBCs on 05/17/2023 for hemoglobin of 7.1.  Posttransfusion H&H stable.  Hemoglobin at 9.8. Hemoglobin was 8 on admission. Iron studies: Iron 47, TIBC 122, saturation ratio 39  and ferritin 830.  This essentially rules out iron deficiency anemia at this time. Vitamin B12- 625   Acute diastolic heart failure: No acute issues.  He was seen by cardiologist. S/p treatment with IV Lasix.  BNP 311.6 2D echo showed EF estimated at 55 to 60%, grade 1 diastolic dysfunction, mild MR   AKI: Creatinine up to 1.32.  Creatinine was 0.93 on admission.  Start IV "Ringer's"  lactate infusion because of diarrhea and elevated creatinine.   Hypokalemia: Improved.  Continue potassium repletion   Elevated vitamin D  level (101.94): patient was taking high-dose vitamin D  prior to admission.  This will be discontinued at discharge.   General Weakness: PT recommended discharge to SNF   Comorbidities include bipolar disorder, schizophrenia, anxiety, depression, depression, history of breast cancer s/p mastectomy in February 2023 (on letrozole ).   Plan discussed with [name], son, and family friend/significant other at the bedside.   Diet Order             Diet NPO time specified  Diet effective now                            Consultants: Gastroenterologist Cardiologist  Procedures: None    Medications:    [MAR Hold] levothyroxine   88 mcg Oral QAC breakfast   [MAR Hold] pantoprazole   40 mg Oral BID   potassium chloride   20 mEq Oral Daily   [MAR Hold] QUEtiapine   25 mg Oral QHS   [MAR Hold] sertraline   100 mg Oral QHS   [MAR Hold] sodium chloride  flush  3 mL Intravenous Q12H  Continuous Infusions:  lactated ringers  75 mL/hr at 05/20/23 0935      Anti-infectives (From admission, onward)    None              Family Communication/Anticipated D/C date and plan/Code Status   DVT prophylaxis: Place TED hose Start: 05/17/23 1454     Code Status: Full Code  Family Communication: Plan discussed with [name], son, at the bedside Disposition Plan: Plan to discharge home   Status is: Observation The patient will require care spanning > 2  midnights and should be moved to inpatient because: GI bleed, CHF, general weakness       Subjective:   Interval events noted.  She complains of watery diarrhea.  She is getting colon prep for colonoscopy today.  [name], son and a family friend at the bedside  Objective:    Vitals:   05/20/23 0520 05/20/23 0536 05/20/23 0738 05/20/23 0900  BP: (!) 117/54 121/61 (!) 110/59   Pulse: (!) 118 (!) 114 (!) 109   Resp: 16 18 18    Temp: 99.3 F (37.4 C) 98.8 F (37.1 C) 99.4 F (37.4 C)   TempSrc:      SpO2: 94% 95% 94%   Weight:    46.1 kg  Height:       No data found.  No intake or output data in the 24 hours ending 05/20/23 1303  Filed Weights   05/19/23 1300 05/20/23 0119 05/20/23 0900  Weight: 42.6 kg 44.6 kg 46.1 kg    Exam:  GEN: NAD SKIN: Warm and dry EYES: No pallor or icterus ENT: MMM CV: RRR PULM: CTA B ABD: soft, ND, NT, +BS CNS: AAO x 3, non focal EXT: No edema or tenderness        Data Reviewed:   I have personally reviewed following labs and imaging studies:  Labs: Labs show the following:   Basic Metabolic Panel: Recent Labs  Lab 05/17/23 1335 05/18/23 0319 05/18/23 0415 05/19/23 0436 05/20/23 0420  NA 141 141  --  140 135  K 3.4* 3.0*  --  3.9 4.3  CL 109 106  --  102 100  CO2 19* 24  --  26 23  GLUCOSE 124* 89  --  90 97  BUN 32* 25*  --  24* 26*  CREATININE 0.93 0.79  --  1.05* 1.32*  CALCIUM  7.9* 7.7*  --  8.0* 8.2*  MG  --  2.0  --   --  1.8  PHOS  --   --  2.9 3.3  --    GFR Estimated Creatinine Clearance: 27.7 mL/min (A) (by C-G formula based on SCr of 1.32 mg/dL (H)). Liver Function Tests: Recent Labs  Lab 05/17/23 1335 05/18/23 0319 05/19/23 0436 05/20/23 0420  AST 91* 84*  --  152*  ALT 27 24  --  28  ALKPHOS 281* 258*  --  297*  BILITOT 0.6 0.8  --  0.9  PROT 6.4* 6.2*  --  6.5  ALBUMIN  2.2* 2.0* 2.0* 2.2*   Recent Labs  Lab 05/17/23 1335  LIPASE 24   No results for input(s): "AMMONIA" in the last  168 hours. Coagulation profile No results for input(s): "INR", "PROTIME" in the last 168 hours.  CBC: Recent Labs  Lab 05/17/23 1335 05/17/23 1639 05/17/23 2251 05/18/23 0319 05/18/23 0908 05/19/23 0435 05/20/23 0420  WBC 7.6  --   --   --   --  6.3 9.0  NEUTROABS  --   --   --   --   --   --  7.7  HGB 8.0*   < > 8.6* 8.7* 10.0* 9.6* 9.8*  HCT 25.3*   < > 25.2* 25.8* 29.2* 28.4* 29.2*  MCV 88.2  --   --   --   --  84.3 85.4  PLT 411*  --   --   --   --  391 402*   < > = values in this interval not displayed.   Cardiac Enzymes: No results for input(s): "CKTOTAL", "CKMB", "CKMBINDEX", "TROPONINI" in the last 168 hours. BNP (last 3 results) No results for input(s): "PROBNP" in the last 8760 hours. CBG: No results for input(s): "GLUCAP" in the last 168 hours. D-Dimer: No results for input(s): "DDIMER" in the last 72 hours. Hgb A1c: No results for input(s): "HGBA1C" in the last 72 hours. Lipid Profile: No results for input(s): "CHOL", "HDL", "LDLCALC", "TRIG", "CHOLHDL", "LDLDIRECT" in the last 72 hours. Thyroid function studies: Recent Labs    05/17/23 1639  TSH 5.246*   Anemia work up: Recent Labs    05/18/23 0415 05/19/23 0435  VITAMINB12  --  625  FERRITIN 830*  --   TIBC 122*  --   IRON 47  --    Sepsis Labs: Recent Labs  Lab 05/17/23 1335 05/19/23 0435 05/20/23 0420  WBC 7.6 6.3 9.0    Microbiology Recent Results (from the past 240 hours)  C Difficile Quick Screen w PCR reflex     Status: None   Collection Time: 05/18/23  8:12 AM   Specimen: STOOL  Result Value Ref Range Status   C Diff antigen NEGATIVE NEGATIVE Final   C Diff toxin NEGATIVE NEGATIVE Final   C Diff interpretation No C. difficile detected.  Final    Comment: Performed at [facility], [address] [place], [place] [address]  Gastrointestinal Panel by PCR , Stool     Status: None   Collection Time: 05/18/23 11:00 AM   Specimen: Stool  Result Value Ref Range Status    Campylobacter species NOT DETECTED NOT DETECTED Final   Plesimonas shigelloides NOT DETECTED NOT DETECTED Final   Salmonella species NOT DETECTED NOT DETECTED Final   Yersinia enterocolitica NOT DETECTED NOT DETECTED Final   Vibrio species NOT DETECTED NOT DETECTED Final   Vibrio cholerae NOT DETECTED NOT DETECTED Final   Enteroaggregative E coli (EAEC) NOT DETECTED NOT DETECTED Final   Enteropathogenic E coli (EPEC) NOT DETECTED NOT DETECTED Final   Enterotoxigenic E coli (ETEC) NOT DETECTED NOT DETECTED Final   Shiga like toxin producing E coli (STEC) NOT DETECTED NOT DETECTED Final   Shigella/Enteroinvasive E coli (EIEC) NOT DETECTED NOT DETECTED Final   Cryptosporidium NOT DETECTED NOT DETECTED Final   Cyclospora cayetanensis NOT DETECTED NOT DETECTED Final   Entamoeba histolytica NOT DETECTED NOT DETECTED Final   Giardia lamblia NOT DETECTED NOT DETECTED Final   Adenovirus F40/41 NOT DETECTED NOT DETECTED Final   Astrovirus NOT DETECTED NOT DETECTED Final   Norovirus GI/GII NOT DETECTED NOT DETECTED Final   Rotavirus A NOT DETECTED NOT DETECTED Final   Sapovirus (I, II, IV, and V) NOT DETECTED NOT DETECTED Final    Comment: Performed at [facility], [address] [place], [place] [address]    Procedures and diagnostic studies:  ECHOCARDIOGRAM COMPLETE Result Date: 05/19/2023    ECHOCARDIOGRAM REPORT   Patient Name:   PENDA COBLENTZ [facility] Date of Exam: 05/18/2023 Medical Rec #:  [id number]           Height:  60.0 in Accession #:    [id number]          Weight:       92.4 lb Date of Birth:  1950/05/26          BSA:          1.344 m Patient Age:    72 years            BP:           0/0 mmHg Patient Gender: F                   HR:           90 bpm. Exam Location:  [facility] Procedure: 2D Echo, Cardiac Doppler, Color Doppler and Strain Analysis (Both            Spectral and Color Flow Doppler were utilized during procedure). Indications:     CHF- Acute Diastolic I50.31  History:          Patient has no prior history of Echocardiogram examinations.  Sonographer:     Jane Meager RDCS Referring Phys:  [id number] Alayne Allis NEWTON Diagnosing Phys: Antionette Kirks MD  Sonographer Comments: Image acquisition challenging due to mastectomy and Image acquisition challenging due to respiratory motion. IMPRESSIONS  1. Left ventricular ejection fraction, by estimation, is 55 to 60%. The left ventricle has normal function. The left ventricle has no regional wall motion abnormalities. Left ventricular diastolic parameters are consistent with Grade I diastolic dysfunction (impaired relaxation). The average left ventricular global longitudinal strain is -17.4 %. The global longitudinal strain is normal.  2. Right ventricular systolic function is normal. The right ventricular size is normal. There is normal pulmonary artery systolic pressure. The estimated right ventricular systolic pressure is 23.4 mmHg.  3. The mitral valve is normal in structure. Mild mitral valve regurgitation. No evidence of mitral stenosis.  4. The aortic valve is normal in structure. Aortic valve regurgitation is mild. Aortic valve sclerosis is present, with no evidence of aortic valve stenosis.  5. The inferior vena cava is normal in size with greater than 50% respiratory variability, suggesting right atrial pressure of 3 mmHg. FINDINGS  Left Ventricle: Left ventricular ejection fraction, by estimation, is 55 to 60%. The left ventricle has normal function. The left ventricle has no regional wall motion abnormalities. The average left ventricular global longitudinal strain is -17.4 %. Strain was performed and the global longitudinal strain is normal. The left ventricular internal cavity size was normal in size. There is borderline left ventricular hypertrophy. Left ventricular diastolic parameters are consistent with Grade I diastolic  dysfunction (impaired relaxation). Right Ventricle: The right ventricular size is normal. No increase in right  ventricular wall thickness. Right ventricular systolic function is normal. There is normal pulmonary artery systolic pressure. The tricuspid regurgitant velocity is 2.26 m/s, and  with an assumed right atrial pressure of 3 mmHg, the estimated right ventricular systolic pressure is 23.4 mmHg. Left Atrium: Left atrial size was normal in size. Right Atrium: Right atrial size was normal in size. Pericardium: There is no evidence of pericardial effusion. Mitral Valve: The mitral valve is normal in structure. Mild mitral valve regurgitation. No evidence of mitral valve stenosis. Tricuspid Valve: The tricuspid valve is normal in structure. Tricuspid valve regurgitation is mild . No evidence of tricuspid stenosis. Aortic Valve: The aortic valve is normal in structure. Aortic valve regurgitation is mild. Aortic regurgitation PHT measures 479 msec. Aortic valve sclerosis is present, with no evidence  of aortic valve stenosis. Aortic valve peak gradient measures 10.2 mmHg. Pulmonic Valve: The pulmonic valve was normal in structure. Pulmonic valve regurgitation is not visualized. No evidence of pulmonic stenosis. Aorta: The aortic root is normal in size and structure. Venous: The inferior vena cava is normal in size with greater than 50% respiratory variability, suggesting right atrial pressure of 3 mmHg. IAS/Shunts: No atrial level shunt detected by color flow Doppler.  LEFT VENTRICLE PLAX 2D LVIDd:         4.20 cm   Diastology LVIDs:         2.90 cm   LV e' medial:    10.20 cm/s LV PW:         0.90 cm   LV E/e' medial:  8.1 LV IVS:        1.10 cm   LV e' lateral:   10.20 cm/s LVOT diam:     2.00 cm   LV E/e' lateral: 8.1 LV SV:         77 LV SV Index:   57        2D Longitudinal Strain LVOT Area:     3.14 cm  2D Strain GLS Avg:     -17.4 %  RIGHT VENTRICLE RV Basal diam:  3.00 cm RV S prime:     14.70 cm/s TAPSE (M-mode): 2.1 cm LEFT ATRIUM             Index        RIGHT ATRIUM           Index LA diam:        4.30 cm 3.20  cm/m   RA Area:     14.10 cm LA Vol (A2C):   29.0 ml 21.57 ml/m  RA Volume:   32.90 ml  24.48 ml/m LA Vol (A4C):   32.5 ml 24.18 ml/m LA Biplane Vol: 31.0 ml 23.06 ml/m  AORTIC VALVE                 PULMONIC VALVE AV Area (Vmax): 2.73 cm     PV Vmax:        1.14 m/s AV Vmax:        160.00 cm/s  PV Peak grad:   5.2 mmHg AV Peak Grad:   10.2 mmHg    RVOT Peak grad: 3 mmHg LVOT Vmax:      139.00 cm/s LVOT Vmean:     95.200 cm/s LVOT VTI:       0.244 m AI PHT:         479 msec  AORTA Ao Root diam: 2.70 cm Ao Asc diam:  3.10 cm MITRAL VALVE                TRICUSPID VALVE MV Area (PHT): 4.71 cm     TR Peak grad:   20.4 mmHg MV Decel Time: 161 msec     TR Vmax:        226.00 cm/s MV E velocity: 82.30 cm/s MV A velocity: 105.00 cm/s  SHUNTS MV E/A ratio:  0.78         Systemic VTI:  0.24 m                             Systemic Diam: 2.00 cm Antionette Kirks MD Electronically signed by Antionette Kirks MD Signature Date/Time: 05/19/2023/11:01:28 AM    Final  LOS: 0 days   Ruchi Stoney  [facility]   Pager on [email or web address]. If 7PM-7AM, please contact night-coverage at [email or web address]     05/20/2023, 1:03 PM

## 2023-05-20 NOTE — Anesthesia Procedure Notes (Signed)
 Procedure Name: General with mask airway Date/Time: 05/20/2023 11:35 AM  Performed by: Niki Barter, CRNAPre-anesthesia Checklist: Patient identified, Emergency Drugs available, Suction available and Patient being monitored Patient Re-evaluated:Patient Re-evaluated prior to induction Oxygen Delivery Method: Simple face mask Induction Type: IV induction Placement Confirmation: positive ETCO2 and breath sounds checked- equal and bilateral Dental Injury: Teeth and Oropharynx as per pre-operative assessment

## 2023-05-20 NOTE — Transfer of Care (Signed)
 Immediate Anesthesia Transfer of Care Note  Patient: Elizabeth [name]  Procedure(s) Performed: COLONOSCOPY  Patient Location: Endoscopy Unit  Anesthesia Type:General  Level of Consciousness: drowsy and patient cooperative  Airway & Oxygen Therapy: Patient Spontanous Breathing and Patient connected to face mask oxygen  Post-op Assessment: Report given to RN and Post -op Vital signs reviewed and stable  Post vital signs: Reviewed and stable  Last Vitals:  Vitals Value Taken Time  BP    Temp    Pulse 91 05/20/23 1355  Resp 22 05/20/23 1355  SpO2 95 % 05/20/23 1355  Vitals shown include unfiled device data.  Last Pain:  Vitals:   05/20/23 1304  TempSrc: Temporal  PainSc: 0-No pain      Patients Stated Pain Goal: 0 (05/17/23 2000)  Complications: No notable events documented.

## 2023-05-20 NOTE — Anesthesia Preprocedure Evaluation (Addendum)
 Anesthesia Evaluation  Patient identified by MRN, date of birth, ID band Patient awake    Reviewed: Allergy & Precautions, H&P , NPO status , Patient's Chart, lab work & pertinent test results  Airway Mallampati: II  TM Distance: >3 FB Neck ROM: full    Dental  (+) Poor Dentition   Pulmonary shortness of breath and with exertion, former smoker  diagnosis of COVID-19 infection on 04/29/2023   Pulmonary exam normal        Cardiovascular negative cardio ROS Normal cardiovascular exam   ECHO 4/25:  1. Left ventricular ejection fraction, by estimation, is 55 to 60%. The  left ventricle has normal function. The left ventricle has no regional  wall motion abnormalities. Left ventricular diastolic parameters are  consistent with Grade I diastolic  dysfunction (impaired relaxation). The average left ventricular global  longitudinal strain is -17.4 %. The global longitudinal strain is normal.   2. Right ventricular systolic function is normal. The right ventricular  size is normal. There is normal pulmonary artery systolic pressure. The  estimated right ventricular systolic pressure is 23.4 mmHg.   3. The mitral valve is normal in structure. Mild mitral valve  regurgitation. No evidence of mitral stenosis.   4. The aortic valve is normal in structure. Aortic valve regurgitation is  mild. Aortic valve sclerosis is present, with no evidence of aortic valve  stenosis.   5. The inferior vena cava is normal in size with greater than 50%  respiratory variability, suggesting right atrial pressure of 3 mmHg.     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   Endo/Other  Hypothyroidism    Renal/GU Renal disease (AKI)  negative genitourinary   Musculoskeletal  (+) Arthritis ,  Fibromyalgia -  Abdominal Normal abdominal exam  (+)   Peds  Hematology  (+) Blood dyscrasia, anemia  S/p transfusion 1 unit of PRBCs on 05/17/2023 for hemoglobin of 7.1.    Anesthesia Other Findings Per cardiology: Acute on chronic diastolic heart failure - In the setting of significant blood loss anemia - Echo showed normal EF with G1DD, normal pulmonary pressure and IVC normal size and reactive - Appears euvolemic on exam - Kidney function worsening, diuretic held - Overall stable from a cardiac standpoint   GI bleed Anemia - Hgb 9.8 - GI plan for colonoscopy today - Ongoing management per GI   Atypical chest pain - Consider outpatient ischemic evaluation with Lexiscan MPI - Will arrange follow up   Past Medical History: 2016: Acute cholecystitis No date: Anemia 2016: Anemia No date: Anxiety     Comment:  panic attacks No date: Anxiety and depression No date: Arthritis No date: Bipolar 1 disorder (HCC) 2017: Bowel obstruction (HCC) 2015: Common bile duct dilatation No date: Dementia (HCC) No date: Dyspnea No date: Edema No date: Fibromyalgia 2017: GI bleed 01/21/2009: History of kidney stones 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 No date: Hyperglycemia No date: Hyperlipidemia No date: Hypokalemia No date: Hypothyroidism No date: Liver cyst No date: Malignant neoplasm of right female breast, unspecified  estrogen receptor status, unspecified site of breast (HCC)  No date: Memory loss No date: Osteopenia No date: Panic disorder No date: Schizophrenia (HCC) No date: Ventral hernia  Past Surgical History: No date: ABDOMINAL HYSTERECTOMY No date:  APPENDECTOMY 01/19/2021: BREAST BIOPSY; Right     Comment:  US  Bx, ribbon, path pending 01/19/2021: BREAST BIOPSY; Right     Comment:  US  Bx, Venus, path pending 01/19/2021: BREAST BIOPSY; Right     Comment:  US  Bx, heart, path pending 01/19/2021: BREAST BIOPSY; Right     Comment:  US  BX Axilla, Coil, path pending No date: CESAREAN SECTION 11/17/2013: CHOLECYSTECTOMY OPEN     Comment:  with repair of bile duct-Dr. Adele Admire 03/02/2021: MASTECTOMY MODIFIED RADICAL; Right     Comment:  Procedure: MASTECTOMY MODIFIED RADICAL;  Surgeon: Conrado Delay, DO;  Location: [facility] ORS;  Service: General;                Laterality: Right; 12/20/2014: OPEN REDUCTION INTERNAL FIXATION (ORIF) DISTAL RADIAL  FRACTURE; Right     Comment:  Procedure: OPEN REDUCTION INTERNAL FIXATION (ORIF) RIGHT              DISTAL RADIAL FRACTURE;  Surgeon: Saundra Curl, MD;                Location: [facility];  Service:               Orthopedics;  Laterality: Right; No date: STOMACH SURGERY  BMI    Body Mass Index: 19.86 kg/m      Reproductive/Obstetrics negative OB ROS                             Anesthesia Physical Anesthesia Plan  ASA: 3  Anesthesia Plan: General   Post-op Pain Management: Minimal or no pain anticipated   Induction: Intravenous  PONV Risk Score and Plan: Propofol  infusion and TIVA  Airway Management Planned: Natural Airway  Additional Equipment:   Intra-op Plan:   Post-operative Plan: Extubation in OR  Informed Consent: I have reviewed the patients History and Physical, chart, labs and discussed the procedure including the risks, benefits and alternatives for the proposed anesthesia with the patient or authorized representative who has indicated his/her understanding and acceptance.     Dental Advisory Given and Consent reviewed with POA  Plan Discussed with: CRNA and Surgeon  Anesthesia Plan Comments:          Anesthesia Quick Evaluation

## 2023-05-20 NOTE — TOC Progression Note (Signed)
 Transition of Care [facility]) - Progression Note    Patient Details  Name: Elizabeth [name] MRN: [id number] Date of Birth: 05/27/1950  [facility] CM/SW Contact  Elsie Halo, RN Phone Number: 05/20/2023, 3:32 PM  Clinical Narrative:    TOC spoke with the patient's son [name] [phone] and shared that our therapist evaluated the patient and recommend SNF. [name] shared that his mother's health has declined since she had COVID and he feels that she would benefit from SNF.  He plans to discuss the possibility of STR with his mother and f/u.   TOC will continue to follow.     Expected Discharge Plan: Home w Home Health Services    Expected Discharge Plan and Services   Discharge Planning Services: CM Consult   Living arrangements for the past 2 months: Single Family Home                                       Social Determinants of Health (SDOH) Interventions SDOH Screenings   Food Insecurity: No Food Insecurity (05/17/2023)  Housing: Low Risk  (05/17/2023)  Transportation Needs: No Transportation Needs (05/17/2023)  Utilities: Not At Risk (05/17/2023)  Depression (PHQ2-9): High Risk (07/22/2022)  Financial Resource Strain: Low Risk  (08/20/2022)   Received from [facility]  Physical Activity: Insufficiently Active (08/06/2021)   Received [facility], [facility]  Social Connections: Socially Isolated (05/17/2023)  Stress: No Stress Concern Present (08/27/2021)   Received from [facility], [facility]  Tobacco Use: Medium Risk (05/17/2023)    Readmission Risk Interventions     No data to display

## 2023-05-20 NOTE — Anesthesia Postprocedure Evaluation (Signed)
 Anesthesia Post Note  Patient: Elizabeth [name]  Procedure(s) Performed: COLONOSCOPY  Patient location during evaluation: Endoscopy Anesthesia Type: General Level of consciousness: awake and alert Pain management: pain level controlled Vital Signs Assessment: post-procedure vital signs reviewed and stable Respiratory status: spontaneous breathing, nonlabored ventilation, respiratory function stable and patient connected to nasal cannula oxygen Cardiovascular status: blood pressure returned to baseline and stable Postop Assessment: no apparent nausea or vomiting Anesthetic complications: no   No notable events documented.   Last Vitals:  Vitals:   05/20/23 1402 05/20/23 1412  BP: (!) 112/52 (!) 110/46  Pulse: 94 89  Resp: (!) 29 (!) 24  Temp:    SpO2: 95% 95%    Last Pain:  Vitals:   05/20/23 1412  TempSrc:   PainSc: 0-No pain                 Vanice Genre

## 2023-05-20 NOTE — Progress Notes (Signed)
 Heart Failure Navigator Progress Note  Assessed for Heart & Vascular TOC clinic readiness.  Unfortunately patient does not meet criteria due to current dementia diagnosis.  Navigator available for reassessment of patient but will sign off at this time.  Celedonio Coil, RN, BSN [facility] Heart Failure Navigator Secure Chat Only

## 2023-05-20 NOTE — Op Note (Signed)
 [facility]enter[facility] Patient Name: Elizabeth [name] Procedure Date: 05/20/2023 1:24 PM MRN: [id number] Account #: [id number] Date of Birth: 15-Aug-1950 Admit Type: Outpatient Age: 73 Room: [facility] ENDO ROOM 4 Gender: Female Note Status: Finalized Instrument Name: Charlyn Cooley [id number] Procedure:             Colonoscopy Indications:           Iron deficiency anemia Providers:             [name] MD, MD Medicines:             Propofol  per Anesthesia Complications:         No immediate complications. Procedure:             Pre-Anesthesia Assessment:                        - Prior to the procedure, a History and Physical was                         performed, and patient medications and allergies were                         reviewed. The patient's tolerance of previous                         anesthesia was also reviewed. The risks and benefits                         of the procedure and the sedation options and risks                         were discussed with the patient. All questions were                         answered, and informed consent was obtained. Prior                         Anticoagulants: The patient has taken no anticoagulant                         or antiplatelet agents. ASA Grade Assessment: II - A                         patient with mild systemic disease. After reviewing                         the risks and benefits, the patient was deemed in                         satisfactory condition to undergo the procedure.                        After obtaining informed consent, the colonoscope was                         passed under direct vision. Throughout the procedure,                         the patient's blood pressure, pulse,  and oxygen                         saturations were monitored continuously. The                         Colonoscope was introduced through the anus and                         advanced to the the cecum,  identified by appendiceal                         orifice and ileocecal valve. The colonoscopy was                         performed without difficulty. The patient tolerated                         the procedure well. Findings:      The perianal and digital rectal examinations were normal.      Non-bleeding internal hemorrhoids were found during retroflexion. The       hemorrhoids were Grade I (internal hemorrhoids that do not prolapse).      There was evidence of a prior end-to-side ileo-colonic anastomosis in       the transverse colon. This was patent. Impression:            - Non-bleeding internal hemorrhoids.                        - Patent end-to-side ileo-colonic anastomosis.                        - No specimens collected. Recommendation:        - Return patient to hospital ward for ongoing care.                        - Resume regular diet.                        - Continue present medications. Procedure Code(s):     --- Professional ---                        [phone], Colonoscopy, flexible; diagnostic, including                         collection of specimen(s) by brushing or washing, when                         performed (separate procedure) Diagnosis Code(s):     --- Professional ---                        D50.9, Iron deficiency anemia, unspecified CPT copyright 2022 American Medical Association. All rights reserved. The codes documented in this report are preliminary and upon coder review may  be revised to meet current compliance requirements. [name] MD, MD 05/20/2023 1:52:38 PM This report has been signed electronically. Number of Addenda: 0 Note Initiated On: 05/20/2023 1:24 PM Scope Withdrawal Time: 0 hours 7 minutes 22 seconds  Total Procedure Duration: 0 hours 13 minutes 5 seconds  Estimated Blood Loss:  Estimated blood loss: none.      [facility]

## 2023-05-20 NOTE — Progress Notes (Signed)
 Rounding Note    Patient Name: Elizabeth [name] Date of Encounter: 05/20/2023  [facility] Cardiologist: Antionette Kirks, MD   Subjective   Patient endorses ongoing fatigue and atypical chest pain in the left lower chest/upper abdomen. Hgb 9.8. Plan for colonoscopy today per GI.   Inpatient Medications    Scheduled Meds:  levothyroxine   88 mcg Oral QAC breakfast   pantoprazole   40 mg Oral BID   potassium chloride   20 mEq Oral Daily   QUEtiapine   25 mg Oral QHS   sertraline   100 mg Oral QHS   sodium chloride  flush  3 mL Intravenous Q12H   Continuous Infusions:  lactated ringers  75 mL/hr at 05/20/23 0935   PRN Meds: acetaminophen , ondansetron  **OR** ondansetron  (ZOFRAN ) IV, sodium chloride  flush   Vital Signs    Vitals:   05/20/23 0520 05/20/23 0536 05/20/23 0738 05/20/23 0900  BP: (!) 117/54 121/61 (!) 110/59   Pulse: (!) 118 (!) 114 (!) 109   Resp: 16 18 18    Temp: 99.3 F (37.4 C) 98.8 F (37.1 C) 99.4 F (37.4 C)   TempSrc:      SpO2: 94% 95% 94%   Weight:    46.1 kg  Height:       No intake or output data in the 24 hours ending 05/20/23 1038    05/20/2023    9:00 AM 05/20/2023    1:19 AM 05/19/2023    1:00 PM  Last 3 Weights  Weight (lbs) 101 lb 11.2 oz 98 lb 5.2 oz 94 lb  Weight (kg) 46.131 kg 44.6 kg 42.638 kg      Physical Exam   GEN: No acute distress.   Neck: No JVD Cardiac: RRR, II/VI systolic murmur, rubs, or gallops.  Respiratory: Clear to auscultation bilaterally. GI: Soft, nontender, non-distended  MS: No edema; No deformity. Neuro:  Nonfocal  Psych: Normal affect   Labs    High Sensitivity Troponin:   Recent Labs  Lab 05/17/23 1335  TROPONINIHS 21*     Chemistry Recent Labs  Lab 05/17/23 1335 05/18/23 0319 05/19/23 0436 05/20/23 0420  NA 141 141 140 135  K 3.4* 3.0* 3.9 4.3  CL 109 106 102 100  CO2 19* 24 26 23   GLUCOSE 124* 89 90 97  BUN 32* 25* 24* 26*  CREATININE 0.93 0.79 1.05* 1.32*  CALCIUM  7.9*  7.7* 8.0* 8.2*  MG  --  2.0  --  1.8  PROT 6.4* 6.2*  --  6.5  ALBUMIN  2.2* 2.0* 2.0* 2.2*  AST 91* 84*  --  152*  ALT 27 24  --  28  ALKPHOS 281* 258*  --  297*  BILITOT 0.6 0.8  --  0.9  GFRNONAA >60 >60 56* 43*  ANIONGAP 13 11 12 12     Lipids No results for input(s): "CHOL", "TRIG", "HDL", "LABVLDL", "LDLCALC", "CHOLHDL" in the last 168 hours.  Hematology Recent Labs  Lab 05/17/23 1335 05/17/23 1639 05/18/23 0908 05/19/23 0435 05/20/23 0420  WBC 7.6  --   --  6.3 9.0  RBC 2.87*  --   --  3.37* 3.42*  HGB 8.0*   < > 10.0* 9.6* 9.8*  HCT 25.3*   < > 29.2* 28.4* 29.2*  MCV 88.2  --   --  84.3 85.4  MCH 27.9  --   --  28.5 28.7  MCHC 31.6  --   --  33.8 33.6  RDW 21.3*  --   --  20.0*  20.2*  PLT 411*  --   --  391 402*   < > = values in this interval not displayed.   Thyroid  Recent Labs  Lab 05/17/23 1639  TSH 5.246*    BNP Recent Labs  Lab 05/17/23 1639  BNP 311.6*    DDimer No results for input(s): "DDIMER" in the last 168 hours.   Cardiac Studies   05/18/2023 Echo complete 1. Left ventricular ejection fraction, by estimation, is 55 to 60%. The  left ventricle has normal function. The left ventricle has no regional  wall motion abnormalities. Left ventricular diastolic parameters are  consistent with Grade I diastolic  dysfunction (impaired relaxation). The average left ventricular global  longitudinal strain is -17.4 %. The global longitudinal strain is normal.   2. Right ventricular systolic function is normal. The right ventricular  size is normal. There is normal pulmonary artery systolic pressure. The  estimated right ventricular systolic pressure is 23.4 mmHg.   3. The mitral valve is normal in structure. Mild mitral valve  regurgitation. No evidence of mitral stenosis.   4. The aortic valve is normal in structure. Aortic valve regurgitation is  mild. Aortic valve sclerosis is present, with no evidence of aortic valve  stenosis.   5. The inferior  vena cava is normal in size with greater than 50%  respiratory variability, suggesting right atrial pressure of 3 mmHg.   Patient Profile     73 y.o. female with a hx of mild dementia, chronic anemia, bipolar disorder and previous history of breast cancer who was admitted 4/26 with weakness and GI bleeding. Cardiology was asked to consult for acute on chronic diastolic heart failure.   Assessment & Plan    Acute on chronic diastolic heart failure - In the setting of significant blood loss anemia - Echo showed normal EF with G1DD, normal pulmonary pressure and IVC normal size and reactive - Appears euvolemic on exam - Kidney function worsening, diuretic held - Overall stable from a cardiac standpoint  GI bleed Anemia - Hgb 9.8 - GI plan for colonoscopy today - Ongoing management per GI  Atypical chest pain - Consider outpatient ischemic evaluation with Lexiscan MPI - Will arrange follow up  For questions or updates, please contact [facility] Please consult [email or web address] for contact info under        Signed, Brodie Cannon, PA-C  05/20/2023, 10:38 AM

## 2023-05-20 NOTE — Plan of Care (Signed)
  Problem: Education: Goal: Knowledge of General Education information will improve Description: Including pain rating scale, medication(s)/side effects and non-pharmacologic comfort measures Outcome: Progressing   Problem: Health Behavior/Discharge Planning: Goal: Ability to manage health-related needs will improve Outcome: Progressing   Problem: Activity: Goal: Risk for activity intolerance will decrease Outcome: Progressing   Problem: Coping: Goal: Level of anxiety will decrease Outcome: Progressing   Problem: Elimination: Goal: Will not experience complications related to bowel motility Outcome: Progressing   Problem: Pain Managment: Goal: General experience of comfort will improve and/or be controlled Outcome: Progressing   Problem: Safety: Goal: Ability to remain free from injury will improve Outcome: Progressing   Problem: Skin Integrity: Goal: Risk for impaired skin integrity will decrease Outcome: Progressing

## 2023-05-21 DIAGNOSIS — K921 Melena: Secondary | ICD-10-CM

## 2023-05-21 DIAGNOSIS — D649 Anemia, unspecified: Secondary | ICD-10-CM | POA: Diagnosis not present

## 2023-05-21 LAB — CBC WITH DIFFERENTIAL/PLATELET
Abs Immature Granulocytes: 0.14 10*3/uL — ABNORMAL HIGH (ref 0.00–0.07)
Basophils Absolute: 0 10*3/uL (ref 0.0–0.1)
Basophils Relative: 1 %
Eosinophils Absolute: 0.2 10*3/uL (ref 0.0–0.5)
Eosinophils Relative: 3 %
HCT: 27 % — ABNORMAL LOW (ref 36.0–46.0)
Hemoglobin: 8.9 g/dL — ABNORMAL LOW (ref 12.0–15.0)
Immature Granulocytes: 2 %
Lymphocytes Relative: 11 %
Lymphs Abs: 0.8 10*3/uL (ref 0.7–4.0)
MCH: 29 pg (ref 26.0–34.0)
MCHC: 33 g/dL (ref 30.0–36.0)
MCV: 87.9 fL (ref 80.0–100.0)
Monocytes Absolute: 0.4 10*3/uL (ref 0.1–1.0)
Monocytes Relative: 5 %
Neutro Abs: 5.7 10*3/uL (ref 1.7–7.7)
Neutrophils Relative %: 78 %
Platelets: 343 10*3/uL (ref 150–400)
RBC: 3.07 MIL/uL — ABNORMAL LOW (ref 3.87–5.11)
RDW: 20.8 % — ABNORMAL HIGH (ref 11.5–15.5)
WBC: 7.3 10*3/uL (ref 4.0–10.5)
nRBC: 0.3 % — ABNORMAL HIGH (ref 0.0–0.2)

## 2023-05-21 LAB — VITAMIN A: Vitamin A (Retinoic Acid): 4.5 ug/dL — ABNORMAL LOW (ref 22.0–69.5)

## 2023-05-21 LAB — BASIC METABOLIC PANEL WITH GFR
Anion gap: 9 (ref 5–15)
BUN: 22 mg/dL (ref 8–23)
CO2: 24 mmol/L (ref 22–32)
Calcium: 8.1 mg/dL — ABNORMAL LOW (ref 8.9–10.3)
Chloride: 108 mmol/L (ref 98–111)
Creatinine, Ser: 1.06 mg/dL — ABNORMAL HIGH (ref 0.44–1.00)
GFR, Estimated: 56 mL/min — ABNORMAL LOW (ref >60)
Glucose, Bld: 91 mg/dL (ref 70–99)
Potassium: 4.3 mmol/L (ref 3.5–5.1)
Sodium: 141 mmol/L (ref 135–145)

## 2023-05-21 LAB — PHOSPHORUS: Phosphorus: 2.8 mg/dL (ref 2.5–4.6)

## 2023-05-21 LAB — VITAMIN E
Vitamin E (Alpha Tocopherol): 14.9 mg/L (ref 9.0–29.0)
Vitamin E(Gamma Tocopherol): 2.4 mg/L (ref 0.5–4.9)

## 2023-05-21 LAB — MAGNESIUM: Magnesium: 2 mg/dL (ref 1.7–2.4)

## 2023-05-21 LAB — VITAMIN B1: Vitamin B1 (Thiamine): 82.2 nmol/L (ref 66.5–200.0)

## 2023-05-21 LAB — ZINC: Zinc: 44 ug/dL (ref 44–115)

## 2023-05-21 MED ORDER — LEVOTHYROXINE SODIUM 100 MCG PO TABS
100.0000 ug | ORAL_TABLET | Freq: Every day | ORAL | Status: DC
  Administered 2023-05-22 – 2023-05-30 (×9): 100 ug via ORAL
  Filled 2023-05-21 (×9): qty 1

## 2023-05-21 NOTE — Progress Notes (Signed)
 PROGRESS NOTE    Elizabeth [name]  ZOX:[id number] DOB: [birth year] DOA: 05/17/2023 PCP: [facility]  Chief Complaint  Patient presents with   Fall   Diarrhea    Hospital Course:  Elizabeth [name] is a 73 year old female with hypothyroidism, GI bleeds, bipolar disorder, schizophrenia, panic disorder, depression, anxiety, anemia, fibromyalgia, breast cancer status post right mastectomy, right-sided lymphedema, heart failure with preserved EF, recent diagnosis of COVID-19 on 4/8 and extensive surgical history including duodenectomy, reimplantation of ampulla, gastrojejunostomy, right hemicolectomy with 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 On this admission she presents with diarrhea, dyspnea, generalized weakness for 2 weeks.  She also endorses dark stools and frequent falls at home.  Patient was found to be anemic to hemoglobin of 7.1.  She received 1 unit PRBC.  She underwent colonoscopy on 4/29 which revealed nonbleeding internal hemorrhoids and patent end-to-side ileo-colonic anastomosis  subjective: No acute events overnight.  Patient endorses some generalized abdominal pain. She denies bloody bowel movements this morning.  Reports that she tolerated breakfast without issue.    Objective: Vitals:   05/21/23 0412 05/21/23 0416 05/21/23 0446 05/21/23 0734  BP: 132/72 132/72  122/72  Pulse: (!) 101   (!) 106  Resp: 18 18  16   Temp: 98.3 F (36.8 C) 98.3 F (36.8 C)  98.4 F (36.9 C)  TempSrc:      SpO2: 92% 92%  94%  Weight:   48.4 kg   Height:        Intake/Output Summary (Last 24 hours) at 05/21/2023 0942 Last data filed at 05/20/2023 1500 Gross per 24 hour  Intake 632.34 ml  Output --  Net 632.34 ml   Filed Weights   05/20/23 0119 05/20/23 0900 05/21/23 0446  Weight: 44.6 kg 46.1 kg 48.4 kg    Examination: General exam: Appears calm and comfortable, NAD   Respiratory system: No work of breathing, symmetric chest wall expansion Cardiovascular system: S1 & S2 heard, RRR.  Gastrointestinal system: Abdomen is nondistended, soft, diffusely tender to palpation Neuro: Alert and oriented. No focal neurological deficits. Extremities: Symmetric, expected ROM Skin: No rashes, lesions Psychiatry: Demonstrates appropriate judgement and insight. Mood & affect appropriate for situation.   Assessment & Plan:  Principal Problem:   Symptomatic anemia Active Problems:   Major depressive disorder, recurrent severe without psychotic features (HCC)   GI bleed   Weakness   Heart failure (HCC)   Hypothyroidism   History of essential hypertension   AKI (acute kidney injury) (HCC)   Acute on chronic heart failure with preserved ejection fraction (HFpEF) (HCC)    Acute blood loss anemia History of chronic iron deficiency anemia - Status post 1 unit PRBC on 4/26 for hemoglobin 7.1 - Continue monitor hemoglobin, downtrending again - Iron studies: Iron 47, TIBC 122, sat ratio 39, ferritin 830.  No evidence of iron deficiency at this time - B12: 625 - Colonoscopy mostly unremarkable 4/29.  Reportedly patient refused EGD initially but is now reconsidering. - Gastroenterology still following, appreciate recommendations  AKI - Likely prerenal given diarrhea - Baseline 0.9, up to 1.32.  Resolving now. - Continue with maintenance IV fluids - Continue to trend CMP - Avoid nephrotoxic meds  Hypokalemia - Replace as needed  Elevated vitamin D  - Patient was taking high-dose vitamin D  prior to admission - Discontinue at discharge  Generalized weakness - PT recommending discharge to SNF  Bipolar disorder Schizophrenia Anxiety Depression -  Resume home meds - Frequent reorientation  History of breast cancer status post mastectomy - Mastectomy February 2023 - On letrozole  at home - Chronic lymphedema.  Acute on chronic diastolic heart failure -  Clinically euvolemic - Echo with normal EF, grade 1 diastolic dysfunction, normal pulmonary pressures. - Cardiology evaluated patient during this admission for clearance for colonoscopy. - Resume home meds, GDMT as BP tolerates.  Hypothyroidism - TSH 5.26 - Takes 88 mcg levothyroxine  at home, will increase to 100 - Needs outpatient follow-up for continued monitoring and recheck of TSH in 4 to 6 weeks  DVT prophylaxis: SCDs   Code Status: Full Code Disposition: Inpatient for continued hemoglobin trending, pending SNF placement.  Consultants:  Treatment Team:  Consulting Physician: Shane Darling, MD Consulting Physician: [name], MD  Procedures:  Colonoscopy  Antimicrobials:  Anti-infectives (From admission, onward)    None       Data Reviewed: I have personally reviewed following labs and imaging studies CBC: Recent Labs  Lab 05/17/23 1335 05/17/23 1639 05/18/23 0319 05/18/23 0908 05/19/23 0435 05/20/23 0420 05/21/23 0404  WBC 7.6  --   --   --  6.3 9.0 7.3  NEUTROABS  --   --   --   --   --  7.7 5.7  HGB 8.0*   < > 8.7* 10.0* 9.6* 9.8* 8.9*  HCT 25.3*   < > 25.8* 29.2* 28.4* 29.2* 27.0*  MCV 88.2  --   --   --  84.3 85.4 87.9  PLT 411*  --   --   --  391 402* 343   < > = values in this interval not displayed.   Basic Metabolic Panel: Recent Labs  Lab 05/17/23 1335 05/18/23 0319 05/18/23 0415 05/19/23 0436 05/20/23 0420 05/21/23 0404  NA 141 141  --  140 135 141  K 3.4* 3.0*  --  3.9 4.3 4.3  CL 109 106  --  102 100 108  CO2 19* 24  --  26 23 24   GLUCOSE 124* 89  --  90 97 91  BUN 32* 25*  --  24* 26* 22  CREATININE 0.93 0.79  --  1.05* 1.32* 1.06*  CALCIUM  7.9* 7.7*  --  8.0* 8.2* 8.1*  MG  --  2.0  --   --  1.8 2.0  PHOS  --   --  2.9 3.3  --  2.8   GFR: Estimated Creatinine Clearance: 34.5 mL/min (A) (by C-G formula based on SCr of 1.06 mg/dL (H)). Liver Function Tests: Recent Labs  Lab 05/17/23 1335 05/18/23 0319 05/19/23 0436  05/20/23 0420  AST 91* 84*  --  152*  ALT 27 24  --  28  ALKPHOS 281* 258*  --  297*  BILITOT 0.6 0.8  --  0.9  PROT 6.4* 6.2*  --  6.5  ALBUMIN  2.2* 2.0* 2.0* 2.2*   CBG: No results for input(s): "GLUCAP" in the last 168 hours.  Recent Results (from the past 240 hours)  C Difficile Quick Screen w PCR reflex     Status: None   Collection Time: 05/18/23  8:12 AM   Specimen: STOOL  Result Value Ref Range Status   C Diff antigen NEGATIVE NEGATIVE Final   C Diff toxin NEGATIVE NEGATIVE Final   C Diff interpretation No C. difficile detected.  Final    Comment: Performed at [facility], [address] [place], [place] [address]  Gastrointestinal Panel by PCR , Stool  Status: None   Collection Time: 05/18/23 11:00 AM   Specimen: Stool  Result Value Ref Range Status   Campylobacter species NOT DETECTED NOT DETECTED Final   Plesimonas shigelloides NOT DETECTED NOT DETECTED Final   Salmonella species NOT DETECTED NOT DETECTED Final   Yersinia enterocolitica NOT DETECTED NOT DETECTED Final   Vibrio species NOT DETECTED NOT DETECTED Final   Vibrio cholerae NOT DETECTED NOT DETECTED Final   Enteroaggregative E coli (EAEC) NOT DETECTED NOT DETECTED Final   Enteropathogenic E coli (EPEC) NOT DETECTED NOT DETECTED Final   Enterotoxigenic E coli (ETEC) NOT DETECTED NOT DETECTED Final   Shiga like toxin producing E coli (STEC) NOT DETECTED NOT DETECTED Final   Shigella/Enteroinvasive E coli (EIEC) NOT DETECTED NOT DETECTED Final   Cryptosporidium NOT DETECTED NOT DETECTED Final   Cyclospora cayetanensis NOT DETECTED NOT DETECTED Final   Entamoeba histolytica NOT DETECTED NOT DETECTED Final   Giardia lamblia NOT DETECTED NOT DETECTED Final   Adenovirus F40/41 NOT DETECTED NOT DETECTED Final   Astrovirus NOT DETECTED NOT DETECTED Final   Norovirus GI/GII NOT DETECTED NOT DETECTED Final   Rotavirus A NOT DETECTED NOT DETECTED Final   Sapovirus (I, II, IV, and V) NOT DETECTED NOT  DETECTED Final    Comment: Performed at [facility], [address] [place], [place] [address]     Radiology Studies: No results found.  Scheduled Meds:  levothyroxine   88 mcg Oral QAC breakfast   pantoprazole   40 mg Oral BID   QUEtiapine   25 mg Oral QHS   sertraline   100 mg Oral QHS   sodium chloride  flush  3 mL Intravenous Q12H   Continuous Infusions:   LOS: 0 days  MDM: Patient is high risk for one or more organ failure.  They necessitate ongoing hospitalization for continued IV therapies and subsequent lab monitoring. Total time spent interpreting labs and vitals, reviewing the medical record, coordinating care amongst consultants and care team members, directly assessing and discussing care with the patient and/or family: 55 min  Beckham Buxbaum, DO [facility]  To contact the attending physician between 7A-7P please use Epic Chat. To contact the covering physician during after hours 7P-7A, please review Amion.  05/21/2023, 9:42 AM   *This document has been created with the assistance of dictation software. Please excuse typographical errors. *

## 2023-05-21 NOTE — Progress Notes (Signed)
 Occupational Therapy Treatment Patient Details Name: Elizabeth [name] MRN: [id number] DOB: [birth year] Today's Date: 05/21/2023   History of present illness Elizabeth [name] is a 73 y.o. female with medical history significant of hypothyroidism, GI bleeding, bipolar, schizophrenia, panic disorder, depression with anxiety, anemia, fibromyalgia, breast cancer (s/p for right mastectomy), right lymphedema presenting with weakness, symptomatic anemia, upper GI bleed, heart failure.  Patient reports worsening shortness of breath over multiple weeks.  Was recent diagnosed with COVID roughly 2 weeks ago.  Upper respiratory symptoms have resolved though weakness has persisted.  Positive orthopnea PND.  Minimal chest pain.  No abdominal pain.  No nausea.?  Intermittent loose stools.  Positive falls at home.   OT comments  Pt is seated in recliner on arrival. Pleasant and agreeable to OT session. She reports BLE pain, but does not specify a number. Pt performed STS from recliner and EOB multiple trials with CGA, increased time/cueing for safety. Pt required CGA for standing grooming tasks at sink. Mult seated rest breaks needed between mobility/activities with HR up to 135 with ambulation ~15 feet in the room. Nurse notified and pt edu on ECS/pacing/PLB techniques to prevent overexertion.  Pt returned to recliner with all needs in place and will cont to require skilled acute OT services to maximize her safety and IND to return to PLOF.       If plan is discharge home, recommend the following:  A lot of help with walking and/or transfers;A lot of help with bathing/dressing/bathroom;Assistance with cooking/housework;Assist for transportation   Equipment Recommendations  Other (comment) (defer)    Recommendations for Other Services      Precautions / Restrictions Precautions Precautions: Fall Restrictions Weight Bearing Restrictions Per Provider Order: No       Mobility Bed Mobility                General bed mobility comments: in recliner pre/post session    Transfers Overall transfer level: Needs assistance Equipment used: Rolling walker (2 wheels) Transfers: Sit to/from Stand Sit to Stand: Contact guard assist           General transfer comment: multiple STS from recliner and EOB for seated rest breaks between tasks; Min/CGA for mobility in room with BUE support on RW     Balance Overall balance assessment: Needs assistance Sitting-balance support: Bilateral upper extremity supported, Feet supported Sitting balance-Leahy Scale: Good     Standing balance support: Bilateral upper extremity supported, During functional activity, Reliant on assistive device for balance Standing balance-Leahy Scale: Fair Standing balance comment: RW use and CGA during oral care standing at sink                           ADL either performed or assessed with clinical judgement   ADL Overall ADL's : Needs assistance/impaired     Grooming: Oral care;Standing;Contact guard assist Grooming Details (indicate cue type and reason): standing at sink                                    Extremity/Trunk Assessment              Vision       Perception     Praxis     Communication Communication Communication: No apparent difficulties   Cognition Arousal: Alert Behavior During Therapy: Anxious  Following commands: Impaired Following commands impaired: Follows multi-step commands inconsistently      Cueing   Cueing Techniques: Verbal cues, Gestural cues, Tactile cues  Exercises Other Exercises Other Exercises: Edu on ECS, pacing, taking rest breaks, etc. d/t low activity tolerance and increased HR with activity during session.    Shoulder Instructions       General Comments HR up to 135 with mobility to the door and back, notable DOE-nurse notified    Pertinent Vitals/ Pain       Pain  Assessment Pain Assessment: Faces Faces Pain Scale: Hurts little more Pain Location: reports her legs Pain Intervention(s): Monitored during session, Limited activity within patient's tolerance  Home Living                                          Prior Functioning/Environment              Frequency  Min 1X/week        Progress Toward Goals  OT Goals(current goals can now be found in the care plan section)  Progress towards OT goals: Progressing toward goals  Acute Rehab OT Goals Patient Stated Goal: improve function OT Goal Formulation: With patient Time For Goal Achievement: 06/01/23 Potential to Achieve Goals: Good  Plan      Co-evaluation                 AM-PAC OT "6 Clicks" Daily Activity     Outcome Measure   Help from another person eating meals?: None Help from another person taking care of personal grooming?: A Little Help from another person toileting, which includes using toliet, bedpan, or urinal?: A Little Help from another person bathing (including washing, rinsing, drying)?: A Lot Help from another person to put on and taking off regular upper body clothing?: A Little Help from another person to put on and taking off regular lower body clothing?: A Lot 6 Click Score: 17    End of Session Equipment Utilized During Treatment: Rolling walker (2 wheels);Gait belt  OT Visit Diagnosis: Unsteadiness on feet (R26.81);Other abnormalities of gait and mobility (R26.89);Muscle weakness (generalized) (M62.81);Repeated falls (R29.6)   Activity Tolerance Patient tolerated treatment well   Patient Left in chair;with call bell/phone within reach;with nursing/sitter in room;with chair alarm set   Nurse Communication Mobility status        Time: 1000-1019 OT Time Calculation (min): 19 min  Charges: OT General Charges $OT Visit: 1 Visit OT Treatments $Self Care/Home Management : 8-22 mins  Elizabeth [name], OTR/L  05/21/23,  12:45 PM   Elizabeth [name] E Aditi Rovira 05/21/2023, 12:42 PM

## 2023-05-21 NOTE — Progress Notes (Signed)
 Physical Therapy Treatment Patient Details Name: [name] MRN: [id number] DOB: 10-Nov-1950 Today's Date: 05/21/2023   History of Present Illness Elizabeth [name] is a 73 y.o. female with medical history significant of hypothyroidism, GI bleeding, bipolar, schizophrenia, panic disorder, depression with anxiety, anemia, fibromyalgia, breast cancer (s/p for right mastectomy), right lymphedema presenting with weakness, symptomatic anemia, upper GI bleed, heart failure.  Patient reports worsening shortness of breath over multiple weeks.  Was recent diagnosed with COVID roughly 2 weeks ago.  Upper respiratory symptoms have resolved though weakness has persisted.  Positive orthopnea PND.  Minimal chest pain.  No abdominal pain.  No nausea.?  Intermittent loose stools.  Positive falls at home.    PT Comments  Pt pleasantly confused t/o session, willing to participate with minimal extra cuing needed.  She did have noted hesitancy with WBing, especially keeping L knee bent - this did improve with cuing for more WBing attempt/normalization on the L but ultimately this seems a more baseline issue.  She managed ~10 ft of ambulation before fatigue on both efforts, did have increased HR to 120s then 130s with each effort, O2 remains stabile in the high 90s on room air.  Pt reports general fatigue with with modest effort, will benefit from ongoing PT, continue with POC.     If plan is discharge home, recommend the following: A little help with walking and/or transfers;A little help with bathing/dressing/bathroom;Assistance with cooking/housework;Assist for transportation;Help with stairs or ramp for entrance   Can travel by private vehicle     Yes  Equipment Recommendations  None recommended by PT    Recommendations for Other Services       Precautions / Restrictions Precautions Precautions: Fall Restrictions Weight Bearing Restrictions Per Provider Order: No     Mobility  Bed  Mobility Overal bed mobility: Modified Independent             General bed mobility comments: definite use of UEs on rail and some extra time/cuing, but able to attain sitting w/o assist    Transfers Overall transfer level: Needs assistance Equipment used: Rolling walker (2 wheels) Transfers: Sit to/from Stand, Bed to chair/wheelchair/BSC Sit to Stand: Contact guard assist           General transfer comment: Pt needing time to get herself situated and minimal cuing for set up and sequencing but was able to rise w/o direct assist.  Definite need of UEs on AD once upright.    Ambulation/Gait Ambulation/Gait assistance: Contact guard assist, Min assist Gait Distance (Feet): 10 Feet Assistive device: Rolling walker (2 wheels)         General Gait Details: 10 f tX 2 with seated rest break.  Pt showed good effort with each ambulation bout but was highly UE reliant on the walker and showed poor overall tolerance.  Her HR rose with both efforts to 120s then mid 130s on second effort with reports of fatigue with each.  O2 remained in the high 90s despite consistently.   Stairs             Wheelchair Mobility     Tilt Bed    Modified Rankin (Stroke Patients Only)       Balance Overall balance assessment: Needs assistance Sitting-balance support: Bilateral upper extremity supported, Feet supported Sitting balance-Leahy Scale: Good     Standing balance support: Bilateral upper extremity supported, During functional activity Standing balance-Leahy Scale: Fair Standing balance comment: using RW  Communication Communication Communication: No apparent difficulties  Cognition Arousal: Alert Behavior During Therapy: Anxious   PT - Cognitive impairments: No apparent impairments                         Following commands: Impaired Following commands impaired: Follows multi-step commands inconsistently    Cueing  Cueing Techniques: Verbal cues, Gestural cues, Tactile cues  Exercises      General Comments        Pertinent Vitals/Pain Pain Assessment Pain Assessment: Faces Faces Pain Scale: Hurts little more Pain Location: report "legs" multiple times when asked but could not specify, clearly seemed to be hesitating with L knee WBing    Home Living                          Prior Function            PT Goals (current goals can now be found in the care plan section) Progress towards PT goals: Progressing toward goals    Frequency    Min 1X/week      PT Plan      Co-evaluation              AM-PAC PT "6 Clicks" Mobility   Outcome Measure  Help needed turning from your back to your side while in a flat bed without using bedrails?: None Help needed moving from lying on your back to sitting on the side of a flat bed without using bedrails?: None Help needed moving to and from a bed to a chair (including a wheelchair)?: A Little Help needed standing up from a chair using your arms (e.g., wheelchair or bedside chair)?: A Little Help needed to walk in hospital room?: A Little Help needed climbing 3-5 steps with a railing? : A Little 6 Click Score: 20    End of Session Equipment Utilized During Treatment: Gait belt Activity Tolerance: Patient tolerated treatment well Patient left: in chair;with call bell/phone within reach;with chair alarm set Nurse Communication: Mobility status PT Visit Diagnosis: Other abnormalities of gait and mobility (R26.89);Muscle weakness (generalized) (M62.81);History of falling (Z91.81)     Time: 1610-9604 PT Time Calculation (min) (ACUTE ONLY): 21 min  Charges:    $Gait Training: 8-22 mins PT General Charges $$ ACUTE PT VISIT: 1 Visit                     Darice Edelman, DPT 05/21/2023, 10:12 AM

## 2023-05-21 NOTE — Progress Notes (Signed)
 Elizabeth Sink, MD [facility]   [address] [place], [place] [address] Phone: [phone] Fax : [phone]   Subjective: The patient had a colonoscopy yesterday without any sign of active GI bleeding.  The patient had refused an upper endoscopy multiple times when she was asked.  The patient side had convinced her to undergo the colonoscopy yesterday which showed a right hemicolectomy but no old blood or fresh blood.  The patient states that she had black stools this morning and had a slight drop in her hemoglobin.  The patient repetitively asked me where she had bleeding from.   Objective: Vital signs in last 24 hours: Vitals:   05/21/23 0412 05/21/23 0416 05/21/23 0446 05/21/23 0734  BP: 132/72 132/72  122/72  Pulse: (!) 101   (!) 106  Resp: 18 18  16   Temp: 98.3 F (36.8 C) 98.3 F (36.8 C)  98.4 F (36.9 C)  TempSrc:      SpO2: 92% 92%  94%  Weight:   48.4 kg   Height:       Weight change: 3.493 kg  Intake/Output Summary (Last 24 hours) at 05/21/2023 1113 Last data filed at 05/20/2023 1500 Gross per 24 hour  Intake 632.34 ml  Output --  Net 632.34 ml     Exam: Heart:: Regular rate and rhythm or without murmur or extra heart sounds Lungs: normal and clear to auscultation and percussion Abdomen: soft, nontender, normal bowel sounds   Lab Results: @LABTEST2 @ Micro Results: Recent Results (from the past 240 hours)  C Difficile Quick Screen w PCR reflex     Status: None   Collection Time: 05/18/23  8:12 AM   Specimen: STOOL  Result Value Ref Range Status   C Diff antigen NEGATIVE NEGATIVE Final   C Diff toxin NEGATIVE NEGATIVE Final   C Diff interpretation No C. difficile detected.  Final    Comment: Performed at [facility], [address] [place], [place] [address]  Gastrointestinal Panel by PCR , Stool     Status: None   Collection Time: 05/18/23 11:00 AM   Specimen: Stool  Result Value Ref Range Status   Campylobacter species NOT  DETECTED NOT DETECTED Final   Plesimonas shigelloides NOT DETECTED NOT DETECTED Final   Salmonella species NOT DETECTED NOT DETECTED Final   Yersinia enterocolitica NOT DETECTED NOT DETECTED Final   Vibrio species NOT DETECTED NOT DETECTED Final   Vibrio cholerae NOT DETECTED NOT DETECTED Final   Enteroaggregative E coli (EAEC) NOT DETECTED NOT DETECTED Final   Enteropathogenic E coli (EPEC) NOT DETECTED NOT DETECTED Final   Enterotoxigenic E coli (ETEC) NOT DETECTED NOT DETECTED Final   Shiga like toxin producing E coli (STEC) NOT DETECTED NOT DETECTED Final   Shigella/Enteroinvasive E coli (EIEC) NOT DETECTED NOT DETECTED Final   Cryptosporidium NOT DETECTED NOT DETECTED Final   Cyclospora cayetanensis NOT DETECTED NOT DETECTED Final   Entamoeba histolytica NOT DETECTED NOT DETECTED Final   Giardia lamblia NOT DETECTED NOT DETECTED Final   Adenovirus F40/41 NOT DETECTED NOT DETECTED Final   Astrovirus NOT DETECTED NOT DETECTED Final   Norovirus GI/GII NOT DETECTED NOT DETECTED Final   Rotavirus A NOT DETECTED NOT DETECTED Final   Sapovirus (I, II, IV, and V) NOT DETECTED NOT DETECTED Final    Comment: Performed at [facility], [address] [place], [place] [address]   Studies/Results: No results found. Medications: I have reviewed the patient's current medications. Scheduled Meds:  levothyroxine   88 mcg Oral QAC breakfast   pantoprazole   40 mg Oral BID   QUEtiapine   25 mg Oral QHS   sertraline   100 mg Oral QHS   sodium chloride  flush  3 mL Intravenous Q12H   Continuous Infusions: PRN Meds:.acetaminophen , ondansetron  **OR** ondansetron  (ZOFRAN ) IV, sodium chloride  flush   Assessment: Principal Problem:   Symptomatic anemia Active Problems:   History of essential hypertension   Major depressive disorder, recurrent severe without psychotic features (HCC)   Hypothyroidism   GI bleed   Weakness   Heart failure (HCC)   AKI (acute kidney injury) (HCC)   Acute  on chronic heart failure with preserved ejection fraction (HFpEF) (HCC)    Plan: This patient has a this patient has a continued drop in her hemoglobin from 9.8 yesterday to 8.9 today.  Patient had a negative colonoscopy yesterday.  She reports that she had a bowel movement with dark stools again this morning.  The patient has refused an upper endoscopy and repeatedly asks me where she is bleeding from.  I told her that she is not bleeding from her colon but due to her not consenting for an upper endoscopy or any other further workup we cannot tell her for sure that she was was not bleeding.  This also may be an appropriate problem since there was no black or dark material seen during her colonoscopy.  The patient states that she will talk to her son and decide whether if she would reconsider about an upper endoscopy in the future.   LOS: 0 days   Elizabeth Sink, MD.FACG 05/21/2023, 11:13 AM Pager [phone] 7am-5pm  Check AMION for 5pm -7am coverage and on weekends

## 2023-05-21 NOTE — TOC Progression Note (Addendum)
 Transition of Care [facility]) - Progression Note    Patient Details  Name: Elizabeth [name] MRN: [id number] Date of Birth: [birth year]  Transition of Care [facility] CM/SW Contact  Elsie Halo, RN Phone Number: 05/21/2023, 2:29 PM  Clinical Narrative:    Patient received bed offer from [facility]. TOC spoke with the patient's son, [name] [phone]  who has been having conversations with his mom about going to the facility for STR. [name] advised that it's important that we refer to the facility as rehab and emphasize that it is short term. He also stated that his mother had an Uncle who was at [facility] in the past, so it may take some encouragement for her to go to the facility. [name] verbalized understanding that getting insurance authorization for the SNF can take up to 72 hours and encouraged TOC to pursue auth. Don Fritter is requesting auth from [facility].    TOC left a message with Gena at [facility] to notify her that ins [employer] is being requested.  TOC will continue to follow.   Expected Discharge Plan: Home w Home Health Services    Expected Discharge Plan and Services   Discharge Planning Services: CM Consult   Living arrangements for the past 2 months: Single Family Home                                       Social Determinants of Health (SDOH) Interventions SDOH Screenings   Food Insecurity: No Food Insecurity (05/17/2023)  Housing: Low Risk  (05/17/2023)  Transportation Needs: No Transportation Needs (05/17/2023)  Utilities: Not At Risk (05/17/2023)  Depression (PHQ2-9): High Risk (07/22/2022)  Financial Resource Strain: Low Risk  (08/20/2022)   Received from [facility]  Physical Activity: Insufficiently Active (08/06/2021)   Received from [facility], [facility]  Social Connections: Socially Isolated (05/17/2023)  Stress: No Stress Concern Present (08/27/2021)   Received from [facility], [facility]  Tobacco Use: Medium Risk (05/17/2023)    Readmission Risk Interventions     No data to display

## 2023-05-21 NOTE — Plan of Care (Signed)
 The patient has had no s/s of acute distress noted.

## 2023-05-22 ENCOUNTER — Encounter: Payer: Self-pay | Admitting: Family Medicine

## 2023-05-22 ENCOUNTER — Encounter
Admission: EM | Disposition: A | Discharge status: Home Health | Payer: Self-pay | Source: Home / Self Care | Attending: Family Medicine

## 2023-05-22 ENCOUNTER — Observation Stay: Admitting: Anesthesiology

## 2023-05-22 DIAGNOSIS — K317 Polyp of stomach and duodenum: Secondary | ICD-10-CM

## 2023-05-22 DIAGNOSIS — K319 Disease of stomach and duodenum, unspecified: Secondary | ICD-10-CM

## 2023-05-22 DIAGNOSIS — D649 Anemia, unspecified: Secondary | ICD-10-CM | POA: Diagnosis not present

## 2023-05-22 HISTORY — PX: ESOPHAGOGASTRODUODENOSCOPY: SHX5428

## 2023-05-22 HISTORY — PX: GIVENS CAPSULE STUDY: SHX5432

## 2023-05-22 LAB — MAGNESIUM: Magnesium: 2 mg/dL (ref 1.7–2.4)

## 2023-05-22 LAB — CBC WITH DIFFERENTIAL/PLATELET
Abs Immature Granulocytes: 0.18 10*3/uL — ABNORMAL HIGH (ref 0.00–0.07)
Basophils Absolute: 0.1 10*3/uL (ref 0.0–0.1)
Basophils Relative: 1 %
Eosinophils Absolute: 0.3 10*3/uL (ref 0.0–0.5)
Eosinophils Relative: 4 %
HCT: 29.1 % — ABNORMAL LOW (ref 36.0–46.0)
Hemoglobin: 9.3 g/dL — ABNORMAL LOW (ref 12.0–15.0)
Immature Granulocytes: 3 %
Lymphocytes Relative: 12 %
Lymphs Abs: 0.8 10*3/uL (ref 0.7–4.0)
MCH: 28.7 pg (ref 26.0–34.0)
MCHC: 32 g/dL (ref 30.0–36.0)
MCV: 89.8 fL (ref 80.0–100.0)
Monocytes Absolute: 0.4 10*3/uL (ref 0.1–1.0)
Monocytes Relative: 6 %
Neutro Abs: 5 10*3/uL (ref 1.7–7.7)
Neutrophils Relative %: 74 %
Platelets: 332 10*3/uL (ref 150–400)
RBC: 3.24 MIL/uL — ABNORMAL LOW (ref 3.87–5.11)
RDW: 20.7 % — ABNORMAL HIGH (ref 11.5–15.5)
WBC: 6.7 10*3/uL (ref 4.0–10.5)
nRBC: 0 % (ref 0.0–0.2)

## 2023-05-22 LAB — COMPREHENSIVE METABOLIC PANEL WITH GFR
ALT: 23 U/L (ref 0–44)
AST: 99 U/L — ABNORMAL HIGH (ref 15–41)
Albumin: 1.9 g/dL — ABNORMAL LOW (ref 3.5–5.0)
Alkaline Phosphatase: 348 U/L — ABNORMAL HIGH (ref 38–126)
Anion gap: 9 (ref 5–15)
BUN: 23 mg/dL (ref 8–23)
CO2: 23 mmol/L (ref 22–32)
Calcium: 7.9 mg/dL — ABNORMAL LOW (ref 8.9–10.3)
Chloride: 106 mmol/L (ref 98–111)
Creatinine, Ser: 1.01 mg/dL — ABNORMAL HIGH (ref 0.44–1.00)
GFR, Estimated: 59 mL/min — ABNORMAL LOW (ref >60)
Glucose, Bld: 100 mg/dL — ABNORMAL HIGH (ref 70–99)
Potassium: 4.2 mmol/L (ref 3.5–5.1)
Sodium: 138 mmol/L (ref 135–145)
Total Bilirubin: 0.6 mg/dL (ref 0.0–1.2)
Total Protein: 6.2 g/dL — ABNORMAL LOW (ref 6.5–8.1)

## 2023-05-22 LAB — PHOSPHORUS: Phosphorus: 2.9 mg/dL (ref 2.5–4.6)

## 2023-05-22 SURGERY — EGD (ESOPHAGOGASTRODUODENOSCOPY)
Anesthesia: General

## 2023-05-22 MED ORDER — LIDOCAINE HCL (CARDIAC) PF 100 MG/5ML IV SOSY
PREFILLED_SYRINGE | INTRAVENOUS | Status: DC | PRN
  Administered 2023-05-22: 50 mg via INTRAVENOUS

## 2023-05-22 MED ORDER — ORAL CARE MOUTH RINSE: 15.0000 mL | OROMUCOSAL | Status: DC | PRN

## 2023-05-22 MED ORDER — SODIUM CHLORIDE 0.9 % IV SOLN: INTRAVENOUS | Status: DC

## 2023-05-22 MED ORDER — LIDOCAINE HCL (PF) 2 % IJ SOLN
INTRAMUSCULAR | Status: AC
  Filled 2023-05-22: qty 5

## 2023-05-22 MED ORDER — PROPOFOL 10 MG/ML IV BOLUS
INTRAVENOUS | Status: DC | PRN
  Administered 2023-05-22 (×2): 20 mg via INTRAVENOUS
  Administered 2023-05-22: 40 mg via INTRAVENOUS

## 2023-05-22 NOTE — Progress Notes (Signed)
 After consulting with the patient's son the patient has agreed to proceed with an upper endoscopy today.  The patient is already been NPO.  If the upper endoscopy does not show any active GI bleeding then the patient may be considered for a capsule endoscopy. This patient's issues may stem from malabsorption of iron and she just may need IV iron infusions for her anemia if nothing is found.

## 2023-05-22 NOTE — Anesthesia Preprocedure Evaluation (Addendum)
 Anesthesia Evaluation  Patient identified by MRN, date of birth, ID band Patient awake    Reviewed: Allergy & Precautions, NPO status , Patient's Chart, lab work & pertinent test results  History of Anesthesia Complications Negative for: history of anesthetic complications  Airway Mallampati: III  TM Distance: <3 FB Neck ROM: full    Dental  (+) Chipped   Pulmonary shortness of breath and with exertion, former smoker   Pulmonary exam normal        Cardiovascular Exercise Tolerance: Good (-) angina +CHF  Normal cardiovascular exam     Neuro/Psych  PSYCHIATRIC DISORDERS       Neuromuscular disease    GI/Hepatic negative GI ROS, Neg liver ROS,,,  Endo/Other  Hypothyroidism    Renal/GU Renal disease  negative genitourinary   Musculoskeletal   Abdominal   Peds  Hematology negative hematology ROS (+)   Anesthesia Other Findings Past Medical History: 2016: Acute cholecystitis No date: Anemia 2016: Anemia No date: Anxiety     Comment:  panic attacks No date: Anxiety and depression No date: Arthritis No date: Bipolar 1 disorder (HCC) 2017: Bowel obstruction (HCC) 2015: Common bile duct dilatation No date: Dementia (HCC) No date: Dyspnea No date: Edema No date: Fibromyalgia 2017: GI bleed 01/21/2009: History of kidney stones 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 No date: Hyperglycemia No date: Hyperlipidemia No date: Hypokalemia No date: Hypothyroidism No date: Liver cyst No date: Malignant neoplasm of right female breast, unspecified  estrogen receptor status, unspecified site of breast (HCC) No date: Memory loss No date: Osteopenia No date: Panic disorder No date: Schizophrenia (HCC) No date: Ventral hernia  Past Surgical History: No date: ABDOMINAL HYSTERECTOMY No date: APPENDECTOMY 01/19/2021: BREAST BIOPSY; Right     Comment:  US  Bx, ribbon, path pending 01/19/2021: BREAST BIOPSY; Right     Comment:  US   Bx, Venus, path pending 01/19/2021: BREAST BIOPSY; Right     Comment:  US  Bx, heart, path pending 01/19/2021: BREAST BIOPSY; Right     Comment:  US  BX Axilla, Coil, path pending No date: CESAREAN SECTION 11/17/2013: CHOLECYSTECTOMY OPEN     Comment:  with repair of bile duct-Dr. Adele Admire 05/20/2023: COLONOSCOPY; N/A     Comment:  Procedure: COLONOSCOPY;  Surgeon: [name], MD;                Location: [facility];  Service: Endoscopy;                Laterality: N/A; 03/02/2021: MASTECTOMY MODIFIED RADICAL; Right     Comment:  Procedure: MASTECTOMY MODIFIED RADICAL;  Surgeon: Conrado Delay, DO;  Location: [facility] ORS;  Service: General;                Laterality: Right; 12/20/2014: OPEN REDUCTION INTERNAL FIXATION (ORIF) DISTAL RADIAL  FRACTURE; Right     Comment:  Procedure: OPEN REDUCTION INTERNAL FIXATION (ORIF) RIGHT              DISTAL RADIAL FRACTURE;  Surgeon: Saundra Curl, MD;                Location: [facility];  Service:               Orthopedics;  Laterality: Right; No date: STOMACH SURGERY  BMI    Body Mass Index: 20.06 kg/m      Reproductive/Obstetrics negative OB ROS  Anesthesia Physical Anesthesia Plan  ASA: 3  Anesthesia Plan: General   Post-op Pain Management:    Induction: Intravenous  PONV Risk Score and Plan: Propofol  infusion and TIVA  Airway Management Planned: Natural Airway and Nasal Cannula  Additional Equipment:   Intra-op Plan:   Post-operative Plan:   Informed Consent: I have reviewed the patients History and Physical, chart, labs and discussed the procedure including the risks, benefits and alternatives for the proposed anesthesia with the patient or authorized representative who has indicated his/her understanding and acceptance.     Dental Advisory Given  Plan Discussed with: Anesthesiologist, CRNA and Surgeon  Anesthesia Plan Comments: (History  and phone consent from the patients son Isatou Shepp at [phone]  Son consented for risks of anesthesia including but not limited to:  - adverse reactions to medications - risk of airway placement if required - damage to eyes, teeth, lips or other oral mucosa - nerve damage due to positioning  - sore throat or hoarseness - Damage to heart, brain, nerves, lungs, other parts of body or loss of life  He voiced understanding and assent.)        Anesthesia Quick Evaluation

## 2023-05-22 NOTE — Transfer of Care (Signed)
 Immediate Anesthesia Transfer of Care Note  Patient: Elizabeth [name]  Procedure(s) Performed: EGD (ESOPHAGOGASTRODUODENOSCOPY)  Patient Location: PACU and Endoscopy Unit  Anesthesia Type:General  Level of Consciousness: drowsy  Airway & Oxygen Therapy: Patient Spontanous Breathing  Post-op Assessment: Report given to RN and Post -op Vital signs reviewed and stable  Post vital signs: Reviewed and stable  Last Vitals:  Vitals Value Taken Time  BP 117/60 05/22/23 1248  Temp 35.9 C 05/22/23 1247  Pulse 90 05/22/23 1248  Resp 28 05/22/23 1248  SpO2 98 % 05/22/23 1248  Vitals shown include unfiled device data.  Last Pain:  Vitals:   05/22/23 1247  TempSrc: Temporal  PainSc:       Patients Stated Pain Goal: 0 (05/17/23 2000)  Complications: No notable events documented.

## 2023-05-22 NOTE — Progress Notes (Addendum)
 PROGRESS NOTE    Elizabeth [name]  ZOX:[id number] DOB: Aug 27, 1950 DOA: 05/17/2023 PCP: [facility]  Chief Complaint  Patient presents with   Fall   Diarrhea    Hospital Course:  Elizabeth [name] is a 73 year old female with hypothyroidism, GI bleeds, bipolar disorder, schizophrenia, panic disorder, depression, anxiety, anemia, fibromyalgia, breast cancer status post right mastectomy, right-sided lymphedema, heart failure with preserved EF, recent diagnosis of COVID-19 on 4/8 and extensive surgical history including duodenectomy, reimplantation of ampulla, gastrojejunostomy, right hemicolectomy with 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 On this admission she presents with diarrhea, dyspnea, generalized weakness for 2 weeks.  She also endorses dark stools and frequent falls at home.  Patient was found to be anemic to hemoglobin of 7.1.  She received 1 unit PRBC.  She underwent colonoscopy on 4/29 which revealed nonbleeding internal hemorrhoids and patent end-to-side ileo-colonic anastomosis  subjective: Patient was evaluated prior to EGD today.  She endorses that she is hungry and ready to eat.  She has some hesitation about upcoming anesthesia for the procedure.   Objective: Vitals:   05/21/23 1537 05/21/23 2045 05/22/23 0500 05/22/23 0536  BP: 115/70 (!) 112/59  130/67  Pulse: 100 97  (!) 105  Resp: 18 20  15   Temp: 98 F (36.7 C) 98.1 F (36.7 C)  98.9 F (37.2 C)  TempSrc:    Oral  SpO2: 93% 95%  94%  Weight:   46.6 kg 46.6 kg  Height:       No intake or output data in the 24 hours ending 05/22/23 0929  Filed Weights   05/21/23 0446 05/22/23 0500 05/22/23 0536  Weight: 48.4 kg 46.6 kg 46.6 kg    Examination: General exam: Appears calm and comfortable, NAD  Respiratory system: No work of breathing, symmetric chest wall expansion Cardiovascular system: S1 & S2  heard, RRR.  Gastrointestinal system: Abdomen is nondistended, soft, diffusely tender to palpation Neuro: Alert and oriented. No focal neurological deficits. Extremities: Symmetric, expected ROM Skin: No rashes, lesions Psychiatry: Demonstrates appropriate judgement and insight. Mood & affect appropriate for situation.   Assessment & Plan:  Principal Problem:   Symptomatic anemia Active Problems:   Major depressive disorder, recurrent severe without psychotic features (HCC)   GI bleed   Weakness   Heart failure (HCC)   Hypothyroidism   History of essential hypertension   AKI (acute kidney injury) (HCC)   Acute on chronic heart failure with preserved ejection fraction (HFpEF) (HCC)    Acute blood loss anemia History of chronic iron deficiency anemia - Status post 1 unit PRBC on 4/26 for hemoglobin 7.1 - Continue monitor hemoglobin, currently stable above 9 - Iron studies: Iron 47, TIBC 122, sat ratio 39, ferritin 830.  No evidence of iron deficiency at this time - B12: 625 - Colonoscopy mostly unremarkable 4/29 - EGD today: Normal esophagus, multiple gastric polyps which were biopsied, no obvious source of bleeding.  Capsule endoscopy was initiated, to be read tomorrow. - GI endorses concerns with iron absorption abilities.  May require IV iron in the future.  AKI - Likely prerenal given diarrhea - Baseline 0.9, up to 1.32.  Resolving now. - Continue to follow CMP - Encourage oral intake - Status post IV fluids - Avoid nephrotoxic meds  Hypokalemia - Replace as needed  Elevated vitamin D  - Patient was taking high-dose vitamin D  prior to admission - Discontinue at discharge  Generalized weakness - PT recommending discharge to SNF  Bipolar disorder Schizophrenia Anxiety Depression - Resume home meds - Frequent reorientation  History of breast cancer status post mastectomy - Mastectomy February 2023 - On letrozole  at home - Chronic lymphedema.  Acute on  chronic diastolic heart failure - Echo with normal EF, grade 1 diastolic dysfunction, normal pulmonary pressures. - Cardiology evaluated patient during this admission for clearance for colonoscopy. - Resume home meds, GDMT as BP tolerates.  Hypothyroidism - TSH 5.26 - Takes 88 mcg levothyroxine  at home, have increased to 100 mcg - Needs outpatient follow-up for recheck TSH in 4 to 6 weeks  Hypoalbuminemia - Suspect dietary deficiencies.  RD consult  DVT prophylaxis: SCDs   Code Status: Full Code Disposition: Currently with PillCam endoscopy, pending read tomorrow.  May be able to discharge to SNF once passed cam tomorrow  Consultants:  Treatment Team:  Consulting Physician: Shane Darling, MD Consulting Physician: [name], MD  Procedures:  Colonoscopy  Antimicrobials:  Anti-infectives (From admission, onward)    None       Data Reviewed: I have personally reviewed following labs and imaging studies CBC: Recent Labs  Lab 05/17/23 1335 05/17/23 1639 05/18/23 0908 05/19/23 0435 05/20/23 0420 05/21/23 0404 05/22/23 0613  WBC 7.6  --   --  6.3 9.0 7.3 6.7  NEUTROABS  --   --   --   --  7.7 5.7 5.0  HGB 8.0*   < > 10.0* 9.6* 9.8* 8.9* 9.3*  HCT 25.3*   < > 29.2* 28.4* 29.2* 27.0* 29.1*  MCV 88.2  --   --  84.3 85.4 87.9 89.8  PLT 411*  --   --  391 402* 343 332   < > = values in this interval not displayed.   Basic Metabolic Panel: Recent Labs  Lab 05/18/23 0319 05/18/23 0415 05/19/23 0436 05/20/23 0420 05/21/23 0404 05/22/23 0613  NA 141  --  140 135 141 138  K 3.0*  --  3.9 4.3 4.3 4.2  CL 106  --  102 100 108 106  CO2 24  --  26 23 24 23   GLUCOSE 89  --  90 97 91 100*  BUN 25*  --  24* 26* 22 23  CREATININE 0.79  --  1.05* 1.32* 1.06* 1.01*  CALCIUM  7.7*  --  8.0* 8.2* 8.1* 7.9*  MG 2.0  --   --  1.8 2.0 2.0  PHOS  --  2.9 3.3  --  2.8 2.9   GFR: Estimated Creatinine Clearance: 36.2 mL/min (A) (by C-G formula based on SCr of 1.01 mg/dL  (H)). Liver Function Tests: Recent Labs  Lab 05/17/23 1335 05/18/23 0319 05/19/23 0436 05/20/23 0420 05/22/23 0613  AST 91* 84*  --  152* 99*  ALT 27 24  --  28 23  ALKPHOS 281* 258*  --  297* 348*  BILITOT 0.6 0.8  --  0.9 0.6  PROT 6.4* 6.2*  --  6.5 6.2*  ALBUMIN  2.2* 2.0* 2.0* 2.2* 1.9*   CBG: No results for input(s): "GLUCAP" in the last 168 hours.  Recent Results (from the past 240 hours)  C Difficile Quick Screen w PCR reflex     Status: None   Collection Time: 05/18/23  8:12 AM   Specimen: STOOL  Result Value Ref Range Status   C Diff antigen NEGATIVE NEGATIVE Final   C Diff toxin NEGATIVE NEGATIVE Final   C Diff interpretation No C. difficile detected.  Final    Comment: Performed at [facility], [address] [place], [place] [address]  Gastrointestinal Panel by PCR , Stool     Status: None   Collection Time: 05/18/23 11:00 AM   Specimen: Stool  Result Value Ref Range Status   Campylobacter species NOT DETECTED NOT DETECTED Final   Plesimonas shigelloides NOT DETECTED NOT DETECTED Final   Salmonella species NOT DETECTED NOT DETECTED Final   Yersinia enterocolitica NOT DETECTED NOT DETECTED Final   Vibrio species NOT DETECTED NOT DETECTED Final   Vibrio cholerae NOT DETECTED NOT DETECTED Final   Enteroaggregative E coli (EAEC) NOT DETECTED NOT DETECTED Final   Enteropathogenic E coli (EPEC) NOT DETECTED NOT DETECTED Final   Enterotoxigenic E coli (ETEC) NOT DETECTED NOT DETECTED Final   Shiga like toxin producing E coli (STEC) NOT DETECTED NOT DETECTED Final   Shigella/Enteroinvasive E coli (EIEC) NOT DETECTED NOT DETECTED Final   Cryptosporidium NOT DETECTED NOT DETECTED Final   Cyclospora cayetanensis NOT DETECTED NOT DETECTED Final   Entamoeba histolytica NOT DETECTED NOT DETECTED Final   Giardia lamblia NOT DETECTED NOT DETECTED Final   Adenovirus F40/41 NOT DETECTED NOT DETECTED Final   Astrovirus NOT DETECTED NOT DETECTED Final   Norovirus  GI/GII NOT DETECTED NOT DETECTED Final   Rotavirus A NOT DETECTED NOT DETECTED Final   Sapovirus (I, II, IV, and V) NOT DETECTED NOT DETECTED Final    Comment: Performed at [facility], [address], [place], [place] [address]     Radiology Studies: No results found.  Scheduled Meds:  levothyroxine   100 mcg Oral QAC breakfast   pantoprazole   40 mg Oral BID   QUEtiapine   25 mg Oral QHS   sertraline   100 mg Oral QHS   sodium chloride  flush  3 mL Intravenous Q12H   Continuous Infusions:   LOS: 0 days  MDM: Patient is high risk for one or more organ failure.  They necessitate ongoing hospitalization for continued IV therapies and subsequent lab monitoring. Total time spent interpreting labs and vitals, reviewing the medical record, coordinating care amongst consultants and care team members, directly assessing and discussing care with the patient and/or family: 55 min  Eytan Carrigan, DO [facility]  To contact the attending physician between 7A-7P please use Epic Chat. To contact the covering physician during after hours 7P-7A, please review Amion.  05/22/2023, 9:29 AM   *This document has been created with the assistance of dictation software. Please excuse typographical errors. *

## 2023-05-22 NOTE — Anesthesia Postprocedure Evaluation (Signed)
 Anesthesia Post Note  Patient: [name]  Procedure(s) Performed: EGD (ESOPHAGOGASTRODUODENOSCOPY) IMAGING PROCEDURE, GI TRACT, INTRALUMINAL, VIA CAPSULE  Patient location during evaluation: Endoscopy Anesthesia Type: General Level of consciousness: awake and alert Pain management: pain level controlled Vital Signs Assessment: post-procedure vital signs reviewed and stable Respiratory status: spontaneous breathing, nonlabored ventilation, respiratory function stable and patient connected to nasal cannula oxygen Cardiovascular status: blood pressure returned to baseline and stable Postop Assessment: no apparent nausea or vomiting Anesthetic complications: no   No notable events documented.   Last Vitals:  Vitals:   05/22/23 1248 05/22/23 1257  BP: 117/60 116/69  Pulse: 89   Resp: (!) 23   Temp:    SpO2: 95%     Last Pain:  Vitals:   05/22/23 1257  TempSrc:   PainSc: 0-No pain                 Portia Brittle Rain [place]

## 2023-05-22 NOTE — OR Nursing (Signed)
Capsule endoscopy was initiated without complications.

## 2023-05-22 NOTE — Progress Notes (Signed)
 PT Cancellation Note  Patient Details Name: Elizabeth [name] MRN: [id number] DOB: 09-Apr-1950   Cancelled Treatment:     Pt off floor receiving Upper Endoscopy via General Anesthesia. Continue PT per POC, pt awaiting transition to [facility] once medically cleared.    Diona Franklin 05/22/2023, 12:06 PM

## 2023-05-22 NOTE — Progress Notes (Addendum)
 Initial Nutrition Assessment  DOCUMENTATION CODES:   Severe malnutrition in context of chronic illness  INTERVENTION:   -RD will follow for diet advancement and add supplements as appropriate -MVI with minerals daily -10,000 units vitamin A  daily x 60 days  NUTRITION DIAGNOSIS:   Moderate Malnutrition related to chronic illness (CHF, schizophrenia) as evidenced by moderate fat depletion, severe fat depletion, moderate muscle depletion, severe muscle depletion.  GOAL:   Patient will meet greater than or equal to 90% of their needs  MONITOR:   PO intake, Supplement acceptance, Diet advancement  REASON FOR ASSESSMENT:   Consult Assessment of nutrition requirement/status  ASSESSMENT:   Pt with medical history significant of hypothyroidism, GI bleeding, bipolar, schizophrenia, panic disorder, depression with anxiety, anemia, fibromyalgia, breast cancer (s/p for right mastectomy), right lymphedema presenting with weakness, symptomatic anemia, upper GI bleed, heart failure.  Patient reports worsening shortness of breath over multiple weeks.  Pt admitted with MDD and GI bleed.   4/29- s/p colonoscopy- revealed non-bleeding internal hemorrhoids, patent end to side ileo-colonic anastomosis  5/1- s/p EDG- antrectomy found, multiple gastric polyps (biopsied), capsule endoscopy initiated    Reviewed I/O's: +100 ml x 24 hours and -228 ml since admission  Spoke with pt and caregiver at bedside. Pt reports feeling better after procedure- just had a bowel movement. Pt reports her appetite has been good and that she has been eating her meals and consuming additional food from home. Favorite foods include [employer] sandwiches and curly fries.   Per pt, she reports her jaw often gets displaced, which intermittently causes discomfort in eating. RD discussed potential of downgrading diet for ease of intake, however, pt thinks she would better benefit from wider variety of meal selections and would  prefer to continue with regular diet. Per pt, plan to follow up with dentist to address this.   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 Pt fixated on results of nutrition-focused physical exam. Discussed importance of good meal intake to promote healing. Pt reluctant to take supplements.   Pt had been followed by RD at [facility] previously, who feels like malnutrition is related to psych issues. Pt had been referred to RD specializing in eating disorders.   Reviewed wt hx; pt has experienced a 9.9% wt loss over the past 6 months. While this has not been significant for time frame, it is concerning given extensive medical history. Pt reports her UBW is around 100#. Noted pt was last weighed around 104#. Pt shares she has weighed 90# in the past.   Case discussed with pharmacy and MD. Vitamin A  level is low. Plan to supplement.   Per TOC notes, plan for SNF ([facility]) once medically stable.   Medications reviewed and include protonix .   Labs reviewed: CBGS: 128 (inpatient orders for glycemic control are none). Vitamin A : 4.5.   NUTRITION - FOCUSED PHYSICAL EXAM:  Flowsheet Row Most Recent Value  Orbital Region Severe depletion  Upper Arm Region Severe depletion  Thoracic and Lumbar Region Moderate depletion  Buccal Region Moderate depletion  Temple Region Severe depletion  Clavicle Bone Region Severe depletion  Clavicle and Acromion Bone Region Severe depletion  Scapular Bone Region Severe depletion  Dorsal Hand Severe depletion  Patellar Region Moderate depletion  Anterior Thigh Region Moderate depletion  Posterior Calf Region Moderate depletion  Edema (RD Assessment) None  Hair Reviewed  Eyes Reviewed  Mouth Reviewed  Skin Reviewed  Nails Reviewed  Diet Order:   Diet Order             Diet NPO time specified  Diet effective now                   EDUCATION NEEDS:   Education needs have been  addressed  Skin:  Skin Assessment: Reviewed RN Assessment  Last BM:  05/22/23  Height:   Ht Readings from Last 1 Encounters:  05/17/23 5' (1.524 m)    Weight:   Wt Readings from Last 1 Encounters:  05/22/23 46.6 kg    Ideal Body Weight:  45.5 kg  BMI:  Body mass index is 20.06 kg/m.  Estimated Nutritional Needs:   Kcal:  1850-2050  Protein:  90-105 grams  Fluid:  > 1.8 L    Herschel Lords, RD, LDN, CDCES Registered Dietitian III Certified Diabetes Care and Education Specialist If unable to reach this RD, please use "RD Inpatient" group chat on secure chat between hours of 8am-4 pm daily

## 2023-05-22 NOTE — TOC Progression Note (Addendum)
 Transition of Care [facility]) - Progression Note    Patient Details  Name: Elizabeth [name] MRN: [id number] Date of Birth: 01-Jul-1950  [facility] CM/SW Contact  Elsie Halo, RN Phone Number: 05/22/2023, 11:11 AM  Clinical Narrative:    Patient npo for an upper endoscopy today. Insurance Authorization has been obtained for [facility]:  Approved PlanAuthID:[id number] Dates:4/30-05/23/2023 Next Review Date: 05/23/2023   [facility] outreached to the patient's son [name] [phone] to make him aware of the insurance auth. He advised that the patient uses a pump for R arm lymphadema and also a sleeve.  Patient has had difficulty getting oncology meds at a facility in the past . If there is any difficulty getting the meds the son will help.  TOC will continue to follow.   Expected Discharge Plan: Home w Home Health Services    Expected Discharge Plan and Services   Discharge Planning Services: CM Consult   Living arrangements for the past 2 months: Single Family Home                                       Social Determinants of Health (SDOH) Interventions SDOH Screenings   Food Insecurity: No Food Insecurity (05/17/2023)  Housing: Low Risk  (05/17/2023)  Transportation Needs: No Transportation Needs (05/17/2023)  Utilities: Not At Risk (05/17/2023)  Depression (PHQ2-9): High Risk (07/22/2022)  Financial Resource Strain: Low Risk  (08/20/2022)   Received from [facility]  Physical Activity: Insufficiently Active (08/06/2021)   Received from [facility], [facility]  Social Connections: Socially Isolated (05/17/2023)  Stress: No Stress Concern Present (08/27/2021)   Received from [facility], [facility]  Tobacco Use: Medium Risk (05/22/2023)    Readmission Risk Interventions     No data to display

## 2023-05-22 NOTE — Op Note (Signed)
 [facility] Patient Name: Elizabeth [name] Procedure Date: 05/22/2023 12:24 PM MRN: [id number] Account #: [id number] Date of Birth: [birth year] Admit Type: Inpatient Age: 73 Room: [facility] ENDO ROOM 4 Gender: Female Note Status: Finalized Instrument Name: Upper Endoscope [id number] Procedure:             Upper GI endoscopy Indications:           Iron deficiency anemia Providers:             [name] MD, MD Referring MD:          [facility] primary Medicines:             Propofol  per Anesthesia Complications:         No immediate complications. Procedure:             Pre-Anesthesia Assessment:                        - Prior to the procedure, a History and Physical was                         performed, and patient medications and allergies were                         reviewed. The patient's tolerance of previous                         anesthesia was also reviewed. The risks and benefits                         of the procedure and the sedation options and risks                         were discussed with the patient. All questions were                         answered, and informed consent was obtained. Prior                         Anticoagulants: The patient has taken no anticoagulant                         or antiplatelet agents. ASA Grade Assessment: II - A                         patient with mild systemic disease. After reviewing                         the risks and benefits, the patient was deemed in                         satisfactory condition to undergo the procedure.                        After obtaining informed consent, the endoscope was                         passed under direct vision. Throughout the procedure,  the patient's blood pressure, pulse, and oxygen                         saturations were monitored continuously. The Endoscope                         was introduced through the mouth, and advanced  to the                         jejunum. The upper GI endoscopy was accomplished                         without difficulty. The patient tolerated the                         procedure well. Findings:      The examined esophagus was normal.      Evidence of an antrectomy was found in the gastric body.      Multiple sessile polyps with no bleeding and no stigmata of recent       bleeding were found at the anastomosis. Biopsies were taken with a cold       forceps for histology.      The examined jejunum was normal. Impression:            - Normal esophagus.                        - An antrectomy was found.                        - Multiple gastric polyps. Biopsied.                        - Normal examined jejunum. Recommendation:        - Return patient to hospital ward for ongoing care.                        - Resume previous diet.                        - Continue present medications. Procedure Code(s):     --- Professional ---                        [phone], Esophagogastroduodenoscopy, flexible,                         transoral; with biopsy, single or multiple Diagnosis Code(s):     --- Professional ---                        D50.9, Iron deficiency anemia, unspecified                        K31.7, Polyp of stomach and duodenum CPT copyright 2022 American Medical Association. All rights reserved. The codes documented in this report are preliminary and upon coder review may  be revised to meet current compliance requirements. [name] MD, MD 05/22/2023 12:42:45 PM This report has been signed electronically. Number of Addenda: 0 Note Initiated On: 05/22/2023 12:24 PM Estimated Blood Loss:  Estimated blood loss: none.  [facility]

## 2023-05-22 NOTE — Plan of Care (Signed)
 Elizabeth [name]

## 2023-05-23 DIAGNOSIS — D509 Iron deficiency anemia, unspecified: Secondary | ICD-10-CM | POA: Diagnosis present

## 2023-05-23 DIAGNOSIS — I5033 Acute on chronic diastolic (congestive) heart failure: Secondary | ICD-10-CM | POA: Diagnosis present

## 2023-05-23 DIAGNOSIS — D62 Acute posthemorrhagic anemia: Secondary | ICD-10-CM | POA: Diagnosis present

## 2023-05-23 DIAGNOSIS — K922 Gastrointestinal hemorrhage, unspecified: Secondary | ICD-10-CM | POA: Diagnosis present

## 2023-05-23 DIAGNOSIS — I110 Hypertensive heart disease with heart failure: Secondary | ICD-10-CM | POA: Diagnosis present

## 2023-05-23 DIAGNOSIS — C787 Secondary malignant neoplasm of liver and intrahepatic bile duct: Secondary | ICD-10-CM | POA: Diagnosis present

## 2023-05-23 DIAGNOSIS — Y92009 Unspecified place in unspecified non-institutional (private) residence as the place of occurrence of the external cause: Secondary | ICD-10-CM | POA: Diagnosis not present

## 2023-05-23 DIAGNOSIS — K5521 Angiodysplasia of colon with hemorrhage: Secondary | ICD-10-CM | POA: Diagnosis present

## 2023-05-23 DIAGNOSIS — K289 Gastrojejunal ulcer, unspecified as acute or chronic, without hemorrhage or perforation: Secondary | ICD-10-CM

## 2023-05-23 DIAGNOSIS — E039 Hypothyroidism, unspecified: Secondary | ICD-10-CM | POA: Diagnosis present

## 2023-05-23 DIAGNOSIS — Z66 Do not resuscitate: Secondary | ICD-10-CM | POA: Diagnosis present

## 2023-05-23 DIAGNOSIS — N179 Acute kidney failure, unspecified: Secondary | ICD-10-CM | POA: Diagnosis not present

## 2023-05-23 DIAGNOSIS — F03A4 Unspecified dementia, mild, with anxiety: Secondary | ICD-10-CM | POA: Diagnosis present

## 2023-05-23 DIAGNOSIS — F209 Schizophrenia, unspecified: Secondary | ICD-10-CM | POA: Diagnosis present

## 2023-05-23 DIAGNOSIS — E43 Unspecified severe protein-calorie malnutrition: Secondary | ICD-10-CM | POA: Diagnosis present

## 2023-05-23 DIAGNOSIS — R531 Weakness: Secondary | ICD-10-CM | POA: Diagnosis not present

## 2023-05-23 DIAGNOSIS — W19XXXA Unspecified fall, initial encounter: Secondary | ICD-10-CM | POA: Diagnosis present

## 2023-05-23 DIAGNOSIS — K6389 Other specified diseases of intestine: Secondary | ICD-10-CM

## 2023-05-23 DIAGNOSIS — C7951 Secondary malignant neoplasm of bone: Secondary | ICD-10-CM | POA: Diagnosis present

## 2023-05-23 DIAGNOSIS — K921 Melena: Secondary | ICD-10-CM | POA: Diagnosis present

## 2023-05-23 DIAGNOSIS — F332 Major depressive disorder, recurrent severe without psychotic features: Secondary | ICD-10-CM | POA: Diagnosis not present

## 2023-05-23 DIAGNOSIS — D649 Anemia, unspecified: Secondary | ICD-10-CM | POA: Diagnosis not present

## 2023-05-23 DIAGNOSIS — Q438 Other specified congenital malformations of intestine: Secondary | ICD-10-CM | POA: Diagnosis not present

## 2023-05-23 DIAGNOSIS — E8809 Other disorders of plasma-protein metabolism, not elsewhere classified: Secondary | ICD-10-CM | POA: Diagnosis present

## 2023-05-23 DIAGNOSIS — F03A3 Unspecified dementia, mild, with mood disturbance: Secondary | ICD-10-CM | POA: Diagnosis present

## 2023-05-23 DIAGNOSIS — Z8616 Personal history of COVID-19: Secondary | ICD-10-CM | POA: Diagnosis not present

## 2023-05-23 LAB — COMPREHENSIVE METABOLIC PANEL WITH GFR
ALT: 25 U/L (ref 0–44)
AST: 97 U/L — ABNORMAL HIGH (ref 15–41)
Albumin: 2.2 g/dL — ABNORMAL LOW (ref 3.5–5.0)
Alkaline Phosphatase: 345 U/L — ABNORMAL HIGH (ref 38–126)
Anion gap: 10 (ref 5–15)
BUN: 24 mg/dL — ABNORMAL HIGH (ref 8–23)
CO2: 22 mmol/L (ref 22–32)
Calcium: 8.1 mg/dL — ABNORMAL LOW (ref 8.9–10.3)
Chloride: 103 mmol/L (ref 98–111)
Creatinine, Ser: 0.95 mg/dL (ref 0.44–1.00)
GFR, Estimated: >60 mL/min (ref >60)
Glucose, Bld: 91 mg/dL (ref 70–99)
Potassium: 3.9 mmol/L (ref 3.5–5.1)
Sodium: 135 mmol/L (ref 135–145)
Total Bilirubin: 0.8 mg/dL (ref 0.0–1.2)
Total Protein: 6.3 g/dL — ABNORMAL LOW (ref 6.5–8.1)

## 2023-05-23 LAB — MAGNESIUM: Magnesium: 2.1 mg/dL (ref 1.7–2.4)

## 2023-05-23 LAB — SURGICAL PATHOLOGY

## 2023-05-23 LAB — CBC WITH DIFFERENTIAL/PLATELET
Abs Immature Granulocytes: 0.35 10*3/uL — ABNORMAL HIGH (ref 0.00–0.07)
Basophils Absolute: 0.1 10*3/uL (ref 0.0–0.1)
Basophils Relative: 1 %
Eosinophils Absolute: 0.4 10*3/uL (ref 0.0–0.5)
Eosinophils Relative: 5 %
HCT: 29.7 % — ABNORMAL LOW (ref 36.0–46.0)
Hemoglobin: 9.5 g/dL — ABNORMAL LOW (ref 12.0–15.0)
Immature Granulocytes: 5 %
Lymphocytes Relative: 10 %
Lymphs Abs: 0.8 10*3/uL (ref 0.7–4.0)
MCH: 28.5 pg (ref 26.0–34.0)
MCHC: 32 g/dL (ref 30.0–36.0)
MCV: 89.2 fL (ref 80.0–100.0)
Monocytes Absolute: 0.6 10*3/uL (ref 0.1–1.0)
Monocytes Relative: 8 %
Neutro Abs: 5.4 10*3/uL (ref 1.7–7.7)
Neutrophils Relative %: 71 %
Platelets: 375 10*3/uL (ref 150–400)
RBC: 3.33 MIL/uL — ABNORMAL LOW (ref 3.87–5.11)
RDW: 20.4 % — ABNORMAL HIGH (ref 11.5–15.5)
WBC: 7.6 10*3/uL (ref 4.0–10.5)
nRBC: 0.3 % — ABNORMAL HIGH (ref 0.0–0.2)

## 2023-05-23 LAB — PHOSPHORUS: Phosphorus: 3.5 mg/dL (ref 2.5–4.6)

## 2023-05-23 MED ORDER — VITAMIN A 3 MG (10000 UNIT) PO CAPS
10000.0000 [IU] | ORAL_CAPSULE | Freq: Every day | ORAL | Status: DC
  Administered 2023-05-23 – 2023-05-30 (×8): 10000 [IU] via ORAL
  Filled 2023-05-23 (×8): qty 1

## 2023-05-23 NOTE — Progress Notes (Signed)
 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 Vonn Guard and Dr. Corky Diener Patient will need to push enteroscopy, if not successful, recommend balloon enteroscopy at tertiary care center Monitor CBC closely  Elizabeth Oz, MD [place] [facility], [facility] [address]  [address]  [place], [place] [address]  Main: [phone]  Fax: [phone] Pager: [phone]

## 2023-05-23 NOTE — H&P (View-Only) (Signed)
 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 Vonn Guard and Dr. Corky Diener Patient will need to push enteroscopy, if not successful, recommend balloon enteroscopy at tertiary care center Monitor CBC closely  Karma Oz, MD [place] [facility], [facility] [address]  [address]  [place], [place] [address]  Main: [phone]  Fax: [phone] Pager: [phone]

## 2023-05-23 NOTE — TOC Progression Note (Signed)
 Transition of Care [facility]) - Progression Note    Patient Details  Name: Elizabeth [name] MRN: [id number] Date of Birth: [birth year]  [facility]) CM/SW Contact  Elsie Halo, RN Phone Number: 05/23/2023, 1:06 PM  Clinical Narrative:     Medical w/u continues. Capsule endoscopy identified 2 areas of bleeding. Patient will need new insuarance auth for [facility] when medically ready.   TOC will continue to follow.  Expected Discharge Plan: Home w Home Health Services    Expected Discharge Plan and Services   Discharge Planning Services: CM Consult   Living arrangements for the past 2 months: Single Family Home                                       Social Determinants of Health (SDOH) Interventions SDOH Screenings   Food Insecurity: No Food Insecurity (05/17/2023)  Housing: Low Risk  (05/17/2023)  Transportation Needs: No Transportation Needs (05/17/2023)  Utilities: Not At Risk (05/17/2023)  Depression (PHQ2-9): High Risk (07/22/2022)  Financial Resource Strain: Low Risk  (08/20/2022)   Received from [facility]  Physical Activity: Insufficiently Active (08/06/2021)   Received from [facility], [facility]  Social Connections: Socially Isolated (05/17/2023)  Stress: No Stress Concern Present (08/27/2021)   Received from [facility], [facility]  Tobacco Use: Medium Risk (05/22/2023)    Readmission Risk Interventions     No data to display

## 2023-05-23 NOTE — Progress Notes (Signed)
 PT Cancellation Note  Patient Details Name: Elizabeth [name] MRN: [id number] DOB: June 27, 1950   Cancelled Treatment:    Reason Eval/Treat Not Completed: Other (comment): Chart Reviewed. Patient received supine in bed, patient eating meal with close friend at bedside. Patient request therapist come back at later time/time. Will re-attempt as appropriate.  Aneeka Bowden M Fairly, PT, DPT 05/23/23 1:46 PM

## 2023-05-23 NOTE — Progress Notes (Signed)
 PROGRESS NOTE    [name]  DGU:[id number] DOB: 1950-02-13 DOA: 05/17/2023 PCP: [facility]  Chief Complaint  Patient presents with   Fall   Diarrhea    Hospital Course:  Elizabeth [name] is a 73 year old female with hypothyroidism, GI bleeds, bipolar disorder, schizophrenia, panic disorder, depression, anxiety, anemia, fibromyalgia, breast cancer status post right mastectomy, right-sided lymphedema, heart failure with preserved EF, recent diagnosis of COVID-19  and extensive surgical history including duodenectomy, reimplantation of ampulla, gastrojejunostomy, right hemicolectomy with 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 On this admission she presents with diarrhea, dyspnea, generalized weakness for 2 weeks.  She also endorses dark stools and frequent falls at home.  Patient was found to be anemic to hemoglobin of 7.1.  She received 1 unit PRBC.  She underwent colonoscopy on 4/29 which revealed nonbleeding internal hemorrhoids and patent end-to-side ileo-colonic anastomosis  subjective: Discussed results of PillCam endoscopy with patient this morning.  She is frustrated at the idea of needing another procedure.  She plans to discuss with GI.  Objective: Vitals:   05/22/23 1740 05/22/23 2039 05/23/23 0433 05/23/23 0757  BP: 126/75 116/62 137/85 114/71  Pulse: (!) 101 97 (!) 107 99  Resp: 16 15 18 18   Temp: 98.9 F (37.2 C) 99.2 F (37.3 C) 98.1 F (36.7 C) 98.4 F (36.9 C)  TempSrc: Oral Oral    SpO2: 96% 95% 95% 95%  Weight:      Height:       No intake or output data in the 24 hours ending 05/23/23 1521  Filed Weights   05/21/23 0446 05/22/23 0500 05/22/23 0536  Weight: 48.4 kg 46.6 kg 46.6 kg    Examination: General exam: Appears calm and comfortable, NAD  Respiratory system: No work of breathing, symmetric chest wall expansion Cardiovascular system:  S1 & S2 heard, RRR.  Gastrointestinal system: Abdomen is nondistended, soft, diffusely tender to palpation Neuro: Alert and oriented. No focal neurological deficits. Extremities: Symmetric, expected ROM Skin: No rashes, lesions Psychiatry: Demonstrates appropriate judgement and insight. Mood & affect appropriate for situation.   Assessment & Plan:  Principal Problem:   Symptomatic anemia Active Problems:   Major depressive disorder, recurrent severe without psychotic features (HCC)   GI bleed   Weakness   Heart failure (HCC)   Hypothyroidism   History of essential hypertension   AKI (acute kidney injury) (HCC)   Acute on chronic heart failure with preserved ejection fraction (HFpEF) (HCC)   Protein-calorie malnutrition, severe   Angiodysplasia of small intestine, except duodenum with bleeding   Jejunal polyp    Acute blood loss anemia History of chronic iron deficiency anemia - Status post 1 unit PRBC on 4/26 for hemoglobin 7.1 - Continue monitor hemoglobin, currently stable above 9 - Iron studies: Iron 47, TIBC 122, sat ratio 39, ferritin 830.  No evidence of iron deficiency at this time - B12: 625 - Colonoscopy mostly unremarkable 4/29 - EGD 4/30: Normal esophagus, multiple gastric polyps which were biopsied, no obvious source of bleeding.  -- Capsule endoscopy:  2 areas in proximal jejunum with stigmata of recent bleeding likely representing angiodysplasias as well as a 1 cm sessile polyp with clean-based ulceration just distal to the proximal jejunum. - GI now planning for push enteroscopy.  May require balloon enteroscopy at tertiary care center if needed - Appreciate GI recs - GI endorses concerns with iron absorption abilities.  May require  IV iron in the future.  AKI - Likely prerenal given diarrhea - Baseline 0.9, up to 1.32.  Has resolved now - Continue to follow CMP, will add additional IV fluids as needed - Continue to encourage oral intake - Avoid nephrotoxic  meds  Hypokalemia - Replace as needed  Vitamin A  deficiency - Secondary to patient's extensive bowel surgeries, poor absorption. - Have discussed with RD and pharmacy.  Will initiate supplementation.  Elevated vitamin D  - Patient was taking high-dose vitamin D  prior to admission - Discontinue at discharge  Generalized weakness - PT recommending discharge to SNF  Bipolar disorder Schizophrenia Anxiety Depression - Resume home meds - Frequent reorientation  History of breast cancer status post mastectomy - Mastectomy February 2023 - On letrozole  at home - Chronic lymphedema.  Acute on chronic diastolic heart failure - Echo with normal EF, grade 1 diastolic dysfunction, normal pulmonary pressures. - Cardiology evaluated patient during this admission for clearance for colonoscopy. - Resume home meds, GDMT as BP tolerates.  Hypothyroidism - TSH 5.26 - Takes 88 mcg levothyroxine  at home, have increased to 100 mcg - Needs outpatient follow-up for recheck TSH in 4 to 6 weeks  Hypoalbuminemia - Suspect dietary deficiencies.  RD consult  Extensive abdominal surgical history - Duodenectomy, reimplantation of ampulla, gastrojejunostomy, right hemicolectomy with ileocolonic anastomosis due to large duodenal diverticulum that was complicated by anastomotic leak managed with drains in 2018 - With subsequent vitamin deficiencies suggesting malabsorption. - High risk for SBO given presumed adhesions - Monitor closely.  Continues to have bowel movements  DVT prophylaxis: SCDs   Code Status: Full Code Disposition: GI now pending plans for enteroscopy.  Does have SNF bed, not yet medically cleared.  Consultants:  Treatment Team:  Consulting Physician: Shane Darling, MD Consulting Physician: [name], MD Consulting Physician: Cassie Click, MD  Procedures:  Colonoscopy  Antimicrobials:  Anti-infectives (From admission, onward)    None       Data Reviewed:  I have personally reviewed following labs and imaging studies CBC: Recent Labs  Lab 05/19/23 0435 05/20/23 0420 05/21/23 0404 05/22/23 0613 05/23/23 0420  WBC 6.3 9.0 7.3 6.7 7.6  NEUTROABS  --  7.7 5.7 5.0 5.4  HGB 9.6* 9.8* 8.9* 9.3* 9.5*  HCT 28.4* 29.2* 27.0* 29.1* 29.7*  MCV 84.3 85.4 87.9 89.8 89.2  PLT 391 402* 343 332 375   Basic Metabolic Panel: Recent Labs  Lab 05/18/23 0319 05/18/23 0415 05/19/23 0436 05/20/23 0420 05/21/23 0404 05/22/23 0613 05/23/23 0420  NA 141  --  140 135 141 138 135  K 3.0*  --  3.9 4.3 4.3 4.2 3.9  CL 106  --  102 100 108 106 103  CO2 24  --  26 23 24 23 22   GLUCOSE 89  --  90 97 91 100* 91  BUN 25*  --  24* 26* 22 23 24*  CREATININE 0.79  --  1.05* 1.32* 1.06* 1.01* 0.95  CALCIUM  7.7*  --  8.0* 8.2* 8.1* 7.9* 8.1*  MG 2.0  --   --  1.8 2.0 2.0 2.1  PHOS  --  2.9 3.3  --  2.8 2.9 3.5   GFR: Estimated Creatinine Clearance: 38.4 mL/min (by C-G formula based on SCr of 0.95 mg/dL). Liver Function Tests: Recent Labs  Lab 05/17/23 1335 05/18/23 0319 05/19/23 0436 05/20/23 0420 05/22/23 0613 05/23/23 0420  AST 91* 84*  --  152* 99* 97*  ALT 27 24  --  28 23  25  ALKPHOS 281* 258*  --  297* 348* 345*  BILITOT 0.6 0.8  --  0.9 0.6 0.8  PROT 6.4* 6.2*  --  6.5 6.2* 6.3*  ALBUMIN  2.2* 2.0* 2.0* 2.2* 1.9* 2.2*   CBG: No results for input(s): "GLUCAP" in the last 168 hours.  Recent Results (from the past 240 hours)  C Difficile Quick Screen w PCR reflex     Status: None   Collection Time: 05/18/23  8:12 AM   Specimen: STOOL  Result Value Ref Range Status   C Diff antigen NEGATIVE NEGATIVE Final   C Diff toxin NEGATIVE NEGATIVE Final   C Diff interpretation No C. difficile detected.  Final    Comment: Performed at [facility], [address] [place], [place] [address]  Gastrointestinal Panel by PCR , Stool     Status: None   Collection Time: 05/18/23 11:00 AM   Specimen: Stool  Result Value Ref Range Status    Campylobacter species NOT DETECTED NOT DETECTED Final   Plesimonas shigelloides NOT DETECTED NOT DETECTED Final   Salmonella species NOT DETECTED NOT DETECTED Final   Yersinia enterocolitica NOT DETECTED NOT DETECTED Final   Vibrio species NOT DETECTED NOT DETECTED Final   Vibrio cholerae NOT DETECTED NOT DETECTED Final   Enteroaggregative E coli (EAEC) NOT DETECTED NOT DETECTED Final   Enteropathogenic E coli (EPEC) NOT DETECTED NOT DETECTED Final   Enterotoxigenic E coli (ETEC) NOT DETECTED NOT DETECTED Final   Shiga like toxin producing E coli (STEC) NOT DETECTED NOT DETECTED Final   Shigella/Enteroinvasive E coli (EIEC) NOT DETECTED NOT DETECTED Final   Cryptosporidium NOT DETECTED NOT DETECTED Final   Cyclospora cayetanensis NOT DETECTED NOT DETECTED Final   Entamoeba histolytica NOT DETECTED NOT DETECTED Final   Giardia lamblia NOT DETECTED NOT DETECTED Final   Adenovirus F40/41 NOT DETECTED NOT DETECTED Final   Astrovirus NOT DETECTED NOT DETECTED Final   Norovirus GI/GII NOT DETECTED NOT DETECTED Final   Rotavirus A NOT DETECTED NOT DETECTED Final   Sapovirus (I, II, IV, and V) NOT DETECTED NOT DETECTED Final    Comment: Performed at [facility], [address], [place], [place] [address]     Radiology Studies: No results found.  Scheduled Meds:  levothyroxine   100 mcg Oral QAC breakfast   pantoprazole   40 mg Oral BID   QUEtiapine   25 mg Oral QHS   sertraline   100 mg Oral QHS   sodium chloride  flush  3 mL Intravenous Q12H   vitamin A   10,000 Units Oral Daily   Continuous Infusions:   LOS: 0 days  MDM: Patient is high risk for one or more organ failure.  They necessitate ongoing hospitalization for continued IV therapies and subsequent lab monitoring. Total time spent interpreting labs and vitals, reviewing the medical record, coordinating care amongst consultants and care team members, directly assessing and discussing care with the patient and/or family: 55  min  Aubra Pappalardo, DO [facility]  To contact the attending physician between 7A-7P please use Epic Chat. To contact the covering physician during after hours 7P-7A, please review Amion.  05/23/2023, 3:21 PM   *This document has been created with the assistance of dictation software. Please excuse typographical errors. *

## 2023-05-23 NOTE — Plan of Care (Signed)
  Problem: Education: Goal: Knowledge of General Education information will improve Description: Including pain rating scale, medication(s)/side effects and non-pharmacologic comfort measures Outcome: Progressing   Problem: Health Behavior/Discharge Planning: Goal: Ability to manage health-related needs will improve Outcome: Progressing   Problem: Clinical Measurements: Goal: Ability to maintain clinical measurements within normal limits will improve Outcome: Progressing   Problem: Activity: Goal: Risk for activity intolerance will decrease Outcome: Progressing   Problem: Nutrition: Goal: Adequate nutrition will be maintained Outcome: Progressing   Problem: Elimination: Goal: Will not experience complications related to bowel motility Outcome: Progressing   Problem: Skin Integrity: Goal: Risk for impaired skin integrity will decrease Outcome: Progressing   Problem: Education: Goal: Ability to demonstrate management of disease process will improve Outcome: Progressing   Problem: Activity: Goal: Capacity to carry out activities will improve Outcome: Progressing   Problem: Cardiac: Goal: Ability to achieve and maintain adequate cardiopulmonary perfusion will improve Outcome: Progressing

## 2023-05-23 NOTE — Plan of Care (Signed)
  Problem: Education: Goal: Knowledge of General Education information will improve Description: Including pain rating scale, medication(s)/side effects and non-pharmacologic comfort measures Outcome: Progressing   Problem: Health Behavior/Discharge Planning: Goal: Ability to manage health-related needs will improve Outcome: Progressing   Problem: Clinical Measurements: Goal: Ability to maintain clinical measurements within normal limits will improve Outcome: Progressing   Problem: Activity: Goal: Risk for activity intolerance will decrease Outcome: Progressing   Problem: Nutrition: Goal: Adequate nutrition will be maintained Outcome: Progressing   Problem: Coping: Goal: Level of anxiety will decrease Outcome: Progressing   Problem: Pain Managment: Goal: General experience of comfort will improve and/or be controlled Outcome: Progressing   Problem: Safety: Goal: Ability to remain free from injury will improve Outcome: Progressing   Problem: Skin Integrity: Goal: Risk for impaired skin integrity will decrease Outcome: Progressing   Problem: Education: Goal: Ability to demonstrate management of disease process will improve Outcome: Progressing   Problem: Activity: Goal: Capacity to carry out activities will improve Outcome: Progressing   Problem: Cardiac: Goal: Ability to achieve and maintain adequate cardiopulmonary perfusion will improve Outcome: Progressing

## 2023-05-24 ENCOUNTER — Inpatient Hospital Stay: Admitting: Anesthesiology

## 2023-05-24 ENCOUNTER — Encounter
Admission: EM | Disposition: A | Discharge status: Home Health | Payer: Self-pay | Source: Home / Self Care | Attending: Family Medicine

## 2023-05-24 DIAGNOSIS — D649 Anemia, unspecified: Secondary | ICD-10-CM | POA: Diagnosis not present

## 2023-05-24 HISTORY — PX: ENTEROSCOPY: SHX5533

## 2023-05-24 LAB — COMPREHENSIVE METABOLIC PANEL WITH GFR
ALT: 24 U/L (ref 0–44)
AST: 88 U/L — ABNORMAL HIGH (ref 15–41)
Albumin: 2 g/dL — ABNORMAL LOW (ref 3.5–5.0)
Alkaline Phosphatase: 341 U/L — ABNORMAL HIGH (ref 38–126)
Anion gap: 10 (ref 5–15)
BUN: 24 mg/dL — ABNORMAL HIGH (ref 8–23)
CO2: 22 mmol/L (ref 22–32)
Calcium: 8.2 mg/dL — ABNORMAL LOW (ref 8.9–10.3)
Chloride: 103 mmol/L (ref 98–111)
Creatinine, Ser: 0.92 mg/dL (ref 0.44–1.00)
GFR, Estimated: >60 mL/min (ref >60)
Glucose, Bld: 124 mg/dL — ABNORMAL HIGH (ref 70–99)
Potassium: 4 mmol/L (ref 3.5–5.1)
Sodium: 135 mmol/L (ref 135–145)
Total Bilirubin: 0.5 mg/dL (ref 0.0–1.2)
Total Protein: 6.3 g/dL — ABNORMAL LOW (ref 6.5–8.1)

## 2023-05-24 LAB — CBC WITH DIFFERENTIAL/PLATELET
Abs Immature Granulocytes: 0.43 10*3/uL — ABNORMAL HIGH (ref 0.00–0.07)
Basophils Absolute: 0.1 10*3/uL (ref 0.0–0.1)
Basophils Relative: 1 %
Eosinophils Absolute: 0.3 10*3/uL (ref 0.0–0.5)
Eosinophils Relative: 4 %
HCT: 29.4 % — ABNORMAL LOW (ref 36.0–46.0)
Hemoglobin: 9.4 g/dL — ABNORMAL LOW (ref 12.0–15.0)
Immature Granulocytes: 5 %
Lymphocytes Relative: 13 %
Lymphs Abs: 1 10*3/uL (ref 0.7–4.0)
MCH: 28.5 pg (ref 26.0–34.0)
MCHC: 32 g/dL (ref 30.0–36.0)
MCV: 89.1 fL (ref 80.0–100.0)
Monocytes Absolute: 0.7 10*3/uL (ref 0.1–1.0)
Monocytes Relative: 9 %
Neutro Abs: 5.5 10*3/uL (ref 1.7–7.7)
Neutrophils Relative %: 68 %
Platelets: 370 10*3/uL (ref 150–400)
RBC: 3.3 MIL/uL — ABNORMAL LOW (ref 3.87–5.11)
RDW: 20.4 % — ABNORMAL HIGH (ref 11.5–15.5)
WBC: 8.1 10*3/uL (ref 4.0–10.5)
nRBC: 0.2 % (ref 0.0–0.2)

## 2023-05-24 LAB — PHOSPHORUS: Phosphorus: 3.1 mg/dL (ref 2.5–4.6)

## 2023-05-24 LAB — MAGNESIUM: Magnesium: 2 mg/dL (ref 1.7–2.4)

## 2023-05-24 SURGERY — ENTEROSCOPY
Anesthesia: General

## 2023-05-24 MED ORDER — LIDOCAINE HCL (CARDIAC) PF 100 MG/5ML IV SOSY
PREFILLED_SYRINGE | INTRAVENOUS | Status: DC | PRN
  Administered 2023-05-24: 60 mg via INTRAVENOUS

## 2023-05-24 MED ORDER — SODIUM CHLORIDE 0.9 % IV SOLN
INTRAVENOUS | Status: DC
  Administered 2023-05-24: 20 mL/h via INTRAVENOUS

## 2023-05-24 MED ORDER — PROPOFOL 10 MG/ML IV BOLUS
INTRAVENOUS | Status: DC | PRN
  Administered 2023-05-24: 60 mg via INTRAVENOUS

## 2023-05-24 MED ORDER — DEXMEDETOMIDINE HCL IN NACL 80 MCG/20ML IV SOLN
INTRAVENOUS | Status: DC | PRN
  Administered 2023-05-24: 12 ug via INTRAVENOUS

## 2023-05-24 MED ORDER — PROPOFOL 500 MG/50ML IV EMUL
INTRAVENOUS | Status: DC | PRN
  Administered 2023-05-24: 100 ug/kg/min via INTRAVENOUS

## 2023-05-24 NOTE — Transfer of Care (Signed)
 Immediate Anesthesia Transfer of Care Note  Patient: Elizabeth [name]  Procedure(s) Performed: ENTEROSCOPY  Patient Location: PACU  Anesthesia Type:General  Level of Consciousness: awake, alert , and oriented  Airway & Oxygen Therapy: Patient Spontanous Breathing  Post-op Assessment: Report given to RN and Post -op Vital signs reviewed and stable  Post vital signs: Reviewed and stable  Last Vitals:  Vitals Value Taken Time  BP 115/64 05/24/23 0926  Temp 36.8 C 05/24/23 0856  Pulse 90 05/24/23 0930  Resp 17 05/24/23 0930  SpO2 95 % 05/24/23 0930  Vitals shown include unfiled device data.  Last Pain:  Vitals:   05/24/23 0947  TempSrc: Oral  PainSc:       Patients Stated Pain Goal: 0 (05/17/23 2000)  Complications: No notable events documented.

## 2023-05-24 NOTE — Anesthesia Postprocedure Evaluation (Signed)
 Anesthesia Post Note  Patient: Elizabeth [name]  Procedure(s) Performed: ENTEROSCOPY  Patient location during evaluation: Endoscopy Anesthesia Type: General Level of consciousness: awake and alert Pain management: pain level controlled Vital Signs Assessment: post-procedure vital signs reviewed and stable Respiratory status: spontaneous breathing, nonlabored ventilation, respiratory function stable and patient connected to nasal cannula oxygen Cardiovascular status: blood pressure returned to baseline and stable Postop Assessment: no apparent nausea or vomiting Anesthetic complications: no   No notable events documented.   Last Vitals:  Vitals:   05/24/23 0926 05/24/23 0947  BP: 115/64 107/66  Pulse: 91 91  Resp: 14   Temp:  (!) 36.3 C  SpO2: 95% 95%    Last Pain:  Vitals:   05/24/23 0947  TempSrc: Oral  PainSc:                  [name]

## 2023-05-24 NOTE — Anesthesia Preprocedure Evaluation (Signed)
 Anesthesia Evaluation  Patient identified by MRN, date of birth, ID band Patient awake    Reviewed: Allergy & Precautions, NPO status , Patient's Chart, lab work & pertinent test results  History of Anesthesia Complications Negative for: history of anesthetic complications  Airway Mallampati: III  TM Distance: >3 FB Neck ROM: full    Dental no notable dental hx.    Pulmonary shortness of breath, former smoker   Pulmonary exam normal        Cardiovascular +CHF  Normal cardiovascular exam  Echo 2025 IMPRESSIONS     1. Left ventricular ejection fraction, by estimation, is 55 to 60%. The  left ventricle has normal function. The left ventricle has no regional  wall motion abnormalities. Left ventricular diastolic parameters are  consistent with Grade I diastolic  dysfunction (impaired relaxation). The average left ventricular global  longitudinal strain is -17.4 %. The global longitudinal strain is normal.   2. Right ventricular systolic function is normal. The right ventricular  size is normal. There is normal pulmonary artery systolic pressure. The  estimated right ventricular systolic pressure is 23.4 mmHg.   3. The mitral valve is normal in structure. Mild mitral valve  regurgitation. No evidence of mitral stenosis.   4. The aortic valve is normal in structure. Aortic valve regurgitation is  mild. Aortic valve sclerosis is present, with no evidence of aortic valve  stenosis.   5. The inferior vena cava is normal in size with greater than 50%  respiratory variability, suggesting right atrial pressure of 3 mmHg.     Neuro/Psych  PSYCHIATRIC DISORDERS Anxiety Depression Bipolar Disorder Schizophrenia Dementia negative neurological ROS     GI/Hepatic negative GI ROS, Neg liver ROS,,,  Endo/Other  Hypothyroidism    Renal/GU Renal disease  negative genitourinary   Musculoskeletal  (+) Arthritis ,  Fibromyalgia -   Abdominal   Peds  Hematology  (+) Blood dyscrasia, anemia   Anesthesia Other Findings Past Medical History: 2016: Acute cholecystitis No date: Anemia 2016: Anemia No date: Anxiety     Comment:  panic attacks No date: Anxiety and depression No date: Arthritis No date: Bipolar 1 disorder (HCC) 2017: Bowel obstruction (HCC) 2015: Common bile duct dilatation No date: Dementia (HCC) No date: Dyspnea No date: Edema No date: Fibromyalgia 2017: GI bleed 01/21/2009: History of kidney stones 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 No date: Hyperglycemia No date: Hyperlipidemia No date: Hypokalemia No date: Hypothyroidism No date: Liver cyst No date: Malignant neoplasm of right female breast, unspecified  estrogen receptor status, unspecified site of breast (HCC) No date: Memory loss No date: Osteopenia No date: Panic disorder No date: Schizophrenia (HCC) No date: Ventral hernia  Past Surgical History: No date: ABDOMINAL HYSTERECTOMY No date: APPENDECTOMY 01/19/2021: BREAST BIOPSY; Right     Comment:  US  Bx, ribbon, path pending 01/19/2021: BREAST BIOPSY; Right     Comment:  US  Bx, Venus, path pending 01/19/2021: BREAST BIOPSY; Right     Comment:  US  Bx, heart, path pending 01/19/2021: BREAST BIOPSY; Right     Comment:  US  BX Axilla, Coil, path pending No date: CESAREAN SECTION 11/17/2013: CHOLECYSTECTOMY OPEN     Comment:  with repair of bile duct-Dr. Adele Admire 05/20/2023: COLONOSCOPY; N/A     Comment:  Procedure: COLONOSCOPY;  Surgeon: [name], MD;                Location: [facility] ENDOSCOPY;  Service: Endoscopy;  Laterality: N/A; 05/22/2023: ESOPHAGOGASTRODUODENOSCOPY; N/A     Comment:  Procedure: EGD (ESOPHAGOGASTRODUODENOSCOPY);  Surgeon:               [name], MD;  Location: [facility] ENDOSCOPY;  Service:               Endoscopy;  Laterality: N/A; 05/22/2023: GIVENS CAPSULE STUDY     Comment:  Procedure: IMAGING PROCEDURE, GI TRACT, INTRALUMINAL,               VIA  CAPSULE;  Surgeon: [name], MD;  Location: [facility]               ENDOSCOPY;  Service: Endoscopy;; 03/02/2021: MASTECTOMY MODIFIED RADICAL; Right     Comment:  Procedure: MASTECTOMY MODIFIED RADICAL;  Surgeon: Conrado Delay, DO;  Location: [facility] ORS;  Service: General;                Laterality: Right; 12/20/2014: OPEN REDUCTION INTERNAL FIXATION (ORIF) DISTAL RADIAL  FRACTURE; Right     Comment:  Procedure: OPEN REDUCTION INTERNAL FIXATION (ORIF) RIGHT              DISTAL RADIAL FRACTURE;  Surgeon: Saundra Curl, MD;                Location: [facility];  Service:               Orthopedics;  Laterality: Right; No date: STOMACH SURGERY  BMI    Body Mass Index: 20.06 kg/m      Reproductive/Obstetrics negative OB ROS                              Anesthesia Physical Anesthesia Plan  ASA: 3  Anesthesia Plan: General   Post-op Pain Management: Minimal or no pain anticipated   Induction: Intravenous  PONV Risk Score and Plan: 2 and Propofol  infusion and TIVA  Airway Management Planned: Natural Airway and Nasal Cannula  Additional Equipment:   Intra-op Plan:   Post-operative Plan:   Informed Consent: I have reviewed the patients History and Physical, chart, labs and discussed the procedure including the risks, benefits and alternatives for the proposed anesthesia with the patient or authorized representative who has indicated his/her understanding and acceptance.     Dental Advisory Given  Plan Discussed with: Anesthesiologist, CRNA and Surgeon  Anesthesia Plan Comments: (Patient consented for risks of anesthesia including but not limited to:  - adverse reactions to medications - risk of airway placement if required - damage to eyes, teeth, lips or other oral mucosa - nerve damage due to positioning  - sore throat or hoarseness - Damage to heart, brain, nerves, lungs, other parts of body or loss of life  Patient  voiced understanding and assent.)         Anesthesia Quick Evaluation

## 2023-05-24 NOTE — Progress Notes (Signed)
 PROGRESS NOTE    KATERRA CIUFO  ZOX:[id number] DOB: 1950/09/13 DOA: 05/17/2023 PCP: [facility]  Chief Complaint  Patient presents with   Fall   Diarrhea    Hospital Course:  Elizabeth [name] is a 73 year old female with hypothyroidism, GI bleeds, bipolar disorder, schizophrenia, panic disorder, depression, anxiety, anemia, fibromyalgia, breast cancer status post right mastectomy, right-sided lymphedema, heart failure with preserved EF, recent diagnosis of COVID-19  and extensive surgical history including duodenectomy, reimplantation of ampulla, gastrojejunostomy, right hemicolectomy with 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 On this admission she presents with diarrhea, dyspnea, generalized weakness for 2 weeks.  She also endorses dark stools and frequent falls at home.  Patient was found to be anemic to hemoglobin of 7.1.  She received 1 unit PRBC.  She underwent colonoscopy on 4/29 which revealed nonbleeding internal hemorrhoids and patent end-to-side ileo-colonic anastomosis  subjective: Patient evaluated after enteroscopy this morning.  She is frustrated about the plans to go to rehab.  She also complains that she is so weak she cannot move on her own.  We discussed the importance of physical therapy and Occupational Therapy.  Her boyfriend is at bedside and agrees with current plan.  Her son [name] has also been updated  Objective: Vitals:   05/24/23 0856 05/24/23 0900 05/24/23 0926 05/24/23 0947  BP:   115/64 107/66  Pulse:   91 91  Resp:   14   Temp: 98.2 F (36.8 C)   (!) 97.3 F (36.3 C)  TempSrc: Temporal   Oral  SpO2:  92% 95% 95%  Weight:      Height:        Intake/Output Summary (Last 24 hours) at 05/24/2023 1406 Last data filed at 05/24/2023 0819 Gross per 24 hour  Intake 120 ml  Output --  Net 120 ml    Filed Weights   05/21/23 0446 05/22/23 0500  05/22/23 0536  Weight: 48.4 kg 46.6 kg 46.6 kg    Examination: General exam: Appears calm and comfortable, NAD  Respiratory system: No work of breathing, symmetric chest wall expansion Cardiovascular system: S1 & S2 heard, RRR.  Gastrointestinal system: Abdomen is nondistended, soft, diffusely tender to palpation Neuro: Alert and oriented. No focal neurological deficits. Extremities: Symmetric, expected ROM Skin: No rashes, lesions Psychiatry: Demonstrates appropriate judgement and insight. Mood & affect appropriate for situation.   Assessment & Plan:  Principal Problem:   Symptomatic anemia Active Problems:   Major depressive disorder, recurrent severe without psychotic features (HCC)   GI bleed   Weakness   Heart failure (HCC)   Hypothyroidism   History of essential hypertension   AKI (acute kidney injury) (HCC)   Acute on chronic heart failure with preserved ejection fraction (HFpEF) (HCC)   Protein-calorie malnutrition, severe   Angiodysplasia of small intestine, except duodenum with bleeding   Jejunal polyp    Acute blood loss anemia History of chronic iron deficiency anemia - Status post 1 unit PRBC on 4/26 for hemoglobin 7.1 - Iron studies: Iron 47, TIBC 122, sat ratio 39, ferritin 830.  No evidence of iron deficiency at this time - B12: 625 - Small bowel enteroscopy 5/3: Pedunculated polypoid mass in jejunum, biopsied.  Will follow biopsies.  No AVMs appreciated.  No bleeding throughout enteroscopy. - Colonoscopy 4/29: Within normal limits - EGD 4/30: Normal esophagus, multiple gastric polyps which were biopsied, no obvious source of bleeding.  -- Capsule  endoscopy:  2 areas in proximal jejunum with stigmata of recent bleeding likely representing angiodysplasias as well as a 1 cm sessile polyp with clean-based ulceration just distal to the proximal jejunum. - Advancing diet today - Continue to monitor hemoglobin, if it remains stable above 9 we will proceed with  discharge and outpatient follow-up for biopsy results.  AKI - Likely prerenal given diarrhea - Baseline 0.9, up to 1.32.  Has resolved now - Continue to follow CMP.  IV fluids as needed - Encourage oral intake - Avoid nephrotoxic medications - Renally dosed creatinine clearance of 39 when needed  Hypokalemia - Replace as needed  Vitamin A  deficiency - Secondary to patient's extensive bowel surgeries, poor absorption. - Have discussed with RD and pharmacy.  Continue with current supplementation  Elevated vitamin D  - Patient was taking high-dose vitamin D  prior to admission - Discontinue at discharge  Generalized weakness - PT recommending discharge to SNF  Bipolar disorder Schizophrenia Anxiety Depression - Resume home meds - Frequent reorientation  History of breast cancer status post mastectomy - Mastectomy February 2023 - On letrozole  at home - Chronic lymphedema.  Acute on chronic diastolic heart failure - Echo with normal EF, grade 1 diastolic dysfunction, normal pulmonary pressures. - Cardiology evaluated patient during this admission for clearance for colonoscopy. - Resume home meds, GDMT as BP tolerates.  Hypothyroidism - TSH 5.26 - Takes 88 mcg levothyroxine  at home, have increased to 100 mcg - Needs outpatient follow-up for recheck TSH in 4 to 6 weeks  Hypoalbuminemia - Suspect dietary deficiencies.  RD consult  Extensive abdominal surgical history - Duodenectomy, reimplantation of ampulla, gastrojejunostomy, right hemicolectomy with ileocolonic anastomosis due to large duodenal diverticulum that was complicated by anastomotic leak managed with drains in 2018 - With subsequent vitamin deficiencies suggesting malabsorption. - High risk for SBO given presumed adhesions - Monitor closely, continues to have bowel movements. - High risk for electrolyte abnormalities.  Vitamin A  and vitamin D  as above.   DVT prophylaxis: SCDs   Code Status: Full  Code Disposition: Trending hemoglobin.  If remains stable will likely discharge to SNF tomorrow.  Consultants:  Treatment Team:  Consulting Physician: Shane Darling, MD Consulting Physician: [name], MD Consulting Physician: Cassie Click, MD  Procedures:  Colonoscopy  Antimicrobials:  Anti-infectives (From admission, onward)    None       Data Reviewed: I have personally reviewed following labs and imaging studies CBC: Recent Labs  Lab 05/20/23 0420 05/21/23 0404 05/22/23 0613 05/23/23 0420 05/24/23 0355  WBC 9.0 7.3 6.7 7.6 8.1  NEUTROABS 7.7 5.7 5.0 5.4 5.5  HGB 9.8* 8.9* 9.3* 9.5* 9.4*  HCT 29.2* 27.0* 29.1* 29.7* 29.4*  MCV 85.4 87.9 89.8 89.2 89.1  PLT 402* 343 332 375 370   Basic Metabolic Panel: Recent Labs  Lab 05/19/23 0436 05/20/23 0420 05/21/23 0404 05/22/23 0613 05/23/23 0420 05/24/23 0355  NA 140 135 141 138 135 135  K 3.9 4.3 4.3 4.2 3.9 4.0  CL 102 100 108 106 103 103  CO2 26 23 24 23 22 22   GLUCOSE 90 97 91 100* 91 124*  BUN 24* 26* 22 23 24* 24*  CREATININE 1.05* 1.32* 1.06* 1.01* 0.95 0.92  CALCIUM  8.0* 8.2* 8.1* 7.9* 8.1* 8.2*  MG  --  1.8 2.0 2.0 2.1 2.0  PHOS 3.3  --  2.8 2.9 3.5 3.1   GFR: Estimated Creatinine Clearance: 39.7 mL/min (by C-G formula based on SCr of 0.92 mg/dL). Liver Function  Tests: Recent Labs  Lab 05/18/23 0319 05/19/23 0436 05/20/23 0420 05/22/23 0613 05/23/23 0420 05/24/23 0355  AST 84*  --  152* 99* 97* 88*  ALT 24  --  28 23 25 24   ALKPHOS 258*  --  297* 348* 345* 341*  BILITOT 0.8  --  0.9 0.6 0.8 0.5  PROT 6.2*  --  6.5 6.2* 6.3* 6.3*  ALBUMIN  2.0* 2.0* 2.2* 1.9* 2.2* 2.0*   CBG: No results for input(s): "GLUCAP" in the last 168 hours.  Recent Results (from the past 240 hours)  C Difficile Quick Screen w PCR reflex     Status: None   Collection Time: 05/18/23  8:12 AM   Specimen: STOOL  Result Value Ref Range Status   C Diff antigen NEGATIVE NEGATIVE Final   C Diff toxin  NEGATIVE NEGATIVE Final   C Diff interpretation No C. difficile detected.  Final    Comment: Performed at [facility], [address] [place], [place] [address]  Gastrointestinal Panel by PCR , Stool     Status: None   Collection Time: 05/18/23 11:00 AM   Specimen: Stool  Result Value Ref Range Status   Campylobacter species NOT DETECTED NOT DETECTED Final   Plesimonas shigelloides NOT DETECTED NOT DETECTED Final   Salmonella species NOT DETECTED NOT DETECTED Final   Yersinia enterocolitica NOT DETECTED NOT DETECTED Final   Vibrio species NOT DETECTED NOT DETECTED Final   Vibrio cholerae NOT DETECTED NOT DETECTED Final   Enteroaggregative E coli (EAEC) NOT DETECTED NOT DETECTED Final   Enteropathogenic E coli (EPEC) NOT DETECTED NOT DETECTED Final   Enterotoxigenic E coli (ETEC) NOT DETECTED NOT DETECTED Final   Shiga like toxin producing E coli (STEC) NOT DETECTED NOT DETECTED Final   Shigella/Enteroinvasive E coli (EIEC) NOT DETECTED NOT DETECTED Final   Cryptosporidium NOT DETECTED NOT DETECTED Final   Cyclospora cayetanensis NOT DETECTED NOT DETECTED Final   Entamoeba histolytica NOT DETECTED NOT DETECTED Final   Giardia lamblia NOT DETECTED NOT DETECTED Final   Adenovirus F40/41 NOT DETECTED NOT DETECTED Final   Astrovirus NOT DETECTED NOT DETECTED Final   Norovirus GI/GII NOT DETECTED NOT DETECTED Final   Rotavirus A NOT DETECTED NOT DETECTED Final   Sapovirus (I, II, IV, and V) NOT DETECTED NOT DETECTED Final    Comment: Performed at [facility], [address] [place], [place] [address]     Radiology Studies: No results found.  Scheduled Meds:  levothyroxine   100 mcg Oral QAC breakfast   pantoprazole   40 mg Oral BID   QUEtiapine   25 mg Oral QHS   sertraline   100 mg Oral QHS   sodium chloride  flush  3 mL Intravenous Q12H   vitamin A   10,000 Units Oral Daily   Continuous Infusions:   LOS: 1 day  MDM: Patient is high risk for one or more organ  failure.  They necessitate ongoing hospitalization for continued IV therapies and subsequent lab monitoring. Total time spent interpreting labs and vitals, reviewing the medical record, coordinating care amongst consultants and care team members, directly assessing and discussing care with the patient and/or family: 55 min  Marquette Piontek, DO [facility]  To contact the attending physician between 7A-7P please use Epic Chat. To contact the covering physician during after hours 7P-7A, please review Amion.  05/24/2023, 2:06 PM   *This document has been created with the assistance of dictation software. Please excuse typographical errors. *

## 2023-05-24 NOTE — Interval H&P Note (Signed)
 History and Physical Interval Note:  05/24/2023 8:19 AM  Elizabeth [name]  has presented today for surgery, with the diagnosis of IDA, abnormal capsule endoscopy of small intestine, AVMs.  The various methods of treatment have been discussed with the patient and family. After consideration of risks, benefits and other options for treatment, the patient has consented to  Procedure(s): ENTEROSCOPY (N/A) as a surgical intervention.  The patient's history has been reviewed, patient examined, no change in status, stable for surgery.  I have reviewed the patient's chart and labs.  Questions were answered to the patient's satisfaction.     Harriman, Nayra Coury

## 2023-05-24 NOTE — Op Note (Signed)
 [facility]astroenterology Patient Name: Elizabeth [name] Procedure Date: 05/24/2023 7:37 AM MRN: [id number] Account #: [id number] Date of Birth: [birth year] Admit Type: Inpatient Age: 73 Room: [facility] ENDO ROOM 4 Gender: Female Note Status: Finalized Instrument Name: Peds Colonoscope [id number] Procedure:             Small bowel enteroscopy Indications:           Arteriovenous malformation in the small intestine Providers:             Jamyson Jirak K. Corky Diener MD, MD Referring MD:          [facility] Primary Medicines:             Propofol  per Anesthesia Complications:         No immediate complications. Estimated blood loss:                         Minimal. Procedure:             Pre-Anesthesia Assessment:                        - The risks and benefits of the procedure and the                         sedation options and risks were discussed with the                         patient. All questions were answered and informed                         consent was obtained.                        - Patient identification and proposed procedure were                         verified prior to the procedure by the nurse. The                         procedure was verified in the procedure room.                        - ASA Grade Assessment: III - A patient with severe                         systemic disease.                        - After reviewing the risks and benefits, the patient                         was deemed in satisfactory condition to undergo the                         procedure.                        After obtaining informed consent, the endoscope was                         passed under direct  vision. Throughout the procedure,                         the patient's blood pressure, pulse, and oxygen                         saturations were monitored continuously. The                         Colonoscope was introduced through the mouth and                         advanced  to the mid-jejunum. The small bowel                         enteroscopy was somewhat difficult due to unusual                         anatomy. Successful completion of the procedure was                         aided by using scope torsion. The patient tolerated                         the procedure well. Findings:      The examined esophagus was normal.      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       This was characterized by congestion and erythema and polypoid changes       at the anastomosis. Biopsies were taken with a cold forceps for       histology. Estimated blood loss was minimal.      A 2 cm hiatal hernia was present.      A medium-sized polypoid mass with no bleeding was found in the proximal       jejunum. Biopsies were taken with a cold forceps for histology.       Estimated blood loss was minimal.      Exam of the jejunum was otherwise normal.      I was unable to localize any vascular malformations in the proximal       jejunum as dictated on wireless capsule endoscopy.      There was no evidence of active bleeding in the examined segments of the       upper GI tract/jejunum. Impression:            - Normal esophagus.                        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 Biopsied.                        - 2 cm hiatal hernia.                        -  Jejunal mass. Biopsied. Recommendation:        - Await pathology results.                        - Serial H/H, Serial exams                        - Advance diet as tolerated - advance as tolerated to                         resume previous diet today. Procedure Code(s):     --- Professional ---                        [phone], Small intestinal endoscopy, enteroscopy beyond                         second portion of duodenum, not including ileum; with                          biopsy, single or multiple CPT copyright 2022 [employer]. All rights reserved. The codes documented in this report are preliminary and upon coder review may  be revised to meet current compliance requirements. Cassie Click MD, MD 05/24/2023 9:05:56 AM This report has been signed electronically. Number of Addenda: 0 Note Initiated On: 05/24/2023 7:37 AM Estimated Blood Loss:  Estimated blood loss was minimal.      [facility]

## 2023-05-24 NOTE — Plan of Care (Signed)
  Problem: Education: Goal: Knowledge of General Education information will improve Description: Including pain rating scale, medication(s)/side effects and non-pharmacologic comfort measures Outcome: Progressing   Problem: Health Behavior/Discharge Planning: Goal: Ability to manage health-related needs will improve Outcome: Progressing   Problem: Clinical Measurements: Goal: Ability to maintain clinical measurements within normal limits will improve Outcome: Progressing   Problem: Activity: Goal: Risk for activity intolerance will decrease Outcome: Progressing   Problem: Nutrition: Goal: Adequate nutrition will be maintained Outcome: Progressing   Problem: Coping: Goal: Level of anxiety will decrease Outcome: Progressing   Problem: Elimination: Goal: Will not experience complications related to bowel motility Outcome: Progressing   Problem: Pain Managment: Goal: General experience of comfort will improve and/or be controlled Outcome: Progressing   Problem: Safety: Goal: Ability to remain free from injury will improve Outcome: Progressing   Problem: Education: Goal: Ability to demonstrate management of disease process will improve Outcome: Progressing   Problem: Activity: Goal: Capacity to carry out activities will improve Outcome: Progressing   Problem: Cardiac: Goal: Ability to achieve and maintain adequate cardiopulmonary perfusion will improve Outcome: Progressing

## 2023-05-25 DIAGNOSIS — F332 Major depressive disorder, recurrent severe without psychotic features: Secondary | ICD-10-CM | POA: Diagnosis not present

## 2023-05-25 DIAGNOSIS — R531 Weakness: Secondary | ICD-10-CM | POA: Diagnosis not present

## 2023-05-25 DIAGNOSIS — D649 Anemia, unspecified: Secondary | ICD-10-CM | POA: Diagnosis not present

## 2023-05-25 DIAGNOSIS — K922 Gastrointestinal hemorrhage, unspecified: Secondary | ICD-10-CM | POA: Diagnosis not present

## 2023-05-25 DIAGNOSIS — E43 Unspecified severe protein-calorie malnutrition: Secondary | ICD-10-CM

## 2023-05-25 DIAGNOSIS — K6389 Other specified diseases of intestine: Secondary | ICD-10-CM

## 2023-05-25 DIAGNOSIS — Z8679 Personal history of other diseases of the circulatory system: Secondary | ICD-10-CM

## 2023-05-25 DIAGNOSIS — N179 Acute kidney failure, unspecified: Secondary | ICD-10-CM

## 2023-05-25 LAB — CBC WITH DIFFERENTIAL/PLATELET
Abs Immature Granulocytes: 0.42 10*3/uL — ABNORMAL HIGH (ref 0.00–0.07)
Basophils Absolute: 0.1 10*3/uL (ref 0.0–0.1)
Basophils Relative: 1 %
Eosinophils Absolute: 0.4 10*3/uL (ref 0.0–0.5)
Eosinophils Relative: 5 %
HCT: 29 % — ABNORMAL LOW (ref 36.0–46.0)
Hemoglobin: 9.2 g/dL — ABNORMAL LOW (ref 12.0–15.0)
Immature Granulocytes: 6 %
Lymphocytes Relative: 14 %
Lymphs Abs: 1 10*3/uL (ref 0.7–4.0)
MCH: 28.2 pg (ref 26.0–34.0)
MCHC: 31.7 g/dL (ref 30.0–36.0)
MCV: 89 fL (ref 80.0–100.0)
Monocytes Absolute: 0.6 10*3/uL (ref 0.1–1.0)
Monocytes Relative: 8 %
Neutro Abs: 5.1 10*3/uL (ref 1.7–7.7)
Neutrophils Relative %: 66 %
Platelets: 350 10*3/uL (ref 150–400)
RBC: 3.26 MIL/uL — ABNORMAL LOW (ref 3.87–5.11)
RDW: 20.3 % — ABNORMAL HIGH (ref 11.5–15.5)
Smear Review: NORMAL
WBC: 7.6 10*3/uL (ref 4.0–10.5)
nRBC: 0 % (ref 0.0–0.2)

## 2023-05-25 LAB — COMPREHENSIVE METABOLIC PANEL WITH GFR
ALT: 25 U/L (ref 0–44)
AST: 89 U/L — ABNORMAL HIGH (ref 15–41)
Albumin: 1.9 g/dL — ABNORMAL LOW (ref 3.5–5.0)
Alkaline Phosphatase: 336 U/L — ABNORMAL HIGH (ref 38–126)
Anion gap: 10 (ref 5–15)
BUN: 25 mg/dL — ABNORMAL HIGH (ref 8–23)
CO2: 24 mmol/L (ref 22–32)
Calcium: 8.5 mg/dL — ABNORMAL LOW (ref 8.9–10.3)
Chloride: 105 mmol/L (ref 98–111)
Creatinine, Ser: 0.88 mg/dL (ref 0.44–1.00)
GFR, Estimated: >60 mL/min (ref >60)
Glucose, Bld: 108 mg/dL — ABNORMAL HIGH (ref 70–99)
Potassium: 4.2 mmol/L (ref 3.5–5.1)
Sodium: 139 mmol/L (ref 135–145)
Total Bilirubin: 0.5 mg/dL (ref 0.0–1.2)
Total Protein: 6.1 g/dL — ABNORMAL LOW (ref 6.5–8.1)

## 2023-05-25 LAB — PHOSPHORUS: Phosphorus: 3.4 mg/dL (ref 2.5–4.6)

## 2023-05-25 LAB — MAGNESIUM: Magnesium: 2.1 mg/dL (ref 1.7–2.4)

## 2023-05-25 MED ORDER — ALUM & MAG HYDROXIDE-SIMETH 200-200-20 MG/5ML PO SUSP
15.0000 mL | Freq: Once | ORAL | Status: AC
  Administered 2023-05-25: 15 mL via ORAL
  Filled 2023-05-25: qty 30

## 2023-05-25 NOTE — Progress Notes (Signed)
 PROGRESS NOTE    Elizabeth [name]  UEA:[id number] DOB: 30-Mar-1950 DOA: 05/17/2023 PCP: [facility]  Chief Complaint  Patient presents with   Fall   Diarrhea    Hospital Course:  Elizabeth [name] is a 73 year old female with hypothyroidism, GI bleeds, bipolar disorder, schizophrenia, panic disorder, depression, anxiety, anemia, fibromyalgia, breast cancer status post right mastectomy, right-sided lymphedema, heart failure with preserved EF, recent diagnosis of COVID-19  and extensive surgical history including duodenectomy, reimplantation of ampulla, gastrojejunostomy, right hemicolectomy with 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 On this admission she presents with diarrhea, dyspnea, generalized weakness for 2 weeks.  She also endorses dark stools and frequent falls at home.  Patient was found to be anemic to hemoglobin of 7.1.  She received 1 unit PRBC.  She underwent colonoscopy on 4/29 which revealed nonbleeding internal hemorrhoids and patent end-to-side ileo-colonic anastomosis  subjective: No acute events overnight.  Patient has no abdominal pain this morning.  No issues reported.  Objective: Vitals:   05/25/23 0331 05/25/23 0541 05/25/23 0848 05/25/23 0924  BP: 129/62  109/65   Pulse: (!) 105  (!) 105 (!) 105  Resp: 14     Temp: 98.9 F (37.2 C)  (!) 95.5 F (35.3 C) 98.2 F (36.8 C)  TempSrc:    Oral  SpO2: 94%  96% 95%  Weight:  46 kg    Height:        Intake/Output Summary (Last 24 hours) at 05/25/2023 1442 Last data filed at 05/25/2023 1428 Gross per 24 hour  Intake 250 ml  Output --  Net 250 ml    Filed Weights   05/22/23 0500 05/22/23 0536 05/25/23 0541  Weight: 46.6 kg 46.6 kg 46 kg    Examination: General exam: Appears calm and comfortable, NAD  Respiratory system: No work of breathing, symmetric chest wall expansion Cardiovascular system: S1 &  S2 heard, RRR.  Gastrointestinal system: Abdomen is nondistended, soft, diffusely tender to palpation Neuro: Alert and oriented. No focal neurological deficits. Extremities: Symmetric, expected ROM Skin: No rashes, lesions Psychiatry: Demonstrates appropriate judgement and insight. Mood & affect appropriate for situation.   Assessment & Plan:  Principal Problem:   Symptomatic anemia Active Problems:   Major depressive disorder, recurrent severe without psychotic features (HCC)   GI bleed   Weakness   Heart failure (HCC)   Hypothyroidism   History of essential hypertension   AKI (acute kidney injury) (HCC)   Acute on chronic heart failure with preserved ejection fraction (HFpEF) (HCC)   Protein-calorie malnutrition, severe   Angiodysplasia of small intestine, except duodenum with bleeding   Jejunal polyp    Acute blood loss anemia History of chronic iron deficiency anemia - Status post 1 unit PRBC on 4/26 for hemoglobin 7.1 - Iron studies: Iron 47, TIBC 122, sat ratio 39, ferritin 830.  No evidence of iron deficiency at this time - B12: 625 - Small bowel enteroscopy 5/3: Pedunculated polypoid mass in jejunum, biopsied.  Will follow biopsies.  No AVMs appreciated.  No bleeding throughout enteroscopy. - Colonoscopy 4/29: Within normal limits - EGD 4/30: Normal esophagus, multiple gastric polyps which were biopsied, no obvious source of bleeding.  -- Capsule endoscopy:  2 areas in proximal jejunum with stigmata of recent bleeding likely representing angiodysplasias as well as a 1 cm sessile polyp with clean-based ulceration just distal to the proximal jejunum. - Patient is tolerating diet well.  No abdominal pain.  No nausea or vomiting - Continue to monitor hemoglobin, remained stable above 9 for now - Follow-up outpatient for biopsy results.  AKI - Likely prerenal given diarrhea - Baseline 0.9, up to 1.32.  Has resolved now - Continue to follow CMP - IV fluids as needed -  Encourage oral intake - Avoid nephrotoxic medications - Renally dosed with creatinine clearance of 41 when needed  Hypokalemia - Continue to follow CMP, replace as needed  Vitamin A  deficiency - Secondary to patient's extensive bowel surgeries, poor absorption. - Have discussed with RD and pharmacy.  Continue with current supplementation  Elevated vitamin D  - Patient was taking high-dose vitamin D  prior to admission - Discontinue at discharge  Generalized weakness - PT recommending discharge to SNF  Bipolar disorder Schizophrenia Anxiety Depression - Resume home meds - Frequent reorientation  History of breast cancer status post mastectomy - Mastectomy February 2023 - On letrozole  at home - Chronic lymphedema.  Acute on chronic diastolic heart failure - Echo with normal EF, grade 1 diastolic dysfunction, normal pulmonary pressures. - Cardiology evaluated patient during this admission for clearance for colonoscopy. - Resume home meds, GDMT as BP tolerates.  Hypothyroidism - TSH 5.26 - Takes 88 mcg levothyroxine  at home, have increased to 100 mcg - Needs outpatient follow-up for recheck TSH in 4 to 6 weeks  Hypoalbuminemia - Suspect dietary deficiencies.  RD consult  Extensive abdominal surgical history - Duodenectomy, reimplantation of ampulla, gastrojejunostomy, right hemicolectomy with ileocolonic anastomosis due to large duodenal diverticulum that was complicated by anastomotic leak managed with drains in 2018 - With subsequent vitamin deficiencies suggesting malabsorption. - High risk for SBO given presumed adhesions - Continue to monitor closely.  Is having regular bowel movements - High risk for electrolyte abnormalities, vitamin A  and vitamin D  as above.  Will continue to trend daily and replace as needed  DVT prophylaxis: SCDs   Code Status: Full Code Disposition: Medically cleared for discharge to skilled nursing facility.  Appears patient has lost her  authorization.  TOC is aware working on it  Consultants:  Treatment Team:  Consulting Physician: Shane Darling, MD Consulting Physician: [name], MD Consulting Physician: Cassie Click, MD  Procedures:  Colonoscopy  Antimicrobials:  Anti-infectives (From admission, onward)    None       Data Reviewed: I have personally reviewed following labs and imaging studies CBC: Recent Labs  Lab 05/21/23 0404 05/22/23 0613 05/23/23 0420 05/24/23 0355 05/25/23 0336  WBC 7.3 6.7 7.6 8.1 7.6  NEUTROABS 5.7 5.0 5.4 5.5 5.1  HGB 8.9* 9.3* 9.5* 9.4* 9.2*  HCT 27.0* 29.1* 29.7* 29.4* 29.0*  MCV 87.9 89.8 89.2 89.1 89.0  PLT 343 332 375 370 350   Basic Metabolic Panel: Recent Labs  Lab 05/21/23 0404 05/22/23 0613 05/23/23 0420 05/24/23 0355 05/25/23 0336  NA 141 138 135 135 139  K 4.3 4.2 3.9 4.0 4.2  CL 108 106 103 103 105  CO2 24 23 22 22 24   GLUCOSE 91 100* 91 124* 108*  BUN 22 23 24* 24* 25*  CREATININE 1.06* 1.01* 0.95 0.92 0.88  CALCIUM  8.1* 7.9* 8.1* 8.2* 8.5*  MG 2.0 2.0 2.1 2.0 2.1  PHOS 2.8 2.9 3.5 3.1 3.4   GFR: Estimated Creatinine Clearance: 41.5 mL/min (by C-G formula based on SCr of 0.88 mg/dL). Liver Function Tests: Recent Labs  Lab 05/20/23 0420 05/22/23 1324 05/23/23 0420 05/24/23 0355 05/25/23 0336  AST 152* 99* 97* 88* 89*  ALT 28 23 25 24 25   ALKPHOS 297* 348* 345* 341* 336*  BILITOT 0.9 0.6 0.8 0.5 0.5  PROT 6.5 6.2* 6.3* 6.3* 6.1*  ALBUMIN  2.2* 1.9* 2.2* 2.0* 1.9*   CBG: No results for input(s): "GLUCAP" in the last 168 hours.  Recent Results (from the past 240 hours)  C Difficile Quick Screen w PCR reflex     Status: None   Collection Time: 05/18/23  8:12 AM   Specimen: STOOL  Result Value Ref Range Status   C Diff antigen NEGATIVE NEGATIVE Final   C Diff toxin NEGATIVE NEGATIVE Final   C Diff interpretation No C. difficile detected.  Final    Comment: Performed at [facility], [address]  [place], [place] [address]  Gastrointestinal Panel by PCR , Stool     Status: None   Collection Time: 05/18/23 11:00 AM   Specimen: Stool  Result Value Ref Range Status   Campylobacter species NOT DETECTED NOT DETECTED Final   Plesimonas shigelloides NOT DETECTED NOT DETECTED Final   Salmonella species NOT DETECTED NOT DETECTED Final   Yersinia enterocolitica NOT DETECTED NOT DETECTED Final   Vibrio species NOT DETECTED NOT DETECTED Final   Vibrio cholerae NOT DETECTED NOT DETECTED Final   Enteroaggregative E coli (EAEC) NOT DETECTED NOT DETECTED Final   Enteropathogenic E coli (EPEC) NOT DETECTED NOT DETECTED Final   Enterotoxigenic E coli (ETEC) NOT DETECTED NOT DETECTED Final   Shiga like toxin producing E coli (STEC) NOT DETECTED NOT DETECTED Final   Shigella/Enteroinvasive E coli (EIEC) NOT DETECTED NOT DETECTED Final   Cryptosporidium NOT DETECTED NOT DETECTED Final   Cyclospora cayetanensis NOT DETECTED NOT DETECTED Final   Entamoeba histolytica NOT DETECTED NOT DETECTED Final   Giardia lamblia NOT DETECTED NOT DETECTED Final   Adenovirus F40/41 NOT DETECTED NOT DETECTED Final   Astrovirus NOT DETECTED NOT DETECTED Final   Norovirus GI/GII NOT DETECTED NOT DETECTED Final   Rotavirus A NOT DETECTED NOT DETECTED Final   Sapovirus (I, II, IV, and V) NOT DETECTED NOT DETECTED Final    Comment: Performed at [facility], [address] [place], [place] [address]     Radiology Studies: No results found.  Scheduled Meds:  levothyroxine   100 mcg Oral QAC breakfast   pantoprazole   40 mg Oral BID   QUEtiapine   25 mg Oral QHS   sertraline   100 mg Oral QHS   sodium chloride  flush  3 mL Intravenous Q12H   vitamin A   10,000 Units Oral Daily   Continuous Infusions:   LOS: 2 days  MDM: Patient is high risk for one or more organ failure.  They necessitate ongoing hospitalization for continued IV therapies and subsequent lab monitoring. Total time spent interpreting labs and  vitals, reviewing the medical record, coordinating care amongst consultants and care team members, directly assessing and discussing care with the patient and/or family: 55 min  Chanon Loney, DO [facility]  To contact the attending physician between 7A-7P please use Epic Chat. To contact the covering physician during after hours 7P-7A, please review Amion.  05/25/2023, 2:42 PM   *This document has been created with the assistance of dictation software. Please excuse typographical errors. *

## 2023-05-25 NOTE — Progress Notes (Signed)
 Mobility Specialist - Progress Note    05/25/23 1521  Mobility  Activity Ambulated with assistance in hallway  Level of Assistance Standby assist, set-up cues, supervision of patient - no hands on  Assistive Device Front wheel walker  Distance Ambulated (ft) 45 ft  Range of Motion/Exercises Active  Activity Response Tolerated well  Mobility Referral Yes  Mobility visit 1 Mobility  Mobility Specialist Start Time (ACUTE ONLY) 1500  Mobility Specialist Stop Time (ACUTE ONLY) 1521  Mobility Specialist Time Calculation (min) (ACUTE ONLY) 21 min   Pt resting in bed on RA upon entry. Pt STS and ambulates to hallway SBA with RW. Pt took x1 standing rest break before continuing ambulation. Pt returned to bed (sat EOB to recover breath) and left with needs in reach. Bed alarm activated  Jerri Morale Mobility Specialist 05/25/23, 5:07 PM

## 2023-05-26 ENCOUNTER — Encounter: Payer: Self-pay | Admitting: Internal Medicine

## 2023-05-26 DIAGNOSIS — R531 Weakness: Secondary | ICD-10-CM | POA: Diagnosis not present

## 2023-05-26 DIAGNOSIS — F332 Major depressive disorder, recurrent severe without psychotic features: Secondary | ICD-10-CM | POA: Diagnosis not present

## 2023-05-26 DIAGNOSIS — K922 Gastrointestinal hemorrhage, unspecified: Secondary | ICD-10-CM | POA: Diagnosis not present

## 2023-05-26 DIAGNOSIS — D649 Anemia, unspecified: Secondary | ICD-10-CM | POA: Diagnosis not present

## 2023-05-26 LAB — COMPREHENSIVE METABOLIC PANEL WITH GFR
ALT: 25 U/L (ref 0–44)
AST: 97 U/L — ABNORMAL HIGH (ref 15–41)
Albumin: 1.9 g/dL — ABNORMAL LOW (ref 3.5–5.0)
Alkaline Phosphatase: 321 U/L — ABNORMAL HIGH (ref 38–126)
Anion gap: 9 (ref 5–15)
BUN: 26 mg/dL — ABNORMAL HIGH (ref 8–23)
CO2: 25 mmol/L (ref 22–32)
Calcium: 8.4 mg/dL — ABNORMAL LOW (ref 8.9–10.3)
Chloride: 106 mmol/L (ref 98–111)
Creatinine, Ser: 0.93 mg/dL (ref 0.44–1.00)
GFR, Estimated: >60 mL/min (ref >60)
Glucose, Bld: 93 mg/dL (ref 70–99)
Potassium: 4.5 mmol/L (ref 3.5–5.1)
Sodium: 140 mmol/L (ref 135–145)
Total Bilirubin: 0.7 mg/dL (ref 0.0–1.2)
Total Protein: 6 g/dL — ABNORMAL LOW (ref 6.5–8.1)

## 2023-05-26 LAB — CBC WITH DIFFERENTIAL/PLATELET
Abs Immature Granulocytes: 0 10*3/uL (ref 0.00–0.07)
Basophils Absolute: 0 10*3/uL (ref 0.0–0.1)
Basophils Relative: 0 %
Eosinophils Absolute: 0.2 10*3/uL (ref 0.0–0.5)
Eosinophils Relative: 3 %
HCT: 30.2 % — ABNORMAL LOW (ref 36.0–46.0)
Hemoglobin: 9.5 g/dL — ABNORMAL LOW (ref 12.0–15.0)
Lymphocytes Relative: 10 %
Lymphs Abs: 0.8 10*3/uL (ref 0.7–4.0)
MCH: 28.2 pg (ref 26.0–34.0)
MCHC: 31.5 g/dL (ref 30.0–36.0)
MCV: 89.6 fL (ref 80.0–100.0)
Monocytes Absolute: 0.5 10*3/uL (ref 0.1–1.0)
Monocytes Relative: 6 %
Neutro Abs: 6.1 10*3/uL (ref 1.7–7.7)
Neutrophils Relative %: 81 %
Platelets: 367 10*3/uL (ref 150–400)
RBC: 3.37 MIL/uL — ABNORMAL LOW (ref 3.87–5.11)
RDW: 20.2 % — ABNORMAL HIGH (ref 11.5–15.5)
Smear Review: NORMAL
WBC: 7.5 10*3/uL (ref 4.0–10.5)
nRBC: 0.4 % — ABNORMAL HIGH (ref 0.0–0.2)

## 2023-05-26 LAB — PHOSPHORUS: Phosphorus: 3.4 mg/dL (ref 2.5–4.6)

## 2023-05-26 LAB — MAGNESIUM: Magnesium: 2.1 mg/dL (ref 1.7–2.4)

## 2023-05-26 MED ORDER — ALUM & MAG HYDROXIDE-SIMETH 200-200-20 MG/5ML PO SUSP: 15.0000 mL | Freq: Four times a day (QID) | ORAL | Status: DC | PRN

## 2023-05-26 NOTE — Plan of Care (Signed)

## 2023-05-26 NOTE — Progress Notes (Signed)
 Physical Therapy Treatment Patient Details Name: Elizabeth [name] MRN: [id number] DOB: 11-15-1950 Today's Date: 05/26/2023   History of Present Illness Elizabeth [name] is a 73 y.o. female with medical history significant of hypothyroidism, GI bleeding, bipolar, schizophrenia, panic disorder, depression with anxiety, anemia, fibromyalgia, breast cancer (s/p for right mastectomy), right lymphedema presenting with weakness, symptomatic anemia, upper GI bleed, heart failure.  Patient reports worsening shortness of breath over multiple weeks.  Was recent diagnosed with COVID roughly 2 weeks ago.  Upper respiratory symptoms have resolved though weakness has persisted.  Positive orthopnea PND.  Minimal chest pain.  No abdominal pain.  No nausea.?  Intermittent loose stools.  Positive falls at home.    PT Comments  Patient alert, initially resistant to the idea of mobility, agreeable with encouragement. She was able to transition to sitting EOB from [facility] elevated, supervision, fair sitting balance. Pt did ask this therapist "are my teeth cracking" and "are my eyes flat" during session. Pt also very anxious with all mobility attempts, increasingly so over time/distance, and SOB noted. HR in 130s during ambulation, spO2 >90% on room air throughout. PT attempted to educate and encourage rest breaks. She ambulated ~40ft with RW and CGA, and up in recliner at end of session with needs in reach. The patient would benefit from further skilled PT intervention to continue to progress towards goals.    If plan is discharge home, recommend the following: A little help with walking and/or transfers;A little help with bathing/dressing/bathroom;Assistance with cooking/housework;Assist for transportation;Help with stairs or ramp for entrance   Can travel by private vehicle     Yes  Equipment Recommendations  None recommended by PT    Recommendations for Other Services       Precautions / Restrictions  Precautions Precautions: Fall Restrictions Weight Bearing Restrictions Per Provider Order: No     Mobility  Bed Mobility Overal bed mobility: Modified Independent                  Transfers Overall transfer level: Needs assistance Equipment used: Rolling walker (2 wheels) Transfers: Sit to/from Stand Sit to Stand: Contact guard assist           General transfer comment: needed momentum and multiple attempts to come up into standing, very narrow BOS, reliant on BUe support    Ambulation/Gait Ambulation/Gait assistance: Contact guard assist Gait Distance (Feet): 50 Feet Assistive device: Rolling walker (2 wheels)         General Gait Details: 1-2 standing rest breaks attempted; pt resistant and grows increasingly anxious during mobility, SOB. spO2 >90%, Hr in 130s   Stairs             Wheelchair Mobility     Tilt Bed    Modified Rankin (Stroke Patients Only)       Balance Overall balance assessment: Needs assistance Sitting-balance support: Bilateral upper extremity supported, Feet supported Sitting balance-Leahy Scale: Good     Standing balance support: Bilateral upper extremity supported, During functional activity, Reliant on assistive device for balance Standing balance-Leahy Scale: Fair                              Communication    Cognition Arousal: Alert Behavior During Therapy: Anxious                             Following commands: Impaired Following commands  impaired: Follows multi-step commands inconsistently    Cueing Cueing Techniques: Verbal cues, Gestural cues, Tactile cues  Exercises      General Comments        Pertinent Vitals/Pain Pain Assessment Pain Assessment: Faces Faces Pain Scale: Hurts even more Pain Location: abdomen Pain Descriptors / Indicators: Aching, Sore Pain Intervention(s): Limited activity within patient's tolerance, Monitored during session, Repositioned    Home  Living                          Prior Function            PT Goals (current goals can now be found in the care plan section) Progress towards PT goals: Progressing toward goals    Frequency    Min 1X/week      PT Plan      Co-evaluation              AM-PAC PT "6 Clicks" Mobility   Outcome Measure  Help needed turning from your back to your side while in a flat bed without using bedrails?: None Help needed moving from lying on your back to sitting on the side of a flat bed without using bedrails?: None Help needed moving to and from a bed to a chair (including a wheelchair)?: A Little Help needed standing up from a chair using your arms (e.g., wheelchair or bedside chair)?: A Little Help needed to walk in hospital room?: A Little Help needed climbing 3-5 steps with a railing? : A Little 6 Click Score: 20    End of Session Equipment Utilized During Treatment: Gait belt Activity Tolerance: Patient tolerated treatment well Patient left: in chair;with call bell/phone within reach;with chair alarm set Nurse Communication: Mobility status PT Visit Diagnosis: Other abnormalities of gait and mobility (R26.89);Muscle weakness (generalized) (M62.81);History of falling (Z91.81)     Time: 1610-9604 PT Time Calculation (min) (ACUTE ONLY): 10 min  Charges:    $Therapeutic Activity: 8-22 mins PT General Charges $$ ACUTE PT VISIT: 1 Visit                     Darien Eden PT, DPT 10:14 AM,05/26/23

## 2023-05-26 NOTE — Care Management Important Message (Signed)
 Important Message  Patient Details  Name: Elizabeth [name] MRN: [id number] Date of Birth: August 15, 1950   Important Message Given:  Yes - Medicare IM     Anise Kerns 05/26/2023, 2:53 PM

## 2023-05-26 NOTE — Progress Notes (Signed)
 Occupational Therapy Treatment Patient Details Name: Elizabeth [name] MRN: [id number] DOB: Jan 09, 1951 Today's Date: 05/26/2023   History of present illness Elizabeth [name] is a 73 y.o. female with medical history significant of hypothyroidism, GI bleeding, bipolar, schizophrenia, panic disorder, depression with anxiety, anemia, fibromyalgia, breast cancer (s/p for right mastectomy), right lymphedema presenting with weakness, symptomatic anemia, upper GI bleed, heart failure.  Patient reports worsening shortness of breath over multiple weeks.  Was recent diagnosed with COVID roughly 2 weeks ago.  Upper respiratory symptoms have resolved though weakness has persisted.  Positive orthopnea PND.  Minimal chest pain.  No abdominal pain.  No nausea.?  Intermittent loose stools.  Positive falls at home.   OT comments  Pt seen for OT tx focused on ADL participation to improve safety and activity tolerance. Pt endorsing abdominal pain and reporting a "lump" in her throat that makes it difficult to swallow. RN notified per pt's request. Pt declined OOB for grooming at sink but agreeable to EOB seated grooming tasks which she completed without direct assist required. HR up to 110 with seated grooming. Will continue to progress as able.       If plan is discharge home, recommend the following:  A little help with walking and/or transfers;A little help with bathing/dressing/bathroom;Assistance with cooking/housework;Assist for transportation;Help with stairs or ramp for entrance;Direct supervision/assist for medications management   Equipment Recommendations  Other (comment) (defer)    Recommendations for Other Services      Precautions / Restrictions Precautions Precautions: Fall Restrictions Weight Bearing Restrictions Per Provider Order: No       Mobility Bed Mobility Overal bed mobility: Modified Independent                  Transfers                   General transfer  comment: pt declined     Balance Overall balance assessment: Needs assistance Sitting-balance support: Single extremity supported, No upper extremity supported, Feet supported Sitting balance-Leahy Scale: Good                                     ADL either performed or assessed with clinical judgement   ADL Overall ADL's : Needs assistance/impaired     Grooming: Sitting;Set up;Supervision/safety;Oral care Grooming Details (indicate cue type and reason): Pt declined to stand at the sink, completed seated EOB                                    Extremity/Trunk Assessment              Vision       Perception     Praxis     Communication Communication Communication: No apparent difficulties   Cognition Arousal: Alert Behavior During Therapy: Anxious Cognition: No family/caregiver present to determine baseline                               Following commands: Impaired Following commands impaired: Follows multi-step commands inconsistently      Cueing   Cueing Techniques: Verbal cues  Exercises      Shoulder Instructions       General Comments      Pertinent Vitals/ Pain       Pain  Assessment Pain Assessment: 0-10 Pain Score: 7  Pain Location: abdomen Pain Descriptors / Indicators: Aching, Sore Pain Intervention(s): Limited activity within patient's tolerance, Repositioned, Patient requesting pain meds-RN notified  Home Living                                          Prior Functioning/Environment              Frequency  Min 1X/week        Progress Toward Goals  OT Goals(current goals can now be found in the care plan section)  Progress towards OT goals: Progressing toward goals  Acute Rehab OT Goals Patient Stated Goal: improve function OT Goal Formulation: With patient Time For Goal Achievement: 06/01/23 Potential to Achieve Goals: Good  Plan      Co-evaluation                  AM-PAC OT "6 Clicks" Daily Activity     Outcome Measure   Help from another person eating meals?: None Help from another person taking care of personal grooming?: A Little Help from another person toileting, which includes using toliet, bedpan, or urinal?: A Little Help from another person bathing (including washing, rinsing, drying)?: A Lot Help from another person to put on and taking off regular upper body clothing?: A Little Help from another person to put on and taking off regular lower body clothing?: A Lot 6 Click Score: 17    End of Session    OT Visit Diagnosis: Unsteadiness on feet (R26.81);Other abnormalities of gait and mobility (R26.89);Muscle weakness (generalized) (M62.81);Repeated falls (R29.6)   Activity Tolerance Patient tolerated treatment well;Patient limited by pain   Patient Left in bed;with call bell/phone within reach;with bed alarm set   Nurse Communication Patient requests pain meds        Time: 1324-4010 OT Time Calculation (min): 10 min  Charges: OT General Charges $OT Visit: 1 Visit OT Treatments $Self Care/Home Management : 8-22 mins  Berenda Breaker., MPH, MS, OTR/L ascom [phone] 05/26/23, 12:31 PM

## 2023-05-26 NOTE — TOC Progression Note (Addendum)
 Transition of Care [facility]) - Progression Note    Patient Details  Name: Elizabeth [name] MRN: [id number] Date of Birth: 1950-07-30  [facility] CM/SW Contact  Elsie Halo, RN Phone Number: 05/26/2023, 2:25 PM  Clinical Narrative:    PT and OT recommendations updated. Patient has bed at [facility], previous ins [employer] has expired. Felix Host requesting ins auth.  TOC will continue to follow.   Expected Discharge Plan: Home w Home Health Services    Expected Discharge Plan and Services   Discharge Planning Services: CM Consult   Living arrangements for the past 2 months: Single Family Home                                       Social Determinants of Health (SDOH) Interventions SDOH Screenings   Food Insecurity: No Food Insecurity (05/17/2023)  Housing: Low Risk  (05/17/2023)  Transportation Needs: No Transportation Needs (05/17/2023)  Utilities: Not At Risk (05/17/2023)  Depression (PHQ2-9): High Risk (07/22/2022)  Financial Resource Strain: Low Risk  (08/20/2022)   Received from [facility]  Physical Activity: Insufficiently Active (08/06/2021)   Received from [facility], [facility]  Social Connections: Socially Isolated (05/17/2023)  Stress: No Stress Concern Present (08/27/2021)   Received from [facility], [facility]  Tobacco Use: Medium Risk (05/22/2023)    Readmission Risk Interventions     No data to display

## 2023-05-26 NOTE — Progress Notes (Signed)
 PROGRESS NOTE    Elizabeth [name]  ZOX:[id number] DOB: 06/15/1950 DOA: 05/17/2023 PCP: [facility]  Chief Complaint  Patient presents with   Fall   Diarrhea    Hospital Course:  Elizabeth [name] is a 73 year old female with hypothyroidism, GI bleeds, bipolar disorder, schizophrenia, panic disorder, depression, anxiety, anemia, fibromyalgia, breast cancer status post right mastectomy, right-sided lymphedema, heart failure with preserved EF, recent diagnosis of COVID-19  and extensive surgical history including duodenectomy, reimplantation of ampulla, gastrojejunostomy, right hemicolectomy with 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 On this admission she presents with diarrhea, dyspnea, generalized weakness for 2 weeks.  She also endorses dark stools and frequent falls at home.  Patient was found to be anemic to hemoglobin of 7.1.  She received 1 unit PRBC.  She underwent colonoscopy on 4/29 which revealed nonbleeding internal hemorrhoids and patent end-to-side ileo-colonic anastomosis  subjective: No acute events overnight.  Patient has no abdominal pain this morning.  She does endorse some globus sensation.  Objective: Vitals:   05/26/23 0511 05/26/23 0724 05/26/23 1226 05/26/23 1429  BP: 117/61 108/66  110/67  Pulse: 98 98 (!) 110 (!) 104  Resp:  16  15  Temp: 97.7 F (36.5 C) 98 F (36.7 C)    TempSrc: Oral     SpO2: 94% 97% 98% 94%  Weight:      Height:       No intake or output data in the 24 hours ending 05/26/23 1510   Filed Weights   05/22/23 0536 05/25/23 0541 05/26/23 0500  Weight: 46.6 kg 46 kg 46.1 kg    Examination: General exam: Appears calm and comfortable, NAD  Respiratory system: No work of breathing, symmetric chest wall expansion Cardiovascular system: S1 & S2 heard, RRR.  Gastrointestinal system: Abdomen is nondistended, soft, diffusely tender  to palpation Neuro: Alert and oriented. No focal neurological deficits. Extremities: Symmetric, expected ROM Skin: No rashes, lesions Psychiatry: Demonstrates appropriate judgement and insight. Mood & affect appropriate for situation.   Assessment & Plan:  Principal Problem:   Symptomatic anemia Active Problems:   Major depressive disorder, recurrent severe without psychotic features (HCC)   GI bleed   Weakness   Heart failure (HCC)   Hypothyroidism   History of essential hypertension   AKI (acute kidney injury) (HCC)   Acute on chronic heart failure with preserved ejection fraction (HFpEF) (HCC)   Protein-calorie malnutrition, severe   Angiodysplasia of small intestine, except duodenum with bleeding   Jejunal polyp    Acute blood loss anemia History of chronic iron deficiency anemia - Status post 1 unit PRBC on 4/26 for hemoglobin 7.1 - Iron studies: Iron 47, TIBC 122, sat ratio 39, ferritin 830.  No evidence of iron deficiency at this time - B12: 625 - Small bowel enteroscopy 5/3: Pedunculated polypoid mass in jejunum, biopsied.  Will follow biopsies.  No AVMs appreciated.  No bleeding throughout enteroscopy. - Colonoscopy 4/29: Within normal limits - EGD 4/30: Normal esophagus, multiple gastric polyps which were biopsied, no obvious source of bleeding.  -- Capsule endoscopy:  2 areas in proximal jejunum with stigmata of recent bleeding likely representing angiodysplasias as well as a 1 cm sessile polyp with clean-based ulceration just distal to the proximal jejunum. - Patient now tolerating diet well, no abdominal pain, no nausea or vomiting. - Does endorse some globus sensation - Has had recent EGD proceed with as needed Maalox -  Continue to monitor hemoglobin.  Currently stable. - Follow-up outpatient for biopsy results.  AKI - Likely prerenal given diarrhea - Baseline 0.9, up to 1.32.   Continue to follow CMP - IV fluids as needed - Renally dosed with a creatinine  clearance of 39 when needed - Encourage oral intake - Avoid nephrotoxic medications  Hypokalemia - Continue to follow CMP, replace as needed  Vitamin A  deficiency - Secondary to patient's extensive bowel surgeries, poor absorption. - Have discussed with RD and pharmacy.  Continue with current supplementation  Elevated vitamin D  - Patient was taking high-dose vitamin D  prior to admission - Discontinue at discharge  Generalized weakness - PT recommending discharge to SNF  Bipolar disorder Schizophrenia Anxiety Depression - Resume home meds - Frequent reorientation  History of breast cancer status post mastectomy - Mastectomy February 2023 - On letrozole  at home - Chronic lymphedema.  Acute on chronic diastolic heart failure - Echo with normal EF, grade 1 diastolic dysfunction, normal pulmonary pressures. - Cardiology evaluated patient during this admission for clearance for colonoscopy. - Resume home meds, GDMT as BP tolerates.  Hypothyroidism - TSH 5.26 - Takes 88 mcg levothyroxine  at home, have increased to 100 mcg - Needs outpatient follow-up for recheck TSH in 4 to 6 weeks  Hypoalbuminemia - Suspect dietary deficiencies.  RD consult  Extensive abdominal surgical history - Duodenectomy, reimplantation of ampulla, gastrojejunostomy, right hemicolectomy with ileocolonic anastomosis due to large duodenal diverticulum that was complicated by anastomotic leak managed with drains in 2018 - With subsequent vitamin deficiencies suggesting malabsorption. - High risk for SBO given presumed adhesions - Continue to monitor closely.  Is having regular bowel movements - High risk for electrolyte abnormalities, vitamin A  and vitamin D  as above.  Will continue to trend daily and replace as needed  DVT prophylaxis: SCDs   Code Status: Full Code Disposition: Medically cleared for discharge to skilled nursing facility.  Appears patient has lost her authorization.  TOC is aware  working on it  Consultants:  Treatment Team:  Consulting Physician: Shane Darling, MD Consulting Physician: [name], MD Consulting Physician: Cassie Click, MD  Procedures:  Colonoscopy  Antimicrobials:  Anti-infectives (From admission, onward)    None       Data Reviewed: I have personally reviewed following labs and imaging studies CBC: Recent Labs  Lab 05/22/23 0613 05/23/23 0420 05/24/23 0355 05/25/23 0336 05/26/23 0500  WBC 6.7 7.6 8.1 7.6 7.5  NEUTROABS 5.0 5.4 5.5 5.1 6.1  HGB 9.3* 9.5* 9.4* 9.2* 9.5*  HCT 29.1* 29.7* 29.4* 29.0* 30.2*  MCV 89.8 89.2 89.1 89.0 89.6  PLT 332 375 370 350 367   Basic Metabolic Panel: Recent Labs  Lab 05/22/23 0613 05/23/23 0420 05/24/23 0355 05/25/23 0336 05/26/23 0500  NA 138 135 135 139 140  K 4.2 3.9 4.0 4.2 4.5  CL 106 103 103 105 106  CO2 23 22 22 24 25   GLUCOSE 100* 91 124* 108* 93  BUN 23 24* 24* 25* 26*  CREATININE 1.01* 0.95 0.92 0.88 0.93  CALCIUM  7.9* 8.1* 8.2* 8.5* 8.4*  MG 2.0 2.1 2.0 2.1 2.1  PHOS 2.9 3.5 3.1 3.4 3.4   GFR: Estimated Creatinine Clearance: 39.3 mL/min (by C-G formula based on SCr of 0.93 mg/dL). Liver Function Tests: Recent Labs  Lab 05/22/23 4782 05/23/23 0420 05/24/23 0355 05/25/23 0336 05/26/23 0500  AST 99* 97* 88* 89* 97*  ALT 23 25 24 25 25   ALKPHOS 348* 345* 341* 336* 321*  BILITOT 0.6 0.8 0.5 0.5 0.7  PROT 6.2* 6.3* 6.3* 6.1* 6.0*  ALBUMIN  1.9* 2.2* 2.0* 1.9* 1.9*   CBG: No results for input(s): "GLUCAP" in the last 168 hours.  Recent Results (from the past 240 hours)  C Difficile Quick Screen w PCR reflex     Status: None   Collection Time: 05/18/23  8:12 AM   Specimen: STOOL  Result Value Ref Range Status   C Diff antigen NEGATIVE NEGATIVE Final   C Diff toxin NEGATIVE NEGATIVE Final   C Diff interpretation No C. difficile detected.  Final    Comment: Performed at [facility], [address] [place], [place] [address]   Gastrointestinal Panel by PCR , Stool     Status: None   Collection Time: 05/18/23 11:00 AM   Specimen: Stool  Result Value Ref Range Status   Campylobacter species NOT DETECTED NOT DETECTED Final   Plesimonas shigelloides NOT DETECTED NOT DETECTED Final   Salmonella species NOT DETECTED NOT DETECTED Final   Yersinia enterocolitica NOT DETECTED NOT DETECTED Final   Vibrio species NOT DETECTED NOT DETECTED Final   Vibrio cholerae NOT DETECTED NOT DETECTED Final   Enteroaggregative E coli (EAEC) NOT DETECTED NOT DETECTED Final   Enteropathogenic E coli (EPEC) NOT DETECTED NOT DETECTED Final   Enterotoxigenic E coli (ETEC) NOT DETECTED NOT DETECTED Final   Shiga like toxin producing E coli (STEC) NOT DETECTED NOT DETECTED Final   Shigella/Enteroinvasive E coli (EIEC) NOT DETECTED NOT DETECTED Final   Cryptosporidium NOT DETECTED NOT DETECTED Final   Cyclospora cayetanensis NOT DETECTED NOT DETECTED Final   Entamoeba histolytica NOT DETECTED NOT DETECTED Final   Giardia lamblia NOT DETECTED NOT DETECTED Final   Adenovirus F40/41 NOT DETECTED NOT DETECTED Final   Astrovirus NOT DETECTED NOT DETECTED Final   Norovirus GI/GII NOT DETECTED NOT DETECTED Final   Rotavirus A NOT DETECTED NOT DETECTED Final   Sapovirus (I, II, IV, and V) NOT DETECTED NOT DETECTED Final    Comment: Performed at [facility], [address], [place], [place] [address]     Radiology Studies: No results found.  Scheduled Meds:  levothyroxine   100 mcg Oral QAC breakfast   pantoprazole   40 mg Oral BID   QUEtiapine   25 mg Oral QHS   sertraline   100 mg Oral QHS   sodium chloride  flush  3 mL Intravenous Q12H   vitamin A   10,000 Units Oral Daily   Continuous Infusions:   LOS: 3 days  MDM: Patient is high risk for one or more organ failure.  They necessitate ongoing hospitalization for continued IV therapies and subsequent lab monitoring. Total time spent interpreting labs and vitals, reviewing the  medical record, coordinating care amongst consultants and care team members, directly assessing and discussing care with the patient and/or family: 55 min  Sharrie Self, DO [facility]  To contact the attending physician between 7A-7P please use Epic Chat. To contact the covering physician during after hours 7P-7A, please review Amion.  05/26/2023, 3:10 PM   *This document has been created with the assistance of dictation software. Please excuse typographical errors. *

## 2023-05-27 DIAGNOSIS — F332 Major depressive disorder, recurrent severe without psychotic features: Secondary | ICD-10-CM | POA: Diagnosis not present

## 2023-05-27 DIAGNOSIS — K922 Gastrointestinal hemorrhage, unspecified: Secondary | ICD-10-CM | POA: Diagnosis not present

## 2023-05-27 DIAGNOSIS — D649 Anemia, unspecified: Secondary | ICD-10-CM | POA: Diagnosis not present

## 2023-05-27 DIAGNOSIS — R531 Weakness: Secondary | ICD-10-CM | POA: Diagnosis not present

## 2023-05-27 NOTE — TOC Progression Note (Signed)
 [facility]) - Progression Note    Patient Details  Name: Elizabeth [name] MRN: [id number] Date of Birth: [birth year]  [facility] CM/SW Contact  Elsie Halo, RN Phone Number: 05/27/2023, 3:49 PM  Clinical Narrative:     Insurance auth requested for [employer] on 5/5. [employer] requested a P2P on today. P2P is pending. TOC will continue to follow.  Expected Discharge Plan: Home w Home Health Services    Expected Discharge Plan and Services   Discharge Planning Services: CM Consult   Living arrangements for the past 2 months: Single Family Home                                       Social Determinants of Health (SDOH) Interventions SDOH Screenings   Food Insecurity: No Food Insecurity (05/17/2023)  Housing: Low Risk  (05/17/2023)  Transportation Needs: No Transportation Needs (05/17/2023)  Utilities: Not At Risk (05/17/2023)  Depression (PHQ2-9): High Risk (07/22/2022)  Financial Resource Strain: Low Risk  (08/20/2022)   Received from [facility]  Physical Activity: Insufficiently Active (08/06/2021)   Received from [facility], [facility]  Social Connections: Socially Isolated (05/17/2023)  Stress: No Stress Concern Present (08/27/2021)   Received from [facility], [facility]  Tobacco Use: Medium Risk (05/22/2023)    Readmission Risk Interventions     No data to display

## 2023-05-27 NOTE — Progress Notes (Signed)
 PROGRESS NOTE    Elizabeth [name]  VHQ:[id number] DOB: 08-11-1950 DOA: 05/17/2023 PCP: [facility]  Chief Complaint  Patient presents with   Fall   Diarrhea    Hospital Course:  Elizabeth [name] is a 73 year old female with hypothyroidism, GI bleeds, bipolar disorder, schizophrenia, panic disorder, depression, anxiety, anemia, fibromyalgia, breast cancer status post right mastectomy, right-sided lymphedema, heart failure with preserved EF, recent diagnosis of COVID-19  and extensive surgical history including duodenectomy, reimplantation of ampulla, gastrojejunostomy, right hemicolectomy with 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 On this admission she presents with diarrhea, dyspnea, generalized weakness for 2 weeks.  She also endorses dark stools and frequent falls at home.  Patient was found to be anemic to hemoglobin of 7.1.  She received 1 unit PRBC.  She underwent colonoscopy on 4/29 which revealed nonbleeding internal hemorrhoids and patent end-to-side ileo-colonic anastomosis  subjective: No acute events overnight.  Patient is endorsing globus sensation which she reports began after enteroscopy.  We discussed no abnormal findings on scans, we will proceed with as needed Maalox.  Son, [name], requesting phone call today.  We extensively discussed care plan.  All questions and concerns answered  Objective: Vitals:   05/26/23 1429 05/26/23 2020 05/27/23 0428 05/27/23 0830  BP: 110/67 108/70 121/70 107/64  Pulse: (!) 104 (!) 105 (!) 103 100  Resp: 15 19 17 18   Temp:  98.6 F (37 C) 97.7 F (36.5 C) 98.6 F (37 C)  TempSrc:  Oral Oral Oral  SpO2: 94% 97% 96% 95%  Weight:   47 kg   Height:        Intake/Output Summary (Last 24 hours) at 05/27/2023 1400 Last data filed at 05/27/2023 1100 Gross per 24 hour  Intake 0 ml  Output 4 ml  Net -4 ml     Filed Weights    05/25/23 0541 05/26/23 0500 05/27/23 0428  Weight: 46 kg 46.1 kg 47 kg    Examination: General exam: Appears calm and comfortable, NAD  Respiratory system: No work of breathing, symmetric chest wall expansion Cardiovascular system: S1 & S2 heard, RRR.  Gastrointestinal system: Abdomen is nondistended, soft, diffusely tender to palpation Neuro: Alert and oriented. No focal neurological deficits. Extremities: Symmetric, expected ROM Skin: No rashes, lesions Psychiatry: Demonstrates appropriate judgement and insight. Mood & affect appropriate for situation.   Assessment & Plan:  Principal Problem:   Symptomatic anemia Active Problems:   Major depressive disorder, recurrent severe without psychotic features (HCC)   GI bleed   Weakness   Heart failure (HCC)   Hypothyroidism   History of essential hypertension   AKI (acute kidney injury) (HCC)   Acute on chronic heart failure with preserved ejection fraction (HFpEF) (HCC)   Protein-calorie malnutrition, severe   Angiodysplasia of small intestine, except duodenum with bleeding   Jejunal polyp    Acute blood loss anemia History of chronic iron deficiency anemia - Status post 1 unit PRBC on 4/26 for hemoglobin 7.1 - Iron studies: Iron 47, TIBC 122, sat ratio 39, ferritin 830.  No evidence of iron deficiency at this time - B12: 625 - Small bowel enteroscopy 5/3: Pedunculated polypoid mass in jejunum, biopsied.  Will follow biopsies.  No AVMs appreciated.  No bleeding throughout enteroscopy. - Colonoscopy 4/29: Within normal limits - EGD 4/30: Normal esophagus, multiple gastric polyps which were biopsied, no obvious source of bleeding.   -- Capsule endoscopy:  2  areas in proximal jejunum with stigmata of recent bleeding likely representing angiodysplasias as well as a 1 cm sessile polyp with clean-based ulceration just distal to the proximal jejunum. - Patient now tolerating diet well, no abdominal pain, no nausea or vomiting. -  Hemoglobin remained stable, continue to follow - Some globus sensation, likely irritation from recent scope.  As needed Maalox - Follow biopsy results, outpatient follow-up with GI  AKI - Likely prerenal given diarrhea - Baseline 0.9, up to 1.32.  Resolved to baseline now - Continue to trend CMP - As needed IV fluids - Renally dosed creatinine clearance of 39 when needed - Oral intake is improving.  Avoid nephrotoxic meds  Hypokalemia - Trend CMP.  Replace as needed  Vitamin A  deficiency - Secondary to patient's extensive bowel surgeries, poor absorption. - Have discussed with RD and pharmacy.  Have initiated supplementation, continue  Elevated vitamin D  - Patient was taking high-dose vitamin D  prior to admission - Discontinue at discharge  Generalized weakness - Pending SNF placement.  Has bed at [facility].  Pending Auth  Bipolar disorder Schizophrenia Anxiety Depression - Continue current medications - Frequent reorientation, delirium precautions  History of breast cancer status post mastectomy - Mastectomy February 2023 - On letrozole  at home - Chronic lymphedema.  Acute on chronic diastolic heart failure - Echo with normal EF, grade 1 diastolic dysfunction, normal pulmonary pressures. - Cardiology evaluated patient during this admission for clearance for colonoscopy. - Resume home meds, GDMT as BP tolerates.  Hypothyroidism - TSH 5.26 - Takes 88 mcg levothyroxine  at home, have increased to 100 mcg - Needs outpatient follow-up for recheck TSH in 4 to 6 weeks  Hypoalbuminemia - Suspect dietary deficiencies.  RD consult  Extensive abdominal surgical history - Duodenectomy, reimplantation of ampulla, gastrojejunostomy, right hemicolectomy with ileocolonic anastomosis due to large duodenal diverticulum that was complicated by anastomotic leak managed with drains in 2018 - With subsequent vitamin deficiencies suggesting malabsorption. - High risk for SBO given  presumed adhesions - Continue to monitor closely.  Is having regular bowel movements - High risk for electrolyte abnormalities, vitamin A  and vitamin D  as above. - Continue daily trending electrolytes.  Replace as needed  DVT prophylaxis: SCDs   Code Status: Full Code Disposition: Medically cleared for discharge to skilled nursing facility.  Has bed at [facility], pending insurance authorization Have discussed with patient's son, [name], on the phone today.  Updated extensively.  Consultants:  Treatment Team:  Consulting Physician: Shane Darling, MD Consulting Physician: [name], MD Consulting Physician: Cassie Click, MD  Procedures:  Colonoscopy  Antimicrobials:  Anti-infectives (From admission, onward)    None       Data Reviewed: I have personally reviewed following labs and imaging studies CBC: Recent Labs  Lab 05/22/23 0613 05/23/23 0420 05/24/23 0355 05/25/23 0336 05/26/23 0500  WBC 6.7 7.6 8.1 7.6 7.5  NEUTROABS 5.0 5.4 5.5 5.1 6.1  HGB 9.3* 9.5* 9.4* 9.2* 9.5*  HCT 29.1* 29.7* 29.4* 29.0* 30.2*  MCV 89.8 89.2 89.1 89.0 89.6  PLT 332 375 370 350 367   Basic Metabolic Panel: Recent Labs  Lab 05/22/23 0613 05/23/23 0420 05/24/23 0355 05/25/23 0336 05/26/23 0500  NA 138 135 135 139 140  K 4.2 3.9 4.0 4.2 4.5  CL 106 103 103 105 106  CO2 23 22 22 24 25   GLUCOSE 100* 91 124* 108* 93  BUN 23 24* 24* 25* 26*  CREATININE 1.01* 0.95 0.92 0.88 0.93  CALCIUM  7.9*  8.1* 8.2* 8.5* 8.4*  MG 2.0 2.1 2.0 2.1 2.1  PHOS 2.9 3.5 3.1 3.4 3.4   GFR: Estimated Creatinine Clearance: 39.3 mL/min (by C-G formula based on SCr of 0.93 mg/dL). Liver Function Tests: Recent Labs  Lab 05/22/23 0613 05/23/23 0420 05/24/23 0355 05/25/23 0336 05/26/23 0500  AST 99* 97* 88* 89* 97*  ALT 23 25 24 25 25   ALKPHOS 348* 345* 341* 336* 321*  BILITOT 0.6 0.8 0.5 0.5 0.7  PROT 6.2* 6.3* 6.3* 6.1* 6.0*  ALBUMIN  1.9* 2.2* 2.0* 1.9* 1.9*   CBG: No results for  input(s): "GLUCAP" in the last 168 hours.  Recent Results (from the past 240 hours)  C Difficile Quick Screen w PCR reflex     Status: None   Collection Time: 05/18/23  8:12 AM   Specimen: STOOL  Result Value Ref Range Status   C Diff antigen NEGATIVE NEGATIVE Final   C Diff toxin NEGATIVE NEGATIVE Final   C Diff interpretation No C. difficile detected.  Final    Comment: Performed at [facility], [address] [place], [place] [address]  Gastrointestinal Panel by PCR , Stool     Status: None   Collection Time: 05/18/23 11:00 AM   Specimen: Stool  Result Value Ref Range Status   Campylobacter species NOT DETECTED NOT DETECTED Final   Plesimonas shigelloides NOT DETECTED NOT DETECTED Final   Salmonella species NOT DETECTED NOT DETECTED Final   Yersinia enterocolitica NOT DETECTED NOT DETECTED Final   Vibrio species NOT DETECTED NOT DETECTED Final   Vibrio cholerae NOT DETECTED NOT DETECTED Final   Enteroaggregative E coli (EAEC) NOT DETECTED NOT DETECTED Final   Enteropathogenic E coli (EPEC) NOT DETECTED NOT DETECTED Final   Enterotoxigenic E coli (ETEC) NOT DETECTED NOT DETECTED Final   Shiga like toxin producing E coli (STEC) NOT DETECTED NOT DETECTED Final   Shigella/Enteroinvasive E coli (EIEC) NOT DETECTED NOT DETECTED Final   Cryptosporidium NOT DETECTED NOT DETECTED Final   Cyclospora cayetanensis NOT DETECTED NOT DETECTED Final   Entamoeba histolytica NOT DETECTED NOT DETECTED Final   Giardia lamblia NOT DETECTED NOT DETECTED Final   Adenovirus F40/41 NOT DETECTED NOT DETECTED Final   Astrovirus NOT DETECTED NOT DETECTED Final   Norovirus GI/GII NOT DETECTED NOT DETECTED Final   Rotavirus A NOT DETECTED NOT DETECTED Final   Sapovirus (I, II, IV, and V) NOT DETECTED NOT DETECTED Final    Comment: Performed at [facility], [address], [place], [place] [address]     Radiology Studies: No results found.  Scheduled Meds:  levothyroxine   100 mcg  Oral QAC breakfast   pantoprazole   40 mg Oral BID   QUEtiapine   25 mg Oral QHS   sertraline   100 mg Oral QHS   sodium chloride  flush  3 mL Intravenous Q12H   vitamin A   10,000 Units Oral Daily   Continuous Infusions:   LOS: 4 days  MDM: Patient is high risk for one or more organ failure.  They necessitate ongoing hospitalization for continued IV therapies and subsequent lab monitoring. Total time spent interpreting labs and vitals, reviewing the medical record, coordinating care amongst consultants and care team members, directly assessing and discussing care with the patient and/or family: 55 min  Delphia Kaylor, DO [facility]  To contact the attending physician between 7A-7P please use Epic Chat. To contact the covering physician during after hours 7P-7A, please review Amion.  05/27/2023, 2:00 PM   *This document has  been created with the assistance of dictation software. Please excuse typographical errors. *

## 2023-05-28 ENCOUNTER — Encounter

## 2023-05-28 ENCOUNTER — Ambulatory Visit: Admitting: Oncology

## 2023-05-28 DIAGNOSIS — D649 Anemia, unspecified: Secondary | ICD-10-CM | POA: Diagnosis not present

## 2023-05-28 LAB — SURGICAL PATHOLOGY

## 2023-05-28 NOTE — Progress Notes (Signed)
 Progress Note    Elizabeth [name]  ZOX:[id number] DOB: 1950-08-31  DOA: 05/17/2023 PCP: [facility]      Brief Narrative:    Medical records reviewed and are as summarized below:  Elizabeth [name] is a 73 y.o. 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    She presented to the hospital with diarrhea, shortness of breath and general weakness for about 2 weeks duration.  Reportedly, she had some dark stools.  She has also been falling at home.     Assessment/Plan:   Principal Problem:   Symptomatic anemia Active Problems:   Major depressive disorder, recurrent severe without psychotic features (HCC)   GI bleed   Weakness   Heart failure (HCC)   Hypothyroidism   History of essential hypertension   AKI (acute kidney injury) (HCC)   Acute on chronic heart failure with preserved ejection fraction (HFpEF) (HCC)   Protein-calorie malnutrition, severe   Angiodysplasia of small intestine, except duodenum with bleeding   Jejunal polyp    Body mass index is 20.28 kg/m.   Acute GI bleeding, melena: Continue Protonix .  Small bowel enteroscopy 5/3: Pedunculated polypoid mass in jejunum, biopsied.  Will follow biopsies.  No AVMs appreciated.  No bleeding throughout enteroscopy. - Colonoscopy 4/29: Within normal limits -  EGD 4/30: Normal esophagus, multiple gastric polyps which were biopsied, no obvious source of bleeding.   -- Capsule endoscopy:  2 areas in proximal jejunum with stigmata of recent bleeding likely representing angiodysplasias as well as a 1 cm sessile polyp with clean-based ulceration just distal to the proximal jejunum.  Diarrhea has improved. Recent COVID-19 infection on 04/29/2023.   Acute blood loss anemia, history of chronic iron deficiency anemia: S/p transfusion 1 unit of PRBCs on 05/17/2023 for hemoglobin of 7.1.  H&H stable. Hemoglobin was 8 on admission. Iron studies: Iron 47, TIBC 122, saturation ratio 39 and ferritin 830.  Vitamin B12- 625   Acute diastolic heart failure: No acute issues.  He was seen by cardiologist. S/p treatment with IV Lasix .  BNP 311.6 2D echo showed EF estimated at 55 to 60%, grade 1 diastolic dysfunction, mild MR   AKI: Improved   Hypokalemia: Improved.    Vitamin D  deficiency: Vitamin D  level was 4.5.  Continue vitamin D  supplement. Elevated vitamin D  level (101.94): patient was taking high-dose vitamin D  prior to admission.  This will be discontinued at discharge.   General Weakness: PT recommended discharge to SNF.  Unfortunately, discharge to SNF was not authorized by her medical insurance.  She is considering an appeal.  She may also decide to go home with home health therapy.   Comorbidities include bipolar disorder, schizophrenia, anxiety, depression, depression, history of breast cancer s/p mastectomy in February 2023 (on letrozole ).   Plan discussed with her significant other at the bedside.   Diet Order             DIET SOFT Room service appropriate? Yes; Fluid  consistency: Thin  Diet effective now                            Consultants: Gastroenterologist Cardiologist  Procedures: Colonoscopy 05/20/2023 Upper endoscopy 05/22/2023 Video capsule endoscopy 05/22/2023 Small bowel endoscopy  05/24/2023    Medications:    levothyroxine   100 mcg Oral QAC breakfast   pantoprazole   40 mg Oral BID   QUEtiapine   25 mg Oral QHS   sertraline   100 mg Oral QHS   sodium chloride  flush  3 mL Intravenous Q12H   vitamin A   10,000 Units Oral Daily   Continuous Infusions:      Anti-infectives (From admission, onward)    None              Family Communication/Anticipated D/C date and plan/Code Status   DVT prophylaxis: Place and maintain sequential compression device Start: 05/21/23 1602 Place TED hose Start: 05/17/23 1454     Code Status: Full Code  Family Communication: Plan discussed with [name], son, at the bedside Disposition Plan: Plan to discharge home   Status is: Observation The patient will require care spanning > 2 midnights and should be moved to inpatient because: GI bleed, CHF, general weakness       Subjective:   Interval events noted.  No new complaints.  Significant other was at the bedside.  Objective:    Vitals:   05/27/23 2121 05/28/23 0556 05/28/23 0849 05/28/23 1638  BP: (!) 103/59 118/67 105/61 115/68  Pulse: 100 99 98 (!) 106  Resp: 17 18 18 18   Temp: 98.2 F (36.8 C) 97.8 F (36.6 C) 98.4 F (36.9 C) 98.3 F (36.8 C)  TempSrc: Oral Oral Oral Oral  SpO2: 93% 94% 94% 94%  Weight:  47.1 kg    Height:       No data found.   Intake/Output Summary (Last 24 hours) at 05/28/2023 1711 Last data filed at 05/28/2023 1520 Gross per 24 hour  Intake 360 ml  Output 2 ml  Net 358 ml    Filed Weights   05/26/23 0500 05/27/23 0428 05/28/23 0556  Weight: 46.1 kg 47 kg 47.1 kg    Exam:  GEN: NAD SKIN: Warm and dry EYES: No pallor or icterus ENT: MMM CV: RRR PULM: CTA B ABD: soft, ND, NT, +BS CNS: AAO x 3, non focal EXT: No edema or tenderness         Data Reviewed:   I have personally reviewed following labs and imaging studies:  Labs: Labs show the following:   Basic Metabolic Panel: Recent Labs  Lab  05/22/23 0613 05/23/23 0420 05/24/23 0355 05/25/23 0336 05/26/23 0500  NA 138 135 135 139 140  K 4.2 3.9 4.0 4.2 4.5  CL 106 103 103 105 106  CO2 23 22 22 24 25   GLUCOSE 100* 91 124* 108* 93  BUN 23 24* 24* 25* 26*  CREATININE 1.01* 0.95 0.92 0.88 0.93  CALCIUM  7.9* 8.1* 8.2* 8.5* 8.4*  MG 2.0 2.1 2.0 2.1 2.1  PHOS 2.9 3.5 3.1 3.4 3.4   GFR Estimated Creatinine Clearance: 39.3 mL/min (by C-G formula based on SCr of 0.93 mg/dL). Liver Function Tests: Recent Labs  Lab 05/22/23 6962 05/23/23 0420 05/24/23 0355 05/25/23 0336 05/26/23 0500  AST 99* 97* 88* 89* 97*  ALT 23 25 24 25 25   ALKPHOS 348* 345* 341* 336* 321*  BILITOT 0.6 0.8 0.5 0.5 0.7  PROT 6.2* 6.3* 6.3* 6.1*  6.0*  ALBUMIN  1.9* 2.2* 2.0* 1.9* 1.9*   No results for input(s): "LIPASE", "AMYLASE" in the last 168 hours.  No results for input(s): "AMMONIA" in the last 168 hours. Coagulation profile No results for input(s): "INR", "PROTIME" in the last 168 hours.  CBC: Recent Labs  Lab 05/22/23 0613 05/23/23 0420 05/24/23 0355 05/25/23 0336 05/26/23 0500  WBC 6.7 7.6 8.1 7.6 7.5  NEUTROABS 5.0 5.4 5.5 5.1 6.1  HGB 9.3* 9.5* 9.4* 9.2* 9.5*  HCT 29.1* 29.7* 29.4* 29.0* 30.2*  MCV 89.8 89.2 89.1 89.0 89.6  PLT 332 375 370 350 367   Cardiac Enzymes: No results for input(s): "CKTOTAL", "CKMB", "CKMBINDEX", "TROPONINI" in the last 168 hours. BNP (last 3 results) No results for input(s): "PROBNP" in the last 8760 hours. CBG: No results for input(s): "GLUCAP" in the last 168 hours. D-Dimer: No results for input(s): "DDIMER" in the last 72 hours. Hgb A1c: No results for input(s): "HGBA1C" in the last 72 hours. Lipid Profile: No results for input(s): "CHOL", "HDL", "LDLCALC", "TRIG", "CHOLHDL", "LDLDIRECT" in the last 72 hours. Thyroid function studies: No results for input(s): "TSH", "T4TOTAL", "T3FREE", "THYROIDAB" in the last 72 hours.  Invalid input(s): "FREET3"  Anemia work up: No results for  input(s): "VITAMINB12", "FOLATE", "FERRITIN", "TIBC", "IRON", "RETICCTPCT" in the last 72 hours.  Sepsis Labs: Recent Labs  Lab 05/23/23 0420 05/24/23 0355 05/25/23 0336 05/26/23 0500  WBC 7.6 8.1 7.6 7.5    Microbiology No results found for this or any previous visit (from the past 240 hours).   Procedures and diagnostic studies:  No results found.              LOS: 5 days   Dany Walther  [facility]   Pager on [email or web address]. If 7PM-7AM, please contact night-coverage at [email or web address]     05/28/2023, 5:11 PM

## 2023-05-28 NOTE — Progress Notes (Signed)
 Physical Therapy Treatment Patient Details Name: Elizabeth [name] MRN: [id number] DOB: 07-Apr-1950 Today's Date: 05/28/2023   History of Present Illness Elizabeth [name] is a 73 y.o. female with medical history significant of hypothyroidism, GI bleeding, bipolar, schizophrenia, panic disorder, depression with anxiety, anemia, fibromyalgia, breast cancer (s/p for right mastectomy), right lymphedema presenting with weakness, symptomatic anemia, upper GI bleed, heart failure.  Patient reports worsening shortness of breath over multiple weeks.  Was recent diagnosed with COVID roughly 2 weeks ago.  Upper respiratory symptoms have resolved though weakness has persisted.  Positive orthopnea PND.  Minimal chest pain.  No abdominal pain.  No nausea.?  Intermittent loose stools.  Positive falls at home.    PT Comments  Patient alert, agreeable to PT with minimal encouragement. modI for bed mobility, able to sit <> stand twice during session. First attempt pt pulls on RW, with some RW tipping but able to complete CGA. Second attempt good follow through on education on proper hand placement. She ambulated two bouts of 18ft with RW, CGA. Very limited by SOB, HR in 130s with mobility, spO2 >95% throughout. The patient would benefit from further skilled PT intervention to continue to progress towards goals.     If plan is discharge home, recommend the following: A little help with walking and/or transfers;A little help with bathing/dressing/bathroom;Assistance with cooking/housework;Assist for transportation;Help with stairs or ramp for entrance   Can travel by private vehicle     Yes  Equipment Recommendations  None recommended by PT    Recommendations for Other Services       Precautions / Restrictions Precautions Precautions: Fall Restrictions Weight Bearing Restrictions Per Provider Order: No     Mobility  Bed Mobility Overal bed mobility: Modified Independent                   Transfers Overall transfer level: Needs assistance Equipment used: Rolling walker (2 wheels) Transfers: Sit to/from Stand Sit to Stand: Contact guard assist                Ambulation/Gait Ambulation/Gait assistance: Contact guard assist Gait Distance (Feet): 40 Feet (52ft, seated rest break, 101ft) Assistive device: Rolling walker (2 wheels)             Stairs             Wheelchair Mobility     Tilt Bed    Modified Rankin (Stroke Patients Only)       Balance Overall balance assessment: Needs assistance Sitting-balance support: Single extremity supported, No upper extremity supported, Feet supported Sitting balance-Leahy Scale: Good     Standing balance support: Bilateral upper extremity supported, During functional activity, Reliant on assistive device for balance Standing balance-Leahy Scale: Fair                              Communication    Cognition Arousal: Alert Behavior During Therapy: Anxious   PT - Cognitive impairments: No apparent impairments                                Cueing    Exercises      General Comments        Pertinent Vitals/Pain Pain Assessment Pain Assessment: No/denies pain    Home Living  Prior Function            PT Goals (current goals can now be found in the care plan section) Progress towards PT goals: Progressing toward goals    Frequency    Min 1X/week      PT Plan      Co-evaluation              AM-PAC PT "6 Clicks" Mobility   Outcome Measure  Help needed turning from your back to your side while in a flat bed without using bedrails?: None Help needed moving from lying on your back to sitting on the side of a flat bed without using bedrails?: None Help needed moving to and from a bed to a chair (including a wheelchair)?: A Little Help needed standing up from a chair using your arms (e.g., wheelchair or bedside  chair)?: A Little Help needed to walk in hospital room?: A Little Help needed climbing 3-5 steps with a railing? : A Little 6 Click Score: 20    End of Session Equipment Utilized During Treatment: Gait belt Activity Tolerance: Patient tolerated treatment well Patient left: with call bell/phone within reach;in bed;with bed alarm set Nurse Communication: Mobility status PT Visit Diagnosis: Other abnormalities of gait and mobility (R26.89);Muscle weakness (generalized) (M62.81);History of falling (Z91.81)     Time: 1610-9604 PT Time Calculation (min) (ACUTE ONLY): 11 min  Charges:    $Therapeutic Activity: 8-22 mins PT General Charges $$ ACUTE PT VISIT: 1 Visit                     Darien Eden PT, DPT 3:32 PM,05/28/23

## 2023-05-28 NOTE — TOC Progression Note (Addendum)
 Transition of Care [facility]) - Progression Note    Patient Details  Name: Elizabeth [name] MRN: [id number] Date of Birth: 1950/09/28  [facility] CM/SW Contact  Elsie Halo, RN Phone Number: 05/28/2023, 3:46 PM  Clinical Narrative:    Received denial from [facility] TOC outreached to the patient's son, Elizabeth [name] [phone]  to notify him of the insurance denial. TOC explained that the patient has the right to appeal and provided the appeal # [phone]. He is deciding between initiating the appeal vs Home w/ HH.  4:30 PM  TOC received a call from the patient's son. The patient will go home with home health PT/OT and RN for medication management.  Expected Discharge Plan: Home w Home Health Services    Expected Discharge Plan and Services   Discharge Planning Services: CM Consult   Living arrangements for the past 2 months: Single Family Home                                       Social Determinants of Health (SDOH) Interventions SDOH Screenings   Food Insecurity: No Food Insecurity (05/17/2023)  Housing: Low Risk  (05/17/2023)  Transportation Needs: No Transportation Needs (05/17/2023)  Utilities: Not At Risk (05/17/2023)  Depression (PHQ2-9): High Risk (07/22/2022)  Financial Resource Strain: Low Risk  (08/20/2022)   Received from [facility]  Physical Activity: Insufficiently Active (08/06/2021)   Received from [facility], [facility]  Social Connections: Socially Isolated (05/17/2023)  Stress: No Stress Concern Present (08/27/2021)   [facility] from [facility], [facility]  Tobacco Use: Medium Risk (05/22/2023)    Readmission Risk Interventions     No data to display

## 2023-05-29 ENCOUNTER — Other Ambulatory Visit: Payer: Self-pay

## 2023-05-29 ENCOUNTER — Inpatient Hospital Stay

## 2023-05-29 DIAGNOSIS — D649 Anemia, unspecified: Secondary | ICD-10-CM | POA: Diagnosis not present

## 2023-05-29 MED ORDER — IOHEXOL 9 MG/ML PO SOLN
500.0000 mL | ORAL | Status: AC
  Administered 2023-05-29 (×2): 500 mL via ORAL

## 2023-05-29 MED ORDER — VITAMIN A 3 MG (10000 UNIT) PO CAPS
10000.0000 [IU] | ORAL_CAPSULE | Freq: Every day | ORAL | 0 refills | Status: DC
  Filled 2023-05-29: qty 30, 30d supply, fill #0

## 2023-05-29 MED ORDER — IOHEXOL 300 MG/ML  SOLN
100.0000 mL | Freq: Once | INTRAMUSCULAR | Status: AC | PRN
  Administered 2023-05-29: 100 mL via INTRAVENOUS

## 2023-05-29 NOTE — Plan of Care (Signed)
  Problem: Education: Goal: Knowledge of General Education information will improve Description: Including pain rating scale, medication(s)/side effects and non-pharmacologic comfort measures Outcome: Progressing   Problem: Clinical Measurements: Goal: Ability to maintain clinical measurements within normal limits will improve Outcome: Progressing Goal: Cardiovascular complication will be avoided Outcome: Progressing   Problem: Elimination: Goal: Will not experience complications related to bowel motility Outcome: Progressing

## 2023-05-29 NOTE — Progress Notes (Signed)
 Nutrition Follow-up  DOCUMENTATION CODES:   Severe malnutrition in context of chronic illness  INTERVENTION:   -Continue MVI with minerals daily -Continue 10,000 units vitamin A  daily x 60 days  NUTRITION DIAGNOSIS:   Moderate Malnutrition related to chronic illness (CHF, schizophrenia) as evidenced by moderate fat depletion, severe fat depletion, moderate muscle depletion, severe muscle depletion.  Ongoing  GOAL:   Patient will meet greater than or equal to 90% of their needs  Progressing   MONITOR:   PO intake, Supplement acceptance, Diet advancement  REASON FOR ASSESSMENT:   Consult Assessment of nutrition requirement/status  ASSESSMENT:   Pt with medical history significant of hypothyroidism, GI bleeding, bipolar, schizophrenia, panic disorder, depression with anxiety, anemia, fibromyalgia, breast cancer (s/p for right mastectomy), right lymphedema presenting with weakness, symptomatic anemia, upper GI bleed, heart failure.  Patient reports worsening shortness of breath over multiple weeks.  4/29- s/p colonoscopy- revealed non-bleeding internal hemorrhoids, patent end to side ileo-colonic anastomosis  5/1- s/p EDG- antrectomy found, multiple gastric polyps (biopsied), capsule endoscopy initiated   5/3- s/p small bowel enteroscopy- billroth I anastomosis found characterized by congestion and erythema and polypoid changes at the anastomosis (biopsied), 2 cm hiatal hernia, jejunal mass (biopsied)   Reviewed I/O's: +360 ml x 24 hours and +496 ml since admission  Pt unavailable at time of visit.   Pt on soft diet with continued good appetite. Noted meal completions 50-100%.   Wt has been stable over the past week.   Per TOC notes, received denial for SNF. Plan for home with home health.   Medications reviewed and include synthroid , protonix , and vitamin A .  Labs reviewed: CBGS: 128.    Diet Order:   Diet Order             DIET SOFT Room service appropriate?  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  Estimated Nutritional Needs:   Kcal:  1850-2050  Protein:  90-105 grams  Fluid:  > 1.8 L    Herschel Lords, RD, LDN, CDCES Registered Dietitian III Certified Diabetes Care and Education Specialist If unable to reach this RD, please use "RD Inpatient" group chat on secure chat between hours of 8am-4 pm daily

## 2023-05-29 NOTE — Progress Notes (Signed)
 Progress Note    [name]  [name] DOB: 06/06/1950  DOA: 05/17/2023 PCP: [facility]      Brief Narrative:    Medical records reviewed and are as summarized below:  Elizabeth [name] is a 73 y.o. 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    She presented to the hospital with diarrhea, shortness of breath and general weakness for about 2 weeks duration.  Reportedly, she had some dark stools.  She has also been falling at home.     Assessment/Plan:   Principal Problem:   Symptomatic anemia Active Problems:   Major depressive disorder, recurrent severe without psychotic features (HCC)   GI bleed   Weakness   Heart failure (HCC)   Hypothyroidism   History of essential hypertension   AKI (acute kidney injury) (HCC)   Acute on chronic heart failure with preserved ejection fraction (HFpEF) (HCC)   Protein-calorie malnutrition, severe   Angiodysplasia of small intestine, except duodenum with bleeding   Jejunal polyp    Body mass index is 19.81 kg/m.   Acute GI bleeding, melena: Continue Protonix .  Small bowel enteroscopy 5/3: Pedunculated polypoid mass in jejunum, biopsied.  Will follow biopsies.  No AVMs appreciated.  No bleeding throughout enteroscopy. - Colonoscopy 4/29: Within normal limits -  EGD 4/30: Normal esophagus, multiple gastric polyps which were biopsied, no obvious source of bleeding.   -- Capsule endoscopy:  2 areas in proximal jejunum with stigmata of recent bleeding likely representing angiodysplasias as well as a 1 cm sessile polyp with clean-based ulceration just distal to the proximal jejunum.  Diarrhea has improved. Recent COVID-19 infection on 04/29/2023.   Abdominal pain: Patient complains of severe abdominal pain.  It is possible this may be related to previous surgeries.  Obtain CT scan of abdomen and pelvis with IV contrast for further evaluation.  This was discussed in detail with Elizabeth [name], son, over the phone and he is agreeable with the plan.  Patient is also agreeable to have CT scan so long as Elizabeth [name] is okay with it.  It was explained to both of them that there is some risk of acute kidney injury from IV contrast   Acute blood loss anemia, history of chronic iron deficiency anemia: S/p transfusion 1 unit of PRBCs on 05/17/2023 for hemoglobin of 7.1.  H&H stable. Hemoglobin was 8 on admission. Iron studies: Iron 47, TIBC 122, saturation ratio 39 and ferritin 830.  Vitamin B12- 625   Acute diastolic heart failure: Improved.  No acute issues.  He was seen by cardiologist. S/p treatment with IV Lasix .  BNP 311.6 2D echo showed EF estimated at 55 to 60%, grade 1 diastolic dysfunction, mild MR   AKI: Improved   Hypokalemia: Improved.    Vitamin D  deficiency: Vitamin D  level was 4.5.  Continue vitamin D  supplement. Elevated vitamin D  level (101.94): patient was taking high-dose vitamin D  prior to admission.  This will be discontinued at discharge.  General Weakness: PT recommended discharge to SNF.  Unfortunately, discharge to SNF was not authorized by her medical insurance.  She has decided to go home with home health therapy.  Elizabeth [name], son, is in agreement with this plan   Comorbidities include bipolar disorder, schizophrenia, anxiety, depression,  depression, history of breast cancer s/p mastectomy in February 2023 (on letrozole ).   Plan discussed with Elizabeth [name], son, over the phone.  Plan was also discussed with Elizabeth [name], significant other, at the bedside    Diet Order             DIET SOFT Room service appropriate? Yes; Fluid consistency: Thin  Diet effective now                            Consultants: Gastroenterologist Cardiologist  Procedures: Colonoscopy 05/20/2023 Upper endoscopy 05/22/2023 Video capsule endoscopy 05/22/2023 Small bowel endoscopy 05/24/2023    Medications:    levothyroxine   100 mcg Oral QAC breakfast   pantoprazole   40 mg Oral BID   QUEtiapine   25 mg Oral QHS   sertraline   100 mg Oral QHS   sodium chloride  flush  3 mL Intravenous Q12H   vitamin A   10,000 Units Oral Daily   Continuous Infusions:      Anti-infectives (From admission, onward)    None              Family Communication/Anticipated D/C date and plan/Code Status   DVT prophylaxis: Place and maintain sequential compression device Start: 05/21/23 1602 Place TED hose Start: 05/17/23 1454     Code Status: Full Code  Family Communication: Plan discussed with her significant other at the bedside.  Plan was also discussed with Elizabeth [name], son, over the phone. Disposition Plan: Plan to discharge home   Status is: Inpatient Remains inpatient appropriate because: Abdominal pain         Subjective:   Interval events noted.  She complains of severe abdominal pain.  No diarrhea.  Objective:    Vitals:   05/29/23 0342 05/29/23 0435 05/29/23 0844 05/29/23 1543  BP:   (!) 110/57 111/64  Pulse:  99 100 94  Resp:  18 18 18   Temp:  98.4 F (36.9 C) 98 F (36.7 C) 98 F (36.7 C)  TempSrc:  Oral    SpO2:  94% 96% 99%  Weight: 46 kg     Height:       Orthostatic VS for the past 24 hrs:  BP- Lying Pulse- Lying BP- Sitting Pulse- Sitting BP- Standing at 0 minutes Pulse- Standing at 0 minutes  05/29/23  1543 111/64 94 113/64 108 124/77 111     Intake/Output Summary (Last 24 hours) at 05/29/2023 1641 Last data filed at 05/29/2023 1300 Gross per 24 hour  Intake 0 ml  Output --  Net 0 ml    Filed Weights   05/27/23 0428 05/28/23 0556 05/29/23 0342  Weight: 47 kg 47.1 kg 46 kg    Exam:  GEN: NAD SKIN: Warm and dry EYES: No pallor or icterus ENT: MMM CV: RRR PULM: CTA B ABD: soft, ND, mid abdominal tenderness, +BS CNS: AAO x 3, non focal EXT: No edema or tenderness       Data Reviewed:   I have personally reviewed following labs and imaging studies:  Labs: Labs show the following:   Basic Metabolic Panel: Recent Labs  Lab 05/23/23 0420 05/24/23 0355 05/25/23 0336 05/26/23 0500  NA 135 135 139 140  K 3.9 4.0 4.2 4.5  CL 103 103 105 106  CO2 22 22 24 25   GLUCOSE 91 124* 108* 93  BUN 24* 24* 25* 26*  CREATININE 0.95 0.92 0.88 0.93  CALCIUM  8.1* 8.2* 8.5* 8.4*  MG 2.1 2.0 2.1 2.1  PHOS 3.5 3.1 3.4 3.4   GFR Estimated Creatinine Clearance: 39.3 mL/min (by C-G formula based on SCr of 0.93 mg/dL). Liver Function Tests: Recent Labs  Lab 05/23/23 0420 05/24/23 0355 05/25/23 0336 05/26/23 0500  AST 97* 88* 89* 97*  ALT 25 24 25 25   ALKPHOS 345* 341* 336* 321*  BILITOT 0.8 0.5 0.5 0.7  PROT 6.3* 6.3* 6.1* 6.0*  ALBUMIN  2.2* 2.0* 1.9* 1.9*   No results for input(s): "LIPASE", "AMYLASE" in the last 168 hours.  No results for input(s): "AMMONIA" in the last 168 hours. Coagulation profile No results for input(s): "INR", "PROTIME" in the last 168 hours.  CBC: Recent Labs  Lab 05/23/23 0420 05/24/23 0355 05/25/23 0336 05/26/23 0500  WBC 7.6 8.1 7.6 7.5  NEUTROABS 5.4 5.5 5.1 6.1  HGB 9.5* 9.4* 9.2* 9.5*  HCT 29.7* 29.4* 29.0* 30.2*  MCV 89.2 89.1 89.0 89.6  PLT 375 370 350 367   Cardiac Enzymes: No results for input(s): "CKTOTAL", "CKMB", "CKMBINDEX", "TROPONINI" in the last 168 hours. BNP (last 3 results) No results for input(s): "PROBNP" in  the last 8760 hours. CBG: No results for input(s): "GLUCAP" in the last 168 hours. D-Dimer: No results for input(s): "DDIMER" in the last 72 hours. Hgb A1c: No results for input(s): "HGBA1C" in the last 72 hours. Lipid Profile: No results for input(s): "CHOL", "HDL", "LDLCALC", "TRIG", "CHOLHDL", "LDLDIRECT" in the last 72 hours. Thyroid function studies: No results for input(s): "TSH", "T4TOTAL", "T3FREE", "THYROIDAB" in the last 72 hours.  Invalid input(s): "FREET3"  Anemia work up: No results for input(s): "VITAMINB12", "FOLATE", "FERRITIN", "TIBC", "IRON", "RETICCTPCT" in the last 72 hours.  Sepsis Labs: Recent Labs  Lab 05/23/23 0420 05/24/23 0355 05/25/23 0336 05/26/23 0500  WBC 7.6 8.1 7.6 7.5    Microbiology No results found for this or any previous visit (from the past 240 hours).   Procedures and diagnostic studies:  No results found.              LOS: 6 days   Libby Goehring  [facility]   Pager on [email or web address]. If 7PM-7AM, please contact night-coverage at [email or web address]     05/29/2023, 4:41 PM

## 2023-05-30 DIAGNOSIS — D649 Anemia, unspecified: Secondary | ICD-10-CM | POA: Diagnosis not present

## 2023-05-30 DIAGNOSIS — C787 Secondary malignant neoplasm of liver and intrahepatic bile duct: Secondary | ICD-10-CM | POA: Diagnosis present

## 2023-05-30 DIAGNOSIS — C7951 Secondary malignant neoplasm of bone: Secondary | ICD-10-CM | POA: Diagnosis present

## 2023-05-30 LAB — BASIC METABOLIC PANEL WITH GFR
Anion gap: 11 (ref 5–15)
BUN: 26 mg/dL — ABNORMAL HIGH (ref 8–23)
CO2: 24 mmol/L (ref 22–32)
Calcium: 8.3 mg/dL — ABNORMAL LOW (ref 8.9–10.3)
Chloride: 105 mmol/L (ref 98–111)
Creatinine, Ser: 0.83 mg/dL (ref 0.44–1.00)
GFR, Estimated: >60 mL/min (ref >60)
Glucose, Bld: 90 mg/dL (ref 70–99)
Potassium: 3.8 mmol/L (ref 3.5–5.1)
Sodium: 140 mmol/L (ref 135–145)

## 2023-05-30 LAB — CBC
HCT: 28 % — ABNORMAL LOW (ref 36.0–46.0)
Hemoglobin: 8.8 g/dL — ABNORMAL LOW (ref 12.0–15.0)
MCH: 28.2 pg (ref 26.0–34.0)
MCHC: 31.4 g/dL (ref 30.0–36.0)
MCV: 89.7 fL (ref 80.0–100.0)
Platelets: 367 10*3/uL (ref 150–400)
RBC: 3.12 MIL/uL — ABNORMAL LOW (ref 3.87–5.11)
RDW: 20 % — ABNORMAL HIGH (ref 11.5–15.5)
WBC: 7.6 10*3/uL (ref 4.0–10.5)
nRBC: 0.3 % — ABNORMAL HIGH (ref 0.0–0.2)

## 2023-05-30 NOTE — Progress Notes (Signed)
 OT Cancellation Note  Patient Details Name: Elizabeth [name] MRN: [id number] DOB: May 23, 1950   Cancelled Treatment:    Reason Eval/Treat Not Completed: Other (comment). RN in room going over discharge. Unavailable for OT tx.  Berenda Breaker., MPH, MS, OTR/L ascom [phone] 05/30/23, 11:53 AM

## 2023-05-30 NOTE — Plan of Care (Signed)

## 2023-05-30 NOTE — Discharge Summary (Addendum)
 Physician Discharge Summary   Patient: Elizabeth [name] MRN: [id number] DOB: 12/24/1950  Admit date:     05/17/2023  Discharge date: 05/30/23  Discharge Physician: Sheril Dines   PCP: [facility]   Recommendations at discharge:    Follow up with PCP in 1 week Follow-up with Dr. 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 *  Hospital Course:  Elizabeth [name] is a 73 y.o. 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     She presented to the hospital with diarrhea, shortness of breath and general weakness for about 2 weeks duration.  Reportedly, she had some dark stools.  She has also been falling at home.      Assessment  and Plan:   Acute GI bleeding, melena: Small bowel enteroscopy 5/3: Pedunculated polypoid mass in jejunum, biopsied.  Will follow biopsies.  No AVMs appreciated.  No bleeding throughout enteroscopy. - Colonoscopy 4/29: Within normal limits - EGD 4/30: Normal esophagus, multiple gastric polyps which were biopsied, no obvious source of bleeding.   -- Capsule endoscopy:  2 areas in proximal jejunum with stigmata of recent bleeding likely representing angiodysplasias as well as a 1 cm sessile polyp with clean-based ulceration just distal to the proximal jejunum.   Diarrhea has improved. Recent COVID-19 infection on 04/29/2023.     Abdominal pain, back pain: Analgesics as needed for pain Metastatic disease to the liver, and thoracic and lumbar spine: CT abdomen pelvis showed metastatic disease to the liver, and thoracic and lumbar spine.  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  She is status post right mastectomy in February 2023. In December 2023, CT chest, abdomen and pelvis showed possible metastasis to right axillary lymph node.  She saw Dr. Adrian Alba for follow-up in November 2024. Continue Letrozole  Please follow-up with Dr. Adrian Alba, oncologist, for further management     Acute blood loss anemia, history of chronic iron deficiency anemia, anemia of chronic disease: S/p transfusion 1 unit of PRBCs on 05/17/2023 for hemoglobin of 7.1.  Hemoglobin down from 9.5-8.8.  Asymptomatic. Recommend outpatient follow-up with oncologist for close monitoring. Hemoglobin was 8 on admission. Iron studies: Iron 47, TIBC 122, saturation ratio 39 and ferritin 830.  Vitamin B12- 625 Continue ferrous sulfate     Acute diastolic heart failure: Improved.  No acute issues.  She was seen by cardiologist. S/p treatment with IV Lasix .  BNP 311.6 2D echo showed EF estimated at 55 to 60%, grade 1 diastolic dysfunction, mild MR     AKI:  Improved     Hypokalemia: Improved.      Vitamin D  deficiency: Vitamin D  level was 4.5.  Continue vitamin D  supplement. Elevated vitamin D  level (101.94): patient was taking high-dose vitamin D  prior to admission.  This will be discontinued at discharge.     General Weakness: PT recommended discharge to SNF.  Unfortunately, discharge to SNF was not authorized by her medical insurance.  She has decided to go home with home health therapy.  [name], son, is in agreement with this plan     Comorbidities include bipolar disorder, schizophrenia, anxiety, depression, depression, history of breast cancer s/p mastectomy in February 2023 (on letrozole ).   She is deemed medically stable for discharge to home with home health therapy today.  Discharge plan was discussed with [name], son, over the phone.  Discharge plan was also discussed with patient and [name], significant other, at the bedside.  She requested a visit from the chaplain prior to discharge.        Consultants: Gastroenterologist, cardiologist Procedures performed:  Colonoscopy 05/20/2023 Upper endoscopy 05/22/2023 Video capsule endoscopy 05/22/2023 Small bowel endoscopy 05/24/2023  Disposition: Home health Diet recommendation:  Discharge Diet Orders (From admission, onward)     Start     Ordered   05/30/23 0000  Diet general        05/30/23 1033           Regular diet DISCHARGE MEDICATION: Allergies as of 05/30/2023       Reactions   Penicillins Hives   TOLERATED CEFAZOLIN         Medication List     STOP taking these medications    lidocaine  4 % cream Commonly known as: LMX   Vitamin D  (Ergocalciferol ) 1.25 MG (50000 UNIT) Caps capsule Commonly known as: DRISDOL        TAKE these medications    acetaminophen  325 MG tablet Commonly known as: TYLENOL  Take 2 tablets (650 mg total) by mouth every 6 (six) hours as needed for mild pain, fever or headache.   alendronate  70 MG tablet Commonly known as:  FOSAMAX  Take 1 tablet by mouth every Monday.   ferrous sulfate  325 (65 FE) MG tablet Take 1 tablet by mouth daily with breakfast.   letrozole  2.5 MG tablet Commonly known as: FEMARA  Take 1 tablet (2.5 mg total) by mouth daily.   levothyroxine  88 MCG tablet Commonly known as: SYNTHROID  Take 88 mcg by mouth daily before breakfast.   midodrine  10 MG tablet Commonly known as: PROAMATINE  Take 1 tablet (10 mg total) by mouth 3 (three) times daily with meals.   QUEtiapine  25 MG tablet Commonly known as: SEROQUEL  Take 1 tablet (25 mg total) by mouth at bedtime.   sertraline  100 MG tablet Commonly known as: ZOLOFT  Take 1 tablet (100 mg total) by mouth at bedtime. Start taking on: Jun 08, 2023   vitamin A  3 MG (10000 UNITS) capsule Take 1 capsule (10,000 Units total) by mouth daily.        Follow-up Information     [place]. Go on 06/09/2023.   Why: Monday, May 19 at 1:00 p.m. hospital followup Contact information: [address] [place] [place] [address] [phone]                Discharge Exam: Cleavon Curls Weights  05/28/23 0556 05/29/23 0342 05/30/23 0508  Weight: 47.1 kg 46 kg 46.8 kg   GEN: NAD SKIN: Warm and dry EYES: No pallor or icterus ENT: MMM CV: RRR PULM: CTA B ABD: soft, ND, NT, +BS CNS: AAO x 3, non focal EXT: No edema or tenderness   Condition at discharge: stable  The results of significant diagnostics from this hospitalization (including imaging, microbiology, ancillary and laboratory) are listed below for reference.   Imaging Studies: CT ABDOMEN PELVIS W CONTRAST Result Date: 05/29/2023 CLINICAL DATA:  Generalized abdominal pain. History of breast cancer. EXAM: CT ABDOMEN AND PELVIS WITH CONTRAST TECHNIQUE: Multidetector CT imaging of the abdomen and pelvis was performed using the standard protocol following bolus administration of intravenous contrast. RADIATION DOSE REDUCTION: This exam was performed according to the  departmental dose-optimization program which includes automated exposure control, adjustment of the mA and/or kV according to patient size and/or use of iterative reconstruction technique. CONTRAST:  OMNIPAQUE  IOHEXOL  300 MG/ML  SOLN COMPARISON:  January 29, 2022 FINDINGS: Lower chest: Minimal right pleural effusion is noted. Hepatobiliary: Status post cholecystectomy. Interval development of multiple irregular and peripherally enhancing lesions throughout the left and right hepatic lobes consistent with metastatic disease. The largest measures 3.2 cm in right hepatic lobe. Pancreas: Unremarkable. No pancreatic ductal dilatation or surrounding inflammatory changes. Spleen: Normal in size without focal abnormality. Adrenals/Urinary Tract: Adrenal glands appear normal. Stable left renal cyst. No hydronephrosis or renal obstruction is noted. Urinary bladder is unremarkable. Stomach/Bowel: Status post partial gastrectomy. Status post appendectomy. No evidence of bowel obstruction or inflammation. Vascular/Lymphatic: No significant vascular findings are present. No enlarged abdominal or pelvic lymph nodes. Reproductive: Status post hysterectomy. No adnexal masses. Other: No ascites or hernia. Musculoskeletal: Interval development of diffuse sclerotic densities throughout the July spine and pelvis concerning for osseous metastases. There is interval development of multiple mild compression deformities involving the visualized thoracic and lumbar spine most likely representing old pathologic fractures related to metastatic disease. No definite acute fracture is noted. IMPRESSION: Multiple hepatic metastatic lesions are noted. Diffuse osseous metastases are also noted with multiple old compression fractures seen in visualized thoracic and lumbar spine. Electronically Signed   By: Rosalene Colon M.D.   On: 05/29/2023 17:16   ECHOCARDIOGRAM COMPLETE Result Date: 05/19/2023    ECHOCARDIOGRAM REPORT   Patient Name:    ELLISEN VIVIRITO [facility] Date of Exam: 05/18/2023 Medical Rec #:  [id number]           Height:       60.0 in Accession #:    [id number]          Weight:       92.4 lb Date of Birth:  [birth year]          BSA:          1.344 m Patient Age:    72 years            BP:           0/0 mmHg Patient Gender: F                   HR:           90 bpm. Exam Location:  [facility] Procedure: 2D Echo, Cardiac Doppler, Color Doppler and Strain Analysis (Both            Spectral and Color Flow Doppler were utilized during procedure). Indications:     CHF- Acute Diastolic I50.31  History:  Patient has no prior history of Echocardiogram examinations.  Sonographer:     Jane Meager RDCS Referring Phys:  [id number] [place] Diagnosing Phys: Antionette Kirks MD  Sonographer Comments: Image acquisition challenging due to mastectomy and Image acquisition challenging due to respiratory motion. IMPRESSIONS  1. Left ventricular ejection fraction, by estimation, is 55 to 60%. The left ventricle has normal function. The left ventricle has no regional wall motion abnormalities. Left ventricular diastolic parameters are consistent with Grade I diastolic dysfunction (impaired relaxation). The average left ventricular global longitudinal strain is -17.4 %. The global longitudinal strain is normal.  2. Right ventricular systolic function is normal. The right ventricular size is normal. There is normal pulmonary artery systolic pressure. The estimated right ventricular systolic pressure is 23.4 mmHg.  3. The mitral valve is normal in structure. Mild mitral valve regurgitation. No evidence of mitral stenosis.  4. The aortic valve is normal in structure. Aortic valve regurgitation is mild. Aortic valve sclerosis is present, with no evidence of aortic valve stenosis.  5. The inferior vena cava is normal in size with greater than 50% respiratory variability, suggesting right atrial pressure of 3 mmHg. FINDINGS  Left Ventricle: Left ventricular ejection fraction,  by estimation, is 55 to 60%. The left ventricle has normal function. The left ventricle has no regional wall motion abnormalities. The average left ventricular global longitudinal strain is -17.4 %. Strain was performed and the global longitudinal strain is normal. The left ventricular internal cavity size was normal in size. There is borderline left ventricular hypertrophy. Left ventricular diastolic parameters are consistent with Grade I diastolic  dysfunction (impaired relaxation). Right Ventricle: The right ventricular size is normal. No increase in right ventricular wall thickness. Right ventricular systolic function is normal. There is normal pulmonary artery systolic pressure. The tricuspid regurgitant velocity is 2.26 m/s, and  with an assumed right atrial pressure of 3 mmHg, the estimated right ventricular systolic pressure is 23.4 mmHg. Left Atrium: Left atrial size was normal in size. Right Atrium: Right atrial size was normal in size. Pericardium: There is no evidence of pericardial effusion. Mitral Valve: The mitral valve is normal in structure. Mild mitral valve regurgitation. No evidence of mitral valve stenosis. Tricuspid Valve: The tricuspid valve is normal in structure. Tricuspid valve regurgitation is mild . No evidence of tricuspid stenosis. Aortic Valve: The aortic valve is normal in structure. Aortic valve regurgitation is mild. Aortic regurgitation PHT measures 479 msec. Aortic valve sclerosis is present, with no evidence of aortic valve stenosis. Aortic valve peak gradient measures 10.2 mmHg. Pulmonic Valve: The pulmonic valve was normal in structure. Pulmonic valve regurgitation is not visualized. No evidence of pulmonic stenosis. Aorta: The aortic root is normal in size and structure. Venous: The inferior vena cava is normal in size with greater than 50% respiratory variability, suggesting right atrial pressure of 3 mmHg. IAS/Shunts: No atrial level shunt detected by color flow Doppler.   LEFT VENTRICLE PLAX 2D LVIDd:         4.20 cm   Diastology LVIDs:         2.90 cm   LV e' medial:    10.20 cm/s LV PW:         0.90 cm   LV E/e' medial:  8.1 LV IVS:        1.10 cm   LV e' lateral:   10.20 cm/s LVOT diam:     2.00 cm   LV E/e' lateral: 8.1 LV SV:  77 LV SV Index:   57        2D Longitudinal Strain LVOT Area:     3.14 cm  2D Strain GLS Avg:     -17.4 %  RIGHT VENTRICLE RV Basal diam:  3.00 cm RV S prime:     14.70 cm/s TAPSE (M-mode): 2.1 cm LEFT ATRIUM             Index        RIGHT ATRIUM           Index LA diam:        4.30 cm 3.20 cm/m   RA Area:     14.10 cm LA Vol (A2C):   29.0 ml 21.57 ml/m  RA Volume:   32.90 ml  24.48 ml/m LA Vol (A4C):   32.5 ml 24.18 ml/m LA Biplane Vol: 31.0 ml 23.06 ml/m  AORTIC VALVE                 PULMONIC VALVE AV Area (Vmax): 2.73 cm     PV Vmax:        1.14 m/s AV Vmax:        160.00 cm/s  PV Peak grad:   5.2 mmHg AV Peak Grad:   10.2 mmHg    RVOT Peak grad: 3 mmHg LVOT Vmax:      139.00 cm/s LVOT Vmean:     95.200 cm/s LVOT VTI:       0.244 m AI PHT:         479 msec  AORTA Ao Root diam: 2.70 cm Ao Asc diam:  3.10 cm MITRAL VALVE                TRICUSPID VALVE MV Area (PHT): 4.71 cm     TR Peak grad:   20.4 mmHg MV Decel Time: 161 msec     TR Vmax:        226.00 cm/s MV E velocity: 82.30 cm/s MV A velocity: 105.00 cm/s  SHUNTS MV E/A ratio:  0.78         Systemic VTI:  0.24 m                             Systemic Diam: 2.00 cm Antionette Kirks MD Electronically signed by Antionette Kirks MD Signature Date/Time: 05/19/2023/11:01:28 AM    Final    CT HEAD WO CONTRAST ( ) Result Date: 05/17/2023 CLINICAL DATA:  Head trauma. Generalized weakness. Recent unwitnessed fall. EXAM: CT HEAD WITHOUT CONTRAST TECHNIQUE: Contiguous axial images were obtained from the base of the skull through the vertex without intravenous contrast. RADIATION DOSE REDUCTION: This exam was performed according to the departmental dose-optimization program which includes automated  exposure control, adjustment of the mA and/or kV according to patient size and/or use of iterative reconstruction technique. COMPARISON:  CT head without contrast 08/16/2022. FINDINGS: Brain: Periventricular and scattered subcortical white matter hypoattenuation is mildly advanced for age. This is similar the prior exam. No acute infarct, hemorrhage, or mass lesion is present. Deep brain nuclei are within normal limits. The ventricles are of normal size. No significant extraaxial fluid collection is present. The brainstem and cerebellum are within normal limits. Midline structures are within normal limits. Vascular: Faint atherosclerotic calcifications are present within the cavernous internal carotid arteries and at the dural margin of the right vertebral artery. No hyperdense vessel is present. Skull: Calvarium is intact. No focal lytic or blastic lesions are present.  No significant extracranial soft tissue lesion is present. Sinuses/Orbits: A fluid level is present left maxillary sinus scratched at a fluid level is present in the left sphenoid sinus. Minimal mucosal thickening is present in the right sphenoid sinus. The paranasal sinuses and mastoid air cells are otherwise clear. The globes and orbits are within normal limits. IMPRESSION: 1. No acute intracranial abnormality or significant interval change. 2. Periventricular and scattered subcortical white matter hypoattenuation is mildly advanced for age. This likely reflects the sequela of chronic microvascular ischemia. 3. Fluid levels in the left maxillary and left sphenoid sinuses. This may reflect acute sinusitis in the appropriate clinical setting. Electronically Signed   By: Audree Leas M.D.   On: 05/17/2023 14:10   DG Chest Portable 1 View Result Date: 05/17/2023 CLINICAL DATA:  Weakness.  Recent episode of COVID. EXAM: PORTABLE CHEST 1 VIEW COMPARISON:  One-view chest x-ray 03/03/2021. FINDINGS: The heart is enlarged. Moderate pulmonary  vascular congestion is present bilaterally. Small effusions are suspected. No significant airspace consolidation is present. The visualized soft tissues and bony thorax are unremarkable. IMPRESSION: 1. Cardiomegaly and moderate pulmonary vascular congestion. 2. Small effusions. Electronically Signed   By: Audree Leas M.D.   On: 05/17/2023 14:06   DG Pelvis 1-2 Views Result Date: 05/17/2023 CLINICAL DATA:  Fall.  Dementia. EXAM: PELVIS - 1-2 VIEW COMPARISON:  09/03/2020 FINDINGS: Single AP view of the pelvis demonstrates marked left and moderate right hip joint space narrowing with subchondral sclerosis. Given mild osteopenia, no acute fracture identified. Degenerative sclerosis of the right sacroiliac joint. Remote trauma or degenerative irregularity of the left symphysis pubis is new since 2022. IMPRESSION: Degenerative change, without acute osseous finding. Electronically Signed   By: Lore Rode M.D.   On: 05/17/2023 14:05    Microbiology: Results for orders placed or performed during the hospital encounter of 05/17/23  C Difficile Quick Screen w PCR reflex     Status: None   Collection Time: 05/18/23  8:12 AM   Specimen: STOOL  Result Value Ref Range Status   C Diff antigen NEGATIVE NEGATIVE Final   C Diff toxin NEGATIVE NEGATIVE Final   C Diff interpretation No C. difficile detected.  Final    Comment: Performed at [facility], [address] [place], [place] [address]  Gastrointestinal Panel by PCR , Stool     Status: None   Collection Time: 05/18/23 11:00 AM   Specimen: Stool  Result Value Ref Range Status   Campylobacter species NOT DETECTED NOT DETECTED Final   Plesimonas shigelloides NOT DETECTED NOT DETECTED Final   Salmonella species NOT DETECTED NOT DETECTED Final   Yersinia enterocolitica NOT DETECTED NOT DETECTED Final   Vibrio species NOT DETECTED NOT DETECTED Final   Vibrio cholerae NOT DETECTED NOT DETECTED Final   Enteroaggregative E coli (EAEC) NOT  DETECTED NOT DETECTED Final   Enteropathogenic E coli (EPEC) NOT DETECTED NOT DETECTED Final   Enterotoxigenic E coli (ETEC) NOT DETECTED NOT DETECTED Final   Shiga like toxin producing E coli (STEC) NOT DETECTED NOT DETECTED Final   Shigella/Enteroinvasive E coli (EIEC) NOT DETECTED NOT DETECTED Final   Cryptosporidium NOT DETECTED NOT DETECTED Final   Cyclospora cayetanensis NOT DETECTED NOT DETECTED Final   Entamoeba histolytica NOT DETECTED NOT DETECTED Final   Giardia lamblia NOT DETECTED NOT DETECTED Final   Adenovirus F40/41 NOT DETECTED NOT DETECTED Final   Astrovirus NOT DETECTED NOT DETECTED Final   Norovirus GI/GII NOT DETECTED NOT DETECTED Final  Rotavirus A NOT DETECTED NOT DETECTED Final   Sapovirus (I, II, IV, and V) NOT DETECTED NOT DETECTED Final    Comment: Performed at [facility], [address] [place], [place] [address]    Labs: CBC: Recent Labs  Lab 05/24/23 0355 05/25/23 0336 05/26/23 0500 05/30/23 0450  WBC 8.1 7.6 7.5 7.6  NEUTROABS 5.5 5.1 6.1  --   HGB 9.4* 9.2* 9.5* 8.8*  HCT 29.4* 29.0* 30.2* 28.0*  MCV 89.1 89.0 89.6 89.7  PLT 370 350 367 367   Basic Metabolic Panel: Recent Labs  Lab 05/24/23 0355 05/25/23 0336 05/26/23 0500 05/30/23 0450  NA 135 139 140 140  K 4.0 4.2 4.5 3.8  CL 103 105 106 105  CO2 22 24 25 24   GLUCOSE 124* 108* 93 90  BUN 24* 25* 26* 26*  CREATININE 0.92 0.88 0.93 0.83  CALCIUM  8.2* 8.5* 8.4* 8.3*  MG 2.0 2.1 2.1  --   PHOS 3.1 3.4 3.4  --    Liver Function Tests: Recent Labs  Lab 05/24/23 0355 05/25/23 0336 05/26/23 0500  AST 88* 89* 97*  ALT 24 25 25   ALKPHOS 341* 336* 321*  BILITOT 0.5 0.5 0.7  PROT 6.3* 6.1* 6.0*  ALBUMIN  2.0* 1.9* 1.9*   CBG: No results for input(s): "GLUCAP" in the last 168 hours.  Discharge time spent: greater than 30 minutes.  Signed: Sheril Dines, MD [facility] 05/30/2023

## 2023-05-30 NOTE — Progress Notes (Signed)
   05/30/23 1200  Spiritual Encounters  Type of Visit Initial  Care provided to: Pt and family  Referral source Patient request  Reason for visit Routine spiritual support  OnCall Visit No  Interventions  Spiritual Care Interventions Made Established relationship of care and support;Compassionate presence  Intervention Outcomes  Outcomes Connection to spiritual care   Chaplain provided compassionate presence and prayed with pt and family. Family member stated they were preparing to be discharged. Had fears of the unknown connected to cancer diagnosis.

## 2023-06-08 NOTE — Progress Notes (Signed)
 [facility] MD/PA/NP OP Progress Note  06/12/2023 5:31 PM Elizabeth [name]  MRN:  [id number]  Chief Complaint:  Chief Complaint  Patient presents with   Follow-up   HPI:  - she is not seen since Dec 2024 - According to the chart review, the following events have occurred since the last visit: The patient was admitted for melena. 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 - Found to have hypothyroidism- levothyroxine  was uptitrated to 100 mcg  This is a follow-up appointment for bipolar disorder and insomnia. She presents to the visit with her caregiver, [name]. The most of the history is obtained with help with [facility].  She states that she feels terrible. 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 Although she was recommended at rehab, she could not afford it. She spends time, watching TV.  Although she reports occasional depressed mood, she denies SI.  She denies hallucinations. Although she initially declines to continue sertraline , she later agrees with the plans as below.   [name] states that she was his ex-girlfriend. Nursing aid helps her 8 am-5 pm, five days a week. He or her son comes at night to take care of her.  Although she is to be self-sufficient prior to the admission, she needs a little more help.  He thinks she sleeps well at night except she has middle insomnia.  He recognizes that she is "mean," referring to her interaction during the visit.    Communicated with [name], her son on the phone.  He states that things are going very well.  She has stomach issues, and had an admission.  She will be seen by oncology for metastasis.  He thinks her behavior is better, overall about the same since the last visit.  However, he thinks she had some behavior issues today, and wonders if it might be  related to being off sertraline  for a week during the admission.  He denies any other concern, and feels comfortable to stay on the medication.    Wt Readings from Last 3 Encounters:  06/12/23 95 lb 6.4 oz (43.3 kg)  05/30/23 103 lb 2.8 oz (46.8 kg)  02/05/23 118 lb 6 oz (53.7 kg)     Visit Diagnosis:    ICD-10-CM   1. Bipolar affective disorder, currently depressed, moderate (HCC)  F31.32     2. Insomnia, unspecified type  G47.00       Past Psychiatric History: Please see initial evaluation for full details. I have reviewed the history. No updates at this time.     Past Medical History:  Past Medical History:  Diagnosis Date   Acute cholecystitis 2016   Anemia    Anemia 2016   Anxiety    panic attacks   Anxiety and depression    Arthritis    Bipolar 1 disorder (HCC)    Bowel obstruction (HCC) 2017   Common bile duct dilatation 2015   Dementia (HCC)    Dyspnea    Edema    Fibromyalgia    GI bleed 2017   History of kidney stones 01/21/2009   HSV-1 infection    Hyperglycemia    Hyperlipidemia    Hypokalemia    Hypothyroidism    Liver cyst    Malignant neoplasm of right female breast, unspecified estrogen receptor status, unspecified site of breast (  HCC)    Memory loss    Osteopenia    Panic disorder    Schizophrenia (HCC)    Ventral hernia     Past Surgical History:  Procedure Laterality Date   ABDOMINAL HYSTERECTOMY     APPENDECTOMY     BREAST BIOPSY Right 01/19/2021   US  Bx, ribbon, path pending   BREAST BIOPSY Right 01/19/2021   US  Bx, Venus, path pending   BREAST BIOPSY Right 01/19/2021   US  Bx, heart, path pending   BREAST BIOPSY Right 01/19/2021   US  BX Axilla, Coil, path pending   CESAREAN SECTION     CHOLECYSTECTOMY OPEN  11/17/2013   with repair of bile duct-Dr. Adele Admire   COLONOSCOPY N/A 05/20/2023   Procedure: COLONOSCOPY;  Surgeon: [name], MD;  Location: [facility] ENDOSCOPY;  Service: Endoscopy;  Laterality: N/A;   ENTEROSCOPY N/A 05/24/2023    Procedure: ENTEROSCOPY;  Surgeon: Toledo, Alphonsus Jeans, MD;  Location: [facility] ENDOSCOPY;  Service: Gastroenterology;  Laterality: N/A;   ESOPHAGOGASTRODUODENOSCOPY N/A 05/22/2023   Procedure: EGD (ESOPHAGOGASTRODUODENOSCOPY);  Surgeon: [name], MD;  Location: [facility] ENDOSCOPY;  Service: Endoscopy;  Laterality: N/A;   GIVENS CAPSULE STUDY  05/22/2023   Procedure: IMAGING PROCEDURE, GI TRACT, INTRALUMINAL, VIA CAPSULE;  Surgeon: [name], MD;  Location: [facility] ENDOSCOPY;  Service: Endoscopy;;   MASTECTOMY MODIFIED RADICAL Right 03/02/2021   Procedure: MASTECTOMY MODIFIED RADICAL;  Surgeon: Conrado Delay, DO;  Location: [facility] ORS;  Service: General;  Laterality: Right;   OPEN REDUCTION INTERNAL FIXATION (ORIF) DISTAL RADIAL FRACTURE Right 12/20/2014   Procedure: OPEN REDUCTION INTERNAL FIXATION (ORIF) RIGHT DISTAL RADIAL FRACTURE;  Surgeon: Saundra Curl, MD;  Location: [facility];  Service: Orthopedics;  Laterality: Right;   STOMACH SURGERY      Family Psychiatric History: Please see initial evaluation for full details. I have reviewed the history. No updates at this time.     Family History:  Family History  Problem Relation Age of Onset   Heart disease Father    Breast cancer Paternal Aunt    Kidney disease Paternal Aunt    Breast cancer Paternal Grandmother    Asthma Daughter    Bladder Cancer Neg Hx     Social History:  Social History   Socioeconomic History   Marital status: Married    Spouse name: Not on file   Number of children: 1   Years of education: Not on file   Highest education level: Not on file  Occupational History   Not on file  Tobacco Use   Smoking status: Former    Current packs/day: 0.00    Types: Cigarettes    Quit date: 11/03/1973    Years since quitting: 49.6    Passive exposure: Past   Smokeless tobacco: Never   Tobacco comments:    Smoked briefly as a teen- lives in secondhand smoke.   Vaping Use   Vaping status: Never Used   Substance and Sexual Activity   Alcohol use: No    Alcohol/week: 0.0 standard drinks of alcohol   Drug use: No   Sexual activity: Not Currently  Other Topics Concern   Not on file  Social History Narrative   Not on file   Social Drivers of Health   Financial Resource Strain: Low Risk  (08/20/2022)   Received from [facility]   Overall Financial Resource Strain (CARDIA)    Difficulty of Paying Living Expenses: Not hard at all  Food Insecurity: No Food Insecurity (05/17/2023)  Hunger Vital Sign    Worried About Running Out of Food in the Last Year: Never true    Ran Out of Food in the Last Year: Never true  Transportation Needs: No Transportation Needs (05/17/2023)   PRAPARE - Administrator, Civil Service (Medical): No    Lack of Transportation (Non-Medical): No  Physical Activity: Insufficiently Active (08/06/2021)   [facility], [facility]   Exercise Vital Sign    Days of Exercise per Week: 2 days    Minutes of Exercise per Session: 30 min  Stress: No Stress Concern Present (08/27/2021)   Received from [facility], [facility]   [employer] of Occupational Health - Occupational Stress Questionnaire    Feeling of Stress : Only a little  Social Connections: Socially Isolated (05/17/2023)   Social Connection and Isolation Panel [NHANES]    Frequency of Communication with Friends and Family: More than three times a week    Frequency of Social Gatherings with Friends and Family: Twice a week    Attends Religious Services: Never    Database administrator or Organizations: No    Attends Banker Meetings: Never    Marital Status: Widowed    Allergies:  Allergies  Allergen Reactions   Penicillins Hives    TOLERATED CEFAZOLIN     Metabolic Disorder Labs: No results found for: "HGBA1C", "MPG" No results found for: "PROLACTIN" No results found  for: "CHOL", "TRIG", "HDL", "CHOLHDL", "VLDL", "LDLCALC" Lab Results  Component Value Date   TSH 5.246 (H) 05/17/2023    Therapeutic Level Labs: No results found for: "LITHIUM" Lab Results  Component Value Date   VALPROATE <10 (L) 07/18/2021   No results found for: "CBMZ"  Current Medications: Current Outpatient Medications  Medication Sig Dispense Refill   acetaminophen  (TYLENOL ) 325 MG tablet Take 2 tablets (650 mg total) by mouth every 6 (six) hours as needed for mild pain, fever or headache.     alendronate  (FOSAMAX ) 70 MG tablet Take 1 tablet by mouth every Monday.     ferrous sulfate  325 (65 FE) MG tablet Take 1 tablet by mouth daily with breakfast.     letrozole  (FEMARA ) 2.5 MG tablet Take 1 tablet (2.5 mg total) by mouth daily. 90 tablet 3   levothyroxine  (SYNTHROID , LEVOTHROID) 88 MCG tablet Take 88 mcg by mouth daily before breakfast.     midodrine  (PROAMATINE ) 10 MG tablet Take 1 tablet (10 mg total) by mouth 3 (three) times daily with meals. 90 tablet 1   QUEtiapine  (SEROQUEL ) 25 MG tablet Take 1 tablet (25 mg total) by mouth at bedtime. 90 tablet 0   sertraline  (ZOLOFT ) 100 MG tablet Take 1 tablet (100 mg total) by mouth at bedtime. 90 tablet 0   vitamin A  3 MG (10000 UNITS) capsule Take 1 capsule (10,000 Units total) by mouth daily. 30 capsule 0   No current facility-administered medications for this visit.     Musculoskeletal: Strength & Muscle Tone: within normal limits Gait & Station: using a walker Patient leans: N/A  Psychiatric Specialty Exam: Review of Systems  Psychiatric/Behavioral:  Positive for behavioral problems and sleep disturbance. Negative for agitation, confusion, decreased concentration, dysphoric mood, hallucinations, self-injury and suicidal ideas. The patient is not nervous/anxious and is not hyperactive.   All other systems reviewed and are negative.   Blood pressure 118/64, pulse (!) 125, temperature 97.8 F (36.6 C), temperature source  Temporal, height 5' (  1.524 m), weight 95 lb 6.4 oz (43.3 kg), SpO2 94%.Body mass index is 18.63 kg/m.  General Appearance: Well Groomed  Eye Contact:  Good  Speech:  Clear and Coherent  Volume:  Normal  Mood:  terrible  Affect:  Appropriate, Congruent, and Restricted  Thought Process:  Coherent  Orientation:  Full (Time, Place, and Person)  Thought Content: Logical   Suicidal Thoughts:  No  Homicidal Thoughts:  No  Memory:  Immediate;   Good  Judgement:  Fair  Insight:  Present  Psychomotor Activity:  Normal, Normal tone, no rigidity, no resting/postural tremors on left arm (right arm not able to examine)  Concentration:  Concentration: Good and Attention Span: Good  Recall:  Good  Fund of Knowledge: Good  Language: Good  Akathisia:  No  Handed:  Right  AIMS (if indicated): 0 (able to access gait, using walker)  Assets:  Social Support  ADL's:  Intact  Cognition: WNL  Sleep:  Fair   Screenings: GAD-7    Garment/textile technologist Visit from 07/22/2022 in [facility] Visit from 02/25/2022 in [facility] Visit from 12/31/2021 in [facility] Visit from 11/05/2021 in [facility]  Total GAD-7 Score 21 19 18 21       PHQ2-9    Flowsheet Row Office Visit from 07/22/2022 in [facility] Visit from 04/22/2022 in [facility] Visit from 02/25/2022 in [facility] Visit from 12/31/2021 in [facility] Office Visit from 11/05/2021 in [facility]  PHQ-2 Total Score 6 6 6 4 2   PHQ-9 Total Score 27 22 18 16 11       Flowsheet Row ED to Hosp-Admission (Discharged) from 05/17/2023 in [facility] 1C MEDICAL TELEMETRY ED to Hosp-Admission (Discharged) from 08/16/2022 in [facility] ORTHOPEDICS (1A) Office Visit from 07/22/2022 in [facility]  C-SSRS RISK CATEGORY No Risk No Risk Error: Q3, 4, or 5 should not be populated when Q2 is No        Assessment and Plan:  LYA HOLBEN is a 73 y.o. 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   1. Bipolar affective disorder, currently depressed, moderate (HCC) R/o MDD with psychotic features  Acute stressors include: undergoing radiation treatment for breast caner s/p mastectomy Other stressors include:    History:  history of bipolar disorder (has this diagnosis according to her son, although she has no known manic symptoms), r/o early manifestation of neuropsychiatric symptoms secondary to underlying neurocognitive disorder.   The exam is notable for slight worsening in irritability, although she had less rumination on ego-syntonic thoughts of being antichrist, and somatic complaints.  According to her son, there has been a slight worsening in behavior disturbances today, which was also noted by the caregiver.  This is coincided with the recent discontinuation of sertraline  during admission, although it was restarted a few days ago.  Will continue current dose of sertraline  to target irritability.  Discussed potential risk of bleeding tendency.  Will continue quetiapine  for mood dysregulation. Noted that although she has a history of bipolar disorder, her son has only  observed symptoms of paranoia, and no known manic symptoms.   2. Insomnia, unspecified type Although she complains of initial and middle insomnia, the caregiver reports good sleep except she has occasional nocturia.   Will continue quetiapine  for insomnia.    R/o cognitive impairment Functional Status   IADL: Independent in the following: managing finances, medications, driving           Requires assistance with the following:  ADL  Independent in the following: bathing and hygiene, feeding, continence, grooming and toileting, walking          Requires assistance with the following: Folate, Vitamin B12 - wnl 04/2023, TSH Images Brain MRI 01/2022  Brain: Diffusion imaging does not show any acute or subacute infarction. No focal abnormality affects the brainstem or cerebellum. Cerebral hemispheres show subjectively the relative absence of volume loss. There are scattered foci of T2 and FLAIR signal within the white matter consistent with mild to low moderate chronic small-vessel ischemic change. No advanced disease. No cortical or large vessel territory infarction. No mass lesion, hemorrhage, hydrocephalus or extra-axial collection. No generalized or lobar specific volume loss. Moderate chronic small-vessel ischemic changes of the cerebral hemispheric white matter. No acute or reversible finding. Head CT 04/2023 Periventricular and scattered subcortical white matter hypoattenuation is mildly advanced for age. This likely reflects the sequela of chronic microvascular ischemia.             amyloid PET 05/2022.: Negative study indicating sparse or no amyloid neuritic plaque Neuropsych assessment: she was seen at [facility] clinic 12/2021. Dx r/o late onset alzheimer. MMSE Total Score 26/30 per chart. (MOCA 14 in 10/2021) Etiology:  Lewy body dementia, Alzheimer's disease, and/or depression Unchanged. I extensively reviewed chart outside of the system. PET scan was not conclusive for Alzheimer's disease.  It has been very difficult to tease out whether her current psychotic natures are only secondary to mood symptoms and or any component of neurodegenerative disorder.  The patient is not interested in neuropsych evaluation  or skin biopsy. Will continue to assess this.   # tachycardia She has tachycardia on today's exam, and reports mild dizziness.  The caregiver was advised to contact her primary care to address this.  Plan  Continue sertraline  100 mg daily (if she is amenable) Continue quetiapine  25 mg at night (QTc HR 73, QTC 427 msec 03/2022) Next appointment: 7/14 at 3:30, IP - consent form to have 2-way conversation with her son, [name] is on file.    The patient demonstrates the following risk factors for suicide: Chronic risk factors for suicide include: psychiatric disorder of depression . Acute risk factors for suicide include: unemployment, social withdrawal/isolation, and recent suicide attempt/discharges from inpatient psychiatry. Protective factors for this patient include: positive social support. Considering these factors, the overall suicide risk at this point appears to be moderate, but not at imminent risk. There are no guns in the house. Patient is appropriate for outpatient follow up.     Collaboration of Care: Collaboration of Care: Other reviewed notes in Epic, communicate with her son and her caregiver regarding the plans  Patient/Guardian was advised Release of Information must be obtained prior to any record release in order to collaborate their care with an outside provider. Patient/Guardian was advised if they have not already done so to contact the registration department to sign all necessary forms in order for us  to release information regarding their care.   Consent: Patient/Guardian gives verbal consent for treatment and assignment of benefits for  services provided during this visit. Patient/Guardian expressed understanding and agreed to proceed.    Todd Fossa, MD 06/12/2023, 5:31 PM

## 2023-06-12 ENCOUNTER — Encounter: Payer: Self-pay | Admitting: Psychiatry

## 2023-06-12 ENCOUNTER — Ambulatory Visit (INDEPENDENT_AMBULATORY_CARE_PROVIDER_SITE_OTHER): Admitting: Psychiatry

## 2023-06-12 VITALS — BP 118/64 | HR 125 | Temp 97.8°F | Ht 60.0 in | Wt 95.4 lb

## 2023-06-12 DIAGNOSIS — G4700 Insomnia, unspecified: Secondary | ICD-10-CM | POA: Diagnosis not present

## 2023-06-12 DIAGNOSIS — F3132 Bipolar disorder, current episode depressed, moderate: Secondary | ICD-10-CM | POA: Diagnosis not present

## 2023-06-12 NOTE — Patient Instructions (Signed)
 Continue sertraline  100 mg daily  Continue quetiapine  25 mg at night  Next appointment: 7/14 at 3:30

## 2023-06-23 ENCOUNTER — Encounter: Payer: Self-pay | Admitting: Oncology

## 2023-06-23 ENCOUNTER — Inpatient Hospital Stay: Attending: Oncology | Admitting: Oncology

## 2023-06-23 VITALS — BP 97/62 | HR 101 | Temp 98.3°F | Resp 20 | Ht 60.0 in | Wt 100.3 lb

## 2023-06-23 DIAGNOSIS — C50911 Malignant neoplasm of unspecified site of right female breast: Secondary | ICD-10-CM | POA: Diagnosis present

## 2023-06-23 DIAGNOSIS — Z803 Family history of malignant neoplasm of breast: Secondary | ICD-10-CM | POA: Diagnosis not present

## 2023-06-23 DIAGNOSIS — Z1721 Progesterone receptor positive status: Secondary | ICD-10-CM | POA: Diagnosis not present

## 2023-06-23 DIAGNOSIS — C787 Secondary malignant neoplasm of liver and intrahepatic bile duct: Secondary | ICD-10-CM | POA: Diagnosis not present

## 2023-06-23 DIAGNOSIS — Z90710 Acquired absence of both cervix and uterus: Secondary | ICD-10-CM | POA: Diagnosis not present

## 2023-06-23 DIAGNOSIS — Z1732 Human epidermal growth factor receptor 2 negative status: Secondary | ICD-10-CM | POA: Insufficient documentation

## 2023-06-23 DIAGNOSIS — I890 Lymphedema, not elsewhere classified: Secondary | ICD-10-CM | POA: Diagnosis not present

## 2023-06-23 DIAGNOSIS — Z87891 Personal history of nicotine dependence: Secondary | ICD-10-CM | POA: Diagnosis not present

## 2023-06-23 DIAGNOSIS — C7951 Secondary malignant neoplasm of bone: Secondary | ICD-10-CM | POA: Insufficient documentation

## 2023-06-23 DIAGNOSIS — Z170 Estrogen receptor positive status [ER+]: Secondary | ICD-10-CM | POA: Insufficient documentation

## 2023-06-23 NOTE — Progress Notes (Signed)
 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  INTERVAL HISTORY: Patient returns to clinic for hospital follow-up where CT scan noted widespread metastatic disease particularly in bone and liver.  She has a declining performance status, but otherwise feels well.  She does not complain of pain today.  She has no neurologic complaints.  She denies any fevers.  She has a good appetite and denies weight loss.  She has no chest pain, shortness of breath, cough, or hemoptysis.  She denies any nausea, vomiting, constipation, or diarrhea.  She has no urinary complaints.  Patient offers no further specific complaints today.  REVIEW OF SYSTEMS:   Review of Systems  Constitutional:  Positive for malaise/fatigue. Negative for fever and weight loss.  Respiratory: Negative.  Negative for cough, hemoptysis and shortness of breath.   Cardiovascular: Negative.  Negative for chest pain and leg swelling.  Gastrointestinal: Negative.  Negative for abdominal pain.  Genitourinary: Negative.  Negative for dysuria.  Musculoskeletal: Negative.  Negative for back pain.  Skin: Negative.  Negative for rash.  Neurological:  Positive for weakness. Negative for dizziness, sensory change, focal weakness and headaches.  Psychiatric/Behavioral:  The patient is not nervous/anxious.     As per HPI. Otherwise, a complete review of systems is negative.  PAST MEDICAL HISTORY: Past Medical History:  Diagnosis Date   Acute cholecystitis 2016   Anemia    Anemia 2016   Anxiety    panic attacks    Anxiety and depression    Arthritis    Bipolar 1 disorder (HCC)    Bowel obstruction (HCC) 2017   Common bile duct dilatation 2015   Dementia (HCC)    Dyspnea    Edema    Fibromyalgia    GI bleed 2017   History of kidney stones 01/21/2009   HSV-1 infection    Hyperglycemia    Hyperlipidemia    Hypokalemia    Hypothyroidism    Liver cyst    Malignant neoplasm of right female breast, unspecified estrogen receptor status, unspecified site of breast (HCC)    Memory loss    Osteopenia    Panic disorder    Schizophrenia (HCC)    Ventral hernia     PAST SURGICAL HISTORY: Past Surgical History:  Procedure Laterality Date   ABDOMINAL HYSTERECTOMY     APPENDECTOMY     BREAST BIOPSY Right 01/19/2021   US  Bx, ribbon, path pending   BREAST BIOPSY Right 01/19/2021   US  Bx, Venus, path pending   BREAST BIOPSY Right 01/19/2021   US  Bx, heart, path pending   BREAST BIOPSY Right 01/19/2021   US  BX Axilla, Coil, path pending   CESAREAN SECTION     CHOLECYSTECTOMY OPEN  11/17/2013   with repair of bile duct-Dr. Adele Admire   COLONOSCOPY N/A 05/20/2023   Procedure: COLONOSCOPY;  Surgeon: [name], MD;  Location: [facility];  Service: Endoscopy;  Laterality: N/A;   ENTEROSCOPY N/A 05/24/2023   Procedure: ENTEROSCOPY;  Surgeon: Toledo, Alphonsus Jeans, MD;  Location: [facility];  Service: Gastroenterology;  Laterality: N/A;   ESOPHAGOGASTRODUODENOSCOPY N/A 05/22/2023   Procedure: EGD (ESOPHAGOGASTRODUODENOSCOPY);  Surgeon:  [name], MD;  Location: [facility];  Service: Endoscopy;  Laterality: N/A;   GIVENS CAPSULE STUDY  05/22/2023   Procedure: IMAGING PROCEDURE, GI TRACT, INTRALUMINAL, VIA CAPSULE;  Surgeon: [name], MD;  Location: [facility];  Service: Endoscopy;;   MASTECTOMY MODIFIED RADICAL Right 03/02/2021   Procedure: MASTECTOMY MODIFIED RADICAL;  Surgeon: Conrado Delay, DO;  Location: [facility];  Service: General;  Laterality: Right;   OPEN REDUCTION INTERNAL FIXATION  (ORIF) DISTAL RADIAL FRACTURE Right 12/20/2014   Procedure: OPEN REDUCTION INTERNAL FIXATION (ORIF) RIGHT DISTAL RADIAL FRACTURE;  Surgeon: Saundra Curl, MD;  Location: [facility];  Service: Orthopedics;  Laterality: Right;   STOMACH SURGERY      FAMILY HISTORY: Family History  Problem Relation Age of Onset   Heart disease Father    Breast cancer Paternal Aunt    Kidney disease Paternal Aunt    Breast cancer Paternal Grandmother    Asthma Daughter    Bladder Cancer Neg Hx     ADVANCED DIRECTIVES (Y/N):  N  HEALTH MAINTENANCE: Social History   Tobacco Use   Smoking status: Former    Current packs/day: 0.00    Types: Cigarettes    Quit date: 11/03/1973    Years since quitting: 49.6    Passive exposure: Past   Smokeless tobacco: Never   Tobacco comments:    Smoked briefly as a teen- lives in secondhand smoke.   Vaping Use   Vaping status: Never Used  Substance Use Topics   Alcohol use: No    Alcohol/week: 0.0 standard drinks of alcohol   Drug use: No     Colonoscopy:  PAP:  Bone density:  Lipid panel:  Allergies  Allergen Reactions   Penicillins Hives    TOLERATED CEFAZOLIN     Current Outpatient Medications  Medication Sig Dispense Refill   acetaminophen  (TYLENOL ) 325 MG tablet Take 2 tablets (650 mg total) by mouth every 6 (six) hours as needed for mild pain, fever or headache.     alendronate  (FOSAMAX ) 70 MG tablet Take 1 tablet by mouth every Monday.     ferrous sulfate  325 (65 FE) MG tablet Take 1 tablet by mouth daily with breakfast.     letrozole  (FEMARA ) 2.5 MG tablet Take 1 tablet (2.5 mg total) by mouth daily. 90 tablet 3   levothyroxine  (SYNTHROID , LEVOTHROID) 88 MCG tablet Take 88 mcg by mouth daily before breakfast.     midodrine  (PROAMATINE ) 10 MG tablet Take 1 tablet (10 mg total) by mouth 3 (three) times daily with meals. 90 tablet 1   QUEtiapine  (SEROQUEL ) 25 MG tablet Take 1 tablet (25 mg total) by mouth at bedtime. 90 tablet  0   sertraline  (ZOLOFT ) 100 MG tablet Take 1 tablet (100 mg total) by mouth at bedtime. 90 tablet 0   vitamin A  3 MG (10000 UNITS) capsule Take 1 capsule (10,000 Units total) by mouth daily. 30 capsule 0   No current facility-administered medications for this visit.    OBJECTIVE: Vitals:   06/23/23 1459  BP: 97/62  Pulse: (!) 101  Resp: 20  Temp: 98.3 F (36.8 C)  SpO2: 100%     Body mass index is 19.59 kg/m.    ECOG FS:0 - Asymptomatic  General: Well-developed, well-nourished, no acute distress.  Sitting in a wheelchair. Eyes: Pink conjunctiva, anicteric sclera. HEENT: Normocephalic, moist mucous membranes. Lungs: No audible wheezing or coughing. Heart: Regular rate and rhythm. Abdomen: Soft, nontender, no obvious distention. Musculoskeletal: No edema, cyanosis, or  clubbing. Neuro: Alert, answering all questions appropriately. Cranial nerves grossly intact. Skin: No rashes or petechiae noted. Psych: Normal affect.  LAB RESULTS:  Lab Results  Component Value Date   NA 140 05/30/2023   K 3.8 05/30/2023   CL 105 05/30/2023   CO2 24 05/30/2023   GLUCOSE 90 05/30/2023   BUN 26 (H) 05/30/2023   CREATININE 0.83 05/30/2023   CALCIUM  8.3 (L) 05/30/2023   PROT 6.0 (L) 05/26/2023   ALBUMIN  1.9 (L) 05/26/2023   AST 97 (H) 05/26/2023   ALT 25 05/26/2023   ALKPHOS 321 (H) 05/26/2023   BILITOT 0.7 05/26/2023   GFRNONAA >60 05/30/2023   GFRAA 28 (L) 12/18/2015    Lab Results  Component Value Date   WBC 7.6 05/30/2023   NEUTROABS 6.1 05/26/2023   HGB 8.8 (L) 05/30/2023   HCT 28.0 (L) 05/30/2023   MCV 89.7 05/30/2023   PLT 367 05/30/2023     STUDIES: CT ABDOMEN PELVIS W CONTRAST Result Date: 05/29/2023 CLINICAL DATA:  Generalized abdominal pain. History of breast cancer. EXAM: CT ABDOMEN AND PELVIS WITH CONTRAST TECHNIQUE: Multidetector CT imaging of the abdomen and pelvis was performed using the standard protocol following bolus administration of intravenous contrast.  RADIATION DOSE REDUCTION: This exam was performed according to the departmental dose-optimization program which includes automated exposure control, adjustment of the mA and/or kV according to patient size and/or use of iterative reconstruction technique. CONTRAST:  OMNIPAQUE  IOHEXOL  300 MG/ML  SOLN COMPARISON:  January 29, 2022 FINDINGS: Lower chest: Minimal right pleural effusion is noted. Hepatobiliary: Status post cholecystectomy. Interval development of multiple irregular and peripherally enhancing lesions throughout the left and right hepatic lobes consistent with metastatic disease. The largest measures 3.2 cm in right hepatic lobe. Pancreas: Unremarkable. No pancreatic ductal dilatation or surrounding inflammatory changes. Spleen: Normal in size without focal abnormality. Adrenals/Urinary Tract: Adrenal glands appear normal. Stable left renal cyst. No hydronephrosis or renal obstruction is noted. Urinary bladder is unremarkable. Stomach/Bowel: Status post partial gastrectomy. Status post appendectomy. No evidence of bowel obstruction or inflammation. Vascular/Lymphatic: No significant vascular findings are present. No enlarged abdominal or pelvic lymph nodes. Reproductive: Status post hysterectomy. No adnexal masses. Other: No ascites or hernia. Musculoskeletal: Interval development of diffuse sclerotic densities throughout the July spine and pelvis concerning for osseous metastases. There is interval development of multiple mild compression deformities involving the visualized thoracic and lumbar spine most likely representing old pathologic fractures related to metastatic disease. No definite acute fracture is noted. IMPRESSION: Multiple hepatic metastatic lesions are noted. Diffuse osseous metastases are also noted with multiple old compression fractures seen in visualized thoracic and lumbar spine. Electronically Signed   By: Rosalene Colon M.D.   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  Final pathology reviewed and patient noted to have 11 out of 11 lymph nodes positive for disease.   Despite recommendations patient refused adjuvant chemotherapy, XRT, or letrozole  at that time. CT of the chest, abdomen, and pelvis completed on January 09, 2022 reviewed independently revealing only local recurrence of disease with possible right axillary lymph node involvement.  No metastatic disease was noted.  Recommended pursuing radiation therapy for local control disease. Patient was initially placed on letrozole  and was noncompliant with treatment.  Recent CT scan revealed widespread metastatic disease.  After lengthy discussion with the patient and her family, patient  wishes a referral to hospice.  No further follow-up or treatments are planned at this time.   Lymphedema: Chronic and unchanged.  Patient has lymphedema sleeve in place.  I spent a total of 30 minutes reviewing chart data, face-to-face evaluation with the patient, counseling and coordination of care as detailed above.   Patient expressed understanding and was in agreement with this plan. She also understands that She can call clinic at any time with any questions, concerns, or complaints.    Cancer Staging  Invasive ductal carcinoma of right breast [facility]) Staging form: Breast, AJCC 8th Edition - Pathologic stage from 03/14/2021: Stage IIIB (pT2, pN3a, cM0, G3, ER+, PR+, HER2-) - Signed by Shellie Dials, MD on 03/14/2021 Stage prefix: Initial diagnosis Histologic grading system: 3 grade system   Shellie Dials, MD   06/23/2023 4:44 PM

## 2023-07-03 ENCOUNTER — Other Ambulatory Visit: Payer: Self-pay

## 2023-07-04 ENCOUNTER — Other Ambulatory Visit: Payer: Self-pay

## 2023-07-07 ENCOUNTER — Encounter: Payer: Self-pay | Admitting: Oncology

## 2023-07-22 DEATH — deceased

## 2023-08-04 ENCOUNTER — Ambulatory Visit: Admitting: Psychiatry
